# Patient Record
Sex: Female | Born: 1955
Health system: Southern US, Community
[De-identification: ages and names within clinical notes are randomized; demographics above are authoritative.]

## PROBLEM LIST (undated history)

## (undated) DIAGNOSIS — F329 Major depressive disorder, single episode, unspecified: Secondary | ICD-10-CM

## (undated) DIAGNOSIS — H269 Unspecified cataract: Secondary | ICD-10-CM

## (undated) DIAGNOSIS — M1990 Unspecified osteoarthritis, unspecified site: Secondary | ICD-10-CM

## (undated) DIAGNOSIS — K219 Gastro-esophageal reflux disease without esophagitis: Secondary | ICD-10-CM

## (undated) DIAGNOSIS — K279 Peptic ulcer, site unspecified, unspecified as acute or chronic, without hemorrhage or perforation: Secondary | ICD-10-CM

## (undated) DIAGNOSIS — J439 Emphysema, unspecified: Secondary | ICD-10-CM

## (undated) DIAGNOSIS — R7303 Prediabetes: Secondary | ICD-10-CM

## (undated) DIAGNOSIS — J189 Pneumonia, unspecified organism: Secondary | ICD-10-CM

## (undated) DIAGNOSIS — F32A Depression, unspecified: Secondary | ICD-10-CM

## (undated) DIAGNOSIS — F419 Anxiety disorder, unspecified: Secondary | ICD-10-CM

## (undated) DIAGNOSIS — G709 Myoneural disorder, unspecified: Secondary | ICD-10-CM

## (undated) DIAGNOSIS — IMO0001 Reserved for inherently not codable concepts without codable children: Secondary | ICD-10-CM

## (undated) DIAGNOSIS — C189 Malignant neoplasm of colon, unspecified: Principal | ICD-10-CM

## (undated) DIAGNOSIS — D649 Anemia, unspecified: Secondary | ICD-10-CM

## (undated) HISTORY — DX: Gastro-esophageal reflux disease without esophagitis: K21.9

## (undated) HISTORY — DX: Prediabetes: R73.03

## (undated) HISTORY — DX: Anemia, unspecified: D64.9

## (undated) HISTORY — DX: Major depressive disorder, single episode, unspecified: F32.9

## (undated) HISTORY — DX: Depression, unspecified: F32.A

## (undated) HISTORY — DX: Malignant neoplasm of colon, unspecified: C18.9

## (undated) HISTORY — DX: Anxiety disorder, unspecified: F41.9

## (undated) HISTORY — DX: Unspecified cataract: H26.9

## (undated) HISTORY — PX: OTHER SURGICAL HISTORY: SHX169

## (undated) HISTORY — DX: Peptic ulcer, site unspecified, unspecified as acute or chronic, without hemorrhage or perforation: K27.9

## (undated) HISTORY — DX: Emphysema, unspecified: J43.9

## (undated) HISTORY — DX: Unspecified osteoarthritis, unspecified site: M19.90

## (undated) HISTORY — PX: COLONOSCOPY: SHX174

## (undated) HISTORY — PX: EYE SURGERY: SHX253

---

## 1980-07-15 HISTORY — PX: TUBAL LIGATION: SHX77

## 1998-02-19 ENCOUNTER — Emergency Department (HOSPITAL_COMMUNITY): Admission: EM | Admit: 1998-02-19 | Discharge: 1998-02-19 | Payer: Self-pay | Admitting: Emergency Medicine

## 1999-01-27 ENCOUNTER — Emergency Department (HOSPITAL_COMMUNITY): Admission: EM | Admit: 1999-01-27 | Discharge: 1999-01-27 | Payer: Self-pay | Admitting: Emergency Medicine

## 1999-01-27 ENCOUNTER — Encounter: Payer: Self-pay | Admitting: Emergency Medicine

## 1999-09-22 ENCOUNTER — Encounter: Payer: Self-pay | Admitting: Family Medicine

## 1999-09-22 ENCOUNTER — Ambulatory Visit (HOSPITAL_COMMUNITY): Admission: RE | Admit: 1999-09-22 | Discharge: 1999-09-22 | Payer: Self-pay | Admitting: Family Medicine

## 2000-12-11 ENCOUNTER — Encounter: Payer: Self-pay | Admitting: Family Medicine

## 2000-12-11 ENCOUNTER — Ambulatory Visit (HOSPITAL_COMMUNITY): Admission: RE | Admit: 2000-12-11 | Discharge: 2000-12-11 | Payer: Self-pay | Admitting: Family Medicine

## 2001-02-08 ENCOUNTER — Emergency Department (HOSPITAL_COMMUNITY): Admission: EM | Admit: 2001-02-08 | Discharge: 2001-02-08 | Payer: Self-pay | Admitting: *Deleted

## 2003-04-16 ENCOUNTER — Other Ambulatory Visit: Admission: RE | Admit: 2003-04-16 | Discharge: 2003-04-16 | Payer: Self-pay | Admitting: Obstetrics & Gynecology

## 2004-06-22 ENCOUNTER — Emergency Department (HOSPITAL_COMMUNITY): Admission: EM | Admit: 2004-06-22 | Discharge: 2004-06-22 | Payer: Self-pay | Admitting: Emergency Medicine

## 2005-03-24 ENCOUNTER — Other Ambulatory Visit: Admission: RE | Admit: 2005-03-24 | Discharge: 2005-03-24 | Payer: Self-pay | Admitting: Obstetrics and Gynecology

## 2006-04-19 ENCOUNTER — Encounter (INDEPENDENT_AMBULATORY_CARE_PROVIDER_SITE_OTHER): Payer: Self-pay | Admitting: *Deleted

## 2006-04-19 ENCOUNTER — Ambulatory Visit: Payer: Self-pay | Admitting: *Deleted

## 2007-03-22 ENCOUNTER — Other Ambulatory Visit: Admission: RE | Admit: 2007-03-22 | Discharge: 2007-03-22 | Payer: Self-pay | Admitting: Family Medicine

## 2007-04-02 ENCOUNTER — Ambulatory Visit: Payer: Self-pay | Admitting: Internal Medicine

## 2007-04-16 ENCOUNTER — Encounter: Payer: Self-pay | Admitting: Internal Medicine

## 2007-04-16 ENCOUNTER — Ambulatory Visit: Payer: Self-pay | Admitting: Internal Medicine

## 2007-11-15 HISTORY — PX: PLANTAR FASCIA SURGERY: SHX746

## 2008-04-23 ENCOUNTER — Emergency Department (HOSPITAL_COMMUNITY): Admission: EM | Admit: 2008-04-23 | Discharge: 2008-04-24 | Payer: Self-pay | Admitting: Emergency Medicine

## 2008-11-05 ENCOUNTER — Ambulatory Visit: Payer: Self-pay | Admitting: Obstetrics and Gynecology

## 2008-11-26 ENCOUNTER — Ambulatory Visit (HOSPITAL_COMMUNITY): Admission: RE | Admit: 2008-11-26 | Discharge: 2008-11-26 | Payer: Self-pay | Admitting: Obstetrics and Gynecology

## 2008-12-02 ENCOUNTER — Ambulatory Visit (HOSPITAL_COMMUNITY): Admission: RE | Admit: 2008-12-02 | Discharge: 2008-12-02 | Payer: Self-pay | Admitting: Obstetrics & Gynecology

## 2008-12-03 ENCOUNTER — Encounter: Admission: RE | Admit: 2008-12-03 | Discharge: 2008-12-03 | Payer: Self-pay | Admitting: Family Medicine

## 2010-11-14 HISTORY — PX: COLON RESECTION: SHX5231

## 2010-12-05 ENCOUNTER — Encounter: Payer: Self-pay | Admitting: *Deleted

## 2011-04-01 NOTE — Group Therapy Note (Signed)
NAMEJEFFIFER, RABOLD NO.:  [id number]   MEDICAL RECORD NO.:  [id number]          PATIENT TYPE:  WOC   LOCATION:  [facility]                   FACILITY:  [facility]   PHYSICIAN:  Ellis Parents, MD    DATE OF BIRTH:  [birth year]   DATE OF SERVICE:  04/19/2006                                    CLINIC NOTE   This 55 year old gravida 3, para 3, last menstrual period April 16, 2006,  previous menstrual period in January 2007, now presenting complaining of  night sweats of approximately 2 months' duration.  In the past 4 months she  has stopped smoking and has had a significant job disruption.  The hot  flashes are only at night.  There are no night sweats.  She has also sleep  insomnia.  The patient is taking some over-the-counter herbal medications  which she says have helped her a lot.   PAST MEDICAL HISTORY:  Negative.  She has been in good health.  Her last  mammogram was in January 2007.   PHYSICAL EXAMINATION:  External genitalia are normal.  The vagina is clean.  The cervix is well-epithelialized and normal.  The uterus is retroverted,  normal size and mobile.  Both adnexa are soft, without induration, masses or  nodularity.   These symptoms suggest menopausal symptoms but could be related to stress of  discontinuing smoking and job disruption.  The patient was advised that  hormonal medication can be used in circumstances like this if her symptoms  do not abate.  She is to return and we can discuss estrogen replacement  therapy.  A TSH is performed.  Pap smear is performed.           ______________________________  Ellis Parents, MD     SA/MEDQ  D:  04/19/2006  T:  04/20/2006  Job:  [id number]

## 2011-05-30 ENCOUNTER — Other Ambulatory Visit: Payer: Self-pay | Admitting: Gastroenterology

## 2011-05-31 ENCOUNTER — Ambulatory Visit
Admission: RE | Admit: 2011-05-31 | Discharge: 2011-05-31 | Disposition: A | Discharge status: Home / Self Care | Payer: 59 | Source: Ambulatory Visit | Attending: Gastroenterology | Admitting: Gastroenterology

## 2011-05-31 MED ORDER — IOHEXOL 300 MG/ML  SOLN
100.0000 mL | Freq: Once | INTRAMUSCULAR | Status: AC | PRN
  Administered 2011-05-31: 100 mL via INTRAVENOUS

## 2011-06-09 ENCOUNTER — Ambulatory Visit (INDEPENDENT_AMBULATORY_CARE_PROVIDER_SITE_OTHER): Payer: 59 | Admitting: General Surgery

## 2011-06-09 ENCOUNTER — Encounter (INDEPENDENT_AMBULATORY_CARE_PROVIDER_SITE_OTHER): Payer: Self-pay | Admitting: General Surgery

## 2011-06-09 DIAGNOSIS — M79609 Pain in unspecified limb: Secondary | ICD-10-CM

## 2011-06-09 DIAGNOSIS — C180 Malignant neoplasm of cecum: Secondary | ICD-10-CM

## 2011-06-09 DIAGNOSIS — M79662 Pain in left lower leg: Secondary | ICD-10-CM

## 2011-06-09 NOTE — Assessment & Plan Note (Signed)
Ordering BLE doppler studies to rule out DVT in this new cancer pt.

## 2011-06-09 NOTE — Progress Notes (Signed)
Teresa Morgan is a 55 y.o. female.    Chief Complaint  Patient presents with  . Other    eval colon mass    HPI HPI Pt presented with blood in stool at the beginning of July.  She had screening colonoscopy that was normal at age 41.  She denies weight loss or changes in her stool caliber or bowel habits.  She has had an occasional twinge of abdominal discomfort, but no abdominal pain.  She has not been fatigued or lightheaded.  She has been able to work.  She denies nausea or vomiting.  She does have a history of hemorrhoids, but these have not been inflamed.  She had a cousin who had stage IV colon cancer.   Past Medical History  Diagnosis Date  . Anemia     Past Surgical History  Procedure Date  . Tubal ligation   . Heel spurs   . Hemrrhoids     Family History  Problem Relation Age of Onset  . Diabetes Mother     Social History History  Substance Use Topics  . Smoking status: Former Smoker    Quit date: 06/08/2006  . Smokeless tobacco: Not on file  . Alcohol Use: Yes    Allergies  Allergen Reactions  . Aspirin     Upset stomach    Current Outpatient Prescriptions  Medication Sig Dispense Refill  . HYDROcodone-acetaminophen (LORTAB) 7.5-500 MG per tablet Take 1 tablet by mouth as needed.        . methocarbamol (ROBAXIN) 500 MG tablet Take 500 mg by mouth as needed.          Review of Systems Review of Systems  Constitutional: Negative.   HENT: Negative.   Eyes: Negative.   Respiratory: Negative.   Cardiovascular: Negative.   Gastrointestinal: Positive for blood in stool.  Genitourinary: Negative.   Musculoskeletal: Negative.   Skin: Negative.   Neurological: Negative.   Endo/Heme/Allergies: Negative.   Psychiatric/Behavioral: Negative.     Physical Exam Physical Exam  Constitutional: She is oriented to person, place, and time. She appears well-developed and well-nourished. No distress.  HENT:  Head: Normocephalic and atraumatic.  Mouth/Throat:  No oropharyngeal exudate.  Eyes: Conjunctivae are normal. Pupils are equal, round, and reactive to light. No scleral icterus.  Neck: Normal range of motion. Neck supple. No JVD present. No tracheal deviation present. No thyromegaly present.  Cardiovascular: Normal rate, regular rhythm and intact distal pulses.   Respiratory: No respiratory distress. She exhibits no tenderness.  GI: Soft. Bowel sounds are normal. She exhibits no distension and no mass. There is no tenderness. There is no rebound and no guarding.  Musculoskeletal: Normal range of motion. She exhibits no edema and no tenderness.  Lymphadenopathy:    She has no cervical adenopathy.  Neurological: She is alert and oriented to person, place, and time. Coordination normal.  Skin: Skin is warm and dry. No rash noted. She is not diaphoretic. No erythema. No pallor.  Psychiatric: She has a normal mood and affect. Her behavior is normal. Judgment and thought content normal.     Blood pressure 128/82, pulse 80, temperature 97 F (36.1 C), height 5\' 9"  (1.753 m), weight 159 lb 12.8 oz (72.485 kg).  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 Reviewed actual surgery and timing with placement of incisions,  etc. Reviewed need for pathology before determining if any chemotherapy is needed.   Pt has trip planned August 20.  Will do as soon as pt is back.    Bilateral calf pain Ordering BLE doppler studies to rule out DVT in this new cancer pt.      Gilbert Narain 06/09/2011, 4:56 PM

## 2011-06-09 NOTE — Assessment & Plan Note (Signed)
Laparoscopic R hemicolectomy Entereg protocol One day brief bowel prep Reviewed risks of surgery with bleeding, infection, damage to other structures, leak with possible ostomy, hernia, and possible prolonged hospital stay. Reviewed actual surgery and timing with placement of incisions, etc. Reviewed need for pathology before determining if any chemotherapy is needed.   Pt has trip planned August 20.  Will do as soon as pt is back.

## 2011-06-15 ENCOUNTER — Ambulatory Visit (HOSPITAL_COMMUNITY)
Admission: RE | Admit: 2011-06-15 | Discharge: 2011-06-15 | Disposition: A | Discharge status: Home / Self Care | Payer: 59 | Source: Ambulatory Visit | Attending: General Surgery | Admitting: General Surgery

## 2011-06-15 DIAGNOSIS — R252 Cramp and spasm: Secondary | ICD-10-CM | POA: Insufficient documentation

## 2011-06-15 DIAGNOSIS — M79609 Pain in unspecified limb: Secondary | ICD-10-CM | POA: Insufficient documentation

## 2011-06-27 ENCOUNTER — Other Ambulatory Visit (INDEPENDENT_AMBULATORY_CARE_PROVIDER_SITE_OTHER): Payer: Self-pay | Admitting: General Surgery

## 2011-06-27 ENCOUNTER — Ambulatory Visit (HOSPITAL_COMMUNITY)
Admission: RE | Admit: 2011-06-27 | Discharge: 2011-06-27 | Disposition: A | Discharge status: Home / Self Care | Payer: 59 | Source: Ambulatory Visit | Attending: General Surgery | Admitting: General Surgery

## 2011-06-27 ENCOUNTER — Encounter (HOSPITAL_COMMUNITY)
Admission: RE | Admit: 2011-06-27 | Discharge: 2011-06-27 | Disposition: A | Discharge status: Home / Self Care | Payer: 59 | Source: Ambulatory Visit | Attending: General Surgery | Admitting: General Surgery

## 2011-06-27 DIAGNOSIS — C189 Malignant neoplasm of colon, unspecified: Secondary | ICD-10-CM

## 2011-06-27 DIAGNOSIS — Z01818 Encounter for other preprocedural examination: Secondary | ICD-10-CM | POA: Insufficient documentation

## 2011-06-27 DIAGNOSIS — Z01812 Encounter for preprocedural laboratory examination: Secondary | ICD-10-CM | POA: Insufficient documentation

## 2011-06-27 LAB — CBC
MCH: 21.5 pg — ABNORMAL LOW (ref 26.0–34.0)
RBC: 5.11 MIL/uL (ref 3.87–5.11)
WBC: 6.1 10*3/uL (ref 4.0–10.5)

## 2011-06-27 LAB — TYPE AND SCREEN
ABO/RH(D): B POS
Antibody Screen: NEGATIVE

## 2011-06-27 LAB — SURGICAL PCR SCREEN
MRSA, PCR: NEGATIVE
Staphylococcus aureus: NEGATIVE

## 2011-06-27 LAB — COMPREHENSIVE METABOLIC PANEL
Albumin: 3.8 g/dL (ref 3.5–5.2)
BUN: 14 mg/dL (ref 6–23)
Creatinine, Ser: 0.71 mg/dL (ref 0.50–1.10)
Total Protein: 6.9 g/dL (ref 6.0–8.3)

## 2011-06-27 LAB — DIFFERENTIAL
Basophils Absolute: 0 10*3/uL (ref 0.0–0.1)
Eosinophils Absolute: 0.2 10*3/uL (ref 0.0–0.7)
Lymphocytes Relative: 26 % (ref 12–46)
Neutrophils Relative %: 65 % (ref 43–77)

## 2011-06-27 LAB — PROTIME-INR
INR: 1.03 (ref 0.00–1.49)
Prothrombin Time: 13.7 seconds (ref 11.6–15.2)

## 2011-07-07 ENCOUNTER — Other Ambulatory Visit (INDEPENDENT_AMBULATORY_CARE_PROVIDER_SITE_OTHER): Payer: Self-pay | Admitting: General Surgery

## 2011-07-07 ENCOUNTER — Inpatient Hospital Stay (HOSPITAL_COMMUNITY)
Admission: RE | Admit: 2011-07-07 | Discharge: 2011-07-11 | DRG: 330 | Disposition: A | Discharge status: Home / Self Care | Payer: 59 | Source: Ambulatory Visit | Attending: General Surgery | Admitting: General Surgery

## 2011-07-07 DIAGNOSIS — Z87891 Personal history of nicotine dependence: Secondary | ICD-10-CM

## 2011-07-07 DIAGNOSIS — D62 Acute posthemorrhagic anemia: Secondary | ICD-10-CM | POA: Diagnosis not present

## 2011-07-07 DIAGNOSIS — C180 Malignant neoplasm of cecum: Principal | ICD-10-CM | POA: Diagnosis present

## 2011-07-07 DIAGNOSIS — Z01818 Encounter for other preprocedural examination: Secondary | ICD-10-CM

## 2011-07-07 DIAGNOSIS — Z01812 Encounter for preprocedural laboratory examination: Secondary | ICD-10-CM

## 2011-07-07 DIAGNOSIS — Z01810 Encounter for preprocedural cardiovascular examination: Secondary | ICD-10-CM

## 2011-07-07 HISTORY — PX: APPENDECTOMY: SHX54

## 2011-07-08 LAB — CBC
MCH: 20.6 pg — ABNORMAL LOW (ref 26.0–34.0)
MCV: 66.4 fL — ABNORMAL LOW (ref 78.0–100.0)
Platelets: 127 10*3/uL — ABNORMAL LOW (ref 150–400)
RDW: 15.7 % — ABNORMAL HIGH (ref 11.5–15.5)

## 2011-07-08 LAB — BASIC METABOLIC PANEL
CO2: 27 mEq/L (ref 19–32)
Calcium: 8.2 mg/dL — ABNORMAL LOW (ref 8.4–10.5)
Creatinine, Ser: 0.64 mg/dL (ref 0.50–1.10)
GFR calc Af Amer: >60 mL/min (ref >60)

## 2011-07-08 LAB — MAGNESIUM: Magnesium: 2 mg/dL (ref 1.5–2.5)

## 2011-07-09 LAB — BASIC METABOLIC PANEL
BUN: 5 mg/dL — ABNORMAL LOW (ref 6–23)
Calcium: 8.6 mg/dL (ref 8.4–10.5)
Creatinine, Ser: 0.75 mg/dL (ref 0.50–1.10)
GFR calc non Af Amer: >60 mL/min (ref >60)
Glucose, Bld: 131 mg/dL — ABNORMAL HIGH (ref 70–99)
Sodium: 140 mEq/L (ref 135–145)

## 2011-07-09 LAB — CBC
Hemoglobin: 9.1 g/dL — ABNORMAL LOW (ref 12.0–15.0)
MCH: 21.2 pg — ABNORMAL LOW (ref 26.0–34.0)
MCHC: 31.7 g/dL (ref 30.0–36.0)

## 2011-07-13 ENCOUNTER — Telehealth (INDEPENDENT_AMBULATORY_CARE_PROVIDER_SITE_OTHER): Payer: Self-pay | Admitting: General Surgery

## 2011-07-13 NOTE — Telephone Encounter (Signed)
Discussed pathology w pt.  Pt is pT3N2.  Will need to see Dr. Myna Hidalgo for chemotherapy.  Have made referral to his office.  We will likely need to schedule port a cath.  Will let him see her and discuss before placing.

## 2011-07-14 ENCOUNTER — Telehealth (INDEPENDENT_AMBULATORY_CARE_PROVIDER_SITE_OTHER): Payer: Self-pay | Admitting: General Surgery

## 2011-07-14 NOTE — Op Note (Signed)
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  POSTOPERATIVE DIAGNOSIS:  Cecal adenocarcinoma, clinical T1-3 N0 M0.  PROCEDURE:  Laparoscopic hemicolectomy, On-Q pain pump.  SURGEON:  Almond Lint, MD  ASSISTANT:  Anselm Pancoast. Zachery Dakins, MD  ANESTHESIA:  General and local.  FINDINGS:  Cecal mass.  SPECIMEN:  Right colon to Pathology.  ESTIMATED BLOOD LOSS:  Minimal.  COMPLICATIONS:  None known.  PROCEDURE IN DETAIL:  [name] was identified in the holding area and taken to the operating room where she was placed on the operating room table.  General anesthesia was induced.  Her arms were tucked bilaterally.  Her abdomen was prepped and draped in sterile fashion. Time-out was performed according to the surgical safety check list. When all was correct, we continued.  The infraumbilical skin was anesthetized with local anesthetic and a vertical incision was made with a #11 blade.  The subcutaneous tissues were spread with a Kelly and the fascia was elevated with two Kocher clamps.  This was incised in the midline with a #11 blade.  A Tresa Endo was used to confirm entrance into the peritoneal cavity.  The pursestring suture was placed around the fascial incision with a 0 Vicryl.  Pneumoperitoneum was achieved to a pressure of 15 mmHg.  The patient was placed into Trendelenburg position and rotated to the left.  Two 5-mm trocars were placed in the midline, one in the lower midline and one in the upper midline.  This was after administration of local under direct visualization.  The appendix was visualized and the right colon.  There were some  adhesions of the appendix to the posterior abdominal wall.  These were taken down with enseal.  The colon was left medially.  There were quite rigid attachments to the retroperitoneum on the right were fairly filmy.  These were taken down with the enseal. The hepatic flexure was taken down with the enseal.  Care was taken to avoid injuring the mesocolon or the duodenum.  The omentum was also freed up off the proximal transverse colon.  Once this was adequately mobilized, a small incision was made incorporating the [facility] trocar and the lower midline trocar.  We made incision with #10 blade and the subcutaneous tissues and fascia were opened with cautery.  The Alexis wound protector was placed in the wound.  The colon was brought into the wound and was adequately immobilized.  Appropriate site of division was located at the terminal ileum and the transverse colon.  The colon was isolated from the mesentery and divided with a GIA 75 stapler.  A similar incision was done on the terminal ileum.  These were placed together and a stay suture was placed to hold the two ends either.  The tinea of colon was sutured to the small bowel  around 7 cm beyond where they were transected.  The ends of bowel were opened and the GIA was fired across this limb and the bowel to create a side-to-side functional end-to-end anastomosis.  Care was taken to make sure the bowel was not twisted and the mesentery was not incorporated into the staple line.  The staple line was then examined and was seen to be intact without evidence of bleeding.  The defect was then closed with the TA-60 stapler.  The mesenteric defect was reapproximated with interrupted 2-0 Vicryl figure- of-eight sutures.  This was allowed to fall back into the abdomen.  The abdomen was irrigated with saline.  The fascia was then closed with #0 loop PDS suture x2.  Pneumoperitoneum was reachieved, and there was no evidence of twisting or herniation of  the bowel and no evidence of blood pulling in any of the abdominal sites.  The skin was then irrigated. The On-Q tunnelers were placed.  They were placed in the preperitoneal space.  The skin of all the incisions was closed with 4-0 Monocryl in subcuticular fashion.  The On-Q catheters were advanced through the tunneler sheath and the tunneler sheaths were removed.  Catheters were bolused.  The wounds were dressed with benzoin and Steri-Strips and covered in a dressing.  The catheters were secured with Tegaderm.  The patient was awaken from anesthesia and taken to the PACU in stable condition.  Needle, sponge, and instrument counts were correct x2.     Almond Lint, MD     FB/MEDQ  D:  07/07/2011  T:  07/07/2011  Job:  [id number]  Electronically Signed by Almond Lint MD on 07/14/2011 02:34:20 PM

## 2011-07-15 ENCOUNTER — Other Ambulatory Visit (INDEPENDENT_AMBULATORY_CARE_PROVIDER_SITE_OTHER): Payer: Self-pay | Admitting: General Surgery

## 2011-07-15 ENCOUNTER — Telehealth (INDEPENDENT_AMBULATORY_CARE_PROVIDER_SITE_OTHER): Payer: Self-pay | Admitting: General Surgery

## 2011-07-15 DIAGNOSIS — C801 Malignant (primary) neoplasm, unspecified: Secondary | ICD-10-CM

## 2011-07-19 ENCOUNTER — Ambulatory Visit: Payer: 59 | Admitting: Hematology & Oncology

## 2011-07-27 ENCOUNTER — Other Ambulatory Visit (INDEPENDENT_AMBULATORY_CARE_PROVIDER_SITE_OTHER): Payer: Self-pay | Admitting: General Surgery

## 2011-07-27 ENCOUNTER — Telehealth (INDEPENDENT_AMBULATORY_CARE_PROVIDER_SITE_OTHER): Payer: Self-pay

## 2011-07-27 DIAGNOSIS — R52 Pain, unspecified: Secondary | ICD-10-CM

## 2011-07-27 DIAGNOSIS — R509 Fever, unspecified: Secondary | ICD-10-CM

## 2011-07-27 NOTE — Telephone Encounter (Signed)
Pt called c/o severe abdominal cramping and low grade fevers at night.  States the cramping lasted for approximately 24 hours ending yesterday, and she feels better today.  She is still having night fevers.  Dr. Derrell Lolling recommended a CBC w/ diff and UA w/ culture.  Pt will go for labwork today.

## 2011-07-28 LAB — CBC WITH DIFFERENTIAL/PLATELET

## 2011-07-28 LAB — URINALYSIS
Bilirubin Urine: NEGATIVE
Glucose, UA: NEGATIVE mg/dL
Ketones, ur: NEGATIVE mg/dL
Protein, ur: NEGATIVE mg/dL
Urobilinogen, UA: 0.2 mg/dL (ref 0.0–1.0)

## 2011-07-29 ENCOUNTER — Telehealth (INDEPENDENT_AMBULATORY_CARE_PROVIDER_SITE_OTHER): Payer: Self-pay

## 2011-07-29 LAB — URINE CULTURE: Organism ID, Bacteria: NO GROWTH

## 2011-07-29 NOTE — Telephone Encounter (Signed)
LMOM for pt to call for her lab results.  Informed her if she was no better to call the doctor on call this weekend, and I would check on her again Monday.  Her labs came back fairly normal, and were reviewed for me by Dr. Daphine Deutscher.

## 2011-08-01 NOTE — Discharge Summary (Signed)
  NAMEMATIKA, BARTELL                 ACCOUNT NO.:  [id number]  MEDICAL RECORD NO.:  [id number]  LOCATION:  5152                         FACILITY:  [facility]  PHYSICIAN:  Almond Lint, MD       DATE OF BIRTH:  1956-07-25  DATE OF ADMISSION:  07/07/2011 DATE OF DISCHARGE:  07/11/2011                              DISCHARGE SUMMARY   FINAL PRINCIPAL DIAGNOSIS:  Adenocarcinoma of the cecum, clinical T2, N0, Mx.  PRINCIPAL PROCEDURE:  Laparoscopic right hemicolectomy on July 07, 2011.  PRINCIPAL LABORATORIES:  Hematocrit prior to discharge is 28.7. Electrolytes showed a mildly elevated glucose of 119, otherwise essentially within normal limits.  DISCHARGE MEDICATIONS: 1. Fiber over-the-counter supplement. 2. Calcium carbonate/vitamin D. 3. Vitamin E. 4. Multivitamin. 5. Methocarbamol 500 mg every 6 hours as needed. 6. Hydrocodone/acetaminophen 7.5/325 every 6 hours as needed for pain.  HOSPITAL COURSE:  Ms. Piscopo was admitted to the hospital following her laparoscopic right hemicolectomy.  She did receive a bolus of lactated Ringers overnight for low blood pressure.  Her CBC did show that her hematocrit dropped somewhat down to 26.3.  She had a preexisting hematocrit around 33, so this was not a big drop.  This was watched. She had pain control with the PCA and On-Q pain pump.  Over the weekend, her bowel function improved on indirect protocol and she was able to be transitioned to a regular diet.  Her Foley catheter was removed without difficulty and she was able to void spontaneously.  She was able to ambulate without assistance.  She is discharged to home in improved condition.  We are still waiting for her pathology report at the time of discharge.  She is not to do any heavy lifting for 6-8 weeks.  She is to not drive for 2-4 weeks depending on her level of narcotics and comfort. She can eat as tolerated and should avoid constipation.     Almond Lint, MD     FB/MEDQ   D:  07/14/2011  T:  07/14/2011  Job:  [id number]  Electronically Signed by Almond Lint MD on 08/01/2011 07:38:30 AM

## 2011-08-02 ENCOUNTER — Ambulatory Visit (INDEPENDENT_AMBULATORY_CARE_PROVIDER_SITE_OTHER): Payer: 59 | Admitting: General Surgery

## 2011-08-02 VITALS — BP 98/66 | HR 68 | Temp 96.9°F | Resp 18 | Ht 69.0 in | Wt 158.8 lb

## 2011-08-02 DIAGNOSIS — C180 Malignant neoplasm of cecum: Secondary | ICD-10-CM

## 2011-08-02 MED ORDER — HYDROCODONE-ACETAMINOPHEN 7.5-500 MG PO TABS: 1.0000 | ORAL_TABLET | Freq: Four times a day (QID) | ORAL | Status: DC | PRN

## 2011-08-02 NOTE — Patient Instructions (Signed)
No aspirin products between now and port a cath placement.

## 2011-08-02 NOTE — Progress Notes (Signed)
HISTORY: Patient is doing very well.  Her energy level is returning to normal.  She had fevers last week and a U/A was performed which was negative.  CBC was not performed for unclear reasons.  In any event, she is feeling better now.  She denies nausea/vomiting.  She is no longer having fevers.  She denies issues with her bowel movements.     PERTINENT REVIEW OF SYSTEMS: Otherwise negative times 12 points.     EXAM: Head: Normocephalic and atraumatic.  Eyes:  Conjunctivae are normal. Pupils are equal, round, and reactive to light. No scleral icterus.  Neck:  Normal range of motion. Neck supple. No tracheal deviation present. No thyromegaly present.  Resp: No respiratory distress, normal effort. Abd: Soft. Incisions are well healed.  Abdomen is soft, non distended and non tender No masses are palpable.  There is no rebound and no guarding.  Neurological: Alert and oriented to person, place, and time. Coordination normal.  Skin: Skin is warm and dry. No rash noted. No diaphoretic. No erythema. No pallor.  Psychiatric: Normal mood and affect. Normal behavior. Judgment and thought content normal.     Pathology reviewed and demonstrates: See below. 4/17 nodes positive.  ASSESSMENT AND PLAN: Cecal cancer, pT3N2a Appt with Dr. Myna Hidalgo tomorrow. Schedule port a cath placement Follow up with me in 3 months otherwise.   Doing well after lap R colon.       Maudry Diego, MD Surgical Oncology, General & Endocrine Surgery [facility]  Provider Not In System Almond Lint, MD

## 2011-08-02 NOTE — Assessment & Plan Note (Signed)
Appt with Dr. Myna Hidalgo tomorrow. Schedule port a cath placement Follow up with me in 3 months otherwise.   Doing well after lap R colon.

## 2011-08-03 ENCOUNTER — Other Ambulatory Visit: Payer: Self-pay | Admitting: Hematology & Oncology

## 2011-08-03 ENCOUNTER — Encounter (HOSPITAL_BASED_OUTPATIENT_CLINIC_OR_DEPARTMENT_OTHER): Payer: 59 | Admitting: Hematology & Oncology

## 2011-08-03 DIAGNOSIS — C775 Secondary and unspecified malignant neoplasm of intrapelvic lymph nodes: Secondary | ICD-10-CM

## 2011-08-03 DIAGNOSIS — C180 Malignant neoplasm of cecum: Secondary | ICD-10-CM

## 2011-08-03 LAB — PREALBUMIN: Prealbumin: 24 mg/dL (ref 17.0–34.0)

## 2011-08-03 LAB — CBC WITH DIFFERENTIAL (CANCER CENTER ONLY)
BASO%: 0.2 % (ref 0.0–2.0)
Eosinophils Absolute: 0.1 10*3/uL (ref 0.0–0.5)
HCT: 33.1 % — ABNORMAL LOW (ref 34.8–46.6)
LYMPH%: 32.2 % (ref 14.0–48.0)
MCH: 20.6 pg — ABNORMAL LOW (ref 26.0–34.0)
MCV: 63 fL — ABNORMAL LOW (ref 81–101)
MONO%: 5.1 % (ref 0.0–13.0)
NEUT%: 60.1 % (ref 39.6–80.0)
Platelets: 261 10*3/uL (ref 145–400)
RDW: 16.2 % — ABNORMAL HIGH (ref 11.1–15.7)

## 2011-08-03 LAB — TECHNOLOGIST REVIEW CHCC SATELLITE

## 2011-08-03 LAB — COMPREHENSIVE METABOLIC PANEL
AST: 14 U/L (ref 0–37)
Alkaline Phosphatase: 72 U/L (ref 39–117)
BUN: 14 mg/dL (ref 6–23)
Glucose, Bld: 108 mg/dL — ABNORMAL HIGH (ref 70–99)
Sodium: 140 mEq/L (ref 135–145)
Total Bilirubin: 0.2 mg/dL — ABNORMAL LOW (ref 0.3–1.2)

## 2011-08-03 LAB — CEA: CEA: 1.1 ng/mL (ref 0.0–5.0)

## 2011-08-04 ENCOUNTER — Ambulatory Visit (HOSPITAL_COMMUNITY)
Admission: RE | Admit: 2011-08-04 | Discharge: 2011-08-04 | Disposition: A | Discharge status: Home / Self Care | Payer: 59 | Source: Ambulatory Visit | Attending: General Surgery | Admitting: General Surgery

## 2011-08-04 ENCOUNTER — Ambulatory Visit (HOSPITAL_COMMUNITY): Payer: 59

## 2011-08-04 DIAGNOSIS — C189 Malignant neoplasm of colon, unspecified: Secondary | ICD-10-CM

## 2011-08-04 LAB — CBC
MCH: 19.7 pg — ABNORMAL LOW (ref 26.0–34.0)
MCHC: 30.6 g/dL (ref 30.0–36.0)
Platelets: 182 10*3/uL (ref 150–400)
RBC: 5.34 MIL/uL — ABNORMAL HIGH (ref 3.87–5.11)

## 2011-08-04 LAB — SURGICAL PCR SCREEN
MRSA, PCR: NEGATIVE
Staphylococcus aureus: NEGATIVE

## 2011-08-10 ENCOUNTER — Encounter (HOSPITAL_BASED_OUTPATIENT_CLINIC_OR_DEPARTMENT_OTHER): Payer: 59 | Admitting: Hematology & Oncology

## 2011-08-10 DIAGNOSIS — Z5111 Encounter for antineoplastic chemotherapy: Secondary | ICD-10-CM

## 2011-08-10 DIAGNOSIS — C775 Secondary and unspecified malignant neoplasm of intrapelvic lymph nodes: Secondary | ICD-10-CM

## 2011-08-10 DIAGNOSIS — C180 Malignant neoplasm of cecum: Secondary | ICD-10-CM

## 2011-08-11 LAB — RAPID URINE DRUG SCREEN, HOSP PERFORMED
Barbiturates: NOT DETECTED
Benzodiazepines: NOT DETECTED
Cocaine: NOT DETECTED

## 2011-08-11 LAB — POCT I-STAT, CHEM 8
Creatinine, Ser: 1.1
Glucose, Bld: 125 — ABNORMAL HIGH
Hemoglobin: 14.3
TCO2: 26

## 2011-08-11 LAB — DIFFERENTIAL
Basophils Absolute: 0
Lymphocytes Relative: 25
Neutro Abs: 5.1
Neutrophils Relative %: 68

## 2011-08-11 LAB — CBC
Platelets: 181
RDW: 15.4

## 2011-08-11 LAB — ETHANOL: Alcohol, Ethyl (B): <5

## 2011-08-12 ENCOUNTER — Encounter (HOSPITAL_BASED_OUTPATIENT_CLINIC_OR_DEPARTMENT_OTHER): Payer: 59 | Admitting: Hematology & Oncology

## 2011-08-25 ENCOUNTER — Other Ambulatory Visit: Payer: Self-pay | Admitting: Hematology & Oncology

## 2011-08-25 ENCOUNTER — Encounter (HOSPITAL_BASED_OUTPATIENT_CLINIC_OR_DEPARTMENT_OTHER): Payer: 59 | Admitting: Hematology & Oncology

## 2011-08-25 DIAGNOSIS — C775 Secondary and unspecified malignant neoplasm of intrapelvic lymph nodes: Secondary | ICD-10-CM

## 2011-08-25 DIAGNOSIS — Z5111 Encounter for antineoplastic chemotherapy: Secondary | ICD-10-CM

## 2011-08-25 DIAGNOSIS — D649 Anemia, unspecified: Secondary | ICD-10-CM

## 2011-08-25 DIAGNOSIS — C180 Malignant neoplasm of cecum: Secondary | ICD-10-CM

## 2011-08-25 LAB — COMPREHENSIVE METABOLIC PANEL
AST: 15 U/L (ref 0–37)
Albumin: 4.1 g/dL (ref 3.5–5.2)
Alkaline Phosphatase: 74 U/L (ref 39–117)
BUN: 15 mg/dL (ref 6–23)
Creatinine, Ser: 0.76 mg/dL (ref 0.50–1.10)
Potassium: 4.2 mEq/L (ref 3.5–5.3)
Total Bilirubin: 0.2 mg/dL — ABNORMAL LOW (ref 0.3–1.2)

## 2011-08-25 LAB — IRON AND TIBC
%SAT: 12 % — ABNORMAL LOW (ref 20–55)
Iron: 40 ug/dL — ABNORMAL LOW (ref 42–145)
TIBC: 336 ug/dL (ref 250–470)
UIBC: 296 ug/dL (ref 125–400)

## 2011-08-25 LAB — CBC WITH DIFFERENTIAL (CANCER CENTER ONLY)
BASO#: 0 10*3/uL (ref 0.0–0.2)
EOS%: 3.2 % (ref 0.0–7.0)
HGB: 11 g/dL — ABNORMAL LOW (ref 11.6–15.9)
LYMPH%: 28.9 % (ref 14.0–48.0)
MCH: 20.3 pg — ABNORMAL LOW (ref 26.0–34.0)
MCHC: 32.9 g/dL (ref 32.0–36.0)
MCV: 62 fL — ABNORMAL LOW (ref 81–101)
MONO%: 8.5 % (ref 0.0–13.0)
NEUT#: 2.9 10*3/uL (ref 1.5–6.5)

## 2011-08-27 ENCOUNTER — Encounter (HOSPITAL_BASED_OUTPATIENT_CLINIC_OR_DEPARTMENT_OTHER): Payer: 59 | Admitting: Oncology

## 2011-08-30 ENCOUNTER — Encounter: Payer: Self-pay | Admitting: *Deleted

## 2011-08-30 NOTE — Op Note (Signed)
Teresa Morgan, Teresa Morgan                 ACCOUNT NO.:  [id number]  MEDICAL RECORD NO.:  [id number]  LOCATION:  DAYL                         FACILITY:  [facility]  PHYSICIAN:  Almond Lint, MD       DATE OF BIRTH:  09-23-1956  DATE OF PROCEDURE:  08/04/2011 DATE OF DISCHARGE:                              OPERATIVE REPORT   PREOPERATIVE DIAGNOSIS:  Colon cancer, G3, 2, and 0.  POSTOPERATIVE DIAGNOSIS:  Colon cancer, G3, 2, and 0.  PROCEDURE:  Port-A-Cath, left subclavian vein, Bard PowerPort, MRI safe, 8-French.  SURGEON:  Almond Lint, MD  ASSISTANT:  None.  ANESTHESIA:  General and local.  FINDINGS:  Good venous return and easy flush.  ESTIMATED BLOOD LOSS:  Minimal.  COMPLICATIONS:  None known.  PROCEDURE:  [name] was identified in the holding area and taken to operating room where she was placed supine on the operating room table. MAC anesthesia was induced.  Her chest and neck were prepped and draped in sterile fashion.  A shoulder roll had been placed previously.  She was placed into Trendelenburg position.  Time-out was performed according to the surgical safety check list.  When all was correct, we continued.  The infraclavicular skin was anesthetized with local anesthetic.  The subclavian vein was accessed with one stick.  The wire threaded easily without ectopy.  Fluoroscopy was used to confirm entrance into the venous system.  A port site was selected, slightly inferiorly and this was anesthetized liberally with local anesthetic.   A 3 cm incision was made transversely with #15 blade.  The subcutaneous tissues were divided with cautery.  This was taken down to the pectoralis fascia.  A pocket was created by elevating the skin and subcutaneous tissues with the [employer].  The port was then placed into the pocket to make sure there is adequate space.  The catheter had been pre-attached to the port.  It also had been flush.  Four 2-0 Prolene sutures were used to secure  the port down to the fascia.  These were not tied down at that point.  Fluoroscopy was used to compare the catheter against the wire and select an appropriate length.  This was transected at 24 cm.  Under fluoroscopic guidance, the dilator and tunneler sheath were advanced over the wire into the subclavian vein.  The dilator and wire were removed, and the catheter was advanced through the tunneler sheath.  The tunneler sheath was pulled away.  There is good venous return and easy flush of the port.  The sutures were then tied down. The 3-0 Vicryl was used to close the subcutaneous skin in interrupted deep dermal lights.  Fluoroscopy was used to reassess length of the catheter and it appeared to be at the SVC-RA junction.  The skin was then closed using 4-0 Monocryl in subcuticular fashion.  Concentrated flush was then administered in a volume of 4 cc.  Again, there was good venous return and easy flush.  The skin was cleaned, dried, and dressed with Dermabond. The catheter was left un-accessed as the patient did not have chemotherapy for around a week.  The patient tolerated the procedure well.  She  was allowed to emerge from anesthesia and taken to PACU in stable condition.  Needle, sponge, and instrument counts were correct.     Almond Lint, MD     FB/MEDQ  D:  08/04/2011  T:  08/04/2011  Job:  [id number]  Electronically Signed by Almond Lint MD on 08/30/2011 11:57:49 AM

## 2011-09-07 ENCOUNTER — Encounter: Payer: Self-pay | Admitting: *Deleted

## 2011-09-08 ENCOUNTER — Other Ambulatory Visit: Payer: Self-pay | Admitting: Hematology & Oncology

## 2011-09-08 ENCOUNTER — Encounter (HOSPITAL_BASED_OUTPATIENT_CLINIC_OR_DEPARTMENT_OTHER): Payer: 59 | Admitting: Hematology & Oncology

## 2011-09-08 DIAGNOSIS — C180 Malignant neoplasm of cecum: Secondary | ICD-10-CM

## 2011-09-08 DIAGNOSIS — Z5111 Encounter for antineoplastic chemotherapy: Secondary | ICD-10-CM

## 2011-09-08 DIAGNOSIS — C775 Secondary and unspecified malignant neoplasm of intrapelvic lymph nodes: Secondary | ICD-10-CM

## 2011-09-08 LAB — COMPREHENSIVE METABOLIC PANEL
ALT: 14 U/L (ref 0–35)
Albumin: 4.3 g/dL (ref 3.5–5.2)
CO2: 20 mEq/L (ref 19–32)
Glucose, Bld: 127 mg/dL — ABNORMAL HIGH (ref 70–99)
Potassium: 4 mEq/L (ref 3.5–5.3)
Sodium: 140 mEq/L (ref 135–145)
Total Bilirubin: 0.4 mg/dL (ref 0.3–1.2)
Total Protein: 6.9 g/dL (ref 6.0–8.3)

## 2011-09-08 LAB — CBC WITH DIFFERENTIAL (CANCER CENTER ONLY)
BASO%: 0.4 % (ref 0.0–2.0)
Eosinophils Absolute: 0.2 10*3/uL (ref 0.0–0.5)
LYMPH#: 1.2 10*3/uL (ref 0.9–3.3)
MONO#: 0.5 10*3/uL (ref 0.1–0.9)
NEUT#: 2.9 10*3/uL (ref 1.5–6.5)
Platelets: 152 10*3/uL (ref 145–400)
RBC: 5.18 10*6/uL (ref 3.70–5.32)
RDW: 21.6 % — ABNORMAL HIGH (ref 11.1–15.7)
WBC: 4.8 10*3/uL (ref 3.9–10.0)

## 2011-09-10 ENCOUNTER — Encounter: Payer: 59 | Admitting: Oncology

## 2011-09-21 ENCOUNTER — Other Ambulatory Visit: Payer: Self-pay | Admitting: Hematology & Oncology

## 2011-09-21 DIAGNOSIS — C180 Malignant neoplasm of cecum: Secondary | ICD-10-CM

## 2011-09-22 ENCOUNTER — Ambulatory Visit (HOSPITAL_BASED_OUTPATIENT_CLINIC_OR_DEPARTMENT_OTHER): Payer: 59 | Admitting: Hematology & Oncology

## 2011-09-22 ENCOUNTER — Other Ambulatory Visit (HOSPITAL_BASED_OUTPATIENT_CLINIC_OR_DEPARTMENT_OTHER): Payer: 59 | Admitting: Lab

## 2011-09-22 ENCOUNTER — Other Ambulatory Visit: Payer: Self-pay | Admitting: Hematology & Oncology

## 2011-09-22 ENCOUNTER — Ambulatory Visit (HOSPITAL_BASED_OUTPATIENT_CLINIC_OR_DEPARTMENT_OTHER): Payer: 59

## 2011-09-22 DIAGNOSIS — C189 Malignant neoplasm of colon, unspecified: Secondary | ICD-10-CM

## 2011-09-22 DIAGNOSIS — C180 Malignant neoplasm of cecum: Secondary | ICD-10-CM

## 2011-09-22 DIAGNOSIS — C775 Secondary and unspecified malignant neoplasm of intrapelvic lymph nodes: Secondary | ICD-10-CM

## 2011-09-22 DIAGNOSIS — Z5111 Encounter for antineoplastic chemotherapy: Secondary | ICD-10-CM

## 2011-09-22 LAB — CBC WITH DIFFERENTIAL (CANCER CENTER ONLY)
BASO%: 0.6 % (ref 0.0–2.0)
Eosinophils Absolute: 0.1 10*3/uL (ref 0.0–0.5)
LYMPH%: 31.7 % (ref 14.0–48.0)
MCH: 20.6 pg — ABNORMAL LOW (ref 26.0–34.0)
MCV: 63 fL — ABNORMAL LOW (ref 81–101)
MONO%: 11.2 % (ref 0.0–13.0)
NEUT#: 1.9 10*3/uL (ref 1.5–6.5)
Platelets: 119 10*3/uL — ABNORMAL LOW (ref 145–400)
RBC: 4.65 10*6/uL (ref 3.70–5.32)
RDW: 24.4 % — ABNORMAL HIGH (ref 11.1–15.7)
WBC: 3.6 10*3/uL — ABNORMAL LOW (ref 3.9–10.0)

## 2011-09-22 LAB — COMPREHENSIVE METABOLIC PANEL
Albumin: 4 g/dL (ref 3.5–5.2)
Alkaline Phosphatase: 64 U/L (ref 39–117)
CO2: 24 mEq/L (ref 19–32)
Glucose, Bld: 93 mg/dL (ref 70–99)
Potassium: 3.7 mEq/L (ref 3.5–5.3)
Sodium: 140 mEq/L (ref 135–145)
Total Protein: 6.4 g/dL (ref 6.0–8.3)

## 2011-09-22 MED ORDER — SODIUM CHLORIDE 0.9 % IJ SOLN
10.0000 mL | INTRAMUSCULAR | Status: DC | PRN
  Filled 2011-09-22: qty 10

## 2011-09-22 MED ORDER — LEUCOVORIN CALCIUM INJECTION 350 MG
400.0000 mg/m2 | Freq: Once | INTRAVENOUS | Status: AC
  Administered 2011-09-22: 752 mg via INTRAVENOUS
  Filled 2011-09-22: qty 37.6

## 2011-09-22 MED ORDER — DEXTROSE 5 % IV SOLN
Freq: Once | INTRAVENOUS | Status: AC
  Administered 2011-09-22: 10:00:00 via INTRAVENOUS

## 2011-09-22 MED ORDER — ONDANSETRON 8 MG/50ML IVPB (CHCC)
8.0000 mg | Freq: Once | INTRAVENOUS | Status: AC
  Administered 2011-09-22: 8 mg via INTRAVENOUS
  Filled 2011-09-22: qty 8

## 2011-09-22 MED ORDER — SODIUM CHLORIDE 0.9 % IV SOLN
Freq: Once | INTRAVENOUS | Status: AC
  Administered 2011-09-22: 11:00:00 via INTRAVENOUS
  Filled 2011-09-22: qty 10

## 2011-09-22 MED ORDER — FLUOROURACIL CHEMO INJECTION 5 GM/100ML
2400.0000 mg/m2 | INTRAVENOUS | Status: DC
  Administered 2011-09-22: 4500 mg via INTRAVENOUS
  Filled 2011-09-22: qty 90

## 2011-09-22 MED ORDER — DEXAMETHASONE SODIUM PHOSPHATE 10 MG/ML IJ SOLN
10.0000 mg | Freq: Once | INTRAMUSCULAR | Status: AC
  Administered 2011-09-22: 10 mg via INTRAVENOUS

## 2011-09-22 MED ORDER — FLUOROURACIL CHEMO INJECTION 2.5 GM/50ML
400.0000 mg/m2 | Freq: Once | INTRAVENOUS | Status: AC
  Administered 2011-09-22: 750 mg via INTRAVENOUS
  Filled 2011-09-22: qty 15

## 2011-09-22 MED ORDER — HEPARIN SOD (PORK) LOCK FLUSH 100 UNIT/ML IV SOLN
500.0000 [IU] | Freq: Once | INTRAVENOUS | Status: DC | PRN
  Filled 2011-09-22: qty 5

## 2011-09-22 MED ORDER — SODIUM CHLORIDE 0.9 % IV SOLN
Freq: Once | INTRAVENOUS | Status: AC
  Administered 2011-09-22: 14:00:00 via INTRAVENOUS
  Filled 2011-09-22: qty 10

## 2011-09-22 MED ORDER — OXALIPLATIN CHEMO INJECTION 100 MG/20ML
85.0000 mg/m2 | Freq: Once | INTRAVENOUS | Status: AC
  Administered 2011-09-22: 160 mg via INTRAVENOUS
  Filled 2011-09-22: qty 32

## 2011-09-22 NOTE — Progress Notes (Signed)
CSN: [id number] This office note has been dictated. ENNEVER,PETER R 09/22/2011

## 2011-09-22 NOTE — Progress Notes (Signed)
CC:   Teresa Elsie Amis, MD, Teresa Bouillon, MD  DIAGNOSIS:  Stage IIIB (T2 N2a M0) adenocarcinoma of the cecum.  CURRENT THERAPY:  The patient is status post 2 cycles of FOLFOX.  INTERIM HISTORY:  Teresa Morgan comes in for followup.  She is doing well with her chemotherapy.  She has lost a little hair.  She has had no nausea or vomiting.  She has had some abdominal discomfort.  This may be from where she had her past surgery.  There has been no change in bowel or bladder habits.  She has had no rashes.  There has been no bleeding.  There has been no fevers, sweats, or CHILLS.  PHYSICAL EXAMINATION:  General Appearance:  This is a well-developed, well-nourished, African American female in no obvious distress.  Vital Signs:  97, pulse 71, respiratory rate 18, blood pressure 130/85, weight is 163.  Head Exam:  A normocephalic, atraumatic skull.  There are no ocular or oral lesions.  Neck:  There are no palpable cervical or supraclavicular lymph nodes.  Lungs:  Clear bilaterally.  Cardiac Exam: Regular rate and rhythm with a normal S1, S2.  There are no murmurs, rubs, or bruits.  Abdominal Exam:  Soft with good bowel sounds.  She has a well-healed laparoscopy scar.  There is no guarding or rebound tenderness.  There is no palpable hepatosplenomegaly.  Back Exam:  No tenderness over the spine, ribs, or hips.  Extremities:  No clubbing, cyanosis, or edema.  Neurological Exam:  No focal neurological deficits.  LABORATORY STUDIES:  White cell count 3.6, hemoglobin 9.6, hematocrit 29.5, platelet count 119.  IMPRESSION:  Teresa Morgan is a 55 year old Filipino/African American female with stage IIIB adenocarcinoma of the cecum.  She had a laparoscopic resection.  She had IV positive lymph nodes.  She is on adjuvant chemotherapy.  She will continue to be on therapy every 2 weeks.  We will go ahead and plan to get her back in 2 more weeks.  I want to try and stay on schedule as much as  possible.    ______________________________ Josph Macho, M.D. PRE/MEDQ  D:  09/22/2011  T:  09/22/2011  Job:  [id number]

## 2011-09-24 ENCOUNTER — Ambulatory Visit (HOSPITAL_BASED_OUTPATIENT_CLINIC_OR_DEPARTMENT_OTHER): Payer: 59

## 2011-09-24 VITALS — BP 136/76 | HR 78 | Temp 96.9°F

## 2011-09-24 DIAGNOSIS — C775 Secondary and unspecified malignant neoplasm of intrapelvic lymph nodes: Secondary | ICD-10-CM

## 2011-09-24 DIAGNOSIS — C180 Malignant neoplasm of cecum: Secondary | ICD-10-CM

## 2011-09-24 MED ORDER — SODIUM CHLORIDE 0.9 % IJ SOLN
10.0000 mL | INTRAMUSCULAR | Status: DC | PRN
  Administered 2011-09-24: 10 mL
  Filled 2011-09-24: qty 10

## 2011-09-24 MED ORDER — HEPARIN SOD (PORK) LOCK FLUSH 100 UNIT/ML IV SOLN
500.0000 [IU] | Freq: Once | INTRAVENOUS | Status: AC | PRN
  Administered 2011-09-24: 500 [IU]
  Filled 2011-09-24: qty 5

## 2011-10-04 ENCOUNTER — Other Ambulatory Visit: Payer: Self-pay | Admitting: Hematology & Oncology

## 2011-10-05 ENCOUNTER — Ambulatory Visit (HOSPITAL_BASED_OUTPATIENT_CLINIC_OR_DEPARTMENT_OTHER): Payer: 59 | Admitting: Family

## 2011-10-05 ENCOUNTER — Other Ambulatory Visit: Payer: 59 | Admitting: Lab

## 2011-10-05 ENCOUNTER — Ambulatory Visit: Payer: 59

## 2011-10-05 ENCOUNTER — Encounter: Payer: Self-pay | Admitting: Family

## 2011-10-05 ENCOUNTER — Ambulatory Visit (HOSPITAL_BASED_OUTPATIENT_CLINIC_OR_DEPARTMENT_OTHER): Payer: 59 | Admitting: Lab

## 2011-10-05 ENCOUNTER — Other Ambulatory Visit: Payer: Self-pay | Admitting: Hematology & Oncology

## 2011-10-05 ENCOUNTER — Ambulatory Visit (HOSPITAL_BASED_OUTPATIENT_CLINIC_OR_DEPARTMENT_OTHER): Payer: 59

## 2011-10-05 VITALS — BP 125/81 | HR 84 | Temp 98.6°F | Ht 68.0 in | Wt 164.0 lb

## 2011-10-05 DIAGNOSIS — C180 Malignant neoplasm of cecum: Secondary | ICD-10-CM

## 2011-10-05 DIAGNOSIS — Z5111 Encounter for antineoplastic chemotherapy: Secondary | ICD-10-CM

## 2011-10-05 DIAGNOSIS — C189 Malignant neoplasm of colon, unspecified: Secondary | ICD-10-CM

## 2011-10-05 DIAGNOSIS — R1013 Epigastric pain: Secondary | ICD-10-CM

## 2011-10-05 DIAGNOSIS — M79661 Pain in right lower leg: Secondary | ICD-10-CM

## 2011-10-05 DIAGNOSIS — K3189 Other diseases of stomach and duodenum: Secondary | ICD-10-CM

## 2011-10-05 LAB — COMPREHENSIVE METABOLIC PANEL
ALT: 21 U/L (ref 0–35)
AST: 26 U/L (ref 0–37)
Albumin: 4 g/dL (ref 3.5–5.2)
Alkaline Phosphatase: 70 U/L (ref 39–117)
Glucose, Bld: 113 mg/dL — ABNORMAL HIGH (ref 70–99)
Potassium: 4.2 mEq/L (ref 3.5–5.3)
Sodium: 144 mEq/L (ref 135–145)
Total Protein: 6.2 g/dL (ref 6.0–8.3)

## 2011-10-05 LAB — TECHNOLOGIST REVIEW CHCC SATELLITE

## 2011-10-05 LAB — CBC WITH DIFFERENTIAL (CANCER CENTER ONLY)
BASO%: 0.5 % (ref 0.0–2.0)
Eosinophils Absolute: 0.2 10*3/uL (ref 0.0–0.5)
MCH: 21.1 pg — ABNORMAL LOW (ref 26.0–34.0)
MONO%: 9.6 % (ref 0.0–13.0)
NEUT#: 2.7 10*3/uL (ref 1.5–6.5)
Platelets: 86 10*3/uL — ABNORMAL LOW (ref 145–400)
RBC: 4.65 10*6/uL (ref 3.70–5.32)
RDW: 26.7 % — ABNORMAL HIGH (ref 11.1–15.7)
WBC: 4.3 10*3/uL (ref 3.9–10.0)

## 2011-10-05 MED ORDER — SODIUM CHLORIDE 0.9 % IV SOLN
Freq: Once | INTRAVENOUS | Status: AC
  Administered 2011-10-05: 16:00:00 via INTRAVENOUS
  Filled 2011-10-05: qty 10

## 2011-10-05 MED ORDER — SODIUM CHLORIDE 0.9 % IV SOLN
2400.0000 mg/m2 | INTRAVENOUS | Status: DC
  Administered 2011-10-05: 4500 mg via INTRAVENOUS
  Filled 2011-10-05: qty 90

## 2011-10-05 MED ORDER — FLUOROURACIL CHEMO INJECTION 2.5 GM/50ML
400.0000 mg/m2 | Freq: Once | INTRAVENOUS | Status: AC
  Administered 2011-10-05: 750 mg via INTRAVENOUS
  Filled 2011-10-05: qty 15

## 2011-10-05 MED ORDER — DEXTROSE 5 % IV SOLN
Freq: Once | INTRAVENOUS | Status: AC
  Administered 2011-10-05: 11:00:00 via INTRAVENOUS

## 2011-10-05 MED ORDER — LEUCOVORIN CALCIUM INJECTION 350 MG
400.0000 mg/m2 | Freq: Once | INTRAVENOUS | Status: AC
  Administered 2011-10-05: 752 mg via INTRAVENOUS
  Filled 2011-10-05: qty 37.6

## 2011-10-05 MED ORDER — PALONOSETRON HCL INJECTION 0.25 MG/5ML
0.2500 mg | Freq: Once | INTRAVENOUS | Status: AC
  Administered 2011-10-05: 0.25 mg via INTRAVENOUS

## 2011-10-05 MED ORDER — OXALIPLATIN CHEMO INJECTION 100 MG/20ML
85.0000 mg/m2 | Freq: Once | INTRAVENOUS | Status: AC
  Administered 2011-10-05: 160 mg via INTRAVENOUS
  Filled 2011-10-05: qty 32

## 2011-10-05 MED ORDER — BELLADONNA ALK-PHENOBARBITAL 16.2 MG PO TABS: 1.0000 | ORAL_TABLET | Freq: Four times a day (QID) | ORAL | Status: DC | PRN

## 2011-10-05 MED ORDER — SODIUM CHLORIDE 0.9 % IV SOLN
Freq: Once | INTRAVENOUS | Status: AC
  Administered 2011-10-05: 12:00:00 via INTRAVENOUS
  Filled 2011-10-05: qty 10

## 2011-10-05 MED ORDER — FAMOTIDINE IN NACL 20-0.9 MG/50ML-% IV SOLN
20.0000 mg | Freq: Once | INTRAVENOUS | Status: AC
  Administered 2011-10-05: 20 mg via INTRAVENOUS

## 2011-10-05 MED ORDER — DEXAMETHASONE SODIUM PHOSPHATE 10 MG/ML IJ SOLN
10.0000 mg | Freq: Once | INTRAMUSCULAR | Status: AC
  Administered 2011-10-05: 10 mg via INTRAVENOUS

## 2011-10-05 NOTE — Progress Notes (Signed)
DIAGNOSIS: Patient Active Problem List  Diagnoses Date Noted  . Cecal cancer, pT3N2a 06/09/2011  . Bilateral calf pain 06/09/2011     Encounter Diagnosis  Name Primary?  . Colon cancer Yes    CURRENT THERAPY: FOLFOX  INTERIM HISTORY: pt here for scheduled follow-up and chemotherapy. Has had transient nausea, worse day 3 after chemotherapy. Uses antiemetics as needed. Experiencing heartburn, also day 3 after chemo. Takes Zantac.  No SOB, no cough, no abdominal pain. No headache or blurred vision.   PHYSICAL EXAM: BP 125/81  Pulse 84  Temp(Src) 98.6 F (37 C) (Oral)  Ht 5\' 8"  (1.727 m)  Wt 164 lb (74.39 kg)  BMI 24.94 kg/m2 General: Well developed, well nourished, in no acute distress.  EENT: No ocular or oral lesions. No stomatitis.  Respiratory: Lungs are clear to auscultation bilaterally with normal respiratory movement and no accessory muscle use. Cardiac: No murmur, rub or tachycardia. No upper or lower extremity edema.  GI: Abdomen is soft, no palpable hepatosplenomegaly. No fluid wave. No tenderness. Musculoskeletal: No kyphosis, no tenderness over the spine, ribs or hips. Lymph: No cervical, infraclavicular, axillary or inguinal adenopathy. Neuro: No focal neurological deficits. Psych: Alert and oriented X 3, appropriate mood and affect.   LABORATORY STUDIES:   Results for orders placed in visit on 10/05/11  TECHNOLOGIST REVIEW [facility]      Component Value Range   Tech Review Few teardrops, helmets and fragments     CBC    Component Value Date/Time   WBC 4.3 10/05/2011 0936   WBC 4.9 08/04/2011 0915   RBC 5.34* 08/04/2011 0915   HGB 9.8* 10/05/2011 0936   HGB 10.5* 08/04/2011 0915   HCT 30.0* 10/05/2011 0936   HCT 34.3* 08/04/2011 0915   PLT 86* 10/05/2011 0936   PLT 182 08/04/2011 0915   MCV 65* 10/05/2011 0936   MCV 64.2* 08/04/2011 0915   MCH 19.7* 08/04/2011 0915   MCHC 30.6 08/04/2011 0915   RDW 15.7* 08/04/2011 0915   LYMPHSABS TEST NOT PERFORMED  07/27/2011 0000   MONOABS TEST NOT PERFORMED 07/27/2011 0000   EOSABS 0.2 10/05/2011 0936   EOSABS TEST NOT PERFORMED 07/27/2011 0000   BASOSABS 0.0 10/05/2011 0936   BASOSABS TEST NOT PERFORMED 07/27/2011 0000     IMPRESSION:  1. Cecal cancer, receiving chemotherapy. 2. Transient nausea after chemo 3. Dyspepsia following chemo.   PLAN:  Treatment today. Will add Pepcid IV to treatment.

## 2011-10-07 ENCOUNTER — Telehealth: Payer: Self-pay | Admitting: *Deleted

## 2011-10-07 ENCOUNTER — Other Ambulatory Visit: Payer: Self-pay | Admitting: Hematology & Oncology

## 2011-10-07 ENCOUNTER — Ambulatory Visit (HOSPITAL_BASED_OUTPATIENT_CLINIC_OR_DEPARTMENT_OTHER)

## 2011-10-07 DIAGNOSIS — Z452 Encounter for adjustment and management of vascular access device: Secondary | ICD-10-CM

## 2011-10-07 DIAGNOSIS — C180 Malignant neoplasm of cecum: Secondary | ICD-10-CM

## 2011-10-07 MED ORDER — HEPARIN SOD (PORK) LOCK FLUSH 100 UNIT/ML IV SOLN
500.0000 [IU] | Freq: Once | INTRAVENOUS | Status: AC | PRN
  Administered 2011-10-07: 500 [IU]
  Filled 2011-10-07: qty 5

## 2011-10-07 MED ORDER — SODIUM CHLORIDE 0.9 % IJ SOLN
10.0000 mL | INTRAMUSCULAR | Status: DC | PRN
  Administered 2011-10-07: 10 mL
  Filled 2011-10-07: qty 10

## 2011-10-07 NOTE — Telephone Encounter (Signed)
Left pt message with 12-4 appointment time

## 2011-10-17 ENCOUNTER — Telehealth: Payer: Self-pay | Admitting: *Deleted

## 2011-10-17 NOTE — Telephone Encounter (Signed)
Pt moved 12-4 treatment to 12-6 but will be here for MD appointment on 12-4

## 2011-10-18 ENCOUNTER — Other Ambulatory Visit (HOSPITAL_BASED_OUTPATIENT_CLINIC_OR_DEPARTMENT_OTHER): Payer: 59 | Admitting: Lab

## 2011-10-18 ENCOUNTER — Ambulatory Visit: Payer: 59

## 2011-10-18 ENCOUNTER — Other Ambulatory Visit: Payer: Self-pay | Admitting: Hematology & Oncology

## 2011-10-18 ENCOUNTER — Ambulatory Visit (HOSPITAL_BASED_OUTPATIENT_CLINIC_OR_DEPARTMENT_OTHER): Payer: 59 | Admitting: Hematology & Oncology

## 2011-10-18 VITALS — BP 119/78 | HR 76 | Temp 97.2°F | Ht 68.0 in | Wt 165.0 lb

## 2011-10-18 DIAGNOSIS — C180 Malignant neoplasm of cecum: Secondary | ICD-10-CM

## 2011-10-18 DIAGNOSIS — C775 Secondary and unspecified malignant neoplasm of intrapelvic lymph nodes: Secondary | ICD-10-CM

## 2011-10-18 LAB — COMPREHENSIVE METABOLIC PANEL
BUN: 14 mg/dL (ref 6–23)
CO2: 25 mEq/L (ref 19–32)
Calcium: 8.7 mg/dL (ref 8.4–10.5)
Creatinine, Ser: 0.84 mg/dL (ref 0.50–1.10)
Glucose, Bld: 93 mg/dL (ref 70–99)
Total Bilirubin: 0.4 mg/dL (ref 0.3–1.2)

## 2011-10-18 LAB — TECHNOLOGIST REVIEW CHCC SATELLITE

## 2011-10-18 LAB — CBC WITH DIFFERENTIAL (CANCER CENTER ONLY)
BASO#: 0 10*3/uL (ref 0.0–0.2)
Eosinophils Absolute: 0.1 10*3/uL (ref 0.0–0.5)
HGB: 9.5 g/dL — ABNORMAL LOW (ref 11.6–15.9)
LYMPH#: 1.5 10*3/uL (ref 0.9–3.3)
NEUT#: 2.6 10*3/uL (ref 1.5–6.5)
Platelets: 73 10*3/uL — ABNORMAL LOW (ref 145–400)
RBC: 4.45 10*6/uL (ref 3.70–5.32)
WBC: 4.7 10*3/uL (ref 3.9–10.0)

## 2011-10-18 LAB — IRON AND TIBC: TIBC: 398 ug/dL (ref 250–470)

## 2011-10-18 NOTE — Progress Notes (Signed)
This office note has been dictated.

## 2011-10-18 NOTE — Progress Notes (Signed)
CC:   Jyothi Elsie Amis, MD, Harriette Bouillon, MD  DIAGNOSIS:  Stage IIIB (T2 N2a M0) adenocarcinoma of the cecum.  CURRENT THERAPY:  Patient status post 5 cycles of FOLFOX.  INTERVAL HISTORY:  Ms. Southern comes in for followup.  She is actually due for 6 cycle in 2 days.  Apparently she has had more in the way of neuropathy.  As such, I am going to add calcium and magnesium to the chemotherapy program.  She is not having as much abdominal discomfort.  She is having some spasms.  She is not having any problems with diarrhea.  She actually takes a little milk of magnesia.  She has not had any rashes.  There has been no bleeding.  She has had some mouth sores.  This appeared after her last chemotherapy.  PHYSICAL EXAM:  This is a well-developed, well-nourished Filipino female in no obvious distress.  Vital signs:  97.3, pulse 76, respiratory rate 18, blood pressure 119/78.  Weight is 165.  Head and neck:  Exam shows a normocephalic, atraumatic skull.  There are no ocular or oral lesions. There are no palpable cervical or supraclavicular lymph nodes.  Lungs: Clear bilaterally.  Cardiac:  Regular rate and rhythm with normal S1, S2.  There are no murmurs, rubs or bruits.  Abdomen:  Soft with good bowel sounds.  There is no palpable abdominal mass.  There is no fluid wave.  She has a well-healed laparoscopy scar.  There is no palpable hepatosplenomegaly.  Extremities:  Show no clubbing, cyanosis or edema. Skin:  No rashes.  Neurological:  Shows no focal neurological deficits.  LAB STUDIES:  White cell count 4.7, hemoglobin 9.5, hematocrit 30.4, platelet count 73,000.  IMPRESSION:  Ms. Kemmer is a 55 year old African/Filipino female.  She has stage IIIB colon cancer.  She has 4 positive lymph nodes.  Her counts are a little bit on the lower side.  However, she is a couple days early.  Her blood counts should be okay December 6th.  Again, we will add some calcium and magnesium to her  chemotherapy program to try to help minimize neuropathic side effects.  We will plan to get her back to see [place] in another 2 weeks.    ______________________________ Josph Macho, M.D. PRE/MEDQ  D:  10/18/2011  T:  10/18/2011  Job:  [id number]

## 2011-10-20 ENCOUNTER — Ambulatory Visit (HOSPITAL_BASED_OUTPATIENT_CLINIC_OR_DEPARTMENT_OTHER): Payer: 59

## 2011-10-20 VITALS — BP 109/72 | HR 86 | Temp 97.4°F | Ht 68.0 in | Wt 164.4 lb

## 2011-10-20 DIAGNOSIS — R110 Nausea: Secondary | ICD-10-CM

## 2011-10-20 DIAGNOSIS — C180 Malignant neoplasm of cecum: Secondary | ICD-10-CM

## 2011-10-20 DIAGNOSIS — Z5111 Encounter for antineoplastic chemotherapy: Secondary | ICD-10-CM

## 2011-10-20 DIAGNOSIS — C775 Secondary and unspecified malignant neoplasm of intrapelvic lymph nodes: Secondary | ICD-10-CM

## 2011-10-20 MED ORDER — SODIUM CHLORIDE 0.9 % IV SOLN
Freq: Once | INTRAVENOUS | Status: AC
  Administered 2011-10-20: 15:00:00 via INTRAVENOUS
  Filled 2011-10-20: qty 10

## 2011-10-20 MED ORDER — LEUCOVORIN CALCIUM INJECTION 350 MG
400.0000 mg/m2 | Freq: Once | INTRAVENOUS | Status: AC
  Administered 2011-10-20: 752 mg via INTRAVENOUS
  Filled 2011-10-20: qty 37.6

## 2011-10-20 MED ORDER — SODIUM CHLORIDE 0.9 % IJ SOLN
3.0000 mL | INTRAMUSCULAR | Status: DC | PRN
  Filled 2011-10-20: qty 10

## 2011-10-20 MED ORDER — SODIUM CHLORIDE 0.9 % IJ SOLN
10.0000 mL | INTRAMUSCULAR | Status: DC | PRN
  Filled 2011-10-20: qty 10

## 2011-10-20 MED ORDER — OXALIPLATIN CHEMO INJECTION 100 MG/20ML
85.0000 mg/m2 | Freq: Once | INTRAVENOUS | Status: AC
  Administered 2011-10-20: 160 mg via INTRAVENOUS
  Filled 2011-10-20: qty 32

## 2011-10-20 MED ORDER — LORAZEPAM 2 MG/ML IJ SOLN
1.0000 mg | Freq: Once | INTRAMUSCULAR | Status: AC
  Administered 2011-10-20: 1 mg via INTRAVENOUS

## 2011-10-20 MED ORDER — DEXAMETHASONE SODIUM PHOSPHATE 10 MG/ML IJ SOLN
10.0000 mg | Freq: Once | INTRAMUSCULAR | Status: AC
  Administered 2011-10-20: 10 mg via INTRAVENOUS

## 2011-10-20 MED ORDER — FLUOROURACIL CHEMO INJECTION 2.5 GM/50ML
400.0000 mg/m2 | Freq: Once | INTRAVENOUS | Status: AC
  Administered 2011-10-20: 750 mg via INTRAVENOUS
  Filled 2011-10-20: qty 15

## 2011-10-20 MED ORDER — HEPARIN SOD (PORK) LOCK FLUSH 100 UNIT/ML IV SOLN
250.0000 [IU] | Freq: Once | INTRAVENOUS | Status: DC | PRN
  Filled 2011-10-20: qty 5

## 2011-10-20 MED ORDER — SODIUM CHLORIDE 0.9 % IV SOLN
2400.0000 mg/m2 | INTRAVENOUS | Status: DC
  Administered 2011-10-20: 4500 mg via INTRAVENOUS
  Filled 2011-10-20: qty 90

## 2011-10-20 MED ORDER — PALONOSETRON HCL INJECTION 0.25 MG/5ML
0.2500 mg | Freq: Once | INTRAVENOUS | Status: AC
  Administered 2011-10-20: 0.25 mg via INTRAVENOUS

## 2011-10-20 MED ORDER — DEXTROSE 5 % IV SOLN
Freq: Once | INTRAVENOUS | Status: AC
  Administered 2011-10-20: 10:00:00 via INTRAVENOUS

## 2011-10-20 MED ORDER — ALTEPLASE 2 MG IJ SOLR
2.0000 mg | Freq: Once | INTRAMUSCULAR | Status: DC | PRN
  Filled 2011-10-20: qty 2

## 2011-10-20 MED ORDER — HEPARIN SOD (PORK) LOCK FLUSH 100 UNIT/ML IV SOLN
500.0000 [IU] | Freq: Once | INTRAVENOUS | Status: DC | PRN
  Filled 2011-10-20: qty 5

## 2011-10-20 MED ORDER — SODIUM CHLORIDE 0.9 % IV SOLN
Freq: Once | INTRAVENOUS | Status: AC
  Administered 2011-10-20: 11:00:00 via INTRAVENOUS
  Filled 2011-10-20: qty 10

## 2011-10-20 NOTE — Patient Instructions (Signed)
[facility] [place]   CHEMOTHERAPY INSTRUCTIONS   POTENTIAL SIDE EFFECTS OF TREATMENT: Vomiting, Hair Thinning, Pigment Changes (darkening of veins, nail beds, palms of hands, soles of feet, etc.), Bone Marrow Suppression, Abdominal Cramping, Blood in Urine, Nausea, Diarrhea, Sun Sensitivity and Mouth Sores   EDUCATIONAL MATERIALS GIVEN AND REVIEWED: Other Oxaliplatin information regarding cold weather.   SELF CARE ACTIVITIES WHILE ON CHEMOTHERAPY: Increase your fluid intake 48 hours prior to treatment and drink at least 2 quarts per day after treatment., No alcohol intake., No aspirin or other medications unless approved by your oncologist., Eat foods that are light and easy to digest., Eat foods at cold or room temperature., No fried, fatty, or spicy foods immediately before or after treatment., Use soft toothbrush and do not use mouthwashes that contain alcohol. Biotene is a good mouthwash that is available at most pharmacies or may be ordered by calling [phone]., Use warm salt water gargles (1 teaspoon salt per 1 quart warm water) before and after meals and at bedtime. Or you may rinse with 2 tablespoons of three -percent hydrogen peroxide mixed in eight ounces of water., Always use sunscreen with SPF (Sun Protection Factor) of 30 or higher., Use your nausea medication as directed to prevent nausea. and Use your anti-diarrheal medication as directed to stop diarrhea.   MEDICATIONS: You have been given prescriptions for the following medications:  Compazine suppositories 25mg  1 per rectum every 6 hours as needed for nausea or vomiting, Decadron 4 mg 3 tabs by mouth daily on day , Zofran  and EMLA Cream Apply quarter size application to Port site 1 hour prior to chemotherapy. Do NOT rub in. Cover area with plastic wrap.  SYMPTOMS TO REPORT AS SOON AS POSSIBLE AFTER TREATMENT:  FEVER GREATER THAN 100.5 F  CHILLS WITH OR WITHOUT FEVER  NAUSEA AND VOMITING THAT  IS NOT CONTROLLED WITH YOUR NAUSEA MEDICATION  UNUSUAL SHORTNESS OF BREATH  UNUSUAL BRUISING OR BLEEDING  TENDERNESS IN MOUTH AND THROAT WITH OR WITHOUT PRESENCE OF ULCERS  URINARY PROBLEMS  BOWEL PROBLEMS  UNUSUAL RASH    Wear comfortable clothing and clothing appropriate for easy access to any Portacath or PICC line. Let us know if there is anything that we can do to make your therapy better!      I have been informed and understand all of the instructions given to me and have received a copy. I have been instructed to call the clinic ([phone])  or my family physician as soon as possible for continued medical care, if indicated. I do not have any more questions at this time but understand that I may call the Cancer Center at ([phone] during office hours should I have questions or need assistance in obtaining follow-up care.      _________________________________________      _______________     __________ Signature of Patient or Authorized Representative        Date                            Time      _________________________________________ Nurse's Signature

## 2011-10-22 ENCOUNTER — Ambulatory Visit (HOSPITAL_BASED_OUTPATIENT_CLINIC_OR_DEPARTMENT_OTHER): Payer: 59

## 2011-10-22 VITALS — BP 122/79 | HR 63 | Temp 98.2°F

## 2011-10-22 DIAGNOSIS — C180 Malignant neoplasm of cecum: Secondary | ICD-10-CM

## 2011-10-22 DIAGNOSIS — C775 Secondary and unspecified malignant neoplasm of intrapelvic lymph nodes: Secondary | ICD-10-CM

## 2011-10-22 MED ORDER — SODIUM CHLORIDE 0.9 % IJ SOLN
10.0000 mL | INTRAMUSCULAR | Status: DC | PRN
  Administered 2011-10-22: 10 mL via INTRAVENOUS
  Filled 2011-10-22: qty 10

## 2011-10-22 MED ORDER — HEPARIN SOD (PORK) LOCK FLUSH 100 UNIT/ML IV SOLN
500.0000 [IU] | Freq: Once | INTRAVENOUS | Status: AC
  Administered 2011-10-22: 500 [IU] via INTRAVENOUS
  Filled 2011-10-22: qty 5

## 2011-11-01 ENCOUNTER — Encounter (INDEPENDENT_AMBULATORY_CARE_PROVIDER_SITE_OTHER): Payer: Self-pay | Admitting: General Surgery

## 2011-11-01 ENCOUNTER — Ambulatory Visit (INDEPENDENT_AMBULATORY_CARE_PROVIDER_SITE_OTHER): Payer: 59 | Admitting: General Surgery

## 2011-11-01 VITALS — BP 108/66 | HR 62 | Temp 97.8°F | Resp 16 | Ht 69.0 in | Wt 163.6 lb

## 2011-11-01 DIAGNOSIS — C180 Malignant neoplasm of cecum: Secondary | ICD-10-CM

## 2011-11-01 DIAGNOSIS — K2970 Gastritis, unspecified, without bleeding: Secondary | ICD-10-CM

## 2011-11-01 MED ORDER — LANSOPRAZOLE 30 MG PO CPDR: 30.0000 mg | DELAYED_RELEASE_CAPSULE | Freq: Every day | ORAL | Status: DC

## 2011-11-01 NOTE — Progress Notes (Signed)
HISTORY: Pt doing well after lap R hemicolectomy for cecal cancer.  She is undergoing chemotherapy with Dr. Myna Hidalgo. She is having epigastric abdominal pain for several days after chemotherapy starts.  This feels like "being punched in the stomach."  It does go completely away between treatments.  She has bloating of the upper abdomen associated with the pain.  She denies fevers/ chills.  She is back at work.    PERTINENT REVIEW OF SYSTEMS: Otherwise negative.    EXAM: Head: Normocephalic and atraumatic.  Eyes:  Conjunctivae are normal. Pupils are equal, round, and reactive to light. No scleral icterus.  Neck:  Normal range of motion. Neck supple. No tracheal deviation present. No thyromegaly present.  Resp: No respiratory distress, normal effort. Abd:  Abdomen is soft, non distended, and mildly tender in the epigastrium.   Neurological: Alert and oriented to person, place, and time. Coordination normal.  Skin: Skin is warm and dry. No rash noted. No diaphoretic. No erythema. No pallor.  Psychiatric: Normal mood and affect. Normal behavior. Judgment and thought content normal.   ASSESSMENT AND PLAN:   Gastritis Likely secondary to steroid treatment with chemotherapy. Will prescribe PPI  Cecal cancer, pT3N2a Doing well. On chemotherapy.   Follow up in 6 months. At that time, will be near time for colonoscopy.    Maudry Diego, MD Surgical Oncology, General & Endocrine Surgery [facility]  Provider Not In System No ref. provider found

## 2011-11-01 NOTE — Patient Instructions (Signed)
Take new prescription daily for gastritis.  Follow up in 6 months.  Let Dr. Myna Hidalgo or myself know if this is not working.

## 2011-11-01 NOTE — Assessment & Plan Note (Signed)
Doing well. On chemotherapy.   Follow up in 6 months. At that time, will be near time for colonoscopy.

## 2011-11-01 NOTE — Assessment & Plan Note (Signed)
Likely secondary to steroid treatment with chemotherapy. Will prescribe PPI

## 2011-11-03 ENCOUNTER — Ambulatory Visit (HOSPITAL_BASED_OUTPATIENT_CLINIC_OR_DEPARTMENT_OTHER): Payer: 59

## 2011-11-03 ENCOUNTER — Other Ambulatory Visit: Payer: Self-pay | Admitting: Hematology & Oncology

## 2011-11-03 ENCOUNTER — Other Ambulatory Visit: Payer: 59 | Admitting: Lab

## 2011-11-03 ENCOUNTER — Encounter: Payer: Self-pay | Admitting: Hematology & Oncology

## 2011-11-03 ENCOUNTER — Ambulatory Visit (HOSPITAL_BASED_OUTPATIENT_CLINIC_OR_DEPARTMENT_OTHER): Payer: 59 | Admitting: Hematology & Oncology

## 2011-11-03 VITALS — BP 129/76 | HR 80 | Temp 97.0°F | Ht 69.0 in | Wt 163.0 lb

## 2011-11-03 DIAGNOSIS — C189 Malignant neoplasm of colon, unspecified: Secondary | ICD-10-CM

## 2011-11-03 DIAGNOSIS — C180 Malignant neoplasm of cecum: Secondary | ICD-10-CM

## 2011-11-03 DIAGNOSIS — C775 Secondary and unspecified malignant neoplasm of intrapelvic lymph nodes: Secondary | ICD-10-CM

## 2011-11-03 DIAGNOSIS — R109 Unspecified abdominal pain: Secondary | ICD-10-CM

## 2011-11-03 DIAGNOSIS — Z5111 Encounter for antineoplastic chemotherapy: Secondary | ICD-10-CM

## 2011-11-03 LAB — CBC WITH DIFFERENTIAL (CANCER CENTER ONLY)
BASO#: 0 10*3/uL (ref 0.0–0.2)
Eosinophils Absolute: 0.1 10*3/uL (ref 0.0–0.5)
HCT: 30.4 % — ABNORMAL LOW (ref 34.8–46.6)
MCHC: 32.2 g/dL (ref 32.0–36.0)
MCV: 69 fL — ABNORMAL LOW (ref 81–101)
MONO%: 11.3 % (ref 0.0–13.0)
Platelets: 64 10*3/uL — ABNORMAL LOW (ref 145–400)
RDW: 26.9 % — ABNORMAL HIGH (ref 11.1–15.7)
WBC: 3.8 10*3/uL — ABNORMAL LOW (ref 3.9–10.0)

## 2011-11-03 LAB — COMPREHENSIVE METABOLIC PANEL
AST: 28 U/L (ref 0–37)
Albumin: 3.9 g/dL (ref 3.5–5.2)
Alkaline Phosphatase: 79 U/L (ref 39–117)
BUN: 15 mg/dL (ref 6–23)
Potassium: 3.9 mEq/L (ref 3.5–5.3)

## 2011-11-03 MED ORDER — SODIUM CHLORIDE 0.9 % IV SOLN
2400.0000 mg/m2 | INTRAVENOUS | Status: DC
  Administered 2011-11-03: 4500 mg via INTRAVENOUS
  Filled 2011-11-03: qty 90

## 2011-11-03 MED ORDER — DEXAMETHASONE 4 MG PO TABS: 4.0000 mg | ORAL_TABLET | Freq: Two times a day (BID) | ORAL | Status: DC

## 2011-11-03 MED ORDER — LEUCOVORIN CALCIUM INJECTION 350 MG
400.0000 mg/m2 | Freq: Once | INTRAVENOUS | Status: AC
  Administered 2011-11-03: 752 mg via INTRAVENOUS
  Filled 2011-11-03: qty 37.6

## 2011-11-03 MED ORDER — DIPHENHYDRAMINE HCL 50 MG/ML IJ SOLN
25.0000 mg | Freq: Once | INTRAMUSCULAR | Status: AC
  Administered 2011-11-03: 25 mg via INTRAVENOUS

## 2011-11-03 MED ORDER — METHYLPREDNISOLONE SODIUM SUCC 125 MG IJ SOLR
60.0000 mg | Freq: Once | INTRAMUSCULAR | Status: AC
  Administered 2011-11-03: 60 mg via INTRAVENOUS

## 2011-11-03 MED ORDER — SODIUM CHLORIDE 0.9 % IV SOLN
Freq: Once | INTRAVENOUS | Status: AC
  Administered 2011-11-03: 13:00:00 via INTRAVENOUS
  Filled 2011-11-03: qty 10

## 2011-11-03 MED ORDER — PALONOSETRON HCL INJECTION 0.25 MG/5ML
0.2500 mg | Freq: Once | INTRAVENOUS | Status: AC
  Administered 2011-11-03: 0.25 mg via INTRAVENOUS

## 2011-11-03 MED ORDER — DEXAMETHASONE SODIUM PHOSPHATE 10 MG/ML IJ SOLN
10.0000 mg | Freq: Once | INTRAMUSCULAR | Status: AC
  Administered 2011-11-03: 10 mg via INTRAVENOUS

## 2011-11-03 MED ORDER — SODIUM CHLORIDE 0.9 % IJ SOLN
3.0000 mL | INTRAMUSCULAR | Status: DC | PRN
  Administered 2011-11-03: 3 mL via INTRAVENOUS
  Filled 2011-11-03: qty 10

## 2011-11-03 MED ORDER — FLUOROURACIL CHEMO INJECTION 2.5 GM/50ML
400.0000 mg/m2 | Freq: Once | INTRAVENOUS | Status: AC
  Administered 2011-11-03: 750 mg via INTRAVENOUS
  Filled 2011-11-03: qty 15

## 2011-11-03 MED ORDER — SODIUM CHLORIDE 0.9 % IV SOLN
Freq: Once | INTRAVENOUS | Status: AC
  Administered 2011-11-03: 17:00:00 via INTRAVENOUS
  Filled 2011-11-03: qty 10

## 2011-11-03 MED ORDER — OXALIPLATIN CHEMO INJECTION 100 MG/20ML
85.0000 mg/m2 | Freq: Once | INTRAVENOUS | Status: AC
  Administered 2011-11-03: 160 mg via INTRAVENOUS
  Filled 2011-11-03: qty 32

## 2011-11-03 MED ORDER — DEXTROSE 5 % IV SOLN
Freq: Once | INTRAVENOUS | Status: AC
  Administered 2011-11-03: 13:00:00 via INTRAVENOUS

## 2011-11-03 MED ORDER — HEPARIN SOD (PORK) LOCK FLUSH 100 UNIT/ML IV SOLN
500.0000 [IU] | Freq: Once | INTRAVENOUS | Status: AC | PRN
  Administered 2011-11-03: 500 [IU]
  Filled 2011-11-03: qty 5

## 2011-11-03 MED ORDER — FAMOTIDINE IN NACL 20-0.9 MG/50ML-% IV SOLN
20.0000 mg | Freq: Once | INTRAVENOUS | Status: AC
  Administered 2011-11-03: 20 mg via INTRAVENOUS

## 2011-11-03 NOTE — Progress Notes (Signed)
This office note has been dictated.

## 2011-11-03 NOTE — Progress Notes (Signed)
CC:   Almond Lint, MD Anselmo Rod, MD, [facility]  DIAGNOSIS:  Stage IIIB (T2 N2a M0) adenocarcinoma of the cecum.  CURRENT THERAPY:  The patient is status post 6 cycles of FOLFOX.  INTERIM HISTORY:  Ms. Ranker comes in for followup.  She has done quite well.  There is still some abdominal discomfort.  This is in the upper abdomen.  It is hard to say what is triggering this.  There is no pattern to it.  It is not related to eating.  She is eating okay.  There is no associated diarrhea.  She has had no cough or shortness of breath.  There have been some nosebleeds.  This is not a constant problem.  She has not noticed any rashes.  There is some slight bruising.  She has had no headache.  There is no dysphasia or odynophagia.  She has had no mouth sores.  Overall, she is really doing well with treatment.  She has not had any problems with neuropathy from the oxaliplatin.  PHYSICAL EXAM:  General:  This is a well-developed, well-nourished Filipino female in no obvious distress.  Vital Signs:  Temperature 97, pulse 82, respiratory rate 16, blood pressure 129/76.  Weight is 163. Head/Neck:  Exam shows a normocephalic, atraumatic skull.  There are no ocular or oral lesions.  There are no palpable cervical or supraclavicular lymph nodes.  Lungs:  Clear to percussion and auscultation bilaterally.  Cardiac:  Regular rate and rhythm with a normal S1, S2.  There are no murmurs, rubs or bruits.  Abdomen:  Soft with good bowel sounds.  She has well-healed laparoscopy scars.  There may be some slight tenderness to palpation in the epigastric region. She has no fluid wave.  There is no palpable hepatosplenomegaly. Extremities:  No clubbing, cyanosis or edema.  She has good range motion of her joints.  Skin:  Exam shows some scattered ecchymoses.  No petechiae are noted.  Neurologic:  Exam shows no focal neurological deficits.  LABORATORY STUDIES:  White cell count is 3.8, hemoglobin 9.8,  hematocrit 30.4, platelet count 64,000.  MCV is 69.  IMPRESSION:  Ms. Hardie is a 55 year old Filipino female with stage IIIB cecal cancer.  She had FOUR positive lymph nodes.  She has been fairly regular with her schedule for chemo.  I have noted that her platelet count is trending downward.  I think we can go ahead and treat her today.  However, we may have to consider giving her a break next month for a week depending on her platelet recovery.  We will go ahead and plan to get her back to see [place] in another couple weeks.  I talked to her at length about nutrition.  I told her that she could really anything that she wants.  If she wants to eat fruits and vegetables, that would be okay as long as they are washed thoroughly.  If the epigastric pain continues, we may need to consider an upper GI or possible upper endoscopy.    ______________________________ Josph Macho, M.D. PRE/MEDQ  D:  11/03/2011  T:  11/03/2011  Job:  [id number]

## 2011-11-03 NOTE — Progress Notes (Signed)
At 1520 pt c/o hands severty itching.  Rubbing hands over and over.  States her tongue is numb.  Pt's forehead, nose and chin red but no itching. BP is 160/91 and pulse is 86.   Chemo stopped.  Dr. Velvet Bathe notified.  Benito Mccreedy 25mg  given, then Pepcid 20mg  at 1525 and then Solu Medrol 60mg  at 1540.  By 1600 itching in hands has stopped and face reddness is fading.  Chemo restarted at 1500 per Dr. Myna Hidalgo.

## 2011-11-05 ENCOUNTER — Ambulatory Visit (HOSPITAL_BASED_OUTPATIENT_CLINIC_OR_DEPARTMENT_OTHER): Payer: 59

## 2011-11-05 DIAGNOSIS — C180 Malignant neoplasm of cecum: Secondary | ICD-10-CM

## 2011-11-05 DIAGNOSIS — K2970 Gastritis, unspecified, without bleeding: Secondary | ICD-10-CM

## 2011-11-05 MED ORDER — SODIUM CHLORIDE 0.9 % IJ SOLN
10.0000 mL | INTRAMUSCULAR | Status: DC | PRN
  Administered 2011-11-05: 10 mL
  Filled 2011-11-05: qty 10

## 2011-11-05 MED ORDER — HEPARIN SOD (PORK) LOCK FLUSH 100 UNIT/ML IV SOLN
500.0000 [IU] | Freq: Once | INTRAVENOUS | Status: AC | PRN
  Administered 2011-11-05: 500 [IU]
  Filled 2011-11-05: qty 5

## 2011-11-05 NOTE — Progress Notes (Signed)
1245-Presents for pump disconnect . Pump discontinued and port flushed without problems. Pt understands to call for concerns

## 2011-11-14 ENCOUNTER — Other Ambulatory Visit (HOSPITAL_BASED_OUTPATIENT_CLINIC_OR_DEPARTMENT_OTHER): Payer: 59 | Admitting: Lab

## 2011-11-14 ENCOUNTER — Ambulatory Visit (HOSPITAL_BASED_OUTPATIENT_CLINIC_OR_DEPARTMENT_OTHER): Payer: 59 | Admitting: Hematology & Oncology

## 2011-11-14 VITALS — BP 121/78 | HR 82 | Temp 97.6°F | Ht 69.0 in | Wt 164.5 lb

## 2011-11-14 DIAGNOSIS — R109 Unspecified abdominal pain: Secondary | ICD-10-CM

## 2011-11-14 DIAGNOSIS — C180 Malignant neoplasm of cecum: Secondary | ICD-10-CM

## 2011-11-14 DIAGNOSIS — C189 Malignant neoplasm of colon, unspecified: Secondary | ICD-10-CM

## 2011-11-14 LAB — CBC WITH DIFFERENTIAL (CANCER CENTER ONLY)
BASO%: 0.2 % (ref 0.0–2.0)
Eosinophils Absolute: 0.1 10*3/uL (ref 0.0–0.5)
LYMPH#: 1.4 10*3/uL (ref 0.9–3.3)
LYMPH%: 33.3 % (ref 14.0–48.0)
MCV: 71 fL — ABNORMAL LOW (ref 81–101)
MONO#: 0.6 10*3/uL (ref 0.1–0.9)
NEUT#: 2.1 10*3/uL (ref 1.5–6.5)
Platelets: 48 10*3/uL — ABNORMAL LOW (ref 145–400)
RBC: 4.22 10*6/uL (ref 3.70–5.32)
RDW: 25.3 % — ABNORMAL HIGH (ref 11.1–15.7)
WBC: 4.1 10*3/uL (ref 3.9–10.0)

## 2011-11-14 LAB — COMPREHENSIVE METABOLIC PANEL
ALT: 27 U/L (ref 0–35)
Albumin: 3.6 g/dL (ref 3.5–5.2)
CO2: 22 mEq/L (ref 19–32)
Chloride: 107 mEq/L (ref 96–112)
Glucose, Bld: 147 mg/dL — ABNORMAL HIGH (ref 70–99)
Potassium: 3.8 mEq/L (ref 3.5–5.3)
Sodium: 139 mEq/L (ref 135–145)
Total Bilirubin: 0.4 mg/dL (ref 0.3–1.2)
Total Protein: 6 g/dL (ref 6.0–8.3)

## 2011-11-14 MED ORDER — OXYCODONE-ACETAMINOPHEN 7.5-325 MG PO TABS: 1.0000 | ORAL_TABLET | Freq: Four times a day (QID) | ORAL | Status: DC | PRN

## 2011-11-14 NOTE — Progress Notes (Signed)
CC:   Almond Lint, MD Anselmo Rod, MD, [facility]  DIAGNOSIS:  Stage IIIB (T2 N2a M0) adenocarcinoma of the cecum.  CURRENT THERAPY:  Status post 7 cycles of FOLFOX.  INTERIM HISTORY:  Teresa Morgan comes in for followup.  She gets her treatments on Thursday.  Because of our scheduling in the holiday, we had to see her sooner.  She is feeling okay.  She did have a little more lingering side effects from the last chemotherapy.  She still has some upper abdominal discomfort.  There is no vomiting.  She says she thinks it may be more related to food that she eats.   She has not had any problems with bowels or bladder.  There has been no melena or bright red blood per rectum.  There has been no leg swelling.  She is still working.  She actually came from work today.  PHYSICAL EXAM:  General:  This is a well-developed, well-nourished female in no obvious distress.  Vital Signs:  Temperature of 97.6, pulse 82, respiratory rate 18, blood pressure 121/78.  Weight is 168. Head/Neck:  Exam shows a normocephalic, atraumatic skull.  There are no ocular or oral lesions.  There are no palpable cervical or supraclavicular lymph nodes.  Lungs:  Clear bilaterally.  Cardiac:  Exam regular rate and rhythm with a normal S1, S2.  There are no murmurs, rubs or bruits.  Abdomen:  Soft with good bowel sounds.  He has a well- healed laparoscopy scar.  There is no fluid wave.  There is no abdominal mass.  There is no palpable hepatosplenomegaly.  Back:  No tenderness over the spine, ribs, or hips.  Extremities:  No clubbing, cyanosis or edema.  Neurologic: Exam shows no focal neurological deficits.  Skin: No rashes, ecchymosis or petechiae.  LABORATORY STUDIES:  White cell count 4.1, hemoglobin 9.5, hematocrit 29.9, platelet count 48.  MCV is 71.  IMPRESSION:  Ms. Cortinas is a 55 year old Filipino woman with stage IIIB colon cancer.  She had adenocarcinoma of the cecum.  I really think we are going to have  to give her a week break.  I think her platelet count is just too low to be treated right now.  I realize that her treatment is actually in 3 more days.  However, I feel that given the trend with her platelet count that we have to give her a break so that her bone marrow can recover a little bit better.  I told her that she is not going to be at increased risk for recurrence by delaying the treatment by 1 week.  We will plan to get her back next week for her chemo.  I will plan to see her back myself in 3 weeks time.  Overall, Ms. Caputo has done very nicely.    ______________________________ Josph Macho, M.D. PRE/MEDQ  D:  11/14/2011  T:  11/14/2011  Job:  [id number]

## 2011-11-14 NOTE — Progress Notes (Signed)
This office note has been dictated.

## 2011-11-15 DIAGNOSIS — C189 Malignant neoplasm of colon, unspecified: Secondary | ICD-10-CM

## 2011-11-15 HISTORY — DX: Malignant neoplasm of colon, unspecified: C18.9

## 2011-11-16 ENCOUNTER — Telehealth: Payer: Self-pay | Admitting: *Deleted

## 2011-11-16 ENCOUNTER — Other Ambulatory Visit: Payer: 59 | Admitting: Lab

## 2011-11-16 ENCOUNTER — Ambulatory Visit: Payer: 59

## 2011-11-16 ENCOUNTER — Ambulatory Visit: Payer: 59 | Admitting: Hematology & Oncology

## 2011-11-16 NOTE — Telephone Encounter (Signed)
Left pt message with 1-10 appointment time and to pick up schedule

## 2011-11-17 ENCOUNTER — Ambulatory Visit: Payer: 59

## 2011-11-24 ENCOUNTER — Ambulatory Visit: Payer: 59 | Admitting: Lab

## 2011-11-24 ENCOUNTER — Other Ambulatory Visit: Payer: Self-pay | Admitting: Hematology & Oncology

## 2011-11-24 ENCOUNTER — Ambulatory Visit (HOSPITAL_BASED_OUTPATIENT_CLINIC_OR_DEPARTMENT_OTHER): Payer: 59

## 2011-11-24 DIAGNOSIS — C180 Malignant neoplasm of cecum: Secondary | ICD-10-CM

## 2011-11-24 DIAGNOSIS — K2970 Gastritis, unspecified, without bleeding: Secondary | ICD-10-CM

## 2011-11-24 DIAGNOSIS — C775 Secondary and unspecified malignant neoplasm of intrapelvic lymph nodes: Secondary | ICD-10-CM

## 2011-11-24 DIAGNOSIS — Z5111 Encounter for antineoplastic chemotherapy: Secondary | ICD-10-CM

## 2011-11-24 LAB — CBC WITH DIFFERENTIAL (CANCER CENTER ONLY)
BASO%: 0.3 % (ref 0.0–2.0)
LYMPH%: 35.6 % (ref 14.0–48.0)
MCV: 73 fL — ABNORMAL LOW (ref 81–101)
MONO#: 0.5 10*3/uL (ref 0.1–0.9)
MONO%: 15.9 % — ABNORMAL HIGH (ref 0.0–13.0)
NEUT#: 1.2 10*3/uL — ABNORMAL LOW (ref 1.5–6.5)
Platelets: 123 10*3/uL — ABNORMAL LOW (ref 145–400)
RBC: 4.43 10*6/uL (ref 3.70–5.32)
RDW: 23.7 % — ABNORMAL HIGH (ref 11.1–15.7)
WBC: 2.9 10*3/uL — ABNORMAL LOW (ref 3.9–10.0)

## 2011-11-24 LAB — COMPREHENSIVE METABOLIC PANEL
ALT: 21 U/L (ref 0–35)
Alkaline Phosphatase: 99 U/L (ref 39–117)
CO2: 23 mEq/L (ref 19–32)
Potassium: 3.8 mEq/L (ref 3.5–5.3)
Sodium: 142 mEq/L (ref 135–145)
Total Bilirubin: 0.3 mg/dL (ref 0.3–1.2)
Total Protein: 6.5 g/dL (ref 6.0–8.3)

## 2011-11-24 LAB — IRON AND TIBC
%SAT: 12 % — ABNORMAL LOW (ref 20–55)
Iron: 51 ug/dL (ref 42–145)
TIBC: 435 ug/dL (ref 250–470)
UIBC: 384 ug/dL (ref 125–400)

## 2011-11-24 LAB — FERRITIN: Ferritin: 57 ng/mL (ref 10–291)

## 2011-11-24 MED ORDER — LEUCOVORIN CALCIUM INJECTION 350 MG
400.0000 mg/m2 | Freq: Once | INTRAVENOUS | Status: AC
  Administered 2011-11-24: 752 mg via INTRAVENOUS
  Filled 2011-11-24: qty 37.6

## 2011-11-24 MED ORDER — OXALIPLATIN CHEMO INJECTION 100 MG/20ML
85.0000 mg/m2 | Freq: Once | INTRAVENOUS | Status: AC
  Administered 2011-11-24: 160 mg via INTRAVENOUS
  Filled 2011-11-24: qty 32

## 2011-11-24 MED ORDER — PALONOSETRON HCL INJECTION 0.25 MG/5ML
0.2500 mg | Freq: Once | INTRAVENOUS | Status: AC
  Administered 2011-11-24: 0.25 mg via INTRAVENOUS

## 2011-11-24 MED ORDER — HEPARIN SOD (PORK) LOCK FLUSH 100 UNIT/ML IV SOLN
500.0000 [IU] | Freq: Once | INTRAVENOUS | Status: AC | PRN
  Administered 2011-11-24: 500 [IU]
  Filled 2011-11-24: qty 5

## 2011-11-24 MED ORDER — SODIUM CHLORIDE 0.9 % IV SOLN
Freq: Once | INTRAVENOUS | Status: AC
  Administered 2011-11-24: 12:00:00 via INTRAVENOUS
  Filled 2011-11-24: qty 10

## 2011-11-24 MED ORDER — SODIUM CHLORIDE 0.9 % IJ SOLN
10.0000 mL | INTRAMUSCULAR | Status: DC | PRN
  Administered 2011-11-24: 10 mL
  Filled 2011-11-24: qty 10

## 2011-11-24 MED ORDER — SODIUM CHLORIDE 0.9 % IV SOLN
Freq: Once | INTRAVENOUS | Status: AC
  Administered 2011-11-24: 16:00:00 via INTRAVENOUS
  Filled 2011-11-24: qty 10

## 2011-11-24 MED ORDER — FLUOROURACIL CHEMO INJECTION 2.5 GM/50ML
400.0000 mg/m2 | Freq: Once | INTRAVENOUS | Status: AC
  Administered 2011-11-24: 750 mg via INTRAVENOUS
  Filled 2011-11-24: qty 15

## 2011-11-24 MED ORDER — DEXAMETHASONE SODIUM PHOSPHATE 10 MG/ML IJ SOLN
10.0000 mg | Freq: Once | INTRAMUSCULAR | Status: AC
  Administered 2011-11-24: 10 mg via INTRAVENOUS

## 2011-11-24 MED ORDER — SODIUM CHLORIDE 0.9 % IV SOLN
2400.0000 mg/m2 | INTRAVENOUS | Status: DC
  Administered 2011-11-24: 4500 mg via INTRAVENOUS
  Filled 2011-11-24: qty 90

## 2011-11-24 MED ORDER — DEXTROSE 5 % IV SOLN
Freq: Once | INTRAVENOUS | Status: AC
  Administered 2011-11-24: 12:00:00 via INTRAVENOUS

## 2011-11-25 ENCOUNTER — Other Ambulatory Visit: Payer: Self-pay | Admitting: Hematology & Oncology

## 2011-11-25 DIAGNOSIS — D500 Iron deficiency anemia secondary to blood loss (chronic): Secondary | ICD-10-CM

## 2011-11-26 ENCOUNTER — Ambulatory Visit (HOSPITAL_BASED_OUTPATIENT_CLINIC_OR_DEPARTMENT_OTHER): Payer: 59

## 2011-11-26 DIAGNOSIS — K2970 Gastritis, unspecified, without bleeding: Secondary | ICD-10-CM

## 2011-11-26 DIAGNOSIS — C180 Malignant neoplasm of cecum: Secondary | ICD-10-CM

## 2011-11-26 MED ORDER — HEPARIN SOD (PORK) LOCK FLUSH 100 UNIT/ML IV SOLN
500.0000 [IU] | Freq: Once | INTRAVENOUS | Status: AC | PRN
  Administered 2011-11-26: 500 [IU]
  Filled 2011-11-26: qty 5

## 2011-11-26 MED ORDER — SODIUM CHLORIDE 0.9 % IJ SOLN
10.0000 mL | INTRAMUSCULAR | Status: DC | PRN
  Administered 2011-11-26: 10 mL
  Filled 2011-11-26: qty 10

## 2011-12-01 ENCOUNTER — Ambulatory Visit: Payer: 59 | Admitting: Hematology & Oncology

## 2011-12-01 ENCOUNTER — Other Ambulatory Visit: Payer: 59 | Admitting: Lab

## 2011-12-01 ENCOUNTER — Ambulatory Visit: Payer: 59

## 2011-12-02 ENCOUNTER — Ambulatory Visit (INDEPENDENT_AMBULATORY_CARE_PROVIDER_SITE_OTHER): Payer: 59

## 2011-12-02 DIAGNOSIS — J019 Acute sinusitis, unspecified: Secondary | ICD-10-CM

## 2011-12-02 DIAGNOSIS — J069 Acute upper respiratory infection, unspecified: Secondary | ICD-10-CM

## 2011-12-08 ENCOUNTER — Other Ambulatory Visit (HOSPITAL_BASED_OUTPATIENT_CLINIC_OR_DEPARTMENT_OTHER): Payer: 59 | Admitting: Lab

## 2011-12-08 ENCOUNTER — Ambulatory Visit (HOSPITAL_BASED_OUTPATIENT_CLINIC_OR_DEPARTMENT_OTHER): Payer: 59 | Admitting: Hematology & Oncology

## 2011-12-08 ENCOUNTER — Ambulatory Visit (HOSPITAL_BASED_OUTPATIENT_CLINIC_OR_DEPARTMENT_OTHER): Payer: 59

## 2011-12-08 ENCOUNTER — Telehealth: Payer: Self-pay | Admitting: Hematology & Oncology

## 2011-12-08 VITALS — BP 120/77 | HR 76 | Temp 96.5°F | Ht 69.0 in | Wt 165.8 lb

## 2011-12-08 DIAGNOSIS — R509 Fever, unspecified: Secondary | ICD-10-CM

## 2011-12-08 DIAGNOSIS — C180 Malignant neoplasm of cecum: Secondary | ICD-10-CM

## 2011-12-08 DIAGNOSIS — Z5111 Encounter for antineoplastic chemotherapy: Secondary | ICD-10-CM

## 2011-12-08 DIAGNOSIS — R52 Pain, unspecified: Secondary | ICD-10-CM

## 2011-12-08 LAB — CBC WITH DIFFERENTIAL (CANCER CENTER ONLY)
BASO%: 0.5 % (ref 0.0–2.0)
EOS%: 3.2 % (ref 0.0–7.0)
HCT: 31.4 % — ABNORMAL LOW (ref 34.8–46.6)
LYMPH#: 1.3 10*3/uL (ref 0.9–3.3)
MCHC: 31.5 g/dL — ABNORMAL LOW (ref 32.0–36.0)
MONO#: 0.5 10*3/uL (ref 0.1–0.9)
NEUT#: 2.3 10*3/uL (ref 1.5–6.5)
Platelets: 84 10*3/uL — ABNORMAL LOW (ref 145–400)
RDW: 21.1 % — ABNORMAL HIGH (ref 11.1–15.7)
WBC: 4.3 10*3/uL (ref 3.9–10.0)

## 2011-12-08 LAB — COMPREHENSIVE METABOLIC PANEL
ALT: 22 U/L (ref 0–35)
AST: 32 U/L (ref 0–37)
CO2: 24 mEq/L (ref 19–32)
Calcium: 8.8 mg/dL (ref 8.4–10.5)
Chloride: 108 mEq/L (ref 96–112)
Creatinine, Ser: 0.8 mg/dL (ref 0.50–1.10)
Potassium: 4 mEq/L (ref 3.5–5.3)
Sodium: 142 mEq/L (ref 135–145)
Total Protein: 6.2 g/dL (ref 6.0–8.3)

## 2011-12-08 MED ORDER — LEUCOVORIN CALCIUM INJECTION 350 MG
400.0000 mg/m2 | Freq: Once | INTRAVENOUS | Status: AC
  Administered 2011-12-08: 752 mg via INTRAVENOUS
  Filled 2011-12-08: qty 37.6

## 2011-12-08 MED ORDER — MAGNESIUM SULFATE 50 % IJ SOLN
Freq: Once | INTRAMUSCULAR | Status: AC
  Administered 2011-12-08: 13:00:00 via INTRAVENOUS
  Filled 2011-12-08: qty 10

## 2011-12-08 MED ORDER — DEXTROSE 5 % IV SOLN
Freq: Once | INTRAVENOUS | Status: AC
  Administered 2011-12-08: 09:00:00 via INTRAVENOUS

## 2011-12-08 MED ORDER — HEPARIN SOD (PORK) LOCK FLUSH 100 UNIT/ML IV SOLN
500.0000 [IU] | Freq: Once | INTRAVENOUS | Status: AC | PRN
  Filled 2011-12-08: qty 5

## 2011-12-08 MED ORDER — SODIUM CHLORIDE 0.9 % IJ SOLN
10.0000 mL | INTRAMUSCULAR | Status: DC | PRN
  Filled 2011-12-08: qty 10

## 2011-12-08 MED ORDER — PALONOSETRON HCL INJECTION 0.25 MG/5ML
0.2500 mg | Freq: Once | INTRAVENOUS | Status: AC
  Administered 2011-12-08: 0.25 mg via INTRAVENOUS

## 2011-12-08 MED ORDER — ALTEPLASE 2 MG IJ SOLR
2.0000 mg | Freq: Once | INTRAMUSCULAR | Status: AC | PRN
  Filled 2011-12-08: qty 2

## 2011-12-08 MED ORDER — SODIUM CHLORIDE 0.9 % IV SOLN
2400.0000 mg/m2 | INTRAVENOUS | Status: DC
  Administered 2011-12-08: 4500 mg via INTRAVENOUS
  Filled 2011-12-08: qty 90

## 2011-12-08 MED ORDER — SUCRALFATE 1 GM/10ML PO SUSP: 1.0000 g | Freq: Four times a day (QID) | ORAL | Status: AC

## 2011-12-08 MED ORDER — OXALIPLATIN CHEMO INJECTION 100 MG/20ML
85.0000 mg/m2 | Freq: Once | INTRAVENOUS | Status: AC
  Administered 2011-12-08: 160 mg via INTRAVENOUS
  Filled 2011-12-08: qty 32

## 2011-12-08 MED ORDER — DEXAMETHASONE SODIUM PHOSPHATE 10 MG/ML IJ SOLN
10.0000 mg | Freq: Once | INTRAMUSCULAR | Status: AC
  Administered 2011-12-08: 10 mg via INTRAVENOUS

## 2011-12-08 MED ORDER — SODIUM CHLORIDE 0.9 % IV SOLN
Freq: Once | INTRAVENOUS | Status: AC
  Administered 2011-12-08: 10:00:00 via INTRAVENOUS
  Filled 2011-12-08: qty 10

## 2011-12-08 MED ORDER — FLUOROURACIL CHEMO INJECTION 2.5 GM/50ML
400.0000 mg/m2 | Freq: Once | INTRAVENOUS | Status: AC
  Administered 2011-12-08: 750 mg via INTRAVENOUS
  Filled 2011-12-08: qty 15

## 2011-12-08 NOTE — Progress Notes (Signed)
This office note has been dictated.

## 2011-12-08 NOTE — Telephone Encounter (Signed)
In basket orders written were chemo on 2-14 per MD order not valid that date.

## 2011-12-08 NOTE — Patient Instructions (Signed)
[facility]   CHEMOTHERAPY INSTRUCTIONS     SELF CARE ACTIVITIES WHILE ON CHEMOTHERAPY:While on Oxaliplatin or Eloxatin, please avoid exposure to cold.  This will decrease the acute sensory neuropathy associated with this drug.  Avoid ice cream, touching cold surfaces,breathing the cold air(even air conditioning on a hot day).      SYMPTOMS TO REPORT AS SOON AS POSSIBLE AFTER TREATMENT:  FEVER GREATER THAN 100.5 F  CHILLS WITH OR WITHOUT FEVER  NAUSEA AND VOMITING THAT IS NOT CONTROLLED WITH YOUR NAUSEA MEDICATION  UNUSUAL SHORTNESS OF BREATH  UNUSUAL BRUISING OR BLEEDING  TENDERNESS IN MOUTH AND THROAT WITH OR WITHOUT PRESENCE OF ULCERS  URINARY PROBLEMS  BOWEL PROBLEMS  UNUSUAL RASH    Wear comfortable clothing and clothing appropriate for easy access to any Portacath or PICC line. Let us know if there is anything that we can do to make your therapy better!      I have been informed and understand all of the instructions given to me and have received a copy. I have been instructed to call the clinic ([phone])  or my family physician as soon as possible for continued medical care, if indicated. I do not have any more questions at this time but understand that I may call the Cancer Center at ([phone]) during office hours should I have questions or need assistance in obtaining follow-up care.      _________________________________________      _______________     __________ Signature of Patient or Authorized Representative        Date                            Time      _________________________________________ Nurse's Signature

## 2011-12-08 NOTE — Progress Notes (Signed)
CC:   Almond Lint, MD Anselmo Rod, MD, [facility]  DIAGNOSIS:  Stage IIIB (T2 N2a M0) adenocarcinoma of the cecum.  CURRENT THERAPY:  Status post 8 cycles of phosphorus.  INTERIM HISTORY:  Ms. Yazzie comes in for followup.  She is doing okay. She is tolerating chemotherapy fairly well.  She has not had as much abdominal pain.  She says occasionally her abdomen does hurt.  This may be related to the chemotherapy.  She says that it hurts mostly after she eats something ascitic.  I am going to try her on some Carafate to see if this does not help.  She has also been complaining of her toenails turning black and blue. This is her big toe.  Again, this is unusual for chemotherapy.  She has had no fever.  She is still working, but working part time.  PHYSICAL EXAM:  General: This is a well-developed, well-nourished, Asian female in no obvious distress.  Vital signs:  Temperature 96.5, pulse is 76, respiratory rate 20, blood pressure 120/77, and weight is 166.  Head and neck exam shows a normocephalic, atraumatic skull.  There are no ocular or oral lesions.  There are no palpable cervical or supraclavicular lymph nodes.  Lungs are clear to percussion and auscultation bilaterally.  Cardiac exam: Regular rate and rhythm with a normal S1 and S2.  There are no murmurs, rubs, or bruits.  Abdominal exam: Soft with good bowel sounds.  She has well-healed laparoscopy scars.  There is no fluid wave.  There is no guarding or rebound tenderness.  There is no palpable hepatosplenomegaly.  Extremities: Shows no clubbing, cyanosis, or edema.  Neurological exam: Shows no focal neurological deficit.  Skin exam: Shows no rashes, ecchymosis, or petechia.  She has no erythema or active ecchymoses in her extremities.  LABORATORY STUDIES:  White cell count is 4.3, hemoglobin 9.9, hematocrit 31.4, and platelet count is 84.  IMPRESSION:  Ms. Ewing is a 56 year old Filipino woman with stage  IIIB adenocarcinoma of the cecum.  She is on adjuvant chemotherapy.  She is doing well with the treatment.  We have only had 1 treatment delay so far.  We will hopefully see that Carafate will help her out with this abdominal discomfort.  Her platelet count is a little on the low side.  However, I think we can go ahead and move ahead with the treatment.  We will plan to get her back in 2 weeks for her 10th cycle.    ______________________________ Josph Macho, M.D. PRE/MEDQ  D:  12/08/2011  T:  12/08/2011  Job:  [id number]

## 2011-12-10 ENCOUNTER — Ambulatory Visit (HOSPITAL_BASED_OUTPATIENT_CLINIC_OR_DEPARTMENT_OTHER): Payer: 59

## 2011-12-10 DIAGNOSIS — K2970 Gastritis, unspecified, without bleeding: Secondary | ICD-10-CM

## 2011-12-10 DIAGNOSIS — C180 Malignant neoplasm of cecum: Secondary | ICD-10-CM

## 2011-12-10 MED ORDER — HEPARIN SOD (PORK) LOCK FLUSH 100 UNIT/ML IV SOLN
500.0000 [IU] | Freq: Once | INTRAVENOUS | Status: AC | PRN
  Administered 2011-12-10: 500 [IU]
  Filled 2011-12-10: qty 5

## 2011-12-10 MED ORDER — SODIUM CHLORIDE 0.9 % IJ SOLN
10.0000 mL | INTRAMUSCULAR | Status: DC | PRN
  Administered 2011-12-10: 10 mL
  Filled 2011-12-10: qty 10

## 2011-12-10 NOTE — Patient Instructions (Signed)
Pt discharged home.

## 2011-12-22 ENCOUNTER — Ambulatory Visit (HOSPITAL_BASED_OUTPATIENT_CLINIC_OR_DEPARTMENT_OTHER)
Admission: RE | Admit: 2011-12-22 | Discharge: 2011-12-22 | Disposition: A | Discharge status: Home / Self Care | Payer: 59 | Source: Ambulatory Visit | Attending: Hematology & Oncology | Admitting: Hematology & Oncology

## 2011-12-22 ENCOUNTER — Ambulatory Visit (HOSPITAL_BASED_OUTPATIENT_CLINIC_OR_DEPARTMENT_OTHER): Payer: 59

## 2011-12-22 ENCOUNTER — Other Ambulatory Visit (HOSPITAL_BASED_OUTPATIENT_CLINIC_OR_DEPARTMENT_OTHER): Payer: 59 | Admitting: Lab

## 2011-12-22 ENCOUNTER — Ambulatory Visit (HOSPITAL_BASED_OUTPATIENT_CLINIC_OR_DEPARTMENT_OTHER): Payer: 59 | Admitting: Hematology & Oncology

## 2011-12-22 DIAGNOSIS — Z5111 Encounter for antineoplastic chemotherapy: Secondary | ICD-10-CM

## 2011-12-22 DIAGNOSIS — C180 Malignant neoplasm of cecum: Secondary | ICD-10-CM

## 2011-12-22 DIAGNOSIS — R109 Unspecified abdominal pain: Secondary | ICD-10-CM

## 2011-12-22 DIAGNOSIS — K2970 Gastritis, unspecified, without bleeding: Secondary | ICD-10-CM

## 2011-12-22 DIAGNOSIS — Z79899 Other long term (current) drug therapy: Secondary | ICD-10-CM

## 2011-12-22 DIAGNOSIS — C260 Malignant neoplasm of intestinal tract, part unspecified: Secondary | ICD-10-CM | POA: Insufficient documentation

## 2011-12-22 LAB — COMPREHENSIVE METABOLIC PANEL
Albumin: 3.9 g/dL (ref 3.5–5.2)
Alkaline Phosphatase: 93 U/L (ref 39–117)
BUN: 13 mg/dL (ref 6–23)
Glucose, Bld: 113 mg/dL — ABNORMAL HIGH (ref 70–99)
Potassium: 3.8 mEq/L (ref 3.5–5.3)

## 2011-12-22 LAB — CBC WITH DIFFERENTIAL (CANCER CENTER ONLY)
BASO#: 0 10*3/uL (ref 0.0–0.2)
Eosinophils Absolute: 0.1 10*3/uL (ref 0.0–0.5)
HCT: 31.6 % — ABNORMAL LOW (ref 34.8–46.6)
HGB: 9.9 g/dL — ABNORMAL LOW (ref 11.6–15.9)
LYMPH%: 28.7 % (ref 14.0–48.0)
MCH: 22.9 pg — ABNORMAL LOW (ref 26.0–34.0)
MCV: 73 fL — ABNORMAL LOW (ref 81–101)
MONO%: 12.9 % (ref 0.0–13.0)
NEUT#: 2.1 10*3/uL (ref 1.5–6.5)
NEUT%: 56.2 % (ref 39.6–80.0)
RBC: 4.33 10*6/uL (ref 3.70–5.32)

## 2011-12-22 MED ORDER — SODIUM CHLORIDE 0.9 % IJ SOLN
10.0000 mL | INTRAMUSCULAR | Status: DC | PRN
  Filled 2011-12-22: qty 10

## 2011-12-22 MED ORDER — PALONOSETRON HCL INJECTION 0.25 MG/5ML
0.2500 mg | Freq: Once | INTRAVENOUS | Status: AC
  Administered 2011-12-22: 0.25 mg via INTRAVENOUS

## 2011-12-22 MED ORDER — LEUCOVORIN CALCIUM INJECTION 350 MG
400.0000 mg/m2 | Freq: Once | INTRAVENOUS | Status: AC
  Administered 2011-12-22: 752 mg via INTRAVENOUS
  Filled 2011-12-22: qty 37.6

## 2011-12-22 MED ORDER — SODIUM CHLORIDE 0.9 % IV SOLN
Freq: Once | INTRAVENOUS | Status: AC
  Administered 2011-12-22: 14:00:00 via INTRAVENOUS
  Filled 2011-12-22: qty 10

## 2011-12-22 MED ORDER — SODIUM CHLORIDE 0.9 % IV SOLN
Freq: Once | INTRAVENOUS | Status: AC
  Administered 2011-12-22: 11:00:00 via INTRAVENOUS
  Filled 2011-12-22: qty 10

## 2011-12-22 MED ORDER — OXALIPLATIN CHEMO INJECTION 100 MG/20ML
85.0000 mg/m2 | Freq: Once | INTRAVENOUS | Status: AC
  Administered 2011-12-22: 160 mg via INTRAVENOUS
  Filled 2011-12-22: qty 32

## 2011-12-22 MED ORDER — FLUOROURACIL CHEMO INJECTION 2.5 GM/50ML
400.0000 mg/m2 | Freq: Once | INTRAVENOUS | Status: AC
  Administered 2011-12-22: 750 mg via INTRAVENOUS
  Filled 2011-12-22: qty 15

## 2011-12-22 MED ORDER — HEPARIN SOD (PORK) LOCK FLUSH 100 UNIT/ML IV SOLN
500.0000 [IU] | Freq: Once | INTRAVENOUS | Status: DC | PRN
  Filled 2011-12-22: qty 5

## 2011-12-22 MED ORDER — DEXAMETHASONE SODIUM PHOSPHATE 10 MG/ML IJ SOLN
10.0000 mg | Freq: Once | INTRAMUSCULAR | Status: AC
  Administered 2011-12-22: 10 mg via INTRAVENOUS

## 2011-12-22 MED ORDER — DEXTROSE 5 % IV SOLN
Freq: Once | INTRAVENOUS | Status: AC
  Administered 2011-12-22: 10:00:00 via INTRAVENOUS

## 2011-12-22 MED ORDER — SODIUM CHLORIDE 0.9 % IV SOLN
2400.0000 mg/m2 | INTRAVENOUS | Status: DC
  Administered 2011-12-22: 4500 mg via INTRAVENOUS
  Filled 2011-12-22: qty 90

## 2011-12-23 ENCOUNTER — Telehealth: Payer: Self-pay | Admitting: Hematology & Oncology

## 2011-12-23 NOTE — Telephone Encounter (Signed)
Pt aware 2-21 moved to 2-28

## 2011-12-23 NOTE — Progress Notes (Signed)
CC:   Jyothi Elsie Amis, MD, Harriette Bouillon, MD  DIAGNOSIS:  Stage IIIB (T2 N2a M0) adenocarcinoma of the cecum.  CURRENT THERAPY:  The patient is status post 9 cycles of FOLFOX.  INTERIM HISTORY:  Ms. Belmonte comes in for follow-up.  She is doing okay. She is starting to feel more the effects of the chemotherapy.  She says that it is taking her longer to get over the chemotherapy.  She has had no cough.  Her appetite is okay.  She has had a little nausea but no vomiting.  Her abdominal pain has not been as bad.  She is on Carafate which seems to be helping her a little bit.  PHYSICAL EXAMINATION:  General Appearance:  This is a well-developed, well-nourished Asian female in no obvious distress.  Vital Signs:  97.4, pulse 96, respiratory rate 20, blood pressure 124/80.  Weight is 163. Head and Neck Exam:  Shows a normocephalic, atraumatic skull.  There are no ocular or oral lesions.  There are no palpable cervical or supraclavicular lymph nodes.  Lungs:  Clear bilaterally.  Cardiac Exam: Regular rate and rhythm with a normal S1 and S2.  There are no murmurs, rubs or bruits.  Abdominal Exam:  Soft with good bowel sounds.  There is no palpable abdominal mass.  There is no fluid wave.  There is no palpable hepatosplenomegaly.  Her laparoscopy scar is well healed.  Back Exam:  No tenderness over the spine, ribs or hips.  Extremities:  Show no clubbing, cyanosis or edema.  Neurological Exam:  Shows no focal neurological deficits.  LABORATORY STUDIES:  White cell count is 3.7, hemoglobin 9.9, hematocrit 31.6, platelet count is 60,000.  IMPRESSION:  Ms. Labarre is a 56 year old white female with stage IIIB adenocarcinoma of the colon.  She had a cecal carcinoma.  We will go ahead and treat her today.  This will be her tenth treatment. We are "getting close to the end."  I do think, however, that we are going to have to delay her next cycle to the 28th.  I really think that she is  going to need 3 weeks.  We will plan to get her back in 3 weeks for follow-up.    ______________________________ Josph Macho, M.D. PRE/MEDQ  D:  12/22/2011  T:  12/23/2011  Job:  [id number]

## 2011-12-24 ENCOUNTER — Ambulatory Visit (HOSPITAL_BASED_OUTPATIENT_CLINIC_OR_DEPARTMENT_OTHER): Payer: 59

## 2011-12-24 DIAGNOSIS — K2970 Gastritis, unspecified, without bleeding: Secondary | ICD-10-CM

## 2011-12-24 DIAGNOSIS — C180 Malignant neoplasm of cecum: Secondary | ICD-10-CM

## 2011-12-24 MED ORDER — SODIUM CHLORIDE 0.9 % IJ SOLN
10.0000 mL | INTRAMUSCULAR | Status: DC | PRN
  Administered 2011-12-24: 10 mL
  Filled 2011-12-24: qty 10

## 2011-12-24 MED ORDER — HEPARIN SOD (PORK) LOCK FLUSH 100 UNIT/ML IV SOLN
500.0000 [IU] | Freq: Once | INTRAVENOUS | Status: AC | PRN
  Administered 2011-12-24: 500 [IU]
  Filled 2011-12-24: qty 5

## 2011-12-26 ENCOUNTER — Telehealth: Payer: Self-pay | Admitting: *Deleted

## 2011-12-26 NOTE — Telephone Encounter (Signed)
Called patient to let her know that her Xray came back normal per dr. Myna Hidalgo. No excess gas or blockage.

## 2012-01-05 ENCOUNTER — Ambulatory Visit: Payer: 59 | Admitting: Hematology & Oncology

## 2012-01-05 ENCOUNTER — Ambulatory Visit: Payer: 59

## 2012-01-05 ENCOUNTER — Other Ambulatory Visit: Payer: 59 | Admitting: Lab

## 2012-01-12 ENCOUNTER — Ambulatory Visit (HOSPITAL_BASED_OUTPATIENT_CLINIC_OR_DEPARTMENT_OTHER): Payer: 59

## 2012-01-12 ENCOUNTER — Other Ambulatory Visit (HOSPITAL_BASED_OUTPATIENT_CLINIC_OR_DEPARTMENT_OTHER): Payer: 59 | Admitting: Lab

## 2012-01-12 ENCOUNTER — Ambulatory Visit (HOSPITAL_BASED_OUTPATIENT_CLINIC_OR_DEPARTMENT_OTHER): Payer: 59 | Admitting: Hematology & Oncology

## 2012-01-12 ENCOUNTER — Other Ambulatory Visit: Payer: Self-pay | Admitting: Oncology

## 2012-01-12 VITALS — BP 151/89 | HR 89 | Temp 97.0°F | Ht 69.0 in | Wt 168.0 lb

## 2012-01-12 DIAGNOSIS — K2970 Gastritis, unspecified, without bleeding: Secondary | ICD-10-CM

## 2012-01-12 DIAGNOSIS — C180 Malignant neoplasm of cecum: Secondary | ICD-10-CM

## 2012-01-12 DIAGNOSIS — Z5111 Encounter for antineoplastic chemotherapy: Secondary | ICD-10-CM

## 2012-01-12 DIAGNOSIS — R109 Unspecified abdominal pain: Secondary | ICD-10-CM

## 2012-01-12 DIAGNOSIS — C775 Secondary and unspecified malignant neoplasm of intrapelvic lymph nodes: Secondary | ICD-10-CM

## 2012-01-12 DIAGNOSIS — C189 Malignant neoplasm of colon, unspecified: Secondary | ICD-10-CM

## 2012-01-12 DIAGNOSIS — G620 Drug-induced polyneuropathy: Secondary | ICD-10-CM

## 2012-01-12 LAB — CBC WITH DIFFERENTIAL (CANCER CENTER ONLY)
BASO%: 0 % (ref 0.0–2.0)
EOS%: 3.5 % (ref 0.0–7.0)
HCT: 31.9 % — ABNORMAL LOW (ref 34.8–46.6)
LYMPH#: 1.3 10*3/uL (ref 0.9–3.3)
MCHC: 31.3 g/dL — ABNORMAL LOW (ref 32.0–36.0)
NEUT%: 34.6 % — ABNORMAL LOW (ref 39.6–80.0)
RDW: 21.4 % — ABNORMAL HIGH (ref 11.1–15.7)

## 2012-01-12 MED ORDER — PALONOSETRON HCL INJECTION 0.25 MG/5ML
0.2500 mg | Freq: Once | INTRAVENOUS | Status: AC
  Administered 2012-01-12: 0.25 mg via INTRAVENOUS

## 2012-01-12 MED ORDER — FLUOROURACIL CHEMO INJECTION 2.5 GM/50ML
400.0000 mg/m2 | Freq: Once | INTRAVENOUS | Status: AC
  Administered 2012-01-12: 750 mg via INTRAVENOUS
  Filled 2012-01-12: qty 15

## 2012-01-12 MED ORDER — SODIUM CHLORIDE 0.9 % IV SOLN: Freq: Once | INTRAVENOUS | Status: DC

## 2012-01-12 MED ORDER — DEXAMETHASONE SODIUM PHOSPHATE 10 MG/ML IJ SOLN
10.0000 mg | Freq: Once | INTRAMUSCULAR | Status: AC
  Administered 2012-01-12: 10 mg via INTRAVENOUS

## 2012-01-12 MED ORDER — LEUCOVORIN CALCIUM INJECTION 350 MG
400.0000 mg/m2 | Freq: Once | INTRAVENOUS | Status: AC
  Administered 2012-01-12: 752 mg via INTRAVENOUS
  Filled 2012-01-12: qty 37.6

## 2012-01-12 MED ORDER — HEPARIN SOD (PORK) LOCK FLUSH 100 UNIT/ML IV SOLN
500.0000 [IU] | Freq: Once | INTRAVENOUS | Status: DC | PRN
  Filled 2012-01-12: qty 5

## 2012-01-12 MED ORDER — DEXTROSE 5 % IV SOLN: Freq: Once | INTRAVENOUS | Status: DC

## 2012-01-12 MED ORDER — SODIUM CHLORIDE 0.9 % IV SOLN
2400.0000 mg/m2 | INTRAVENOUS | Status: DC
  Administered 2012-01-12: 4500 mg via INTRAVENOUS
  Filled 2012-01-12: qty 90

## 2012-01-12 MED ORDER — OXYCODONE-ACETAMINOPHEN 7.5-325 MG PO TABS: 1.0000 | ORAL_TABLET | Freq: Four times a day (QID) | ORAL | Status: DC | PRN

## 2012-01-12 MED ORDER — SODIUM CHLORIDE 0.9 % IJ SOLN
10.0000 mL | INTRAMUSCULAR | Status: DC | PRN
  Filled 2012-01-12: qty 10

## 2012-01-12 NOTE — Progress Notes (Signed)
CC:   Jyothi Elsie Amis, MD, Harriette Bouillon, MD  DIAGNOSIS:  Stage IIIB (T2 N2a M0) adenocarcinoma of the cecum.  CURRENT THERAPY:  Patient is status post 10 cycles of FOLFOX.  INTERIM HISTORY:  Ms. Imbert comes in for her followup.  She is having a little more in the way of neuropathy.  I will go ahead and cut out her oxaliplatin dosage today.  Otherwise, she is doing okay.  She is working part-time.  She is a little tired after working.  She has some abdominal discomfort, but this is unchanged.  She is having no leg swelling.  There have been no rashes.  She has had no mouth sores.  Her hair is coming out a little bit, but she is not too surprised with this.  Her appetite is doing quite good.  PHYSICAL EXAMINATION:  General Appearance:  This is a well-developed, well-nourished, Asian female in no obvious distress.  Vital Signs:  97, pulse 89, respiratory rate 20, blood pressure 151/89.  Weight is 168. Head and Neck Exam:  A normocephalic, atraumatic skull.  There are no ocular or oral lesions.  There are no palpable cervical or supraclavicular lymph nodes.  Lungs:  Clear bilaterally.  Cardiac Exam: Regular rate and rhythm with a normal S1 and S2.  There are no murmurs, rubs, or bruits.  Abdominal Exam:  Soft with good bowel sounds.  There is no palpable abdominal mass.  She has a well-healed laparoscopy scar. There is no palpable hepatosplenomegaly.  Extremities:  No clubbing, cyanosis, or edema.  Neurological Exam:  No focal neurological deficits. Skin Exam:  No rashes, ecchymoses, or petechia.  LABORATORY STUDIES:  White cell count is 2.9, hemoglobin 10, hematocrit 32, platelet count 117.  IMPRESSION:  Ms. Raupp is a 56 year old Asian female with stage IIIB colon cancer.  She underwent resection.  She had 4 positive lymph nodes.  Again, we will go ahead and delete her oxaliplatin today.  I will plan to get her back in 2 more weeks for followup.  That will be her 12th  and final cycle.  We will then plan for a followup CT scan probably about 6 weeks afterwards.  I am just grateful and proud that Ms. Dewalt has done so well.  We really only had, I think, one delay because of blood counts.    ______________________________ Josph Macho, M.D. PRE/MEDQ  D:  01/12/2012  T:  01/12/2012  Job:  [id number]

## 2012-01-12 NOTE — Progress Notes (Signed)
This office note has been dictated.

## 2012-01-14 ENCOUNTER — Ambulatory Visit (HOSPITAL_BASED_OUTPATIENT_CLINIC_OR_DEPARTMENT_OTHER): Payer: 59

## 2012-01-14 DIAGNOSIS — C180 Malignant neoplasm of cecum: Secondary | ICD-10-CM

## 2012-01-14 DIAGNOSIS — K2970 Gastritis, unspecified, without bleeding: Secondary | ICD-10-CM

## 2012-01-14 MED ORDER — HEPARIN SOD (PORK) LOCK FLUSH 100 UNIT/ML IV SOLN
500.0000 [IU] | Freq: Once | INTRAVENOUS | Status: AC | PRN
  Administered 2012-01-14: 500 [IU]
  Filled 2012-01-14: qty 5

## 2012-01-14 MED ORDER — SODIUM CHLORIDE 0.9 % IJ SOLN
10.0000 mL | INTRAMUSCULAR | Status: DC | PRN
  Administered 2012-01-14: 10 mL
  Filled 2012-01-14: qty 10

## 2012-01-19 ENCOUNTER — Ambulatory Visit: Payer: 59 | Admitting: Hematology & Oncology

## 2012-01-19 ENCOUNTER — Ambulatory Visit: Payer: 59

## 2012-01-19 ENCOUNTER — Other Ambulatory Visit: Payer: 59 | Admitting: Lab

## 2012-01-26 ENCOUNTER — Other Ambulatory Visit (HOSPITAL_BASED_OUTPATIENT_CLINIC_OR_DEPARTMENT_OTHER): Payer: 59 | Admitting: Lab

## 2012-01-26 ENCOUNTER — Ambulatory Visit (HOSPITAL_BASED_OUTPATIENT_CLINIC_OR_DEPARTMENT_OTHER): Payer: 59

## 2012-01-26 ENCOUNTER — Ambulatory Visit (HOSPITAL_BASED_OUTPATIENT_CLINIC_OR_DEPARTMENT_OTHER): Payer: 59 | Admitting: Hematology & Oncology

## 2012-01-26 VITALS — BP 131/80 | HR 84 | Temp 96.9°F | Ht 69.0 in | Wt 166.0 lb

## 2012-01-26 DIAGNOSIS — D500 Iron deficiency anemia secondary to blood loss (chronic): Secondary | ICD-10-CM

## 2012-01-26 DIAGNOSIS — D509 Iron deficiency anemia, unspecified: Secondary | ICD-10-CM

## 2012-01-26 DIAGNOSIS — G589 Mononeuropathy, unspecified: Secondary | ICD-10-CM

## 2012-01-26 DIAGNOSIS — C180 Malignant neoplasm of cecum: Secondary | ICD-10-CM

## 2012-01-26 DIAGNOSIS — C775 Secondary and unspecified malignant neoplasm of intrapelvic lymph nodes: Secondary | ICD-10-CM

## 2012-01-26 DIAGNOSIS — Z5111 Encounter for antineoplastic chemotherapy: Secondary | ICD-10-CM

## 2012-01-26 DIAGNOSIS — K2970 Gastritis, unspecified, without bleeding: Secondary | ICD-10-CM

## 2012-01-26 DIAGNOSIS — R109 Unspecified abdominal pain: Secondary | ICD-10-CM

## 2012-01-26 DIAGNOSIS — C189 Malignant neoplasm of colon, unspecified: Secondary | ICD-10-CM

## 2012-01-26 LAB — CBC WITH DIFFERENTIAL (CANCER CENTER ONLY)
BASO#: 0 10*3/uL (ref 0.0–0.2)
Eosinophils Absolute: 0.1 10*3/uL (ref 0.0–0.5)
HCT: 32.3 % — ABNORMAL LOW (ref 34.8–46.6)
HGB: 10.3 g/dL — ABNORMAL LOW (ref 11.6–15.9)
LYMPH%: 24.5 % (ref 14.0–48.0)
MCH: 23.5 pg — ABNORMAL LOW (ref 26.0–34.0)
MCV: 74 fL — ABNORMAL LOW (ref 81–101)
MONO#: 0.4 10*3/uL (ref 0.1–0.9)
MONO%: 10 % (ref 0.0–13.0)
RBC: 4.39 10*6/uL (ref 3.70–5.32)
WBC: 4.4 10*3/uL (ref 3.9–10.0)

## 2012-01-26 LAB — IRON AND TIBC
%SAT: 17 % — ABNORMAL LOW (ref 20–55)
Iron: 71 ug/dL (ref 42–145)
UIBC: 337 ug/dL (ref 125–400)

## 2012-01-26 LAB — LACTATE DEHYDROGENASE: LDH: 281 U/L — ABNORMAL HIGH (ref 94–250)

## 2012-01-26 LAB — COMPREHENSIVE METABOLIC PANEL
Albumin: 4 g/dL (ref 3.5–5.2)
Alkaline Phosphatase: 87 U/L (ref 39–117)
BUN: 16 mg/dL (ref 6–23)
CO2: 24 mEq/L (ref 19–32)
Glucose, Bld: 133 mg/dL — ABNORMAL HIGH (ref 70–99)
Total Bilirubin: 0.4 mg/dL (ref 0.3–1.2)

## 2012-01-26 LAB — FERRITIN: Ferritin: 52 ng/mL (ref 10–291)

## 2012-01-26 MED ORDER — SODIUM CHLORIDE 0.9 % IV SOLN
Freq: Once | INTRAVENOUS | Status: AC
  Administered 2012-01-26: 12:00:00 via INTRAVENOUS
  Filled 2012-01-26: qty 10

## 2012-01-26 MED ORDER — HEPARIN SOD (PORK) LOCK FLUSH 100 UNIT/ML IV SOLN
500.0000 [IU] | Freq: Once | INTRAVENOUS | Status: DC | PRN
  Filled 2012-01-26: qty 5

## 2012-01-26 MED ORDER — DEXAMETHASONE SODIUM PHOSPHATE 10 MG/ML IJ SOLN
10.0000 mg | Freq: Once | INTRAMUSCULAR | Status: AC
  Administered 2012-01-26: 10 mg via INTRAVENOUS

## 2012-01-26 MED ORDER — DEXTROSE 5 % IV SOLN
Freq: Once | INTRAVENOUS | Status: AC
  Administered 2012-01-26: 11:00:00 via INTRAVENOUS

## 2012-01-26 MED ORDER — SODIUM CHLORIDE 0.9 % IJ SOLN
3.0000 mL | INTRAMUSCULAR | Status: DC | PRN
  Filled 2012-01-26: qty 10

## 2012-01-26 MED ORDER — SODIUM CHLORIDE 0.9 % IV SOLN
Freq: Once | INTRAVENOUS | Status: AC
  Administered 2012-01-26: 14:00:00 via INTRAVENOUS
  Filled 2012-01-26: qty 10

## 2012-01-26 MED ORDER — PALONOSETRON HCL INJECTION 0.25 MG/5ML
0.2500 mg | Freq: Once | INTRAVENOUS | Status: AC
  Administered 2012-01-26: 0.25 mg via INTRAVENOUS

## 2012-01-26 MED ORDER — LEUCOVORIN CALCIUM INJECTION 350 MG
400.0000 mg/m2 | Freq: Once | INTRAVENOUS | Status: AC
  Administered 2012-01-26: 752 mg via INTRAVENOUS
  Filled 2012-01-26: qty 37.6

## 2012-01-26 MED ORDER — FLUOROURACIL CHEMO INJECTION 2.5 GM/50ML
400.0000 mg/m2 | Freq: Once | INTRAVENOUS | Status: AC
  Administered 2012-01-26: 750 mg via INTRAVENOUS
  Filled 2012-01-26: qty 15

## 2012-01-26 MED ORDER — OXALIPLATIN CHEMO INJECTION 100 MG/20ML
85.0000 mg/m2 | Freq: Once | INTRAVENOUS | Status: AC
  Administered 2012-01-26: 160 mg via INTRAVENOUS
  Filled 2012-01-26: qty 32

## 2012-01-26 MED ORDER — SODIUM CHLORIDE 0.9 % IV SOLN
1020.0000 mg | Freq: Once | INTRAVENOUS | Status: AC
  Administered 2012-01-26: 1020 mg via INTRAVENOUS
  Filled 2012-01-26: qty 34

## 2012-01-26 MED ORDER — FLUOROURACIL CHEMO INJECTION 5 GM/100ML
2400.0000 mg/m2 | INTRAVENOUS | Status: DC
  Administered 2012-01-26: 4500 mg via INTRAVENOUS
  Filled 2012-01-26: qty 90

## 2012-01-26 NOTE — Patient Instructions (Addendum)
Ferumoxytol injection What is this medicine? FERUMOXYTOL is an iron complex. Iron is used to make healthy red blood cells, which carry oxygen and nutrients throughout the body. This medicine is used to treat iron deficiency anemia in people with chronic kidney disease. This medicine may be used for other purposes; ask your health care provider or pharmacist if you have questions. What should I tell my health care provider before I take this medicine? They need to know if you have any of these conditions: -anemia not caused by low iron levels -high levels of iron in the blood -magnetic resonance imaging (MRI) test scheduled -an unusual or allergic reaction to iron, other medicines, foods, dyes, or preservatives -pregnant or trying to get pregnant -breast-feeding How should I use this medicine? This medicine is for infusion into a vein. It is given by a health care professional in a hospital or clinic setting. Talk to your pediatrician regarding the use of this medicine in children. Special care may be needed. Overdosage: If you think you've taken too much of this medicine contact a poison control center or emergency room at once. Overdosage: If you think you have taken too much of this medicine contact a poison control center or emergency room at once. NOTE: This medicine is only for you. Do not share this medicine with others. What if I miss a dose? It is important not to miss your dose. Call your doctor or health care professional if you are unable to keep an appointment. What may interact with this medicine? This medicine may interact with the following medications: -other iron products This list may not describe all possible interactions. Give your health care provider a list of all the medicines, herbs, non-prescription drugs, or dietary supplements you use. Also tell them if you smoke, drink alcohol, or use illegal drugs. Some items may interact with your medicine. What should I watch  for while using this medicine? Visit your doctor or healthcare professional regularly. Tell your doctor or healthcare professional if your symptoms do not start to get better or if they get worse. You may need blood work done while you are taking this medicine. You may need to follow a special diet. Talk to your doctor. Foods that contain iron include: whole grains/cereals, dried fruits, beans, or peas, leafy green vegetables, and organ meats (liver, kidney). What side effects may I notice from receiving this medicine? Side effects that you should report to your doctor or health care professional as soon as possible: -allergic reactions like skin rash, itching or hives, swelling of the face, lips, or tongue -breathing problems -changes in blood pressure -feeling faint or lightheaded, falls -fever or chills -flushing, sweating, or hot feelings -swelling of the ankles or feet Side effects that usually do not require medical attention (Report these to your doctor or health care professional if they continue or are bothersome.): -diarrhea -headache -nausea, vomiting -stomach pain This list may not describe all possible side effects. Call your doctor for medical advice about side effects. You may report side effects to FDA at 1-800-FDA-1088. Where should I keep my medicine? This drug is given in a hospital or clinic and will not be stored at home. NOTE: This sheet is a summary. It may not cover all possible information. If you have questions about this medicine, talk to your doctor, pharmacist, or health care provider.  2012, [employer]vier/Gold Standard. (07/23/2008 9:48:25 PM)[facility] Discharge Instructions for Patients Receiving Chemotherapy  Today you received the following chemotherapy agents:  Oxaliplatin and Fluorouracil.  To help prevent nausea and vomiting after your treatment, we encourage you to take your nausea medication as prescribed.  If you develop nausea and vomiting  that is not controlled by your nausea medication, call the clinic. If it is after clinic hours your family physician or the after hours number for the clinic or go to the Emergency Department.   BELOW ARE SYMPTOMS THAT SHOULD BE REPORTED IMMEDIATELY:  *FEVER GREATER THAN 101.0 F  *CHILLS WITH OR WITHOUT FEVER  NAUSEA AND VOMITING THAT IS NOT CONTROLLED WITH YOUR NAUSEA MEDICATION  *UNUSUAL SHORTNESS OF BREATH  *UNUSUAL BRUISING OR BLEEDING  TENDERNESS IN MOUTH AND THROAT WITH OR WITHOUT PRESENCE OF ULCERS  *URINARY PROBLEMS  *BOWEL PROBLEMS  UNUSUAL RASH Items with * indicate a potential emergency and should be followed up as soon as possible.    I have been informed and understand all the instructions given to me. I know to contact the clinic, my physician, or go to the Emergency Department if any problems should occur. I do not have any questions at this time, but understand that I may call the clinic during office hours or the Patient Navigator at [phone] should I have any questions or need assistance in obtaining follow up care.    __________________________________________  _____________  __________ Signature of Patient or Authorized Representative            Date                   Time    __________________________________________ Nurse's Signature

## 2012-01-26 NOTE — Progress Notes (Signed)
Diagnosis: Stage IIIB (T2 N2M0) adenocarcinoma of the cecum.  Current therapy: patient to complete 12 cycles of FOLFOX today. Interim history: Teresa Morgan is in for followup. She will complete her chemotherapy today. She will done well with FOLFOX. Z. soma neuropathy. We will have to give her one break during the entire cycle of treatment. She has lost some hair. This will come back. She does have some tingling in the hands and feet. I have given her calcium and magnesium tablets neuropathy.  She's had no diarrhea. There's been no mouth sores. She has not noticed any rashes.  Her physical exam this is a well-developed well-nourished Asian female in no obvious distress. Vital signs show temperature of denies is 9. Pulse 84. Respiratory 16. Blood pressure 131/80. Weight is 166.  In exam shows no mucositis. There is no adenopathy in the neck. There is no scleral icterus. She has no palpable thyroid. Lungs are clear bilaterally. Cardiac exam regular rate and rhythm with no murmurs rubs or bruits. Abdominal exam soft good bowel sounds present well-healed laparotomy scar. There is no fluid wave. There is no guarding. Is a probable hepato-splenomegaly extremities shows no clubbing cyanosis or edema. Neurological exam shows no focal neurological deficits.  Laboratory studies white count is 4.4 hemoglobin 10.3 hematocrit 32.3 platelet count 89. Her impression :  Impression: Teresa Morgan is a 56yo Asian female with stage IIIB colon cancer. Her cancer at the cecum. She had 4 lymph nodes that were positive.  We will go ahead and give her last cycle of chemotherapy today. We will go ahead and get the oxide platinum back. I felt that she needed a break from oxaliplatin with her last cycle.  We will set her up with a followup CT scan in about 6 weeks. I'll see her back afterwards.  Of note, she will get iron today. She has not done this as previously ordered back we first saw her.

## 2012-01-28 ENCOUNTER — Other Ambulatory Visit: Payer: Self-pay | Admitting: Emergency Medicine

## 2012-01-28 ENCOUNTER — Ambulatory Visit (HOSPITAL_BASED_OUTPATIENT_CLINIC_OR_DEPARTMENT_OTHER): Payer: 59

## 2012-01-28 VITALS — BP 118/75 | HR 85 | Temp 98.5°F

## 2012-01-28 DIAGNOSIS — C180 Malignant neoplasm of cecum: Secondary | ICD-10-CM

## 2012-01-28 DIAGNOSIS — K2970 Gastritis, unspecified, without bleeding: Secondary | ICD-10-CM

## 2012-01-28 DIAGNOSIS — C775 Secondary and unspecified malignant neoplasm of intrapelvic lymph nodes: Secondary | ICD-10-CM

## 2012-01-28 MED ORDER — HEPARIN SOD (PORK) LOCK FLUSH 100 UNIT/ML IV SOLN
500.0000 [IU] | Freq: Once | INTRAVENOUS | Status: AC | PRN
  Administered 2012-01-28: 500 [IU]
  Filled 2012-01-28: qty 5

## 2012-01-28 MED ORDER — SODIUM CHLORIDE 0.9 % IJ SOLN
10.0000 mL | INTRAMUSCULAR | Status: DC | PRN
  Administered 2012-01-28: 10 mL
  Filled 2012-01-28: qty 10

## 2012-01-31 ENCOUNTER — Encounter: Payer: Self-pay | Admitting: *Deleted

## 2012-02-02 ENCOUNTER — Telehealth: Payer: Self-pay | Admitting: Hematology & Oncology

## 2012-02-02 ENCOUNTER — Other Ambulatory Visit: Payer: Self-pay | Admitting: Hematology & Oncology

## 2012-02-02 DIAGNOSIS — C180 Malignant neoplasm of cecum: Secondary | ICD-10-CM

## 2012-02-02 NOTE — Telephone Encounter (Signed)
Mailed 4-30 CT instruction sheet with letter and schedule for 03-20-12 MD

## 2012-02-02 NOTE — Telephone Encounter (Signed)
Pt aware of 4-30 CT to be NPO and drink at 8 and 9am. She is aware I have mailed out instructions and that she needs to pick up the contrast prior to.

## 2012-02-10 ENCOUNTER — Other Ambulatory Visit: Payer: Self-pay | Admitting: *Deleted

## 2012-02-10 NOTE — Telephone Encounter (Signed)
Pt called the [facility] pharmacy asking to have her Xanax rx.

## 2012-02-13 MED ORDER — ALPRAZOLAM 1 MG PO TABS: 1.0000 mg | ORAL_TABLET | Freq: Four times a day (QID) | ORAL | Status: DC | PRN

## 2012-02-21 ENCOUNTER — Ambulatory Visit: Payer: 59

## 2012-02-21 ENCOUNTER — Ambulatory Visit (INDEPENDENT_AMBULATORY_CARE_PROVIDER_SITE_OTHER): Payer: 59 | Admitting: Family Medicine

## 2012-02-21 VITALS — BP 114/77 | HR 73 | Temp 98.0°F | Resp 16 | Ht 69.0 in | Wt 165.4 lb

## 2012-02-21 DIAGNOSIS — M79609 Pain in unspecified limb: Secondary | ICD-10-CM

## 2012-02-21 DIAGNOSIS — M79671 Pain in right foot: Secondary | ICD-10-CM

## 2012-02-21 DIAGNOSIS — K090 Developmental odontogenic cysts: Secondary | ICD-10-CM

## 2012-02-21 MED ORDER — TRAMADOL HCL 50 MG PO TABS: 50.0000 mg | ORAL_TABLET | Freq: Three times a day (TID) | ORAL | Status: AC | PRN

## 2012-02-21 NOTE — Patient Instructions (Signed)
Wear firm soled or a postop shoe to support broken toe.

## 2012-02-21 NOTE — Progress Notes (Signed)
Subjective: Patient has peripheral neuropathy from her chemotherapy, so has been a little bit unsteady. She hit her right foot into the wall at about noon today started bruising at the base of the fourth toe. She came in for evaluation of it. It does cause some pain. Her sensation is not very good in that foot.  Objective: Right fourth toe is slightly laterally deviated, though this may be its natural position. There is ecchymosis at the base of the toe underlying the MTP joint. It is quite tender to touch at that area.  Assessment: Right foot contusion and pain, rule out fracture  Plan: X-ray right foot   [facility] reading (PRIMARY) by  Dr. Alwyn Ren Patient has a fracture of the proximal aspect of the proximal phalanx of the fourth right toe    Off work 2 days. Tylenol for pain. Wear firm soled or postop shoe. Return in 10 days. Sooner if problems. Elevate and ice as tolerated.

## 2012-02-23 ENCOUNTER — Other Ambulatory Visit: Payer: Self-pay | Admitting: *Deleted

## 2012-02-23 DIAGNOSIS — C189 Malignant neoplasm of colon, unspecified: Secondary | ICD-10-CM

## 2012-02-23 MED ORDER — OXYCODONE-ACETAMINOPHEN 7.5-325 MG PO TABS: 1.0000 | ORAL_TABLET | Freq: Four times a day (QID) | ORAL | Status: DC | PRN

## 2012-02-23 NOTE — Telephone Encounter (Signed)
Pt left message on voicemail asking for an oxycodone refill and Redicap contrast. She stated she checked with the pharmacy and they didn't know anything about it. Returned call but had to leave a voicemail. Explained that the contrast needs to be picked up at Radiology but she has come to the office to p/u her rx. To call back if she has questions.

## 2012-03-02 ENCOUNTER — Ambulatory Visit: Payer: 59

## 2012-03-02 ENCOUNTER — Ambulatory Visit (INDEPENDENT_AMBULATORY_CARE_PROVIDER_SITE_OTHER): Payer: 59 | Admitting: Family Medicine

## 2012-03-02 VITALS — BP 108/73 | HR 73 | Temp 98.0°F | Resp 16 | Ht 68.0 in | Wt 166.0 lb

## 2012-03-02 DIAGNOSIS — M79674 Pain in right toe(s): Secondary | ICD-10-CM

## 2012-03-02 DIAGNOSIS — S92501A Displaced unspecified fracture of right lesser toe(s), initial encounter for closed fracture: Secondary | ICD-10-CM

## 2012-03-02 DIAGNOSIS — S92919A Unspecified fracture of unspecified toe(s), initial encounter for closed fracture: Secondary | ICD-10-CM

## 2012-03-02 DIAGNOSIS — M79609 Pain in unspecified limb: Secondary | ICD-10-CM

## 2012-03-02 NOTE — Patient Instructions (Signed)
Continue buddy taping and wearing the postop shoe.

## 2012-03-02 NOTE — Progress Notes (Signed)
Subjective: Fourth toe of the right foot still hurts. She is keeping it buddy taped.  Objective: Tender proximal phalanx of the fourth toe  Plan: X-rays to check on alignment  [facility] reading (PRIMARY) by  Dr. Vanetta Shawl shows a slightly worse shinning of the fracture line. I think it is still adequate.  Charted in one month. Continue buddy taping and wearing the postop shoe.

## 2012-03-13 ENCOUNTER — Other Ambulatory Visit: Payer: Self-pay | Admitting: *Deleted

## 2012-03-13 ENCOUNTER — Ambulatory Visit (HOSPITAL_BASED_OUTPATIENT_CLINIC_OR_DEPARTMENT_OTHER)
Admission: RE | Admit: 2012-03-13 | Discharge: 2012-03-13 | Disposition: A | Discharge status: Home / Self Care | Payer: 59 | Source: Ambulatory Visit | Attending: Hematology & Oncology | Admitting: Hematology & Oncology

## 2012-03-13 ENCOUNTER — Ambulatory Visit (HOSPITAL_BASED_OUTPATIENT_CLINIC_OR_DEPARTMENT_OTHER): Payer: 59

## 2012-03-13 VITALS — BP 128/79 | HR 66 | Temp 97.0°F

## 2012-03-13 DIAGNOSIS — Z9049 Acquired absence of other specified parts of digestive tract: Secondary | ICD-10-CM

## 2012-03-13 DIAGNOSIS — C180 Malignant neoplasm of cecum: Secondary | ICD-10-CM

## 2012-03-13 DIAGNOSIS — C189 Malignant neoplasm of colon, unspecified: Secondary | ICD-10-CM

## 2012-03-13 DIAGNOSIS — Z9221 Personal history of antineoplastic chemotherapy: Secondary | ICD-10-CM | POA: Insufficient documentation

## 2012-03-13 DIAGNOSIS — R911 Solitary pulmonary nodule: Secondary | ICD-10-CM | POA: Insufficient documentation

## 2012-03-13 DIAGNOSIS — C775 Secondary and unspecified malignant neoplasm of intrapelvic lymph nodes: Secondary | ICD-10-CM

## 2012-03-13 DIAGNOSIS — Z452 Encounter for adjustment and management of vascular access device: Secondary | ICD-10-CM

## 2012-03-13 MED ORDER — SODIUM CHLORIDE 0.9 % IJ SOLN
10.0000 mL | INTRAMUSCULAR | Status: DC | PRN
  Administered 2012-03-13: 10 mL via INTRAVENOUS
  Filled 2012-03-13: qty 10

## 2012-03-13 MED ORDER — HEPARIN SOD (PORK) LOCK FLUSH 100 UNIT/ML IV SOLN
500.0000 [IU] | Freq: Once | INTRAVENOUS | Status: AC
  Administered 2012-03-13: 500 [IU] via INTRAVENOUS
  Filled 2012-03-13: qty 5

## 2012-03-13 MED ORDER — GABAPENTIN 300 MG PO CAPS: 300.0000 mg | ORAL_CAPSULE | Freq: Three times a day (TID) | ORAL | Status: DC

## 2012-03-13 MED ORDER — IOHEXOL 300 MG/ML  SOLN
100.0000 mL | Freq: Once | INTRAMUSCULAR | Status: AC | PRN
  Administered 2012-03-13: 100 mL via INTRAVENOUS

## 2012-03-13 MED ORDER — OXYCODONE-ACETAMINOPHEN 7.5-325 MG PO TABS: 1.0000 | ORAL_TABLET | Freq: Four times a day (QID) | ORAL | Status: DC | PRN

## 2012-03-20 ENCOUNTER — Ambulatory Visit: Payer: 59 | Admitting: Hematology & Oncology

## 2012-03-20 ENCOUNTER — Ambulatory Visit (HOSPITAL_BASED_OUTPATIENT_CLINIC_OR_DEPARTMENT_OTHER): Payer: 59 | Admitting: Hematology & Oncology

## 2012-03-20 ENCOUNTER — Other Ambulatory Visit: Payer: 59 | Admitting: Lab

## 2012-03-20 ENCOUNTER — Other Ambulatory Visit (HOSPITAL_BASED_OUTPATIENT_CLINIC_OR_DEPARTMENT_OTHER): Payer: 59 | Admitting: Lab

## 2012-03-20 ENCOUNTER — Encounter: Payer: Self-pay | Admitting: Hematology & Oncology

## 2012-03-20 VITALS — BP 110/72 | HR 93 | Temp 97.2°F | Ht 68.0 in | Wt 167.0 lb

## 2012-03-20 DIAGNOSIS — C180 Malignant neoplasm of cecum: Secondary | ICD-10-CM

## 2012-03-20 DIAGNOSIS — R911 Solitary pulmonary nodule: Secondary | ICD-10-CM

## 2012-03-20 DIAGNOSIS — G589 Mononeuropathy, unspecified: Secondary | ICD-10-CM

## 2012-03-20 DIAGNOSIS — F419 Anxiety disorder, unspecified: Secondary | ICD-10-CM

## 2012-03-20 DIAGNOSIS — R112 Nausea with vomiting, unspecified: Secondary | ICD-10-CM

## 2012-03-20 LAB — CBC WITH DIFFERENTIAL (CANCER CENTER ONLY)
BASO#: 0 10*3/uL (ref 0.0–0.2)
Eosinophils Absolute: 0.2 10*3/uL (ref 0.0–0.5)
HGB: 12.1 g/dL (ref 11.6–15.9)
LYMPH#: 1.7 10*3/uL (ref 0.9–3.3)
MONO#: 0.4 10*3/uL (ref 0.1–0.9)
NEUT#: 2.2 10*3/uL (ref 1.5–6.5)
RBC: 5.06 10*6/uL (ref 3.70–5.32)

## 2012-03-20 MED ORDER — ALPRAZOLAM 1 MG PO TABS: 1.0000 mg | ORAL_TABLET | Freq: Four times a day (QID) | ORAL | Status: DC | PRN

## 2012-03-20 MED ORDER — LORAZEPAM 1 MG PO TABS: 1.0000 mg | ORAL_TABLET | Freq: Three times a day (TID) | ORAL | Status: DC

## 2012-03-20 NOTE — Progress Notes (Signed)
This office note has been dictated.

## 2012-03-20 NOTE — Progress Notes (Signed)
CC:   Jyothi Elsie Amis, MD, Harriette Bouillon, MD  DIAGNOSIS:  Stage IIIB (T2 N2a M0) adenocarcinoma of the cecum.  CURRENT THERAPY:  The patient has completed her adjuvant chemotherapy with FOLFOX.  INTERIM HISTORY:  Ms. Casebolt comes in for followup.  Neuropathy is her biggest problem right now.  She still has neuropathy.  She actually broke I think a toe on her left foot.  She really has no sensation in her feet or fingers.  I did go ahead and put her on some Neurontin 300 mg t.i.d.  She has had no problems with fatigue or weakness.  She went back to work full-time.  I told her that she really needs to take it easy and go slowly with work.  I think that her working 4-6 hours at most most probably is more amenable.  We did go ahead and do a scan on her.  This is a post-chemotherapy scan. This was done on the 30th.  The CT scan showed some peripheral nodules in the right upper lobe and right lower lobe.  The inferior nodules are similar to the ones seen on a CT scan of the abdomen and pelvis back in July 2012.  There is a 5 mm nodule in the right lower lobe.  A 6 mm nodule right middle lobe.  A 5 mm nodule is noted in the right upper lobe.  Below the diaphragm, everything looks okay.  She has not had any problems with cough or shortness breath.  She has had no fevers, sweats or chills.  There has been no diarrhea.  PHYSICAL EXAMINATION:  This is a well-developed, well-nourished Asian female in no obvious distress.  Vital signs:  97.7, pulse 93, respiratory rate 20, blood pressure 110/72.  Weight is 167.  Head and neck exam shows a normocephalic, atraumatic skull.  There are no ocular or oral lesions.  There are no palpable cervical or supraclavicular lymph nodes.  Lungs:  Clear bilaterally.  Cardiac:  Regular rate and rhythm with a normal S1 and S2.  There are no murmurs, rubs or bruits. Abdomen:  Soft with good bowel sounds.  There is a well-healed laparoscopy scar.  There is no  fluid wave.  There is no guarding or rebound tenderness.  There is no palpable hepatosplenomegaly. Extremities:  Show no clubbing, cyanosis or edema.  There is some slight hyperesthesia in her feet.  She has the walking boot on her left foot. Neurologic:  Shows no focal neurological deficits outside of the hyperesthesia in her feet and hands.  Skin:  No rashes, ecchymosis or petechia.  LABORATORY STUDIES:  White cell count 4.5, hemoglobin 12.1, hematocrit 37, platelet count is 117.  MCV is 73.  IMPRESSION:  Ms. Braid is a 56 year old Asian female with stage IIB adenocarcinoma of the cecum.  She underwent a resection.  She had 4 positive lymph nodes.  She completed her adjuvant chemotherapy back in mid-March.  She really did well with treatment.  I think we only had 1 or 2 weeks of delay.  I am not sure what to make of these nodules.  I would be surprised if they were related to malignancy.  One would think that she would have liver metastasis before she would have pulmonary metastasis.  With cecal cancers, they typically go to the liver initially and then spread from there.  I do think we have to follow up on these lesions.  I will set her up for a CT scan in  about 6 weeks or so.  We will see her back after the next CT scan is done.  This will be a CT of the chest.    ______________________________ Josph Macho, M.D. PRE/MEDQ  D:  03/20/2012  T:  03/20/2012  Job:  [id number]

## 2012-03-21 LAB — COMPREHENSIVE METABOLIC PANEL
Albumin: 4.2 g/dL (ref 3.5–5.2)
BUN: 16 mg/dL (ref 6–23)
Calcium: 9.2 mg/dL (ref 8.4–10.5)
Chloride: 104 mEq/L (ref 96–112)
Glucose, Bld: 114 mg/dL — ABNORMAL HIGH (ref 70–99)
Potassium: 3.8 mEq/L (ref 3.5–5.3)

## 2012-03-21 LAB — CEA: CEA: 1.7 ng/mL (ref 0.0–5.0)

## 2012-04-18 ENCOUNTER — Other Ambulatory Visit: Payer: Self-pay | Admitting: *Deleted

## 2012-04-18 MED ORDER — OXYCODONE-ACETAMINOPHEN 10-325 MG PO TABS: ORAL_TABLET | ORAL | Status: DC

## 2012-04-18 MED ORDER — DULOXETINE HCL 60 MG PO CPEP: 60.0000 mg | ORAL_CAPSULE | Freq: Every day | ORAL | Status: DC

## 2012-04-18 NOTE — Telephone Encounter (Signed)
Pt called in tears stating she was having increased joint pain and the medication she took for the tingling in her feet is no longer working. Reviewed with Dr Myna Hidalgo. To increase pain medication from Endocet 7.5/325 to 10/325 and to start Cymbalta in place of Neurontin. Teresa Morgan made aware of the changes and will have her dgtr come pick up the rx.

## 2012-04-19 ENCOUNTER — Other Ambulatory Visit: Payer: Self-pay | Admitting: *Deleted

## 2012-04-19 MED ORDER — GABAPENTIN 600 MG PO TABS: 600.0000 mg | ORAL_TABLET | Freq: Four times a day (QID) | ORAL | Status: DC

## 2012-04-30 ENCOUNTER — Ambulatory Visit (HOSPITAL_BASED_OUTPATIENT_CLINIC_OR_DEPARTMENT_OTHER)
Admission: RE | Admit: 2012-04-30 | Discharge: 2012-04-30 | Disposition: A | Discharge status: Home / Self Care | Payer: 59 | Source: Ambulatory Visit | Attending: Hematology & Oncology | Admitting: Hematology & Oncology

## 2012-04-30 DIAGNOSIS — C180 Malignant neoplasm of cecum: Secondary | ICD-10-CM | POA: Insufficient documentation

## 2012-04-30 DIAGNOSIS — R918 Other nonspecific abnormal finding of lung field: Secondary | ICD-10-CM | POA: Insufficient documentation

## 2012-04-30 MED ORDER — IOHEXOL 300 MG/ML  SOLN
80.0000 mL | Freq: Once | INTRAMUSCULAR | Status: AC | PRN
  Administered 2012-04-30: 80 mL via INTRAVENOUS

## 2012-05-07 ENCOUNTER — Ambulatory Visit: Payer: 59

## 2012-05-07 ENCOUNTER — Other Ambulatory Visit (HOSPITAL_BASED_OUTPATIENT_CLINIC_OR_DEPARTMENT_OTHER): Payer: 59 | Admitting: Lab

## 2012-05-07 ENCOUNTER — Ambulatory Visit (HOSPITAL_BASED_OUTPATIENT_CLINIC_OR_DEPARTMENT_OTHER): Payer: 59 | Admitting: Hematology & Oncology

## 2012-05-07 VITALS — BP 135/81 | HR 63 | Temp 97.0°F | Ht 68.0 in | Wt 161.0 lb

## 2012-05-07 DIAGNOSIS — C180 Malignant neoplasm of cecum: Secondary | ICD-10-CM

## 2012-05-07 DIAGNOSIS — R918 Other nonspecific abnormal finding of lung field: Secondary | ICD-10-CM

## 2012-05-07 DIAGNOSIS — F17200 Nicotine dependence, unspecified, uncomplicated: Secondary | ICD-10-CM

## 2012-05-07 DIAGNOSIS — D500 Iron deficiency anemia secondary to blood loss (chronic): Secondary | ICD-10-CM

## 2012-05-07 LAB — CBC WITH DIFFERENTIAL (CANCER CENTER ONLY)
BASO%: 0.2 % (ref 0.0–2.0)
EOS%: 1.6 % (ref 0.0–7.0)
LYMPH#: 1.5 10*3/uL (ref 0.9–3.3)
LYMPH%: 27.9 % (ref 14.0–48.0)
MONO#: 0.3 10*3/uL (ref 0.1–0.9)
Platelets: 128 10*3/uL — ABNORMAL LOW (ref 145–400)
RDW: 13.8 % (ref 11.1–15.7)
WBC: 5.5 10*3/uL (ref 3.9–10.0)

## 2012-05-07 LAB — COMPREHENSIVE METABOLIC PANEL
ALT: 18 U/L (ref 0–35)
AST: 23 U/L (ref 0–37)
CO2: 26 mEq/L (ref 19–32)
Sodium: 139 mEq/L (ref 135–145)
Total Bilirubin: 0.4 mg/dL (ref 0.3–1.2)
Total Protein: 6.7 g/dL (ref 6.0–8.3)

## 2012-05-07 LAB — IRON AND TIBC: %SAT: 44 % (ref 20–55)

## 2012-05-07 LAB — FERRITIN: Ferritin: 227 ng/mL (ref 10–291)

## 2012-05-07 MED ORDER — HEPARIN SOD (PORK) LOCK FLUSH 100 UNIT/ML IV SOLN
500.0000 [IU] | Freq: Once | INTRAVENOUS | Status: AC | PRN
  Administered 2012-05-07: 500 [IU]
  Filled 2012-05-07: qty 5

## 2012-05-07 MED ORDER — SODIUM CHLORIDE 0.9 % IJ SOLN
10.0000 mL | INTRAMUSCULAR | Status: DC | PRN
  Administered 2012-05-07: 10 mL
  Filled 2012-05-07: qty 10

## 2012-05-07 NOTE — Progress Notes (Signed)
This office note has been dictated.

## 2012-05-07 NOTE — Patient Instructions (Signed)

## 2012-05-08 ENCOUNTER — Encounter (INDEPENDENT_AMBULATORY_CARE_PROVIDER_SITE_OTHER): Payer: 59 | Admitting: General Surgery

## 2012-05-08 NOTE — Progress Notes (Signed)
CC:   Teresa Elsie Amis, MD, Teresa Bouillon, MD  DIAGNOSIS: 1. Stage IIIB (T2 N2a M0) adenocarcinoma of the cecum. 2. Neuropathy secondary to chemotherapy. 3. Chronic inflammatory sites and infection, by chest CT, in right     lung.  INTERIM HISTORY:  Teresa Morgan comes in for followup.  We went ahead and repeated her CT of the chest.  This was to follow up with the changes that were seen initially on a CT scan done, I think, back in April.  The CT scan did not show any evidence of recurrent disease.  However, the radiologist commented that there were persistent changes in her right middle lobe that appeared to be inflammatory and infectious.  As such, I think we are going to have to get her over to Pulmonary to be followed.  She is a smoker.  She has some erythematous type changes.  I suppose she may have some colonization going on.  She is working.  This has been a little bit difficult for her because of the neuropathy.  She has had no diarrhea.  She has had no bleeding.  There is no fever. She has had no headache.  She is on Neurontin 600 mg 4 times a day now.  This may help a little bit.  She has quite a bit of pain at nighttime in her feet and hands.  PHYSICAL EXAMINATION:  This is a well-developed, well-nourished Filipino woman in no obvious distress.  Vital signs:  Temperature of 97, pulse 63, respiratory rate 18, blood pressure 135/81.  Weight is 161.  Head and neck:  Normocephalic, atraumatic skull.  There are no ocular or oral lesions.  There are no palpable cervical or supraclavicular lymph nodes. Lungs:  Clear bilaterally.  She has no rales, wheezes or rhonchi. Cardiac: Regular rate and rhythm with a normal S1 and S2.  There are no murmurs, rubs or bruits.  Abdomen:  Soft.  She has well-healed laparotomy scar.  There is no fluid wave.  There is no palpable hepatosplenomegaly.  Extremities:  Hyperesthesia in her feet.  She has good pulses in her distal extremities.   She has no joint erythema or warmth.  She has good range of motion of her joints.  Back:  No tenderness over the spine, ribs, or hips.  Skin:  No rashes, ecchymosis or petechia.  LABORATORY STUDIES:  White cell count 5.5, hemoglobin 12.5, hematocrit 38.1, platelet count is 128.  IMPRESSION:  Teresa Morgan is a 56 year old Filipino female with stage IIIB adenocarcinoma of the cecum.  She had 4 positive lymph nodes.  She underwent adjuvant chemotherapy with a 12 cycles of FOLFOX.  She does have neuropathy.  Hopefully, this neuropathy will improve.  It does not look like we have any problems with recurrence of disease. However, I think that we do need to see what is going on with the right lung.  We will refer her to Pulmonary Medicine and see if she needs to have a bronchoscopy with biopsies or if just follow up with scans is indicated.  From my point of view, we will repeat scans on her in 3 months.  She will get her Port-A-Cath flushed every 6 weeks.  I do want to keep her Port-A-Cath in for now.    ______________________________ Josph Macho, M.D. PRE/MEDQ  D:  05/07/2012  T:  05/08/2012  Job:  [id number]

## 2012-05-11 ENCOUNTER — Telehealth: Payer: Self-pay | Admitting: Hematology & Oncology

## 2012-05-11 NOTE — Telephone Encounter (Signed)
Pt aware of 8-7 flush, 9-11 CT to drink at 830, 930 am and to be NPO 4 hours and MD on 9-18. She also aware Dr. Myna Hidalgo is trying to reach Dr. Delford Field to be seen sooner than 06-18-12

## 2012-05-14 ENCOUNTER — Encounter (INDEPENDENT_AMBULATORY_CARE_PROVIDER_SITE_OTHER): Payer: Self-pay | Admitting: General Surgery

## 2012-05-14 ENCOUNTER — Other Ambulatory Visit (INDEPENDENT_AMBULATORY_CARE_PROVIDER_SITE_OTHER): Payer: Self-pay | Admitting: General Surgery

## 2012-05-14 ENCOUNTER — Ambulatory Visit (INDEPENDENT_AMBULATORY_CARE_PROVIDER_SITE_OTHER): Payer: 59 | Admitting: General Surgery

## 2012-05-14 VITALS — BP 128/84 | HR 66 | Temp 97.8°F | Resp 16 | Ht 69.0 in | Wt 161.4 lb

## 2012-05-14 DIAGNOSIS — C189 Malignant neoplasm of colon, unspecified: Secondary | ICD-10-CM

## 2012-05-14 DIAGNOSIS — C180 Malignant neoplasm of cecum: Secondary | ICD-10-CM

## 2012-05-14 NOTE — Patient Instructions (Addendum)
Get follow up colonoscopy with Dr. Loreta Ave  Follow up with me in 4-5 months.    Will get you offset with my appointments and Dr. Gustavo Lah appts.

## 2012-05-14 NOTE — Progress Notes (Signed)
HISTORY: Pt is here in follow up for colon cancer.  She had laparoscopic right hemicolectomy around 1 year ago.  She is not having any rectal bleeding or changes in her bowel habits.  She denies nausea/vomiting.  She is having neuropathy in her fingers and toes.  She is on neurontin and oxycodone for this.  She is back at work.  She has also had some issues with her chest CT that look infectious or inflammatory, but may reflect neoplasm.  She has been referred to pulmonology for evaluation.     PERTINENT REVIEW OF SYSTEMS: Otherwise negative.    EXAM: Head: Normocephalic and atraumatic.  Eyes:  Conjunctivae are normal. Pupils are equal, round, and reactive to light. No scleral icterus.  Neck:  Normal range of motion. Neck supple. No tracheal deviation present. No thyromegaly present.  Resp: No respiratory distress, normal effort. Abd:  Abdomen is soft, non distended and non tender. No masses are palpable.  There is no rebound and no guarding. No hernia is present at incision.   Neurological: Alert and oriented to person, place, and time. Coordination normal.  Skin: Skin is warm and dry. No rash noted. No diaphoretic. No erythema. No pallor.  Psychiatric: Normal mood and affect. Normal behavior. Judgment and thought content normal.      ASSESSMENT AND PLAN:   Cecal cancer, pT3N2a Pt without clinical evidence of disease.  Will try to offset appointments with Dr. Myna Hidalgo so that she is not coming to appts in the same week.    She will be due for 1 year colonoscopy later this month with Dr. Kenna Gilbert office.    I will see her back in 4-5 months.        Maudry Diego, MD Surgical Oncology, General & Endocrine Surgery [facility], Marlyne Beards, MD Almond Lint, MD

## 2012-05-14 NOTE — Assessment & Plan Note (Signed)
Pt without clinical evidence of disease.  Will try to offset appointments with Dr. Myna Hidalgo so that she is not coming to appts in the same week.    She will be due for 1 year colonoscopy later this month with Dr. Kenna Gilbert office.    I will see her back in 4-5 months.

## 2012-05-21 ENCOUNTER — Other Ambulatory Visit: Payer: Self-pay | Admitting: *Deleted

## 2012-05-21 MED ORDER — OXYCODONE-ACETAMINOPHEN 10-325 MG PO TABS: ORAL_TABLET | ORAL | Status: DC

## 2012-05-21 NOTE — Telephone Encounter (Signed)
Pt called requesting a Percocet refill. Last filled approx 1 month ago. Will route to Dr Myna Hidalgo for approval then place at the front desk for pick up for tomorrow.

## 2012-06-18 ENCOUNTER — Encounter: Payer: Self-pay | Admitting: Critical Care Medicine

## 2012-06-18 ENCOUNTER — Ambulatory Visit (INDEPENDENT_AMBULATORY_CARE_PROVIDER_SITE_OTHER): Payer: 59 | Admitting: Critical Care Medicine

## 2012-06-18 VITALS — BP 106/68 | HR 62 | Temp 98.2°F | Ht 69.0 in | Wt 160.0 lb

## 2012-06-18 DIAGNOSIS — J438 Other emphysema: Secondary | ICD-10-CM

## 2012-06-18 DIAGNOSIS — R918 Other nonspecific abnormal finding of lung field: Secondary | ICD-10-CM | POA: Insufficient documentation

## 2012-06-18 DIAGNOSIS — J449 Chronic obstructive pulmonary disease, unspecified: Secondary | ICD-10-CM

## 2012-06-18 DIAGNOSIS — J439 Emphysema, unspecified: Secondary | ICD-10-CM

## 2012-06-18 HISTORY — DX: Emphysema, unspecified: J43.9

## 2012-06-18 NOTE — Assessment & Plan Note (Signed)
Bilateral RML, L lingular tree in bud infiltrate c/w MAC .  Doubt CA.  Pt is not symptomatic enough to warrant FOB Plan Observation for now Repeat CT chest in 6 months

## 2012-06-18 NOTE — Progress Notes (Signed)
Subjective:    Patient ID: Teresa Morgan, female    DOB: 04/17/1956, 56 y.o.   MRN: [id number]  Shortness of Breath This is a chronic problem. The current episode started more than 1 year ago. Episode frequency: exertional only: at work, ADLs, long walks, up hills. The problem has been unchanged. Associated symptoms include rhinorrhea. Pertinent negatives include no abdominal pain, chest pain, claudication, coryza, ear pain, fever, headaches, hemoptysis, leg pain, leg swelling, neck pain, orthopnea, PND, rash, sore throat, sputum production, swollen glands, syncope, vomiting or wheezing. The symptoms are aggravated by exercise and any activity. Risk factors include smoking. She has tried nothing for the symptoms. There is no history of allergies, aspirin allergies, asthma, bronchiolitis, CAD, chronic lung disease, COPD, DVT, a heart failure, PE, pneumonia or a recent surgery.    Dx colon (cecal)  T3N2a  CA 05/2011.  R side near appx.  Pt had surgery.  Post op Chemorx.  +LN involved. Not on chemorx , last Rx march 2013.   Pt notes dyspnea since smoker and quit 6 yrs ago  Past Medical History  Diagnosis Date  . Anemia   . Anxiety   . Arthritis   . Depression   . GERD (gastroesophageal reflux disease)   . Ulcer   . Colon cancer   . COPD with emphysema 06/18/2012     Family History  Problem Relation Age of Onset  . Diabetes Mother   . Colon cancer Cousin      History   Social History  . Marital Status: Married    Spouse Name: N/A    Number of Children: 3  . 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 Smoking status: Former Smoker -- 1.0 packs/day for 25 years    Types: Cigarettes    Quit date: 06/08/2005  . Smokeless tobacco: Never Used  . Alcohol Use: 0.6 oz/week    1 Glasses of wine per week     social  . Drug Use: No  . Sexually Active: Not Currently    Birth Control/ Protection: Surgical   Other Topics Concern   . Not on file   Social History Narrative  . No narrative on file     Allergies  Allergen Reactions  . Aspirin     Upset stomach  . Cymbalta (Duloxetine Hcl) Swelling    Swollen lips      Outpatient Prescriptions Prior to Visit  Medication Sig Dispense Refill  . ALPRAZolam (XANAX) 1 MG tablet Take 1 tablet (1 mg total) by mouth every 6 (six) hours as needed.  90 tablet  3  . B Complex-C (B-COMPLEX WITH VITAMIN C) tablet Take 1 tablet by mouth daily.        . Calcium-Vitamin D 600-125 MG-UNIT TABS Take by mouth daily.        Marland Kitchen gabapentin (NEURONTIN) 600 MG tablet Take 1 tablet (600 mg total) by mouth 4 (four) times daily.  120 tablet  3  . lansoprazole (PREVACID) 30 MG capsule Take 1 capsule (30 mg total) by mouth daily.  30 capsule  3  . lidocaine-prilocaine (EMLA) cream Apply 1 application topically as needed.        Marland Kitchen LORazepam (ATIVAN) 1 MG tablet Take 1 tablet (1 mg total) by mouth every 8 (eight) hours.  60 tablet  0  . Magnesium Oxide (PHILLIPS) 500 MG (LAX) TABS Take 500 mg by mouth daily.        Marland Kitchen  Multiple Vitamin (MULTIVITAMIN) tablet Take 1 tablet by mouth daily.        Marland Kitchen oxyCODONE-acetaminophen (PERCOCET) 10-325 MG per tablet 1 - 2 tabs every 4 hours as needed for pain  120 tablet  0  . TURMERIC PO Take by mouth daily.        . vitamin E 200 UNIT capsule Take 200 Units by mouth daily.           Review of Systems  Constitutional: Negative for fever, chills, diaphoresis, activity change, appetite change, fatigue and unexpected weight change.  HENT: Positive for rhinorrhea. Negative for hearing loss, ear pain, nosebleeds, congestion, sore throat, facial swelling, sneezing, mouth sores, trouble swallowing, neck pain, neck stiffness, dental problem, voice change, postnasal drip, sinus pressure, tinnitus and ear discharge.   Eyes: Negative for photophobia, discharge, itching and visual disturbance.  Respiratory: Positive for shortness of breath. Negative for apnea, cough,  hemoptysis, sputum production, choking, chest tightness, wheezing and stridor.   Cardiovascular: Negative for chest pain, palpitations, orthopnea, claudication, leg swelling, syncope and PND.  Gastrointestinal: Positive for constipation. Negative for nausea, vomiting, abdominal pain, blood in stool and abdominal distention.  Genitourinary: Negative for dysuria, urgency, frequency, hematuria, flank pain, decreased urine volume and difficulty urinating.  Musculoskeletal: Positive for back pain. Negative for myalgias, joint swelling, arthralgias and gait problem.  Skin: Negative for color change, pallor and rash.  Neurological: Negative for dizziness, tremors, seizures, syncope, speech difficulty, weakness, light-headedness, numbness and headaches.  Hematological: Negative for adenopathy. Bruises/bleeds easily.  Psychiatric/Behavioral: Negative for confusion, disturbed wake/sleep cycle and agitation. The patient is nervous/anxious.        Objective:   Physical Exam Filed Vitals:   06/18/12 0851  BP: 106/68  Pulse: 62  Temp: 98.2 F (36.8 C)  TempSrc: Oral  Height: 5\' 9"  (1.753 m)  Weight: 160 lb (72.576 kg)  SpO2: 98%    Gen: Pleasant, well-nourished, in no distress,  normal affect  ENT: No lesions,  mouth clear,  oropharynx clear, no postnasal drip  Neck: No JVD, no TMG, no carotid bruits  Lungs: No use of accessory muscles, no dullness to percussion,distant BS  Cardiovascular: RRR, heart sounds normal, no murmur or gallops, no peripheral edema  Abdomen: soft and NT, no HSM,  BS normal  Musculoskeletal: No deformities, no cyanosis or clubbing  Neuro: alert, non focal  Skin: Warm, no lesions or rashes  CT chest 2013 reviewed.         Assessment & Plan:   Pulmonary infiltrate on chest x-ray Bilateral RML, L lingular tree in bud infiltrate c/w MAC .  Doubt CA.  Pt is not symptomatic enough to warrant FOB Plan Observation for now Repeat CT chest in 6  months   COPD with emphysema Centrilobular emphysema on CT Chest 04/2012 Chronic exertional dyspnea, former smoker Plan PFTs to be scheduled Consider Rx with LABA or long acting anticholinergic after pfts return   Updated Medication List Outpatient Encounter Prescriptions as of 06/18/2012  Medication Sig Dispense Refill  . ALPRAZolam (XANAX) 1 MG tablet Take 1 tablet (1 mg total) by mouth every 6 (six) hours as needed.  90 tablet  3  . B Complex-C (B-COMPLEX WITH VITAMIN C) tablet Take 1 tablet by mouth daily.        . Calcium-Vitamin D 600-125 MG-UNIT TABS Take by mouth daily.        Marland Kitchen gabapentin (NEURONTIN) 600 MG tablet Take 1 tablet (600 mg total) by mouth 4 (four) times daily.  120 tablet  3  . lansoprazole (PREVACID) 30 MG capsule Take 1 capsule (30 mg total) by mouth daily.  30 capsule  3  . lidocaine-prilocaine (EMLA) cream Apply 1 application topically as needed.        Marland Kitchen LORazepam (ATIVAN) 1 MG tablet Take 1 tablet (1 mg total) by mouth every 8 (eight) hours.  60 tablet  0  . Magnesium Oxide (PHILLIPS) 500 MG (LAX) TABS Take 500 mg by mouth daily.        . Multiple Vitamin (MULTIVITAMIN) tablet Take 1 tablet by mouth daily.        Marland Kitchen oxyCODONE-acetaminophen (PERCOCET) 10-325 MG per tablet 1 - 2 tabs every 4 hours as needed for pain  120 tablet  0  . TURMERIC PO Take by mouth daily.        . vitamin E 200 UNIT capsule Take 200 Units by mouth daily.

## 2012-06-18 NOTE — Patient Instructions (Signed)
No change in medications for now Pulmonary function studies will be obtained I will call results and then decide on therapy Return 2 months

## 2012-06-18 NOTE — Assessment & Plan Note (Signed)
Centrilobular emphysema on CT Chest 04/2012 Chronic exertional dyspnea, former smoker Plan PFTs to be scheduled Consider Rx with LABA or long acting anticholinergic after pfts return

## 2012-06-20 ENCOUNTER — Ambulatory Visit (HOSPITAL_BASED_OUTPATIENT_CLINIC_OR_DEPARTMENT_OTHER): Payer: 59

## 2012-06-20 VITALS — BP 117/69 | HR 73 | Temp 97.2°F | Resp 16

## 2012-06-20 DIAGNOSIS — Z452 Encounter for adjustment and management of vascular access device: Secondary | ICD-10-CM

## 2012-06-20 DIAGNOSIS — C180 Malignant neoplasm of cecum: Secondary | ICD-10-CM

## 2012-06-20 MED ORDER — HEPARIN SOD (PORK) LOCK FLUSH 100 UNIT/ML IV SOLN
500.0000 [IU] | Freq: Once | INTRAVENOUS | Status: AC
  Administered 2012-06-20: 500 [IU] via INTRAVENOUS
  Filled 2012-06-20: qty 5

## 2012-06-20 MED ORDER — SODIUM CHLORIDE 0.9 % IJ SOLN
10.0000 mL | INTRAMUSCULAR | Status: DC | PRN
  Administered 2012-06-20: 10 mL via INTRAVENOUS
  Filled 2012-06-20: qty 10

## 2012-06-20 NOTE — Progress Notes (Signed)
Teresa Morgan presented for Portacath access and flush. Proper placement of portacath confirmed by CXR. Portacath located in the left chest wall accessed with  H 20 needle. Clean, Dry and Intact Good blood return present. Portacath flushed with 20ml NS and 500U/5ml Heparin per protocol and needle removed intact. Procedure without incident. Patient tolerated procedure well.   

## 2012-06-20 NOTE — Patient Instructions (Signed)

## 2012-06-26 ENCOUNTER — Other Ambulatory Visit: Payer: Self-pay | Admitting: *Deleted

## 2012-06-26 MED ORDER — OXYCODONE-ACETAMINOPHEN 10-325 MG PO TABS: ORAL_TABLET | ORAL | Status: DC

## 2012-06-26 NOTE — Telephone Encounter (Signed)
Received a call from the pt yesterday requesting an Endocet refill. She is having ongoing feet & hand throbbing/cramping. Reviewed with Dr Myna Hidalgo. This is related to Oxaliplatin. Ok to refill. Will route to Eunice Blase, [place] as Dr Myna Hidalgo has left for the day. The pt wishes to pick up this afternoon. Will also work with Dr Myna Hidalgo on determining if there is another medication that can be used as the Endocet "just takes the edge off" but doesn't relieve it.

## 2012-07-17 ENCOUNTER — Telehealth: Payer: Self-pay | Admitting: Hematology & Oncology

## 2012-07-17 NOTE — Telephone Encounter (Signed)
Patient spoke with RN.  Per RN to sch appt for lab and French Ana on 07/18/12

## 2012-07-18 ENCOUNTER — Telehealth: Payer: Self-pay | Admitting: Hematology & Oncology

## 2012-07-18 ENCOUNTER — Ambulatory Visit (HOSPITAL_BASED_OUTPATIENT_CLINIC_OR_DEPARTMENT_OTHER): Payer: 59 | Admitting: Medical

## 2012-07-18 ENCOUNTER — Other Ambulatory Visit (HOSPITAL_BASED_OUTPATIENT_CLINIC_OR_DEPARTMENT_OTHER): Payer: 59 | Admitting: Lab

## 2012-07-18 VITALS — BP 128/70 | HR 65 | Temp 97.7°F | Resp 18 | Ht 69.0 in | Wt 159.0 lb

## 2012-07-18 DIAGNOSIS — C180 Malignant neoplasm of cecum: Secondary | ICD-10-CM

## 2012-07-18 DIAGNOSIS — D702 Other drug-induced agranulocytosis: Secondary | ICD-10-CM

## 2012-07-18 LAB — CBC WITH DIFFERENTIAL (CANCER CENTER ONLY)
BASO%: 0.2 % (ref 0.0–2.0)
EOS%: 2.7 % (ref 0.0–7.0)
HCT: 38.9 % (ref 34.8–46.6)
LYMPH#: 1.5 10*3/uL (ref 0.9–3.3)
MCHC: 32.9 g/dL (ref 32.0–36.0)
NEUT%: 63.7 % (ref 39.6–80.0)
Platelets: 137 10*3/uL — ABNORMAL LOW (ref 145–400)
RDW: 15.7 % (ref 11.1–15.7)
WBC: 5.2 10*3/uL (ref 3.9–10.0)

## 2012-07-18 LAB — LACTATE DEHYDROGENASE: LDH: 211 U/L (ref 94–250)

## 2012-07-18 LAB — CEA: CEA: 1.2 ng/mL (ref 0.0–5.0)

## 2012-07-18 MED ORDER — OXYCODONE-ACETAMINOPHEN 10-325 MG PO TABS: ORAL_TABLET | ORAL | Status: DC

## 2012-07-18 NOTE — Telephone Encounter (Signed)
Mailed 11-14 schedule

## 2012-07-18 NOTE — Progress Notes (Signed)
Diagnoses: #1 stage IIIB (T2, N2A, M0) adenocarcinoma of the cecum. #2 neuropathy, secondary to chemotherapy. #3 chronic inflammatory sites an infection, but chest CT, in right lung.  Past therapy: #1.  Status Morgan 12 cycles of FOLFOX chemotherapy  Interim history [name] presents today for an office followup visit.  Her daughter accompanies her.  Teresa Morgan, reports, that she did followup with the pulmonologist, Dr. Delford Field regarding be inflammatory or infectious process, that was found on chest CT scan.  According to Dr. Lynelle Doctor note, the plan for now is observation and a repeat CT chest in 6 months.  Teresa Morgan, reports, that she continues to work nine-hour days.  He really feels, that she's not had the proper Restoril out of her body to recuperate.  Since her chemotherapy.  Back in March.  She is asking for another note for her to work only 4-6 hour days.  She does report, that she did have 2 episodes on 2 different occasions of some vomiting, but is unable to relate it to anything.  She does report, that she is eating, well and has a good appetite.  She denies any type of diarrhea, constipation, any chest pain, shortness of breath, or cough.  She denies any fevers, chills, or night sweats.  She denies any obvious, or abnormal bleeding.  She denies any changes in her bowel movements.  She does report, that she continues to have some itching over her body, however, this has been, since she received chemotherapy.  She denies any headaches or visual changes.  She still continues to have neuropathy in her fingertips, as well as in her feet.  She remains on Neurontin 600 mg 4 times a day.  She did try Cymbalta, however, she did have some angioedema with that.  Teresa Morgan, and is scheduled for a CT scan on 07/25/2012.  She still continues to have a Port-A-Cath, which we flush every 6-8 weeks.  She with like to have her Port-A-Cath out, if this upcoming scan looks good.  Review of Systems: Neuropathy  of her fingertips, and feet bilaterally, and intermittent vomiting, x2, otherwise: Pt. Denies any changes in their vision, hearing, adenopathy, fevers, chills, nausea, diarrhea, constipation, chest pain, shortness of breath, passing blood, passing out, blacking out,  any changes in skin, joints, neurologic or psychiatric except as noted.  Physical Exam: This is a pleasant, 56 year old, well-developed, well-nourished Filipino, female, in no obvious distress Vitals: Temperature 97.7 degrees, pulse 65, respirations 18, blood pressure 128/70, weight 159 pounds HEENT reveals a normocephalic, atraumatic skull, no scleral icterus, no oral lesions  Neck is supple without any cervical or supraclavicular adenopathy.  Lungs are clear to auscultation bilaterally. There are no wheezes, rales or rhonci Cardiac is regular rate and rhythm with a normal S1 and S2. There are no murmurs, rubs, or bruits.  Abdomen is soft with good bowel sounds, there is no palpable mass. There is no palpable hepatosplenomegaly. There is no palpable fluid wave.  Musculoskeletal no tenderness of the spine, ribs, or hips.  Extremities there are no clubbing, cyanosis, or edema.  Skin no petechia, purpura or ecchymosis Neurologic is nonfocal.  Laboratory Data: White count 5.2, hemoglobin 12.8, hematocrit 30.9, platelets 137,000 Last CEA was back in may, at 1.7  Current Outpatient Prescriptions on File Prior to Visit  Medication Sig Dispense Refill  . ALPRAZolam (XANAX) 1 MG tablet Take 1 tablet (1 mg total) by mouth every 6 (six) hours as needed.  90 tablet  3  .  B Complex-C (B-COMPLEX WITH VITAMIN C) tablet Take 1 tablet by mouth daily.        . Calcium-Vitamin D 600-125 MG-UNIT TABS Take by mouth daily.        Marland Kitchen gabapentin (NEURONTIN) 600 MG tablet Take 1 tablet (600 mg total) by mouth 4 (four) times daily.  120 tablet  3  . lansoprazole (PREVACID) 30 MG capsule Take 1 capsule (30 mg total) by mouth daily.  30 capsule  3  .  lidocaine-prilocaine (EMLA) cream Apply 1 application topically as needed.        Marland Kitchen LORazepam (ATIVAN) 1 MG tablet Take 1 tablet (1 mg total) by mouth every 8 (eight) hours.  60 tablet  0  . Magnesium Oxide (PHILLIPS) 500 MG (LAX) TABS Take 500 mg by mouth daily.        . Multiple Vitamin (MULTIVITAMIN) tablet Take 1 tablet by mouth daily.        . TURMERIC PO Take by mouth daily.        . vitamin E 200 UNIT capsule Take 200 Units by mouth daily.        Marland Kitchen DISCONTD: oxyCODONE-acetaminophen (PERCOCET) 10-325 MG per tablet 1 - 2 tabs every 4 hours as needed for pain  120 tablet  0   Assessment/Plan: This is a pleasant, 56 year old, Filipino woman, with the following issues:   #1.  History of stage IIIB adenocarcinoma of the cecum.  She had 4 positive lymph nodes.  She underwent adjuvant chemotherapy with 12 cycles of FOLFOX.  She does have neuropathy, secondary to chemotherapy.  She is due for CT scan on 07/25/2012.  We will inform her of those results.  #2 neuropathy, secondary to chemotherapy.  She will continue on Neurontin.  #3 Port-A-Cath-hopefully, we can get this out, soon.  Until then she will have her port flushed every 6-8 weeks.  #4 pulmonology-she will continue to followup with Dr. Delford Field.  #5 followup-we'll follow back up with [name] in 2-3 months, but before then should there be questions or concerns.

## 2012-07-25 ENCOUNTER — Ambulatory Visit (HOSPITAL_BASED_OUTPATIENT_CLINIC_OR_DEPARTMENT_OTHER)
Admission: RE | Admit: 2012-07-25 | Discharge: 2012-07-25 | Disposition: A | Discharge status: Home / Self Care | Payer: 59 | Source: Ambulatory Visit | Attending: Hematology & Oncology | Admitting: Hematology & Oncology

## 2012-07-25 DIAGNOSIS — C180 Malignant neoplasm of cecum: Secondary | ICD-10-CM | POA: Insufficient documentation

## 2012-07-25 MED ORDER — IOHEXOL 300 MG/ML  SOLN
100.0000 mL | Freq: Once | INTRAMUSCULAR | Status: AC | PRN
  Administered 2012-07-25: 100 mL via INTRAVENOUS

## 2012-08-01 ENCOUNTER — Other Ambulatory Visit (HOSPITAL_BASED_OUTPATIENT_CLINIC_OR_DEPARTMENT_OTHER): Payer: 59

## 2012-08-01 ENCOUNTER — Other Ambulatory Visit: Payer: 59 | Admitting: Lab

## 2012-08-01 ENCOUNTER — Ambulatory Visit: Payer: 59 | Admitting: Hematology & Oncology

## 2012-08-16 ENCOUNTER — Telehealth: Payer: Self-pay | Admitting: *Deleted

## 2012-08-16 NOTE — Telephone Encounter (Signed)
Patients Daughter Cristie Hem called telling us that patient has been throwing up for 2 weeks maybe even more.  Describes this as violent vomiting.  Has abdominal pain rated as a 10.  Patient has had a fever since Friday that is off and on.  Fever has gotten as high as 101.F  Patient does not have a primary care physician.  PAtient has been eating some but not great.  Complains of being weak and tired with chest congestion.  Patient has missed one maybe 2 days of work according to daughter.  Spoke to Dr. Lupita Leash. He states with these issues she could be obstructed and needs to go to ED to be evaluated.  Left this message on Desiree postons personal cell phone answering machine.

## 2012-08-27 ENCOUNTER — Other Ambulatory Visit: Payer: Self-pay | Admitting: *Deleted

## 2012-08-27 DIAGNOSIS — R112 Nausea with vomiting, unspecified: Secondary | ICD-10-CM

## 2012-08-27 DIAGNOSIS — F419 Anxiety disorder, unspecified: Secondary | ICD-10-CM

## 2012-08-27 DIAGNOSIS — C180 Malignant neoplasm of cecum: Secondary | ICD-10-CM

## 2012-08-27 MED ORDER — OXYCODONE-ACETAMINOPHEN 10-325 MG PO TABS: ORAL_TABLET | ORAL | Status: DC

## 2012-08-27 MED ORDER — ALPRAZOLAM 1 MG PO TABS: 1.0000 mg | ORAL_TABLET | Freq: Four times a day (QID) | ORAL | Status: DC | PRN

## 2012-08-27 NOTE — Telephone Encounter (Signed)
Pt called requesting refills for Endocet and Xanax for her ongoing issues. Will route to Dr Myna Hidalgo for approval & signature then place at the front desk for pick up.

## 2012-09-03 ENCOUNTER — Ambulatory Visit (INDEPENDENT_AMBULATORY_CARE_PROVIDER_SITE_OTHER): Payer: 59 | Admitting: General Surgery

## 2012-09-03 ENCOUNTER — Encounter (INDEPENDENT_AMBULATORY_CARE_PROVIDER_SITE_OTHER): Payer: Self-pay | Admitting: General Surgery

## 2012-09-03 VITALS — BP 128/82 | HR 81 | Temp 97.6°F | Ht 68.0 in | Wt 160.0 lb

## 2012-09-03 DIAGNOSIS — C180 Malignant neoplasm of cecum: Secondary | ICD-10-CM

## 2012-09-03 NOTE — Assessment & Plan Note (Signed)
Patient with recent colonoscopy last week that was negative for cancer recurrence. Recent CT scan in September negative for cancer recurrence.  She is to follow up with Dr. Myna Hidalgo in 2 weeks for evaluation.  I would not recommend taking her Port-A-Cath out yet she is not quite at 18 months status post colectomy.  She is at higher risk for recurrence given the fact that she is was T3N2 pathologic diagnosis.  She has been having some nausea and vomiting. I don't think this represents cancer recurrence. I advised her that she could undergo EGD or right upper quadrant ultrasound to further evaluate this. She thinks this is because taking medication on an empty stomach. She will call if she would like to be seen or evaluated earlier. I will follow her up in 4-5 months between Dr. Gustavo Lah appointments.

## 2012-09-03 NOTE — Patient Instructions (Signed)
Follow up with me in 4-5 months (between Dr. Gustavo Lah visits)  Call if nausea/vomiting symptoms get worse.

## 2012-09-03 NOTE — Progress Notes (Signed)
HISTORY: The patient is a 56 year old female around 14 months status post laparoscopic right colectomy for cecal cancer. She finished her chemotherapy in March of this year. She has had some occasional nausea and vomiting that occurs around once a week.  She denies any bloody bowel movements. She denies bloating. She underwent CT scanning in September that was negative for cancer recurrence. She was evaluated by pulmonology for her infiltrates seen on chest CT back in June. Dr. Delford Field felt that these were representative of atypical pneumonia.   PERTINENT REVIEW OF SYSTEMS: Otherwise negative.     EXAM: Head: Normocephalic and atraumatic.  Eyes:  Conjunctivae are normal. Pupils are equal, round, and reactive to light. No scleral icterus.  Neck:  Normal range of motion. Neck supple. No tracheal deviation present. No thyromegaly present.  Resp: No respiratory distress, normal effort. Abd:  Abdomen is soft, non distended and non tender. No masses are palpable.  There is no rebound and no guarding.  Neurological: Alert and oriented to person, place, and time. Antalgic gait.  MSK:  Tender left hip.   Skin: Skin is warm and dry. No rash noted. No diaphoretic. No erythema. No pallor.  Psychiatric: Normal mood and affect. Normal behavior. Judgment and thought content normal.      ASSESSMENT AND PLAN:   Cecal cancer, pT3N2a Patient with recent colonoscopy last week that was negative for cancer recurrence. Recent CT scan in September negative for cancer recurrence.  She is to follow up with Dr. Myna Hidalgo in 2 weeks for evaluation.  I would not recommend taking her Port-A-Cath out yet she is not quite at 18 months status post colectomy.  She is at higher risk for recurrence given the fact that she is was T3N2 pathologic diagnosis.  She has been having some nausea and vomiting. I don't think this represents cancer recurrence. I advised her that she could undergo EGD or right upper quadrant  ultrasound to further evaluate this. She thinks this is because taking medication on an empty stomach. She will call if she would like to be seen or evaluated earlier. I will follow her up in 4-5 months between Dr. Gustavo Lah appointments.      Maudry Diego, MD Surgical Oncology, General & Endocrine Surgery [facility]  Emeterio Reeve, MD Almond Lint, MD

## 2012-09-04 ENCOUNTER — Ambulatory Visit: Payer: 59 | Admitting: Family

## 2012-09-17 ENCOUNTER — Other Ambulatory Visit (HOSPITAL_BASED_OUTPATIENT_CLINIC_OR_DEPARTMENT_OTHER): Payer: 59 | Admitting: Lab

## 2012-09-17 ENCOUNTER — Ambulatory Visit (HOSPITAL_BASED_OUTPATIENT_CLINIC_OR_DEPARTMENT_OTHER): Payer: 59 | Admitting: Hematology & Oncology

## 2012-09-17 ENCOUNTER — Ambulatory Visit: Payer: 59

## 2012-09-17 VITALS — BP 129/75 | HR 62 | Temp 97.7°F | Resp 16 | Ht 68.0 in | Wt 159.0 lb

## 2012-09-17 DIAGNOSIS — C180 Malignant neoplasm of cecum: Secondary | ICD-10-CM

## 2012-09-17 DIAGNOSIS — G620 Drug-induced polyneuropathy: Secondary | ICD-10-CM

## 2012-09-17 DIAGNOSIS — G5793 Unspecified mononeuropathy of bilateral lower limbs: Secondary | ICD-10-CM

## 2012-09-17 LAB — COMPREHENSIVE METABOLIC PANEL
AST: 16 U/L (ref 0–37)
Alkaline Phosphatase: 79 U/L (ref 39–117)
BUN: 12 mg/dL (ref 6–23)
Calcium: 9.4 mg/dL (ref 8.4–10.5)
Chloride: 106 mEq/L (ref 96–112)
Creatinine, Ser: 0.94 mg/dL (ref 0.50–1.10)
Total Bilirubin: 0.4 mg/dL (ref 0.3–1.2)

## 2012-09-17 LAB — CBC WITH DIFFERENTIAL (CANCER CENTER ONLY)
BASO#: 0 10*3/uL (ref 0.0–0.2)
Eosinophils Absolute: 0.1 10*3/uL (ref 0.0–0.5)
HCT: 37.4 % (ref 34.8–46.6)
HGB: 12.2 g/dL (ref 11.6–15.9)
LYMPH%: 32.1 % (ref 14.0–48.0)
MCH: 22.7 pg — ABNORMAL LOW (ref 26.0–34.0)
MCV: 70 fL — ABNORMAL LOW (ref 81–101)
MONO%: 5.3 % (ref 0.0–13.0)
NEUT#: 2.7 10*3/uL (ref 1.5–6.5)
RBC: 5.38 10*6/uL — ABNORMAL HIGH (ref 3.70–5.32)

## 2012-09-17 MED ORDER — SODIUM CHLORIDE 0.9 % IJ SOLN
10.0000 mL | INTRAMUSCULAR | Status: DC | PRN
  Administered 2012-09-17: 10 mL via INTRAVENOUS
  Filled 2012-09-17: qty 10

## 2012-09-17 MED ORDER — HEPARIN SOD (PORK) LOCK FLUSH 100 UNIT/ML IV SOLN
500.0000 [IU] | Freq: Once | INTRAVENOUS | Status: AC
  Administered 2012-09-17: 500 [IU] via INTRAVENOUS
  Filled 2012-09-17: qty 5

## 2012-09-17 MED ORDER — OXYCODONE-ACETAMINOPHEN 10-325 MG PO TABS: ORAL_TABLET | ORAL | Status: DC

## 2012-09-17 NOTE — Addendum Note (Signed)
Addended by: Arlan Organ R on: 09/17/2012 01:01 PM   Modules accepted: Orders, Medications

## 2012-09-17 NOTE — Progress Notes (Signed)
This office note has been dictated.

## 2012-09-18 NOTE — Progress Notes (Signed)
CC:   Emeterio Reeve, MD Teresa Lint, MD  DIAGNOSES: 1. Stage IIIB (T2 N2A M0) adenocarcinoma of the cecum. 2. Neuropathy secondary to chemotherapy.  CURRENT THERAPY:  Observation.  INTERIM HISTORY:  Teresa Morgan comes in for followup.  She is doing okay. She is working at the [employer].  Her feet still bother her from the neuropathy.  She is on Neurontin 600 mg 3 or 4 times a day.  She says that she does not take this 3 or 4 times a day, however.  She had her last CT scan done back in September.  Thankfully, the CT scan did not show any evidence of recurrent disease.  She does see Dr. Delford Field of pulmonary medicine.  She does have some possible bronchiectasis.  He is helping [place] out with this.  She has had no abdominal pain.  She is on Percocet which does help with the neuropathy.  Her last CEA that we drew on her back in September was 1.2.  PHYSICAL EXAMINATION:  General:  This is a well-developed, well- nourished female in no obvious distress.  Vital signs:  Show temperature of 97.7, pulse 63, respiratory rate 18, blood pressure 129/75.  Weight is 159.  Head and neck:  Shows a normocephalic, atraumatic skull.  There are no ocular or oral lesions.  There are no palpable cervical or supraclavicular lymph nodes.  Lungs:  Clear bilaterally.  Cardiac: Regular rate and rhythm with a normal S1, S2.  There are no murmurs, rubs or bruits.  Abdomen:  Soft with good bowel sounds.  She has well- healed laparoscopy scars.  There is some tenderness in the right lower quadrant.  There is no fluid wave.  There is no palpable hepatosplenomegaly.  Back:  No tenderness over the spine, ribs or hips. Extremities:  Does show tenderness to palpation in the lower legs and feet.  She has hyperesthesia with palpation of her lower legs.  There is no swelling in her legs.  Skin:  Shows no rashes.  LABORATORY STUDIES:  White cell count is 4.6, hemoglobin 12.2, hematocrit 37.4, platelet count  166.  IMPRESSION:  Teresa Morgan is a 56 year old female with stage IIIB adenocarcinoma of the cecum.  She had 4 positive lymph nodes.  She completed 12 cycles of FOLFOX in March of this year.  We will go ahead and plan for a CT scan in December.  Once we get to March of 2014 then we will go to every 4 months.  We will go ahead and refill the Percocet for her.  This, again, helps her quality of life and allows her to function and work which, for her, is very important.    ______________________________ Josph Macho, M.D. PRE/MEDQ  D:  09/17/2012  T:  09/18/2012  Job:  [id number]

## 2012-09-27 ENCOUNTER — Encounter: Payer: Self-pay | Admitting: *Deleted

## 2012-09-27 ENCOUNTER — Encounter: Payer: Self-pay | Admitting: Critical Care Medicine

## 2012-09-27 ENCOUNTER — Ambulatory Visit (INDEPENDENT_AMBULATORY_CARE_PROVIDER_SITE_OTHER): Payer: 59 | Admitting: Critical Care Medicine

## 2012-09-27 VITALS — BP 124/80 | HR 75 | Temp 98.0°F | Ht 69.0 in | Wt 159.0 lb

## 2012-09-27 DIAGNOSIS — J438 Other emphysema: Secondary | ICD-10-CM

## 2012-09-27 DIAGNOSIS — J439 Emphysema, unspecified: Secondary | ICD-10-CM

## 2012-09-27 MED ORDER — FLUTICASONE FUROATE-VILANTEROL 100-25 MCG/INH IN AEPB: 1.0000 | INHALATION_SPRAY | Freq: Every day | RESPIRATORY_TRACT | Status: DC

## 2012-09-27 NOTE — Patient Instructions (Signed)
Start Breo one puff daily, use samples and use Script to get one month free trial Return 6 weeks for recheck

## 2012-09-27 NOTE — Progress Notes (Signed)
Subjective:    Patient ID: Teresa Morgan, female    DOB: 09/14/1956, 56 y.o.   MRN: [id number]  HPI  Dx colon (cecal)  T3N2a  CA 05/2011.  R side near appx.  Pt had surgery.  Post op Chemorx.  +LN involved. Not on chemorx , last Rx march 2013.   Pt notes dyspnea since smoker and quit 6 yrs ago  09/27/2012 No real change with breathing. No cough or mucus.  No real wheeze.  Pt did need ABX for PNA or bronchitis while smoking . Pt quit 6 yrs ago.  Did not get pfts done yet. No real chest pain. No f/c/s. Pt denies any significant sore throat, nasal congestion or excess secretions, fever, chills, sweats, unintended weight loss, pleurtic or exertional chest pain, orthopnea PND, or leg swelling Pt denies any increase in rescue therapy over baseline, denies waking up needing it or having any early am or nocturnal exacerbations of coughing/wheezing/or dyspnea. Pt also denies any obvious fluctuation in symptoms with  weather or environmental change or other alleviating or aggravating factors  CAT Score 09/27/2012  Total CAT Score 11       Past Medical History  Diagnosis Date  . Anemia   . Anxiety   . Arthritis   . Depression   . GERD (gastroesophageal reflux disease)   . Ulcer   . Colon cancer   . COPD with emphysema 06/18/2012     Family History  Problem Relation Age of Onset  . Diabetes Mother   . Colon cancer Cousin      History   Social History  . Marital Status: Married    Spouse Name: N/A    Number of Children: 3  . 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 Smoking status: Former Smoker -- 1.0 packs/day for 25 years    Types: Cigarettes    Quit date: 06/08/2005  . Smokeless tobacco: Never Used  . Alcohol Use: 0.6 oz/week    1 Glasses of wine per week     Comment: social  . Drug Use: No  . Sexually Active: Not Currently    Birth Control/ Protection: Surgical   Other Topics Concern  . Not on file    Social History Narrative  . No narrative on file     Allergies  Allergen Reactions  . Aspirin     Upset stomach  . Cymbalta (Duloxetine Hcl) Swelling    Swollen lips      Outpatient Prescriptions Prior to Visit  Medication Sig Dispense Refill  . ALPRAZolam (XANAX) 1 MG tablet Take 1 tablet (1 mg total) by mouth every 6 (six) hours as needed.  90 tablet  3  . B Complex-C (B-COMPLEX WITH VITAMIN C) tablet Take 1 tablet by mouth daily.        . Calcium-Vitamin D 600-125 MG-UNIT TABS Take by mouth daily.        Marland Kitchen gabapentin (NEURONTIN) 600 MG tablet Take 1 tablet (600 mg total) by mouth 4 (four) times daily.  120 tablet  3  . lansoprazole (PREVACID) 30 MG capsule Take 1 capsule (30 mg total) by mouth daily.  30 capsule  3  . lidocaine-prilocaine (EMLA) cream Apply 1 application topically as needed.        . Magnesium Oxide (PHILLIPS) 500 MG (LAX) TABS Take 500 mg by mouth daily.        . Multiple Vitamin (  MULTIVITAMIN) tablet Take 1 tablet by mouth daily.        Marland Kitchen oxyCODONE-acetaminophen (PERCOCET) 10-325 MG per tablet 1 - 2 tabs every 4 hours as needed for pain  120 tablet  0  . TURMERIC PO Take by mouth daily.       . vitamin E 200 UNIT capsule Take 200 Units by mouth daily.        Marland Kitchen FLUoxetine (PROZAC) 20 MG capsule 10 mg. TAKES 10 ME DAILY. 10-18-  On hold      . oxyCODONE-acetaminophen (PERCOCET) 10-325 MG per tablet 1 - 2 tabs every 4 hours as needed for pain  120 tablet  0     Review of Systems  Constitutional: Negative for chills, diaphoresis, activity change, appetite change, fatigue and unexpected weight change.  HENT: Negative for hearing loss, nosebleeds, congestion, facial swelling, sneezing, mouth sores, trouble swallowing, neck stiffness, dental problem, voice change, postnasal drip, sinus pressure, tinnitus and ear discharge.   Eyes: Negative for photophobia, discharge, itching and visual disturbance.  Respiratory: Negative for apnea, cough, choking, chest tightness  and stridor.   Cardiovascular: Negative for palpitations.  Gastrointestinal: Positive for constipation. Negative for nausea, blood in stool and abdominal distention.  Genitourinary: Negative for dysuria, urgency, frequency, hematuria, flank pain, decreased urine volume and difficulty urinating.  Musculoskeletal: Positive for back pain. Negative for myalgias, joint swelling, arthralgias and gait problem.  Skin: Negative for color change and pallor.  Neurological: Negative for dizziness, tremors, seizures, syncope, speech difficulty, weakness, light-headedness and numbness.  Hematological: Negative for adenopathy. Bruises/bleeds easily.  Psychiatric/Behavioral: Negative for confusion, sleep disturbance and agitation. The patient is nervous/anxious.        Objective:   Physical Exam  Filed Vitals:   09/27/12 1605  BP: 124/80  Pulse: 75  Temp: 98 F (36.7 C)  Height: 5\' 9"  (1.753 m)  Weight: 159 lb (72.122 kg)  SpO2: 99%    Gen: Pleasant, well-nourished, in no distress,  normal affect  ENT: No lesions,  mouth clear,  oropharynx clear, no postnasal drip  Neck: No JVD, no TMG, no carotid bruits  Lungs: No use of accessory muscles, no dullness to percussion,distant BS  Cardiovascular: RRR, heart sounds normal, no murmur or gallops, no peripheral edema  Abdomen: soft and NT, no HSM,  BS normal  Musculoskeletal: No deformities, no cyanosis or clubbing  Neuro: alert, non focal  Skin: Warm, no lesions or rashes  Cleda Daub 09/27/2012:  FeV1 74% FeF 25 75 45%         Assessment & Plan:   COPD with emphysema, Gold B Gold D. COPD with associated emphysema and previous frequent exacerbations now improved off cigarette smoking Plan Trial of Breo  (Vilanterol /fluticasone ) one inhalation daily Samples given    Updated Medication List Outpatient Encounter Prescriptions as of 09/27/2012  Medication Sig Dispense Refill  . ALPRAZolam (XANAX) 1 MG tablet Take 1 tablet (1 mg  total) by mouth every 6 (six) hours as needed.  90 tablet  3  . B Complex-C (B-COMPLEX WITH VITAMIN C) tablet Take 1 tablet by mouth daily.        . Calcium-Vitamin D 600-125 MG-UNIT TABS Take by mouth daily.        Marland Kitchen gabapentin (NEURONTIN) 600 MG tablet Take 1 tablet (600 mg total) by mouth 4 (four) times daily.  120 tablet  3  . lansoprazole (PREVACID) 30 MG capsule Take 1 capsule (30 mg total) by mouth daily.  30 capsule  3  .  lidocaine-prilocaine (EMLA) cream Apply 1 application topically as needed.        . Magnesium Oxide (PHILLIPS) 500 MG (LAX) TABS Take 500 mg by mouth daily.        . Multiple Vitamin (MULTIVITAMIN) tablet Take 1 tablet by mouth daily.        Marland Kitchen oxyCODONE-acetaminophen (PERCOCET) 10-325 MG per tablet 1 - 2 tabs every 4 hours as needed for pain  120 tablet  0  . TURMERIC PO Take by mouth daily.       . vitamin E 200 UNIT capsule Take 200 Units by mouth daily.        . [DISCONTINUED] FLUoxetine (PROZAC) 20 MG capsule 10 mg. TAKES 10 ME DAILY. 10-18-  On hold      . Fluticasone Furoate-Vilanterol (BREO ELLIPTA) 100-25 MCG/INH AEPB Inhale 1 puff into the lungs daily.  30 each  6  . Fluticasone Furoate-Vilanterol (BREO ELLIPTA) 100-25 MCG/INH AEPB Inhale 1 puff into the lungs daily.  28 each  0  . [DISCONTINUED] oxyCODONE-acetaminophen (PERCOCET) 10-325 MG per tablet 1 - 2 tabs every 4 hours as needed for pain  120 tablet  0

## 2012-09-27 NOTE — Assessment & Plan Note (Signed)
Teresa Morgan. COPD with associated emphysema and previous frequent exacerbations now improved off cigarette smoking Plan Trial of Breo  (Vilanterol /fluticasone ) one inhalation daily Samples given

## 2012-10-26 ENCOUNTER — Other Ambulatory Visit: Payer: Self-pay | Admitting: *Deleted

## 2012-10-29 ENCOUNTER — Ambulatory Visit: Payer: 59

## 2012-10-29 ENCOUNTER — Ambulatory Visit (HOSPITAL_BASED_OUTPATIENT_CLINIC_OR_DEPARTMENT_OTHER)
Admission: RE | Admit: 2012-10-29 | Discharge: 2012-10-29 | Disposition: A | Discharge status: Home / Self Care | Payer: 59 | Source: Ambulatory Visit | Attending: Hematology & Oncology | Admitting: Hematology & Oncology

## 2012-10-29 ENCOUNTER — Other Ambulatory Visit: Payer: Self-pay | Admitting: *Deleted

## 2012-10-29 ENCOUNTER — Other Ambulatory Visit (HOSPITAL_BASED_OUTPATIENT_CLINIC_OR_DEPARTMENT_OTHER): Payer: 59 | Admitting: Lab

## 2012-10-29 ENCOUNTER — Encounter (HOSPITAL_BASED_OUTPATIENT_CLINIC_OR_DEPARTMENT_OTHER): Payer: Self-pay

## 2012-10-29 ENCOUNTER — Telehealth: Payer: Self-pay | Admitting: Hematology & Oncology

## 2012-10-29 ENCOUNTER — Ambulatory Visit (HOSPITAL_BASED_OUTPATIENT_CLINIC_OR_DEPARTMENT_OTHER): Payer: 59 | Admitting: Hematology & Oncology

## 2012-10-29 VITALS — BP 127/72 | HR 65 | Temp 97.3°F | Resp 16 | Wt 161.0 lb

## 2012-10-29 DIAGNOSIS — R918 Other nonspecific abnormal finding of lung field: Secondary | ICD-10-CM | POA: Insufficient documentation

## 2012-10-29 DIAGNOSIS — C180 Malignant neoplasm of cecum: Secondary | ICD-10-CM

## 2012-10-29 DIAGNOSIS — D500 Iron deficiency anemia secondary to blood loss (chronic): Secondary | ICD-10-CM

## 2012-10-29 DIAGNOSIS — G620 Drug-induced polyneuropathy: Secondary | ICD-10-CM

## 2012-10-29 DIAGNOSIS — C189 Malignant neoplasm of colon, unspecified: Secondary | ICD-10-CM | POA: Insufficient documentation

## 2012-10-29 LAB — COMPREHENSIVE METABOLIC PANEL
ALT: 16 U/L (ref 0–35)
AST: 18 U/L (ref 0–37)
Calcium: 9.3 mg/dL (ref 8.4–10.5)
Chloride: 105 mEq/L (ref 96–112)
Creatinine, Ser: 0.84 mg/dL (ref 0.50–1.10)
Sodium: 138 mEq/L (ref 135–145)

## 2012-10-29 LAB — CBC WITH DIFFERENTIAL (CANCER CENTER ONLY)
BASO%: 0.2 % (ref 0.0–2.0)
EOS%: 2.4 % (ref 0.0–7.0)
HCT: 39.1 % (ref 34.8–46.6)
LYMPH#: 2 10*3/uL (ref 0.9–3.3)
MCHC: 32.7 g/dL (ref 32.0–36.0)
NEUT#: 3.5 10*3/uL (ref 1.5–6.5)
NEUT%: 58.8 % (ref 39.6–80.0)
Platelets: 138 10*3/uL — ABNORMAL LOW (ref 145–400)
RDW: 16.5 % — ABNORMAL HIGH (ref 11.1–15.7)

## 2012-10-29 LAB — CEA: CEA: 1.6 ng/mL (ref 0.0–5.0)

## 2012-10-29 MED ORDER — ALTEPLASE 2 MG IJ SOLR
2.0000 mg | Freq: Once | INTRAMUSCULAR | Status: DC | PRN
Start: 2012-10-29 — End: 2012-10-29
  Filled 2012-10-29: qty 2

## 2012-10-29 MED ORDER — SODIUM CHLORIDE 0.9 % IV SOLN: Freq: Once | INTRAVENOUS | Status: DC

## 2012-10-29 MED ORDER — SODIUM CHLORIDE 0.9 % IJ SOLN
10.0000 mL | INTRAMUSCULAR | Status: DC | PRN
  Administered 2012-10-29: 10 mL via INTRAVENOUS
  Filled 2012-10-29: qty 10

## 2012-10-29 MED ORDER — HEPARIN SOD (PORK) LOCK FLUSH 100 UNIT/ML IV SOLN
250.0000 [IU] | Freq: Once | INTRAVENOUS | Status: DC | PRN
  Filled 2012-10-29: qty 5

## 2012-10-29 MED ORDER — HEPARIN SOD (PORK) LOCK FLUSH 100 UNIT/ML IV SOLN
500.0000 [IU] | Freq: Once | INTRAVENOUS | Status: AC
  Administered 2012-10-29: 500 [IU] via INTRAVENOUS
  Filled 2012-10-29: qty 5

## 2012-10-29 MED ORDER — OXYCODONE-ACETAMINOPHEN 10-325 MG PO TABS: ORAL_TABLET | ORAL | Status: DC

## 2012-10-29 MED ORDER — SODIUM CHLORIDE 0.9 % IJ SOLN
3.0000 mL | Freq: Once | INTRAMUSCULAR | Status: DC | PRN
  Filled 2012-10-29: qty 10

## 2012-10-29 MED ORDER — IOHEXOL 300 MG/ML  SOLN
80.0000 mL | Freq: Once | INTRAMUSCULAR | Status: AC | PRN
  Administered 2012-10-29: 80 mL via INTRAVENOUS

## 2012-10-29 NOTE — Telephone Encounter (Signed)
Pt requested a refill for Endocet while here for her flush appt. Will route to Dr Myna Hidalgo for approval & signature then place at the front desk for pick up.

## 2012-10-29 NOTE — Progress Notes (Signed)
CC:   Emeterio Reeve, MD Almond Lint, MD Charlcie Cradle. Delford Field, MD, FCCP  DIAGNOSIS:  Stage IIIB (T2 N2 M0) adenocarcinoma of the cecum. Chemotherapy-induced neuropathy.  CURRENT THERAPY:  Observation.  INTERIM HISTORY:  Ms. Upshur comes in for followup.  We actually had her CAT scan done today.  __________ since she was here, we will go ahead and see her.  She did have a CAT scan done.  This did not show any obvious recurrent disease.  She still has changes in her lung which looks more infection related.  She sees Charlcie Cradle. Delford Field, MD, FCCP. She may have some degree of bronchiectasis.  There is no evidence of recurrent disease within the abdomen or pelvis. Her liver looked fine.  Her last CEA was 1.6, was holding steady.  The patient still working at the [employer].  The neuropathy mostly bothers her at nighttime.  She has had no bleeding or bruising.  There has been no change in bowel or bladder habits.  She has had no cough.  PHYSICAL EXAMINATION:  This is a well-developed, well-nourished Asian female in no obvious distress.  Vital signs:  Temperature of 97.3, pulse 65, respiratory rate 18, blood pressure 127/72.  Weight is 161.  Head and neck:  Normocephalic, atraumatic skull.  There are no ocular or oral lesions.  There are no palpable cervical or supraclavicular lymph nodes. Lungs:  Clear bilaterally.  Cardiac:  Regular rate and rhythm with a normal S1, S2.  There are no murmurs, rubs or bruits.  Abdomen:  Soft with good bowel sounds.  There is no palpable abdominal mass.  There is no fluid wave.  There is no palpable hepatosplenomegaly.  Well-healed laparotomy scar.  Extremities:  Shows no clubbing, cyanosis or edema. Neurological:  Shows no focal neurological deficits.  LABORATORY STUDIES:  White cell count is 5.9, hemoglobin 12.8, hematocrit 39.1 and platelet count is 138.  IMPRESSION:  Ms. Mousseau is a nice 56 year old Filipino woman with history of stage IIIB  adenocarcinoma of the cecum.  She underwent resection.  She had 4 positive lymph nodes.  She completed FOLFOX in March of this year.  She had 12 cycles.  Again, I do not see any evidence that she has recurrent disease.  I suspect that these lung changes are related to this bronchiectasis or ongoing pulmonary infection.  We will go ahead and plan to repeat her scans in 3 months.  I will see back in March.    ______________________________ Josph Macho, M.D. PRE/MEDQ  D:  10/29/2012  T:  10/29/2012  Job:  [id number]

## 2012-10-29 NOTE — Telephone Encounter (Signed)
Per MD and RN to cx patient's 11/05/12 apt and rech for today 10/29/12

## 2012-10-29 NOTE — Progress Notes (Signed)
This office note has been dictated.

## 2012-11-01 ENCOUNTER — Encounter: Payer: Self-pay | Admitting: Critical Care Medicine

## 2012-11-01 ENCOUNTER — Ambulatory Visit (INDEPENDENT_AMBULATORY_CARE_PROVIDER_SITE_OTHER): Payer: 59 | Admitting: Critical Care Medicine

## 2012-11-01 VITALS — BP 116/72 | HR 64 | Temp 97.7°F | Ht 69.0 in | Wt 160.0 lb

## 2012-11-01 DIAGNOSIS — J438 Other emphysema: Secondary | ICD-10-CM

## 2012-11-01 DIAGNOSIS — R918 Other nonspecific abnormal finding of lung field: Secondary | ICD-10-CM

## 2012-11-01 DIAGNOSIS — J439 Emphysema, unspecified: Secondary | ICD-10-CM

## 2012-11-01 NOTE — Patient Instructions (Addendum)
Stay on Breo inhaler one puff daily No other medication changes  Return 6 months

## 2012-11-01 NOTE — Assessment & Plan Note (Signed)
Gold stage B. COPD with primary emphysema stable at this time on Breo Ellipta inhaler Plan  Maintain inhaled medications as prescribed No nodular changes on CT scan are likely prior granulomatous disease and are not representing active infection at this time and will be observed

## 2012-11-01 NOTE — Assessment & Plan Note (Signed)
Bilateral right middle lobe and left lingular tree in bud infiltrate compatible with Mycobacterium avium infection or granulomatous disease in the past. There is no evidence of active infection at this time. There is no indication for bronchoscopy or treatment.  Plan Observation for now

## 2012-11-01 NOTE — Progress Notes (Signed)
Subjective:    Patient ID: Teresa Morgan, female    DOB: 04/27/1956, 56 y.o.   MRN: [id number]  HPI   Dx colon (cecal)  T3N2a  CA 05/2011.  R side near appx.  Pt had surgery.  Post op Chemorx.  +LN involved. Not on chemorx , last Rx march 2013.   Pt notes dyspnea since smoker and quit 6 yrs ago  09/27/2012 No real change with breathing. No cough or mucus.  No real wheeze.  Pt did need ABX for PNA or bronchitis while smoking . Pt quit 6 yrs ago.  Did not get pfts done yet. No real chest pain. No f/c/s. Pt denies any significant sore throat, nasal congestion or excess secretions, fever, chills, sweats, unintended weight loss, pleurtic or exertional chest pain, orthopnea PND, or leg swelling Pt denies any increase in rescue therapy over baseline, denies waking up needing it or having any early am or nocturnal exacerbations of coughing/wheezing/or dyspnea. Pt also denies any obvious fluctuation in symptoms with  weather or environmental change or other alleviating or aggravating factors  11/01/2012 Pt feels better.  N oreal cough .  No real wheeze.   Pt denies any significant sore throat, nasal congestion or excess secretions, fever, chills, sweats, unintended weight loss, pleurtic or exertional chest pain, orthopnea PND, or leg swelling Pt denies any increase in rescue therapy over baseline, denies waking up needing it or having any early am or nocturnal exacerbations of coughing/wheezing/or dyspnea. Pt also denies any obvious fluctuation in symptoms with  weather or environmental change or other alleviating or aggravating factors    CAT Score 09/27/2012  Total CAT Score 11       Past Medical History  Diagnosis Date  . Anemia   . Anxiety   . Arthritis   . Depression   . GERD (gastroesophageal reflux disease)   . Ulcer   . Colon cancer   . COPD with emphysema 06/18/2012     Family History  Problem Relation Age of Onset  . Diabetes Mother   . Colon cancer Cousin      History    Social History  . Marital Status: Married    Spouse Name: N/A    Number of Children: 3  . 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 Smoking status: Former Smoker -- 1.0 packs/day for 25 years    Types: Cigarettes    Quit date: 06/08/2005  . Smokeless tobacco: Never Used  . Alcohol Use: 0.6 oz/week    1 Glasses of wine per week     Comment: social  . Drug Use: No  . Sexually Active: Not Currently    Birth Control/ Protection: Surgical   Other Topics Concern  . Not on file   Social History Narrative  . No narrative on file     Allergies  Allergen Reactions  . Aspirin     Upset stomach  . Cymbalta (Duloxetine Hcl) Swelling    Swollen lips      Outpatient Prescriptions Prior to Visit  Medication Sig Dispense Refill  . ALPRAZolam (XANAX) 1 MG tablet Take 1 tablet (1 mg total) by mouth every 6 (six) hours as needed.  90 tablet  3  . B Complex-C (B-COMPLEX WITH VITAMIN C) tablet Take 1 tablet by mouth daily.        . Calcium-Vitamin D 600-125 MG-UNIT TABS Take by mouth daily.        Marland Kitchen  Fluticasone Furoate-Vilanterol (BREO ELLIPTA) 100-25 MCG/INH AEPB Inhale 1 puff into the lungs daily.  30 each  6  . gabapentin (NEURONTIN) 600 MG tablet Take 1 tablet (600 mg total) by mouth 4 (four) times daily.  120 tablet  3  . lidocaine-prilocaine (EMLA) cream Apply 1 application topically as needed.        . Multiple Vitamin (MULTIVITAMIN) tablet Take 1 tablet by mouth daily.        Marland Kitchen oxyCODONE-acetaminophen (PERCOCET) 10-325 MG per tablet 1 - 2 tabs every 4 hours as needed for pain  120 tablet  0  . TURMERIC PO Take by mouth daily.       . vitamin E 200 UNIT capsule Take 200 Units by mouth daily.        . [DISCONTINUED] Fluticasone Furoate-Vilanterol (BREO ELLIPTA) 100-25 MCG/INH AEPB Inhale 1 puff into the lungs daily.  28 each  0  . [DISCONTINUED] lansoprazole (PREVACID) 30 MG capsule Take 1 capsule (30 mg total) by mouth  daily.  30 capsule  3  . [DISCONTINUED] FLUoxetine (PROZAC) 20 MG capsule       . [DISCONTINUED] Magnesium Oxide (PHILLIPS) 500 MG (LAX) TABS Take 500 mg by mouth daily.        Last reviewed on 11/01/2012  2:54 PM by Storm Frisk, MD   Review of Systems  Constitutional: Negative for chills, diaphoresis, activity change, appetite change, fatigue and unexpected weight change.  HENT: Negative for hearing loss, nosebleeds, congestion, facial swelling, sneezing, mouth sores, trouble swallowing, neck stiffness, dental problem, voice change, postnasal drip, sinus pressure, tinnitus and ear discharge.   Eyes: Negative for photophobia, discharge, itching and visual disturbance.  Respiratory: Negative for apnea, cough, choking, chest tightness and stridor.   Cardiovascular: Negative for palpitations.  Gastrointestinal: Positive for constipation. Negative for nausea, blood in stool and abdominal distention.  Genitourinary: Negative for dysuria, urgency, frequency, hematuria, flank pain, decreased urine volume and difficulty urinating.  Musculoskeletal: Positive for back pain. Negative for myalgias, joint swelling, arthralgias and gait problem.  Skin: Negative for color change and pallor.  Neurological: Negative for dizziness, tremors, seizures, syncope, speech difficulty, weakness, light-headedness and numbness.  Hematological: Negative for adenopathy. Bruises/bleeds easily.  Psychiatric/Behavioral: Negative for confusion, sleep disturbance and agitation. The patient is nervous/anxious.        Objective:   Physical Exam  Filed Vitals:   11/01/12 1446  BP: 116/72  Pulse: 64  Temp: 97.7 F (36.5 C)  TempSrc: Oral  Height: 5\' 9"  (1.753 m)  Weight: 160 lb (72.576 kg)  SpO2: 100%    Gen: Pleasant, well-nourished, in no distress,  normal affect  ENT: No lesions,  mouth clear,  oropharynx clear, no postnasal drip  Neck: No JVD, no TMG, no carotid bruits  Lungs: No use of accessory  muscles, no dullness to percussion,distant BS  Cardiovascular: RRR, heart sounds normal, no murmur or gallops, no peripheral edema  Abdomen: soft and NT, no HSM,  BS normal  Musculoskeletal: No deformities, no cyanosis or clubbing  Neuro: alert, non focal  Skin: Warm, no lesions or rashes  Cleda Daub 11/01/2012:  FeV1 74% FeF 25 75 45%         Assessment & Plan:   COPD with emphysema, Gold B Gold stage B. COPD with primary emphysema stable at this time on Breo Ellipta inhaler Plan  Maintain inhaled medications as prescribed No nodular changes on CT scan are likely prior granulomatous disease and are not representing active infection at this time  and will be observed   Pulmonary infiltrate on chest x-ray Bilateral right middle lobe and left lingular tree in bud infiltrate compatible with Mycobacterium avium infection or granulomatous disease in the past. There is no evidence of active infection at this time. There is no indication for bronchoscopy or treatment.  Plan Observation for now    Updated Medication List Outpatient Encounter Prescriptions as of 11/01/2012  Medication Sig Dispense Refill  . ALPRAZolam (XANAX) 1 MG tablet Take 1 tablet (1 mg total) by mouth every 6 (six) hours as needed.  90 tablet  3  . B Complex-C (B-COMPLEX WITH VITAMIN C) tablet Take 1 tablet by mouth daily.        . Calcium-Vitamin D 600-125 MG-UNIT TABS Take by mouth daily.        . Fluticasone Furoate-Vilanterol (BREO ELLIPTA) 100-25 MCG/INH AEPB Inhale 1 puff into the lungs daily.  30 each  6  . gabapentin (NEURONTIN) 600 MG tablet Take 1 tablet (600 mg total) by mouth 4 (four) times daily.  120 tablet  3  . lansoprazole (PREVACID) 30 MG capsule Take 30 mg by mouth daily as needed.      . lidocaine-prilocaine (EMLA) cream Apply 1 application topically as needed.        . Multiple Vitamin (MULTIVITAMIN) tablet Take 1 tablet by mouth daily.        Marland Kitchen oxyCODONE-acetaminophen (PERCOCET) 10-325 MG per  tablet 1 - 2 tabs every 4 hours as needed for pain  120 tablet  0  . TURMERIC PO Take by mouth daily.       . vitamin E 200 UNIT capsule Take 200 Units by mouth daily.        . [DISCONTINUED] Fluticasone Furoate-Vilanterol (BREO ELLIPTA) 100-25 MCG/INH AEPB Inhale 1 puff into the lungs daily.  28 each  0  . [DISCONTINUED] lansoprazole (PREVACID) 30 MG capsule Take 1 capsule (30 mg total) by mouth daily.  30 capsule  3  . [DISCONTINUED] FLUoxetine (PROZAC) 20 MG capsule       . [DISCONTINUED] Magnesium Oxide (PHILLIPS) 500 MG (LAX) TABS Take 500 mg by mouth daily.

## 2012-11-05 ENCOUNTER — Ambulatory Visit: Payer: 59 | Admitting: Hematology & Oncology

## 2012-11-05 ENCOUNTER — Other Ambulatory Visit: Payer: Self-pay | Admitting: Lab

## 2012-11-05 ENCOUNTER — Ambulatory Visit: Payer: Self-pay | Admitting: Hematology & Oncology

## 2012-11-05 ENCOUNTER — Other Ambulatory Visit: Payer: 59 | Admitting: Lab

## 2012-12-10 ENCOUNTER — Other Ambulatory Visit: Payer: Self-pay | Admitting: *Deleted

## 2012-12-10 ENCOUNTER — Ambulatory Visit (HOSPITAL_BASED_OUTPATIENT_CLINIC_OR_DEPARTMENT_OTHER): Payer: 59

## 2012-12-10 DIAGNOSIS — Z452 Encounter for adjustment and management of vascular access device: Secondary | ICD-10-CM

## 2012-12-10 DIAGNOSIS — C180 Malignant neoplasm of cecum: Secondary | ICD-10-CM

## 2012-12-10 DIAGNOSIS — G629 Polyneuropathy, unspecified: Secondary | ICD-10-CM

## 2012-12-10 MED ORDER — GABAPENTIN 600 MG PO TABS: 600.0000 mg | ORAL_TABLET | Freq: Four times a day (QID) | ORAL | Status: DC

## 2012-12-10 MED ORDER — SODIUM CHLORIDE 0.9 % IJ SOLN
10.0000 mL | INTRAMUSCULAR | Status: DC | PRN
  Administered 2012-12-10: 10 mL via INTRAVENOUS
  Filled 2012-12-10: qty 10

## 2012-12-10 MED ORDER — OXYCODONE-ACETAMINOPHEN 10-325 MG PO TABS: ORAL_TABLET | ORAL | Status: DC

## 2012-12-10 MED ORDER — HEPARIN SOD (PORK) LOCK FLUSH 100 UNIT/ML IV SOLN
500.0000 [IU] | Freq: Once | INTRAVENOUS | Status: AC
  Administered 2012-12-10: 500 [IU] via INTRAVENOUS
  Filled 2012-12-10: qty 5

## 2012-12-10 NOTE — Telephone Encounter (Signed)
Pt requested a refill for Endocet & Gabapentin while here for her flush appt. Will route to Dr Myna Hidalgo for approval & signature then place at the front desk for pick up.

## 2012-12-10 NOTE — Patient Instructions (Signed)

## 2012-12-14 NOTE — Telephone Encounter (Signed)
Erroneous encounter opened.

## 2012-12-18 ENCOUNTER — Encounter (INDEPENDENT_AMBULATORY_CARE_PROVIDER_SITE_OTHER): Payer: Self-pay | Admitting: General Surgery

## 2012-12-20 ENCOUNTER — Other Ambulatory Visit: Payer: Self-pay | Admitting: *Deleted

## 2012-12-20 NOTE — Telephone Encounter (Signed)
Erroneous encounter

## 2013-01-14 ENCOUNTER — Other Ambulatory Visit (HOSPITAL_BASED_OUTPATIENT_CLINIC_OR_DEPARTMENT_OTHER): Payer: Self-pay | Admitting: Family Medicine

## 2013-01-14 ENCOUNTER — Encounter (HOSPITAL_BASED_OUTPATIENT_CLINIC_OR_DEPARTMENT_OTHER): Payer: Self-pay

## 2013-01-14 ENCOUNTER — Ambulatory Visit (HOSPITAL_BASED_OUTPATIENT_CLINIC_OR_DEPARTMENT_OTHER)
Admission: RE | Admit: 2013-01-14 | Discharge: 2013-01-14 | Disposition: A | Discharge status: Home / Self Care | Payer: 59 | Source: Ambulatory Visit | Attending: Hematology & Oncology | Admitting: Hematology & Oncology

## 2013-01-14 ENCOUNTER — Ambulatory Visit (HOSPITAL_BASED_OUTPATIENT_CLINIC_OR_DEPARTMENT_OTHER)
Admission: RE | Admit: 2013-01-14 | Discharge: 2013-01-14 | Disposition: A | Discharge status: Home / Self Care | Payer: 59 | Source: Ambulatory Visit | Attending: Family Medicine | Admitting: Family Medicine

## 2013-01-14 DIAGNOSIS — R911 Solitary pulmonary nodule: Secondary | ICD-10-CM | POA: Insufficient documentation

## 2013-01-14 DIAGNOSIS — Z1231 Encounter for screening mammogram for malignant neoplasm of breast: Secondary | ICD-10-CM

## 2013-01-14 DIAGNOSIS — R918 Other nonspecific abnormal finding of lung field: Secondary | ICD-10-CM | POA: Insufficient documentation

## 2013-01-14 DIAGNOSIS — C180 Malignant neoplasm of cecum: Secondary | ICD-10-CM

## 2013-01-14 MED ORDER — IOHEXOL 300 MG/ML  SOLN
100.0000 mL | Freq: Once | INTRAMUSCULAR | Status: AC | PRN
  Administered 2013-01-14: 100 mL via INTRAVENOUS

## 2013-01-22 ENCOUNTER — Other Ambulatory Visit: Payer: Self-pay | Admitting: Medical

## 2013-01-22 DIAGNOSIS — C180 Malignant neoplasm of cecum: Secondary | ICD-10-CM

## 2013-01-23 ENCOUNTER — Other Ambulatory Visit (HOSPITAL_BASED_OUTPATIENT_CLINIC_OR_DEPARTMENT_OTHER): Payer: 59 | Admitting: Lab

## 2013-01-23 ENCOUNTER — Ambulatory Visit (HOSPITAL_BASED_OUTPATIENT_CLINIC_OR_DEPARTMENT_OTHER): Payer: 59 | Admitting: Medical

## 2013-01-23 VITALS — BP 108/67 | HR 70 | Temp 97.8°F | Resp 16 | Ht 67.0 in | Wt 157.0 lb

## 2013-01-23 DIAGNOSIS — R918 Other nonspecific abnormal finding of lung field: Secondary | ICD-10-CM

## 2013-01-23 DIAGNOSIS — C180 Malignant neoplasm of cecum: Secondary | ICD-10-CM

## 2013-01-23 DIAGNOSIS — G620 Drug-induced polyneuropathy: Secondary | ICD-10-CM

## 2013-01-23 DIAGNOSIS — G629 Polyneuropathy, unspecified: Secondary | ICD-10-CM

## 2013-01-23 DIAGNOSIS — Z85038 Personal history of other malignant neoplasm of large intestine: Secondary | ICD-10-CM

## 2013-01-23 LAB — CBC WITH DIFFERENTIAL (CANCER CENTER ONLY)
Eosinophils Absolute: 0.2 10*3/uL (ref 0.0–0.5)
LYMPH#: 1.5 10*3/uL (ref 0.9–3.3)
MONO#: 0.3 10*3/uL (ref 0.1–0.9)
NEUT#: 3.2 10*3/uL (ref 1.5–6.5)
Platelets: 146 10*3/uL (ref 145–400)
RBC: 5.55 10*6/uL — ABNORMAL HIGH (ref 3.70–5.32)
WBC: 5.3 10*3/uL (ref 3.9–10.0)

## 2013-01-23 MED ORDER — OXYCODONE-ACETAMINOPHEN 10-325 MG PO TABS: ORAL_TABLET | ORAL | Status: DC

## 2013-01-23 NOTE — Progress Notes (Signed)
DIAGNOSIS:  Stage IIIB (T2 N2 M0) adenocarcinoma of the cecum. Chemotherapy-induced neuropathy.  CURRENT THERAPY:  Observation.  INTERIM HISTORY: Teresa Morgan presents today for an office followup visit.  Overall, she, reports, that she's doing relatively well.  She recently had a CT scan of the chest, abdomen, and pelvis back on 01/14/2013.  The CT of the chest reveal no acute findings.  There are multiple peripheral predominate vascular nodules noted, likely the sequela of an atypical infection.  There are also several new nodules within the right lung, which did not have peribronchovascular distribution.  Three-month followup is recommended.  CT of the abdomen, and pelvis revealed no evidence for mass or adenopathy within the abdomen or pelvis.  Of note, she does see Charlcie Cradle. Delford Field, MD.  She may have some degree of bronchiectasis.  Her last CEA was 1.6.  She still continues to have some neuropathy.  However, this mostly bothers her at nighttime.  She, reports, a good appetite.  She denies any nausea, vomiting, diarrhea, constipation, chest pain, shortness of breath, or cough.  She denies any fevers, chills, or night sweats.  She denies any melena.  She denies any obvious, or abnormal bleeding.  She denies any lower leg swelling.  She denies any headaches, visual changes, or rashes.  Review of Systems: Constitutional:Negative for malaise/fatigue, fever, chills, weight loss, diaphoresis, activity change, appetite change, and unexpected weight change.  HEENT: Negative for double vision, blurred vision, visual loss, ear pain, tinnitus, congestion, rhinorrhea, epistaxis sore throat or sinus disease, oral pain/lesion, tongue soreness Respiratory: Negative for cough, chest tightness, shortness of breath, wheezing and stridor.  Cardiovascular: Negative for chest pain, palpitations, leg swelling, orthopnea, PND, DOE or claudication Gastrointestinal: Negative for nausea, vomiting, abdominal pain, diarrhea,  constipation, blood in stool, melena, hematochezia, abdominal distention, anal bleeding, rectal pain, anorexia and hematemesis.  Genitourinary: Negative for dysuria, frequency, hematuria,  Musculoskeletal: Negative for myalgias, back pain, joint swelling, arthralgias and gait problem.  Skin: Negative for rash, color change, pallor and wound.  Neurological:. Negative for dizziness/light-headedness, tremors, seizures, syncope, facial asymmetry, speech difficulty, weakness, numbness, headaches and paresthesias.  Hematological: Negative for adenopathy. Does not bruise/bleed easily.  Psychiatric/Behavioral:  Negative for depression, no loss of interest in normal activity or change in sleep pattern.   Physical Exam: This is a 57 year old, well-developed, well-nourished Filipino female, in no obvious distress Vitals: Temperature 97.8 degrees, pulse 70, respirations 16, blood pressure 108/67, weight 157 pounds HEENT reveals a normocephalic, atraumatic skull, no scleral icterus, no oral lesions  Neck is supple without any cervical or supraclavicular adenopathy.  Lungs are clear to auscultation bilaterally. There are no wheezes, rales or rhonci Cardiac is regular rate and rhythm with a normal S1 and S2. There are no murmurs, rubs, or bruits.  Abdomen is soft with good bowel sounds, there is no palpable mass. There is no palpable hepatosplenomegaly. There is no palpable fluid wave.  Musculoskeletal no tenderness of the spine, ribs, or hips.  Extremities there are no clubbing, cyanosis, or edema.  Skin no petechia, purpura or ecchymosis Neurologic is nonfocal.  Laboratory Data: White count 5.3, hemoglobin 12.5, hematocrit 38.0, platelets 146,000  CT scan of the chest, abdomen, and pelvis on 01/14/2013. Impression: CT of the chest. #1.  No acute findings. #2.  Multiple peripheral predominate.  Vascular nodules, are again, noted and are likely the sequela of an atypical infection. #3.  There are several  new nodules within the right lung, which did not have peribronchovascular distribution.  Repeat CT scan in 3 months.  CT scan of the abdomen, and pelvis: #1.  No evidence for mass or adenopathy within the abdomen, or pelvis. #2.  No acute findings.   Current Outpatient Prescriptions on File Prior to Visit  Medication Sig Dispense Refill  . ALPRAZolam (XANAX) 1 MG tablet Take 1 tablet (1 mg total) by mouth every 6 (six) hours as needed.  90 tablet  3  . B Complex-C (B-COMPLEX WITH VITAMIN C) tablet Take 1 tablet by mouth daily.        . Calcium-Vitamin D 600-125 MG-UNIT TABS Take by mouth daily.        . Fluticasone Furoate-Vilanterol (BREO ELLIPTA) 100-25 MCG/INH AEPB Inhale 1 puff into the lungs daily.  30 each  6  . gabapentin (NEURONTIN) 600 MG tablet Take 1 tablet (600 mg total) by mouth 4 (four) times daily.  120 tablet  3  . lansoprazole (PREVACID) 30 MG capsule Take 30 mg by mouth daily as needed.      . lidocaine-prilocaine (EMLA) cream Apply 1 application topically as needed.        . Multiple Vitamin (MULTIVITAMIN) tablet Take 1 tablet by mouth daily.        Marland Kitchen oxyCODONE-acetaminophen (PERCOCET) 10-325 MG per tablet 1 - 2 tabs every 4 hours as needed for pain  120 tablet  0  . TURMERIC PO Take by mouth daily.       . vitamin E 200 UNIT capsule Take 200 Units by mouth daily.         No current facility-administered medications on file prior to visit.   Assessment/Plan: This is a pleasant, 57 year old, Filipino, female, with the following issues:  #1.  History of stage IIIB adenocarcinoma of the cecum.  She underwent resection.  She had 4 positive lymph nodes.  She completed FOLFOX in March of this year.  She had 12 cycles.  Again, her CT scan does not reveal any recurrent or metastatic disease.  She will continue to followup with Dr. Delford Field regarding the pulmonary nodules./Possible bronchiectasis.  We will go ahead and plan to repeat her scans in 3 months.  Hopefully, by then, we can  move her office visits out to every 6 months.  Followup.  We'll follow back up with Teresa Morgan in 3 months, but before then should there be questions or concerns.

## 2013-01-24 ENCOUNTER — Ambulatory Visit: Payer: 59 | Admitting: Hematology & Oncology

## 2013-01-24 ENCOUNTER — Other Ambulatory Visit: Payer: 59 | Admitting: Lab

## 2013-01-24 LAB — COMPREHENSIVE METABOLIC PANEL
Albumin: 4.2 g/dL (ref 3.5–5.2)
Alkaline Phosphatase: 62 U/L (ref 39–117)
BUN: 15 mg/dL (ref 6–23)
Calcium: 9 mg/dL (ref 8.4–10.5)
Chloride: 105 mEq/L (ref 96–112)
Glucose, Bld: 100 mg/dL — ABNORMAL HIGH (ref 70–99)
Potassium: 3.7 mEq/L (ref 3.5–5.3)

## 2013-01-25 ENCOUNTER — Telehealth: Payer: Self-pay | Admitting: *Deleted

## 2013-01-25 NOTE — Telephone Encounter (Signed)
Called patient to let her know that her CT scan was negative for cancer but reminded patient that she needs to quit smoking because there are some changes in her lungs that may cause her problems in the future.  Patient said that she quit smoking 10 years ago.

## 2013-01-29 ENCOUNTER — Ambulatory Visit: Payer: 59 | Admitting: Family

## 2013-02-11 ENCOUNTER — Encounter: Payer: Self-pay | Admitting: Family

## 2013-02-11 ENCOUNTER — Ambulatory Visit (INDEPENDENT_AMBULATORY_CARE_PROVIDER_SITE_OTHER): Payer: 59 | Admitting: Family

## 2013-02-11 VITALS — BP 120/70 | HR 77 | Temp 98.6°F | Resp 16 | Ht 67.5 in | Wt 157.0 lb

## 2013-02-11 DIAGNOSIS — K219 Gastro-esophageal reflux disease without esophagitis: Secondary | ICD-10-CM

## 2013-02-11 DIAGNOSIS — C180 Malignant neoplasm of cecum: Secondary | ICD-10-CM

## 2013-02-11 DIAGNOSIS — F3289 Other specified depressive episodes: Secondary | ICD-10-CM

## 2013-02-11 DIAGNOSIS — J438 Other emphysema: Secondary | ICD-10-CM

## 2013-02-11 DIAGNOSIS — J439 Emphysema, unspecified: Secondary | ICD-10-CM

## 2013-02-11 DIAGNOSIS — F329 Major depressive disorder, single episode, unspecified: Secondary | ICD-10-CM | POA: Insufficient documentation

## 2013-02-11 MED ORDER — BUPROPION HCL 75 MG PO TABS: 75.0000 mg | ORAL_TABLET | Freq: Two times a day (BID) | ORAL | Status: DC

## 2013-02-11 NOTE — Progress Notes (Signed)
Subjective:    Patient ID: Teresa Morgan, female    DOB: 26-Jan-1956, 57 y.o.   MRN: [id number]  HPI  Ms.  Boughner is a 57 yr old female who presents today to establish care.  Her past medical history is significant for adenocarcinoma of the cecum ( T2 N2 M0) which was diagnosed in 2011.  She had a resection which was followed by chemotherapy. She is followed by Dr. Loreta Ave for GI.    She presents today with concern re: depression.  Reports that her depression started in 2005 after she and her husband separated.  They had been married x 38 years.  She was treated with cymbalta at that time, but she developed lip swelling.  She was then switched to prozac which helped.  Then she developed lip swelling on prozac. She and her husband ultimately got back together.  She reports feeling unhappy at home.   She reports anxiety about retirement. Reports panic attacks about once a week.  Reports lack of motivation.  Reports that she does not sleep well due to neuropathy. Uses Xanax at night which helps her to sleep.   Pmhx also significant for COPD.  She is followed by Dr. Delford Field.   Anemia/gastritis- last H/H normal 2 weeks ago.  She continues prevacid which she reports controls her gerd symptoms.   Lung nodules- noted on CT performed by oncology in March- per note- they plan to repeat CT in 3 months.    Review of Systems See HPI  Past Medical History  Diagnosis Date  . Anemia   . Anxiety   . Arthritis   . Depression   . GERD (gastroesophageal reflux disease)   . Ulcer   . Colon cancer   . COPD with emphysema 06/18/2012    History   Social History  . Marital Status: Married    Spouse Name: N/A    Number of Children: 3  . 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 Smoking status: Former Smoker -- 1.00 packs/day for 25 years    Types: Cigarettes    Quit date: 06/08/2005  . Smokeless tobacco: Never Used  . Alcohol Use: 0.6  oz/week    1 Glasses of wine per week     Comment: social  . Drug Use: No  . Sexually Active: Not Currently    Birth Control/ Protection: Surgical   Other Topics Concern  . 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 Heel spurs    . Hemrrhoids    . Plantar fascia surgery  2009  . Colon resection  2012    for colon cancer  . Colon surgery  07/07/11    lap ileocolectomy  . Tubal ligation  07/1980  . Appendectomy  07/07/11    Family History  Problem Relation Age of Onset  . Diabetes Mother   . Colon cancer Cousin     Allergies  Allergen Reactions  . Aspirin     Upset stomach  . Cymbalta (Duloxetine Hcl) Swelling    Swollen lips     Current Outpatient Prescriptions on File Prior to Visit  Medication Sig Dispense Refill  . ALPRAZolam (XANAX) 1 MG tablet Take 1 tablet (  1 mg total) by mouth every 6 (six) hours as needed.  90 tablet  3  . B Complex-C (B-COMPLEX WITH VITAMIN C) tablet Take 1 tablet by mouth daily.        . Calcium-Vitamin D 600-125 MG-UNIT TABS Take by mouth daily.        . Fluticasone Furoate-Vilanterol (BREO ELLIPTA) 100-25 MCG/INH AEPB Inhale 1 puff into the lungs daily.  30 each  6  . gabapentin (NEURONTIN) 600 MG tablet Take 1 tablet (600 mg total) by mouth 4 (four) times daily.  120 tablet  3  . lansoprazole (PREVACID) 30 MG capsule Take 30 mg by mouth daily as needed.      . lidocaine-prilocaine (EMLA) cream Apply 1 application topically as needed.        . Multiple Vitamin (MULTIVITAMIN) tablet Take 1 tablet by mouth daily.        Marland Kitchen oxyCODONE-acetaminophen (PERCOCET) 10-325 MG per tablet 1 - 2 tabs every 4 hours as needed for pain  120 tablet  0  . TURMERIC PO Take by mouth daily.       . vitamin E 200 UNIT capsule Take 200 Units by mouth daily.         No current facility-administered medications on  file prior to visit.    BP 120/70  Pulse 77  Temp(Src) 98.6 F (37 C) (Oral)  Resp 16  Ht 5' 7.5" (1.715 m)  Wt 157 lb 0.6 oz (71.233 kg)  BMI 24.22 kg/m2  SpO2 99%       Objective:   Physical Exam  Constitutional: She is oriented to person, place, and time. She appears well-developed and well-nourished. No distress.  HENT:  Head: Normocephalic and atraumatic.  Cardiovascular: Normal rate and regular rhythm.   No murmur heard. Pulmonary/Chest: Effort normal and breath sounds normal. No respiratory distress. She has no wheezes. She has no rales. She exhibits no tenderness.  Musculoskeletal: She exhibits no edema.  Lymphadenopathy:    She has no cervical adenopathy.  Neurological: She is alert and oriented to person, place, and time.  Psychiatric: Her behavior is normal. Judgment and thought content normal.  Pleasant, intermittently tearful during interview          Assessment & Plan:  30 minutes spent with pt today.  >50% of this time was spent counseling pt on her depression and treatment.

## 2013-02-11 NOTE — Assessment & Plan Note (Signed)
Deteriorated.  Will give trial of Wellbutrin. She wishes to seek counseling through [employer] at her job.

## 2013-02-11 NOTE — Patient Instructions (Addendum)
Please schedule a follow up visit in 1 month for fasting physical.   Welcome!

## 2013-02-11 NOTE — Assessment & Plan Note (Signed)
Clinically stable. Followed by Dr. Delford Field.

## 2013-02-11 NOTE — Assessment & Plan Note (Signed)
Clinically stable- managed by oncology.  (Dr. Myna Hidalgo).

## 2013-02-11 NOTE — Assessment & Plan Note (Signed)
Stable on prevacid, continue same.  

## 2013-03-04 ENCOUNTER — Telehealth: Payer: Self-pay | Admitting: Hematology & Oncology

## 2013-03-04 NOTE — Telephone Encounter (Signed)
Patient called and changed flush apt time from 2pm to 10:30am

## 2013-03-06 ENCOUNTER — Other Ambulatory Visit: Payer: Self-pay | Admitting: *Deleted

## 2013-03-06 ENCOUNTER — Other Ambulatory Visit: Payer: Self-pay | Admitting: Hematology & Oncology

## 2013-03-06 ENCOUNTER — Ambulatory Visit (HOSPITAL_BASED_OUTPATIENT_CLINIC_OR_DEPARTMENT_OTHER): Payer: 59

## 2013-03-06 VITALS — BP 113/80 | HR 76 | Temp 97.0°F | Resp 18

## 2013-03-06 DIAGNOSIS — G629 Polyneuropathy, unspecified: Secondary | ICD-10-CM

## 2013-03-06 DIAGNOSIS — C180 Malignant neoplasm of cecum: Secondary | ICD-10-CM

## 2013-03-06 DIAGNOSIS — Z452 Encounter for adjustment and management of vascular access device: Secondary | ICD-10-CM

## 2013-03-06 DIAGNOSIS — C19 Malignant neoplasm of rectosigmoid junction: Secondary | ICD-10-CM

## 2013-03-06 DIAGNOSIS — F064 Anxiety disorder due to known physiological condition: Secondary | ICD-10-CM

## 2013-03-06 MED ORDER — HEPARIN SOD (PORK) LOCK FLUSH 100 UNIT/ML IV SOLN
500.0000 [IU] | Freq: Once | INTRAVENOUS | Status: AC
  Administered 2013-03-06: 500 [IU] via INTRAVENOUS
  Filled 2013-03-06: qty 5

## 2013-03-06 MED ORDER — SODIUM CHLORIDE 0.9 % IJ SOLN
10.0000 mL | INTRAMUSCULAR | Status: DC | PRN
  Administered 2013-03-06: 10 mL via INTRAVENOUS
  Filled 2013-03-06: qty 10

## 2013-03-06 MED ORDER — OXYCODONE-ACETAMINOPHEN 10-325 MG PO TABS: ORAL_TABLET | ORAL | Status: DC

## 2013-03-06 MED ORDER — ALPRAZOLAM 1 MG PO TABS: 1.0000 mg | ORAL_TABLET | Freq: Four times a day (QID) | ORAL | Status: DC | PRN

## 2013-03-06 MED ORDER — GABAPENTIN 600 MG PO TABS: 600.0000 mg | ORAL_TABLET | Freq: Four times a day (QID) | ORAL | Status: DC

## 2013-03-06 NOTE — Patient Instructions (Signed)

## 2013-03-06 NOTE — Telephone Encounter (Signed)
Pt requested a refill for Endocet, Gabapentin, & Xanax while here for her flush appt. Will route to Dr Myna Hidalgo for approval & signature.

## 2013-03-06 NOTE — Progress Notes (Signed)
Teresa Morgan presented for Portacath access and flush. Proper placement of portacath confirmed by CXR. Portacath located in the left chest wall accessed with  H 20 needle. Clean, Dry and Intact Good blood return present. Portacath flushed with 20ml NS and 500U/5ml Heparin per protocol and needle removed intact. Procedure without incident. Patient tolerated procedure well.   

## 2013-03-12 ENCOUNTER — Other Ambulatory Visit (HOSPITAL_COMMUNITY)
Admission: RE | Admit: 2013-03-12 | Discharge: 2013-03-12 | Disposition: A | Discharge status: Home / Self Care | Payer: 59 | Source: Ambulatory Visit | Attending: Family | Admitting: Family

## 2013-03-12 ENCOUNTER — Encounter: Payer: Self-pay | Admitting: Family

## 2013-03-12 ENCOUNTER — Ambulatory Visit (INDEPENDENT_AMBULATORY_CARE_PROVIDER_SITE_OTHER): Payer: 59 | Admitting: Family

## 2013-03-12 VITALS — BP 100/70 | HR 73 | Temp 97.8°F | Resp 14 | Ht 67.5 in | Wt 156.1 lb

## 2013-03-12 DIAGNOSIS — R0989 Other specified symptoms and signs involving the circulatory and respiratory systems: Secondary | ICD-10-CM

## 2013-03-12 DIAGNOSIS — R0683 Snoring: Secondary | ICD-10-CM | POA: Insufficient documentation

## 2013-03-12 DIAGNOSIS — Z0000 Encounter for general adult medical examination without abnormal findings: Secondary | ICD-10-CM | POA: Insufficient documentation

## 2013-03-12 DIAGNOSIS — Z01419 Encounter for gynecological examination (general) (routine) without abnormal findings: Secondary | ICD-10-CM

## 2013-03-12 DIAGNOSIS — R739 Hyperglycemia, unspecified: Secondary | ICD-10-CM

## 2013-03-12 DIAGNOSIS — Z23 Encounter for immunization: Secondary | ICD-10-CM

## 2013-03-12 DIAGNOSIS — R7309 Other abnormal glucose: Secondary | ICD-10-CM

## 2013-03-12 DIAGNOSIS — F329 Major depressive disorder, single episode, unspecified: Secondary | ICD-10-CM

## 2013-03-12 LAB — LIPID PANEL
Cholesterol: 180 mg/dL (ref 0–200)
Total CHOL/HDL Ratio: 4.2 Ratio
VLDL: 25 mg/dL (ref 0–40)

## 2013-03-12 LAB — HEPATIC FUNCTION PANEL
AST: 17 U/L (ref 0–37)
Bilirubin, Direct: 0.1 mg/dL (ref 0.0–0.3)
Total Bilirubin: 0.4 mg/dL (ref 0.3–1.2)

## 2013-03-12 MED ORDER — VORTIOXETINE HBR 20 MG PO TABS: 1.0000 | ORAL_TABLET | Freq: Every day | ORAL | Status: DC

## 2013-03-12 NOTE — Assessment & Plan Note (Signed)
Pt with snoring, witnessed apneas, daytime somnolence- obtain home sleep study.

## 2013-03-12 NOTE — Progress Notes (Signed)
Subjective:    Patient ID: Teresa Morgan, female    DOB: 05-Jun-1956, 57 y.o.   MRN: [id number]  HPI  Patient presents today for complete physical.  Immunizations: due for tetanus and pneumovax Diet: healthy Exercise: has not returned to exercise since her chemo- plans to start stationary bike. Colonoscopy: up to date Dexa: Due Pap Smear: due Mammogram:  Depression- last visit she was started on wellbutrin.  Reports that she thinks she is having allergic reaction. Has had some swelling of her lips and itching. Has been using benadryl. Reports depression is about the same, notes irritability.  Snoring- reports that she wakes up tired, daughter thinks she may have sleep apnea due to witnessed events.     Review of Systems  Constitutional: Negative for unexpected weight change.  HENT: Negative for hearing loss and congestion.   Eyes: Negative for visual disturbance.  Respiratory: Negative for cough.   Gastrointestinal: Negative for vomiting, diarrhea and constipation.  Genitourinary: Negative for dysuria and frequency.  Musculoskeletal: Positive for back pain and arthralgias.  Skin: Negative for rash.  Neurological: Negative for headaches.  Hematological: Negative for adenopathy.  Psychiatric/Behavioral:       Reports + depression no significant anxiety       Past Medical History  Diagnosis Date  . Anemia   . Anxiety   . Arthritis   . Depression   . GERD (gastroesophageal reflux disease)   . Ulcer   . Colon cancer   . COPD with emphysema 06/18/2012    History   Social History  . Marital Status: Married    Spouse Name: N/A    Number of Children: 3  . 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 Smoking status: Former Smoker -- 1.00 packs/day for 25 years    Types: Cigarettes    Quit date: 06/08/2005  . Smokeless tobacco: Never Used  . Alcohol Use: 0.6 oz/week    1 Glasses of wine per week      Comment: social  . Drug Use: No  . Sexually Active: Not Currently    Birth Control/ Protection: Surgical   Other Topics Concern  . 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 Heel spurs    . Hemrrhoids    . Plantar fascia surgery  2009  . Colon resection  2012    for colon cancer  . Colon surgery  07/07/11    lap ileocolectomy  . Tubal ligation  07/1980  . Appendectomy  07/07/11    Family History  Problem Relation Age of Onset  . Diabetes Mother   . Colon cancer Cousin     Allergies  Allergen Reactions  . Aspirin     Upset stomach  . Cymbalta (Duloxetine Hcl) Swelling    Swollen lips   . Prozac (Fluoxetine Hcl) Swelling    Lip swelling  . Wellbutrin (Bupropion) Swelling    Lip swelling    Current Outpatient Prescriptions on File Prior to Visit  Medication Sig Dispense Refill  . ALPRAZolam (XANAX) 1 MG tablet Take 1 tablet (1 mg total) by mouth every 6 (six) hours as needed.  90 tablet  3  . B Complex-C (B-COMPLEX WITH VITAMIN C)  tablet Take 1 tablet by mouth daily.        Marland Kitchen buPROPion (WELLBUTRIN) 75 MG tablet Take 1 tablet (75 mg total) by mouth 2 (two) times daily.  60 tablet  0  . Calcium-Vitamin D 600-125 MG-UNIT TABS Take by mouth daily.        . Fluticasone Furoate-Vilanterol (BREO ELLIPTA IN) Inhale 1 puff into the lungs daily.      . Fluticasone Furoate-Vilanterol (BREO ELLIPTA) 100-25 MCG/INH AEPB Inhale 1 puff into the lungs daily.  30 each  6  . gabapentin (NEURONTIN) 600 MG tablet Take 1 tablet (600 mg total) by mouth 4 (four) times daily.  120 tablet  3  . lansoprazole (PREVACID) 30 MG capsule Take 30 mg by mouth daily as needed.      . lidocaine-prilocaine (EMLA) cream APPLY TO PORT SITE 1 HOUR PRIOR TO CHEMO  30 g  4  . Multiple Vitamin (MULTIVITAMIN) tablet Take 1 tablet by mouth daily.        Marland Kitchen  oxyCODONE-acetaminophen (PERCOCET) 10-325 MG per tablet 1 - 2 tabs every 4 hours as needed for pain  120 tablet  0  . TURMERIC PO Take by mouth daily.       . vitamin E 200 UNIT capsule Take 200 Units by mouth daily.         No current facility-administered medications on file prior to visit.    BP 100/70  Pulse 73  Temp(Src) 97.8 F (36.6 C) (Oral)  Resp 14  Ht 5' 7.5" (1.715 m)  Wt 156 lb 1.3 oz (70.797 kg)  BMI 24.07 kg/m2  SpO2 99%    Objective:   Physical Exam  Physical Exam  Constitutional: She is oriented to person, place, and time. She appears well-developed and well-nourished. No distress.  HENT:  Head: Normocephalic and atraumatic.  Right Ear: Tympanic membrane and ear canal normal.  Left Ear: Tympanic membrane and ear canal normal.  Mouth/Throat: Oropharynx is clear and moist.  Eyes: Pupils are equal, round, and reactive to light. No scleral icterus.  Neck: Normal range of motion. No thyromegaly present.  Cardiovascular: Normal rate and regular rhythm.   No murmur heard. Pulmonary/Chest: Effort normal and breath sounds normal. No respiratory distress. He has no wheezes. She has no rales. She exhibits no tenderness.  Abdominal: Soft. Bowel sounds are normal. He exhibits no distension and no mass. There is no tenderness. There is no rebound and no guarding.  Musculoskeletal: She exhibits no edema.  Lymphadenopathy:    She has no cervical adenopathy.  Neurological: She is alert and oriented to person, place, and time. She exhibits normal muscle tone. Coordination normal.  Skin: Skin is warm and dry.  Psychiatric: She has a normal mood and affect. Her behavior is normal. Judgment and thought content normal.  Breasts: Examined lying Right: Without masses, retractions, discharge or axillary adenopathy.  Left: Without masses, retractions, discharge or axillary adenopathy.  Inguinal/mons: Normal without inguinal adenopathy  External genitalia: Normal   BUS/Urethra/Skene's glands: Normal  Bladder: Normal  Vagina: Normal  Cervix: Normal  Uterus: normal in size, shape and contour. Midline and mobile  Adnexa/parametria:  Rt: Without masses or tenderness.  Lt: Without masses or tenderness.  Anus and perineum: Normal           Assessment & Plan:         Assessment & Plan:

## 2013-03-12 NOTE — Assessment & Plan Note (Addendum)
Unchanged. Stop Wellbutrin. We are limited in our options due to multiple drug intolerances.  Will give trial of brintellix.  10mg  samples provided #21. She is instructed to go to ED if suicidal ideations occur.

## 2013-03-12 NOTE — Assessment & Plan Note (Signed)
Pt counseled on healthy diet, BSE and exercise.  Obtain fasting labs.  Will also check A1C as she is noted to have some borderline elevated blood sugars in the past.  Pap performed today. Tdap and pneumovax today.

## 2013-03-12 NOTE — Patient Instructions (Addendum)
Please schedule bone density at the front desk. Complete lab work prior to leaving. Follow up in 1 month.

## 2013-03-13 LAB — URINALYSIS, ROUTINE W REFLEX MICROSCOPIC
Bilirubin Urine: NEGATIVE
Glucose, UA: NEGATIVE mg/dL
Hgb urine dipstick: NEGATIVE
Ketones, ur: NEGATIVE mg/dL
Leukocytes, UA: NEGATIVE
Protein, ur: NEGATIVE mg/dL

## 2013-03-15 ENCOUNTER — Encounter: Payer: Self-pay | Admitting: Family

## 2013-03-26 ENCOUNTER — Ambulatory Visit (INDEPENDENT_AMBULATORY_CARE_PROVIDER_SITE_OTHER): Payer: 59

## 2013-03-26 DIAGNOSIS — R0683 Snoring: Secondary | ICD-10-CM

## 2013-03-26 DIAGNOSIS — G4733 Obstructive sleep apnea (adult) (pediatric): Secondary | ICD-10-CM

## 2013-04-09 ENCOUNTER — Telehealth: Payer: Self-pay | Admitting: Family

## 2013-04-09 NOTE — Telephone Encounter (Signed)
Message copied by Sandford Craze on Tue Apr 09, 2013 10:02 AM ------      Message from: Cyril Mourning V      Created: Fri Apr 05, 2013  1:17 PM       No OSA ------

## 2013-04-09 NOTE — Telephone Encounter (Signed)
Please call pt and let her know that her sleep study is negative for sleep apnea.

## 2013-04-09 NOTE — Telephone Encounter (Signed)
Left message on home and cell#s to return my call. 

## 2013-04-10 NOTE — Telephone Encounter (Signed)
Pt left message returning my call that she will be at her home # until 2pm. Left detailed message on home # re: result and to call if any questions.

## 2013-04-12 ENCOUNTER — Ambulatory Visit (INDEPENDENT_AMBULATORY_CARE_PROVIDER_SITE_OTHER): Payer: 59 | Admitting: Family

## 2013-04-12 ENCOUNTER — Encounter: Payer: Self-pay | Admitting: Family

## 2013-04-12 VITALS — BP 112/70 | HR 74 | Temp 98.1°F | Resp 16 | Ht 67.5 in | Wt 154.0 lb

## 2013-04-12 DIAGNOSIS — R197 Diarrhea, unspecified: Secondary | ICD-10-CM

## 2013-04-12 DIAGNOSIS — A084 Viral intestinal infection, unspecified: Secondary | ICD-10-CM | POA: Insufficient documentation

## 2013-04-12 DIAGNOSIS — F329 Major depressive disorder, single episode, unspecified: Secondary | ICD-10-CM

## 2013-04-12 DIAGNOSIS — A088 Other specified intestinal infections: Secondary | ICD-10-CM

## 2013-04-12 DIAGNOSIS — J438 Other emphysema: Secondary | ICD-10-CM

## 2013-04-12 DIAGNOSIS — J439 Emphysema, unspecified: Secondary | ICD-10-CM

## 2013-04-12 LAB — CBC WITH DIFFERENTIAL/PLATELET
Basophils Absolute: 0 10*3/uL (ref 0.0–0.1)
Basophils Relative: 0 % (ref 0–1)
HCT: 39.8 % (ref 36.0–46.0)
MCHC: 33.4 g/dL (ref 30.0–36.0)
Monocytes Absolute: 0.4 10*3/uL (ref 0.1–1.0)
Neutro Abs: 4.7 10*3/uL (ref 1.7–7.7)
Neutrophils Relative %: 66 % (ref 43–77)
Platelets: 181 10*3/uL (ref 150–400)
RDW: 15.9 % — ABNORMAL HIGH (ref 11.5–15.5)

## 2013-04-12 LAB — BASIC METABOLIC PANEL
BUN: 12 mg/dL (ref 6–23)
CO2: 27 mEq/L (ref 19–32)
Calcium: 9.6 mg/dL (ref 8.4–10.5)
Creat: 0.85 mg/dL (ref 0.50–1.10)
Glucose, Bld: 92 mg/dL (ref 70–99)

## 2013-04-12 MED ORDER — VORTIOXETINE HBR 20 MG PO TABS: 1.0000 | ORAL_TABLET | Freq: Every day | ORAL | Status: DC

## 2013-04-12 NOTE — Assessment & Plan Note (Addendum)
Symptoms most consistent with viral gastroenteritis.  Recommended that pt use prn imodium, drink plenty of fluids and call if symptoms are not improved in 2-3 days. Note provided for her to remain out of work this evening. Obtain bmet and cbc

## 2013-04-12 NOTE — Progress Notes (Signed)
Subjective:    Patient ID: Teresa Morgan, female    DOB: 1956/09/03, 57 y.o.   MRN: [id number]  HPI  Teresa Morgan is a 57 yr old female who presents today for follow up.  1) Depression- last visit Brintellix was started. Reports that she is feeling better since starting this medication.    2) SOB- She reports that she only feels short of breath is she is walking and "overexerting."  She continues her inhaler.    3) Nausea- reports some generalized abdominal discomfort x 2 weeks, nausea, diarrhea, but no vomitting.    Review of Systems    see HPI  Past Medical History  Diagnosis Date  . Anemia   . Anxiety   . Arthritis   . Depression   . GERD (gastroesophageal reflux disease)   . Ulcer   . Colon cancer   . COPD with emphysema 06/18/2012    History   Social History  . Marital Status: Married    Spouse Name: N/A    Number of Children: 3  . 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 Smoking status: Former Smoker -- 1.00 packs/day for 25 years    Types: Cigarettes    Quit date: 06/08/2005  . Smokeless tobacco: Never Used  . Alcohol Use: 0.6 oz/week    1 Glasses of wine per week     Comment: social  . Drug Use: No  . Sexually Active: Not Currently    Birth Control/ Protection: Surgical   Other Topics Concern  . 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 Heel spurs    . Hemrrhoids    . Plantar fascia surgery  2009  . Colon resection  2012    for colon cancer  . Colon surgery  07/07/11    lap ileocolectomy  . Tubal ligation  07/1980  . Appendectomy  07/07/11    Family History  Problem Relation Age of Onset  . Diabetes Mother   . Colon cancer Cousin     Allergies  Allergen Reactions  . Aspirin     Upset stomach  . Cymbalta (Duloxetine Hcl)  Swelling    Swollen lips   . Prozac (Fluoxetine Hcl) Swelling    Lip swelling  . Wellbutrin (Bupropion) Swelling    Lip swelling    Current Outpatient Prescriptions on File Prior to Visit  Medication Sig Dispense Refill  . ALPRAZolam (XANAX) 1 MG tablet Take 1 tablet (1 mg total) by mouth every 6 (six) hours as needed.  90 tablet  3  . B Complex-C (B-COMPLEX WITH VITAMIN C) tablet Take 1 tablet by mouth daily.        . Calcium-Vitamin D 600-125 MG-UNIT TABS Take by mouth daily.        . Fluticasone Furoate-Vilanterol (BREO ELLIPTA IN) Inhale 1 puff into the lungs daily.      . Fluticasone Furoate-Vilanterol (BREO ELLIPTA) 100-25 MCG/INH AEPB Inhale 1 puff into the lungs daily.  30 each  6  . gabapentin (NEURONTIN) 600 MG tablet Take 1 tablet (600 mg total) by mouth 4 (four) times daily.  120 tablet  3  . lansoprazole (PREVACID) 30 MG capsule Take 30 mg by  mouth daily as needed.      . lidocaine-prilocaine (EMLA) cream APPLY TO PORT SITE 1 HOUR PRIOR TO CHEMO  30 g  4  . Multiple Vitamin (MULTIVITAMIN) tablet Take 1 tablet by mouth daily.        Marland Kitchen oxyCODONE-acetaminophen (PERCOCET) 10-325 MG per tablet 1 - 2 tabs every 4 hours as needed for pain  120 tablet  0  . TURMERIC PO Take by mouth daily.       . vitamin E 200 UNIT capsule Take 200 Units by mouth daily.         No current facility-administered medications on file prior to visit.    BP 112/70  Pulse 74  Temp(Src) 98.1 F (36.7 C) (Oral)  Resp 16  Ht 5' 7.5" (1.715 m)  Wt 154 lb (69.854 kg)  BMI 23.75 kg/m2  SpO2 99%    Objective:   Physical Exam  Constitutional: She appears well-developed and well-nourished. No distress.  Cardiovascular: Normal rate and regular rhythm.   No murmur heard. Pulmonary/Chest: Effort normal and breath sounds normal.  Psychiatric: She has a normal mood and affect. Her behavior is normal. Judgment and thought content normal.          Assessment & Plan:

## 2013-04-12 NOTE — Assessment & Plan Note (Signed)
Improved on brintellix. Continue same.

## 2013-04-12 NOTE — Patient Instructions (Addendum)
Please drink plenty of fluids.  Call if diarrhea/nausea worsens or if it is not improved in 2-3 days.  Follow up in 3 months.

## 2013-04-12 NOTE — Assessment & Plan Note (Signed)
COPD is likely cause for her SOB.  Lungs clear today. Continue Breo.

## 2013-04-15 ENCOUNTER — Telehealth: Payer: Self-pay | Admitting: *Deleted

## 2013-04-15 ENCOUNTER — Telehealth: Payer: Self-pay | Admitting: Hematology & Oncology

## 2013-04-15 ENCOUNTER — Ambulatory Visit (HOSPITAL_BASED_OUTPATIENT_CLINIC_OR_DEPARTMENT_OTHER): Payer: 59

## 2013-04-15 ENCOUNTER — Ambulatory Visit (HOSPITAL_BASED_OUTPATIENT_CLINIC_OR_DEPARTMENT_OTHER)
Admission: RE | Admit: 2013-04-15 | Discharge: 2013-04-15 | Disposition: A | Discharge status: Home / Self Care | Payer: 59 | Source: Ambulatory Visit | Attending: Medical | Admitting: Medical

## 2013-04-15 ENCOUNTER — Encounter (HOSPITAL_BASED_OUTPATIENT_CLINIC_OR_DEPARTMENT_OTHER): Payer: Self-pay

## 2013-04-15 ENCOUNTER — Ambulatory Visit (HOSPITAL_BASED_OUTPATIENT_CLINIC_OR_DEPARTMENT_OTHER): Admission: RE | Admit: 2013-04-15 | Payer: 59 | Source: Ambulatory Visit

## 2013-04-15 DIAGNOSIS — I700 Atherosclerosis of aorta: Secondary | ICD-10-CM | POA: Insufficient documentation

## 2013-04-15 DIAGNOSIS — C180 Malignant neoplasm of cecum: Secondary | ICD-10-CM | POA: Insufficient documentation

## 2013-04-15 DIAGNOSIS — R918 Other nonspecific abnormal finding of lung field: Secondary | ICD-10-CM | POA: Insufficient documentation

## 2013-04-15 DIAGNOSIS — I2510 Atherosclerotic heart disease of native coronary artery without angina pectoris: Secondary | ICD-10-CM | POA: Insufficient documentation

## 2013-04-15 DIAGNOSIS — M4120 Other idiopathic scoliosis, site unspecified: Secondary | ICD-10-CM | POA: Insufficient documentation

## 2013-04-15 DIAGNOSIS — D1800 Hemangioma unspecified site: Secondary | ICD-10-CM | POA: Insufficient documentation

## 2013-04-15 DIAGNOSIS — J438 Other emphysema: Secondary | ICD-10-CM | POA: Insufficient documentation

## 2013-04-15 MED ORDER — IOHEXOL 300 MG/ML  SOLN
100.0000 mL | Freq: Once | INTRAMUSCULAR | Status: AC | PRN
  Administered 2013-04-15: 100 mL via INTRAVENOUS

## 2013-04-15 NOTE — Telephone Encounter (Signed)
Received message from pt requesting lab results from Friday. Also wanted you to know that she did not take the anti-depressant over the weekend and diarrhea stopped. She restarted anti-depressant today and diarrhea returned.

## 2013-04-15 NOTE — Telephone Encounter (Signed)
Left message on cell# to return my call. 

## 2013-04-15 NOTE — Telephone Encounter (Signed)
Kidney function, electrolytes, white blood cell count and Hemoglobin normal.  Lets have her cut brintellix in half and take 1/2 tab once daily.  We will see if this will improve her diarrhea and still manage depression.  Let me know in 1 week how it is going.

## 2013-04-15 NOTE — Telephone Encounter (Signed)
Left pt message to call. Per Teresa Morgan pt missed CT today. (I need to reschedule 6-5 MD appointment if she doesn't get CT) Amy RN aware

## 2013-04-16 ENCOUNTER — Telehealth: Payer: Self-pay | Admitting: Hematology & Oncology

## 2013-04-16 NOTE — Telephone Encounter (Signed)
Patient called and change 04/17/13 apt time from 11:30 to12noon

## 2013-04-16 NOTE — Telephone Encounter (Signed)
Notified pt and she voices understanding. 

## 2013-04-16 NOTE — Telephone Encounter (Signed)
Left message on cell# to return my call and let us know if it is ok to leave detailed message on her voicemail.

## 2013-04-17 ENCOUNTER — Ambulatory Visit (HOSPITAL_BASED_OUTPATIENT_CLINIC_OR_DEPARTMENT_OTHER): Payer: 59 | Admitting: Medical

## 2013-04-17 ENCOUNTER — Ambulatory Visit: Payer: 59 | Admitting: Medical

## 2013-04-17 ENCOUNTER — Other Ambulatory Visit (HOSPITAL_BASED_OUTPATIENT_CLINIC_OR_DEPARTMENT_OTHER): Payer: 59 | Admitting: Lab

## 2013-04-17 ENCOUNTER — Other Ambulatory Visit: Payer: 59 | Admitting: Lab

## 2013-04-17 ENCOUNTER — Ambulatory Visit (HOSPITAL_BASED_OUTPATIENT_CLINIC_OR_DEPARTMENT_OTHER): Payer: 59

## 2013-04-17 VITALS — BP 108/64 | HR 67 | Temp 97.9°F | Resp 16 | Ht 67.0 in | Wt 154.0 lb

## 2013-04-17 DIAGNOSIS — G629 Polyneuropathy, unspecified: Secondary | ICD-10-CM

## 2013-04-17 DIAGNOSIS — Z452 Encounter for adjustment and management of vascular access device: Secondary | ICD-10-CM

## 2013-04-17 DIAGNOSIS — C180 Malignant neoplasm of cecum: Secondary | ICD-10-CM

## 2013-04-17 DIAGNOSIS — G620 Drug-induced polyneuropathy: Secondary | ICD-10-CM

## 2013-04-17 DIAGNOSIS — R918 Other nonspecific abnormal finding of lung field: Secondary | ICD-10-CM

## 2013-04-17 LAB — COMPREHENSIVE METABOLIC PANEL
ALT: 13 U/L (ref 0–35)
AST: 16 U/L (ref 0–37)
Albumin: 4.4 g/dL (ref 3.5–5.2)
Calcium: 8.7 mg/dL (ref 8.4–10.5)
Chloride: 102 mEq/L (ref 96–112)
Potassium: 3.8 mEq/L (ref 3.5–5.3)
Total Protein: 6.6 g/dL (ref 6.0–8.3)

## 2013-04-17 LAB — CBC WITH DIFFERENTIAL (CANCER CENTER ONLY)
BASO%: 0.2 % (ref 0.0–2.0)
EOS%: 3.2 % (ref 0.0–7.0)
HGB: 12.4 g/dL (ref 11.6–15.9)
LYMPH#: 1.7 10*3/uL (ref 0.9–3.3)
MCHC: 32.4 g/dL (ref 32.0–36.0)
NEUT#: 3.3 10*3/uL (ref 1.5–6.5)
RDW: 15.5 % (ref 11.1–15.7)

## 2013-04-17 MED ORDER — SODIUM CHLORIDE 0.9 % IJ SOLN
10.0000 mL | INTRAMUSCULAR | Status: DC | PRN
  Administered 2013-04-17: 10 mL via INTRAVENOUS
  Filled 2013-04-17: qty 10

## 2013-04-17 MED ORDER — OXYCODONE-ACETAMINOPHEN 10-325 MG PO TABS: ORAL_TABLET | ORAL | Status: DC

## 2013-04-17 MED ORDER — HEPARIN SOD (PORK) LOCK FLUSH 100 UNIT/ML IV SOLN
500.0000 [IU] | Freq: Once | INTRAVENOUS | Status: AC
  Administered 2013-04-17: 500 [IU] via INTRAVENOUS
  Filled 2013-04-17: qty 5

## 2013-04-17 NOTE — Progress Notes (Signed)
DIAGNOSIS:  #1. Stage IIIB (T2 N2 M0) adenocarcinoma of the cecum. #2.  Chemotherapy-induced neuropathy.  CURRENT THERAPY:  Observation.  INTERIM HISTORY: [name] presents today for an office followup visit.  Overall, she, reports, that she's doing relatively well.  She recently had a CT scan of the chest, abdomen, and pelvis back on 04/15/2013.  The CT of the chest revealed stable underlying emphysematous changes.  Pulmonary nodules are stable.  Some are slightly larger and some have resolved.  No mediastinal or hilar mass or adenopathy.  CT of the abdomen, and pelvis revealed no evidence for mass or adenopathy within the abdomen or pelvis.  Of note, she does see Teresa Morgan. Delford Field, MD.  She may have some degree of bronchiectasis.  Her last CEA was 1.4.  She still continues to have some neuropathy.  However, this mostly bothers her at nighttime.  She, reports, a good appetite.  She denies any nausea, vomiting, diarrhea, constipation, chest pain, shortness of breath, or cough.  She denies any fevers, chills, or night sweats.  She denies any melena.  She denies any obvious, or abnormal bleeding.  She denies any lower leg swelling.  She denies any headaches, visual changes, or rashes.  Review of Systems: Constitutional:Negative for malaise/fatigue, fever, chills, weight loss, diaphoresis, activity change, appetite change, and unexpected weight change.  HEENT: Negative for double vision, blurred vision, visual loss, ear pain, tinnitus, congestion, rhinorrhea, epistaxis sore throat or sinus disease, oral pain/lesion, tongue soreness Respiratory: Negative for cough, chest tightness, shortness of breath, wheezing and stridor.  Cardiovascular: Negative for chest pain, palpitations, leg swelling, orthopnea, PND, DOE or claudication Gastrointestinal: Negative for nausea, vomiting, abdominal pain, diarrhea, constipation, blood in stool, melena, hematochezia, abdominal distention, anal bleeding, rectal pain,  anorexia and hematemesis.  Genitourinary: Negative for dysuria, frequency, hematuria,  Musculoskeletal: Negative for myalgias, back pain, joint swelling, arthralgias and gait problem.  Skin: Negative for rash, color change, pallor and wound.  Neurological:. Negative for dizziness/light-headedness, tremors, seizures, syncope, facial asymmetry, speech difficulty, weakness, numbness, headaches and paresthesias.  Hematological: Negative for adenopathy. Does not bruise/bleed easily.  Psychiatric/Behavioral:  Negative for depression, no loss of interest in normal activity or change in sleep pattern.   Physical Exam: This is a 57 year old, well-developed, well-nourished Filipino female, in no obvious distress Vitals: Temperature 97.9 degrees pulse 67 respirations 16 blood pressure 108/64 weight 154 pounds HEENT reveals a normocephalic, atraumatic skull, no scleral icterus, no oral lesions  Neck is supple without any cervical or supraclavicular adenopathy.  Lungs are clear to auscultation bilaterally. There are no wheezes, rales or rhonci Cardiac is regular rate and rhythm with a normal S1 and S2. There are no murmurs, rubs, or bruits.  Abdomen is soft with good bowel sounds, there is no palpable mass. There is no palpable hepatosplenomegaly. There is no palpable fluid wave.  Musculoskeletal no tenderness of the spine, ribs, or hips.  Extremities there are no clubbing, cyanosis, or edema.  Skin no petechia, purpura or ecchymosis Neurologic is nonfocal.  Laboratory Data: White count 5.6 hemoglobin 12.4 hematocrit 38.3 layers 160,000  CT scan of the chest, abdomen, and pelvis on 01/14/2013. Impression: CT of the chest. #1.  Stable underlying emphysematous changes. #2.  Bilateral pulmonary nodules some of which are slightly larger, some of which have resolved and others which are stable. #3.  Chronic tree in bud appearance and vague nodularity likely chronic inflammation or atypical infection. #4.   No mediastinal or hilar mass or adenopathy.  CT scan  of the abdomen, and pelvis: #1.  No CT findings for metastatic disease involving the abdomen or pelvis.  Current Outpatient Prescriptions on File Prior to Visit  Medication Sig Dispense Refill  . ALPRAZolam (XANAX) 1 MG tablet Take 1 tablet (1 mg total) by mouth every 6 (six) hours as needed.  90 tablet  3  . B Complex-C (B-COMPLEX WITH VITAMIN C) tablet Take 1 tablet by mouth daily.        . Calcium-Vitamin D 600-125 MG-UNIT TABS Take by mouth daily.        . Fluticasone Furoate-Vilanterol (BREO ELLIPTA) 100-25 MCG/INH AEPB Inhale 1 puff into the lungs daily.  30 each  6  . gabapentin (NEURONTIN) 600 MG tablet Take 1 tablet (600 mg total) by mouth 4 (four) times daily.  120 tablet  3  . lansoprazole (PREVACID) 30 MG capsule Take 30 mg by mouth daily as needed.      . lidocaine-prilocaine (EMLA) cream Apply 1 application topically as needed.        . Multiple Vitamin (MULTIVITAMIN) tablet Take 1 tablet by mouth daily.        Marland Kitchen oxyCODONE-acetaminophen (PERCOCET) 10-325 MG per tablet 1 - 2 tabs every 4 hours as needed for pain  120 tablet  0  . TURMERIC PO Take by mouth daily.       . vitamin E 200 UNIT capsule Take 200 Units by mouth daily.         No current facility-administered medications on file prior to visit.   Assessment/Plan: This is a pleasant, 57 year old, Filipino, female, with the following issues:  #1.  History of stage IIIB adenocarcinoma of the cecum.  She underwent resection.  She had 4 positive lymph nodes.  She completed FOLFOX in March of this year.  She had 12 cycles.  Again, her CT scan does not reveal any recurrent or metastatic disease.  She will continue to followup with Dr. Delford Field regarding the pulmonary nodules./Possible bronchiectasis.  We will go ahead and plan to repeat her scans in 6 months.   #2.  Followup.  We'll follow back up with [name] in 6 months, but before then should there be questions or  concerns.

## 2013-04-17 NOTE — Patient Instructions (Addendum)
Implanted Port Instructions  An implanted port is a central line that has a round shape and is placed under the skin. It is used for long-term IV (intravenous) access for:  · Medicine.  · Fluids.  · Liquid nutrition, such as TPN (total parenteral nutrition).  · Blood samples.  Ports can be placed:  · In the chest area just below the collarbone (this is the most common place.)  · In the arms.  · In the belly (abdomen) area.  · In the legs.  PARTS OF THE PORT  A port has 2 main parts:  · The reservoir. The reservoir is round, disc-shaped, and will be a small, raised area under your skin.  · The reservoir is the part where a needle is inserted (accessed) to either give medicines or to draw blood.  · The catheter. The catheter is a long, slender tube that extends from the reservoir. The catheter is placed into a large vein.  · Medicine that is inserted into the reservoir goes into the catheter and then into the vein.  INSERTION OF THE PORT  · The port is surgically placed in either an operating room or in a procedural area (interventional radiology).  · Medicine may be given to help you relax during the procedure.  · The skin where the port will be inserted is numbed (local anesthetic).  · 1 or 2 small cuts (incisions) will be made in the skin to insert the port.  · The port can be used after it has been inserted.  INCISION SITE CARE  · The incision site may have small adhesive strips on it. This helps keep the incision site closed. Sometimes, no adhesive strips are placed. Instead of adhesive strips, a special kind of surgical glue is used to keep the incision closed.  · If adhesive strips were placed on the incision sites, do not take them off. They will fall off on their own.  · The incision site may be sore for 1 to 2 days. Pain medicine can help.  · Do not get the incision site wet. Bathe or shower as directed by your caregiver.  · The incision site should heal in 5 to 7 days. A small scar may form after the  incision has healed.  ACCESSING THE PORT  Special steps must be taken to access the port:  · Before the port is accessed, a numbing cream can be placed on the skin. This helps numb the skin over the port site.  · A sterile technique is used to access the port.  · The port is accessed with a needle. Only "non-coring" port needles should be used to access the port. Once the port is accessed, a blood return should be checked. This helps ensure the port is in the vein and is not clogged (clotted).  · If your caregiver believes your port should remain accessed, a clear (transparent) bandage will be placed over the needle site. The bandage and needle will need to be changed every week or as directed by your caregiver.  · Keep the bandage covering the needle clean and dry. Do not get it wet. Follow your caregiver's instructions on how to take a shower or bath when the port is accessed.  · If your port does not need to stay accessed, no bandage is needed over the port.  FLUSHING THE PORT  Flushing the port keeps it from getting clogged. How often the port is flushed depends on:  · If a   constant infusion is running. If a constant infusion is running, the port may not need to be flushed.  · If intermittent medicines are given.  · If the port is not being used.  For intermittent medicines:  · The port will need to be flushed:  · After medicines have been given.  · After blood has been drawn.  · As part of routine maintenance.  · A port is normally flushed with:  · Normal saline.  · Heparin.  · Follow your caregiver's advice on how often, how much, and the type of flush to use on your port.  IMPORTANT PORT INFORMATION  · Tell your caregiver if you are allergic to heparin.  · After your port is placed, you will get a manufacturer's information card. The card has information about your port. Keep this card with you at all times.  · There are many types of ports available. Know what kind of port you have.  · In case of an  emergency, it may be helpful to wear a medical alert bracelet. This can help alert health care workers that you have a port.  · The port can stay in for as long as your caregiver believes it is necessary.  · When it is time for the port to come out, surgery will be done to remove it. The surgery will be similar to how the port was put in.  · If you are in the hospital or clinic:  · Your port will be taken care of and flushed by a nurse.  · If you are at home:  · A home health care nurse may give medicines and take care of the port.  · You or a family member can get special training and directions for giving medicine and taking care of the port at home.  SEEK IMMEDIATE MEDICAL CARE IF:   · Your port does not flush or you are unable to get a blood return.  · New drainage or pus is coming from the incision.  · A bad smell is coming from the incision site.  · You develop swelling or increased redness at the incision site.  · You develop increased swelling or pain at the port site.  · You develop swelling or pain in the surrounding skin near the port.  · You have an oral temperature above 102° F (38.9° C), not controlled by medicine.  MAKE SURE YOU:   · Understand these instructions.  · Will watch your condition.  · Will get help right away if you are not doing well or get worse.  Document Released: 10/31/2005 Document Revised: 01/23/2012 Document Reviewed: 01/22/2009  [employer]® Patient Information ©2014 [employer].

## 2013-04-21 ENCOUNTER — Encounter: Payer: Self-pay | Admitting: Family

## 2013-05-09 ENCOUNTER — Encounter: Payer: Self-pay | Admitting: Critical Care Medicine

## 2013-05-09 ENCOUNTER — Ambulatory Visit (INDEPENDENT_AMBULATORY_CARE_PROVIDER_SITE_OTHER): Payer: 59 | Admitting: Critical Care Medicine

## 2013-05-09 ENCOUNTER — Other Ambulatory Visit: Payer: Self-pay | Admitting: *Deleted

## 2013-05-09 VITALS — BP 118/80 | HR 80 | Temp 98.5°F | Ht 69.0 in | Wt 154.0 lb

## 2013-05-09 DIAGNOSIS — R911 Solitary pulmonary nodule: Secondary | ICD-10-CM

## 2013-05-09 DIAGNOSIS — J438 Other emphysema: Secondary | ICD-10-CM

## 2013-05-09 DIAGNOSIS — J439 Emphysema, unspecified: Secondary | ICD-10-CM

## 2013-05-09 NOTE — Progress Notes (Signed)
Subjective:    Patient ID: Teresa Morgan, female    DOB: 10-Jul-1956, 57 y.o.   MRN: [id number]  HPI   Dx colon (cecal)  T3N2a  CA 05/2011.  R side near appx.  Pt had surgery.  Post op Chemorx.  +LN involved. Not on chemorx , last Rx march 2013.   Pt notes dyspnea since smoker and quit 6 yrs ago  05/09/2013 Chief Complaint  Patient presents with  . 6 month follow up    Breathing is a little better since last OV.  Does DOE.  No wheezing, chest tightness, chest pain, or cough at this time.  Pt on Breo daily Pt still dyspneic with exertion.  No cough      CAT Score 05/09/2013 09/27/2012  Total CAT Score 8 11       Past Medical History  Diagnosis Date  . Anemia   . Anxiety   . Arthritis   . Depression   . GERD (gastroesophageal reflux disease)   . Ulcer   . Colon cancer   . COPD with emphysema 06/18/2012     Family History  Problem Relation Age of Onset  . Diabetes Mother   . Colon cancer Cousin      History   Social History  . Marital Status: Married    Spouse Name: N/A    Number of Children: 3  . 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 Smoking status: Former Smoker -- 1.00 packs/day for 25 years    Types: Cigarettes    Quit date: 06/08/2005  . Smokeless tobacco: Never Used  . Alcohol Use: 0.6 oz/week    1 Glasses of wine per week     Comment: social  . Drug Use: No  . Sexually Active: Not Currently    Birth Control/ Protection: Surgical   Other Topics Concern  . 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 Aspirin     Upset stomach  . Cymbalta (Duloxetine Hcl) Swelling    Swollen lips   . Prozac (Fluoxetine Hcl) Swelling    Lip swelling  . Wellbutrin (Bupropion) Swelling    Lip swelling     Outpatient Prescriptions Prior to Visit  Medication  Sig Dispense Refill  . ALPRAZolam (XANAX) 1 MG tablet Take 1 tablet (1 mg total) by mouth every 6 (six) hours as needed.  90 tablet  3  . B Complex-C (B-COMPLEX WITH VITAMIN C) tablet Take 1 tablet by mouth daily.        . Calcium-Vitamin D 600-125 MG-UNIT TABS Take by mouth daily.        . Fluticasone Furoate-Vilanterol (BREO ELLIPTA) 100-25 MCG/INH AEPB Inhale 1 puff into the lungs daily.  30 each  6  . gabapentin (NEURONTIN) 600 MG tablet Take 1 tablet (600 mg total) by mouth 4 (four) times daily.  120 tablet  3  . lansoprazole (PREVACID) 30 MG capsule Take 30 mg by mouth daily as needed.      . lidocaine-prilocaine (EMLA) cream APPLY TO PORT SITE 1 HOUR PRIOR TO CHEMO  30 g  4  . Multiple Vitamin (MULTIVITAMIN) tablet Take 1 tablet by mouth daily.        Marland Kitchen  oxyCODONE-acetaminophen (PERCOCET) 10-325 MG per tablet 1 - 2 tabs every 4 hours as needed for pain  120 tablet  0  . TURMERIC PO Take by mouth daily.       . vitamin E 200 UNIT capsule Take 200 Units by mouth daily.        . Vortioxetine HBr 20 MG TABS Take 0.5 tablets by mouth daily.      Marland Kitchen oxyCODONE-acetaminophen (PERCOCET) 10-325 MG per tablet 1 - 2 tabs every 4 hours as needed for pain       No facility-administered medications prior to visit.     Review of Systems  Constitutional: Negative for chills, diaphoresis, activity change, appetite change, fatigue and unexpected weight change.  HENT: Negative for hearing loss, nosebleeds, congestion, facial swelling, sneezing, mouth sores, trouble swallowing, neck stiffness, dental problem, voice change, postnasal drip, sinus pressure, tinnitus and ear discharge.   Eyes: Negative for photophobia, discharge, itching and visual disturbance.  Respiratory: Negative for apnea, cough, choking, chest tightness and stridor.   Cardiovascular: Negative for palpitations.  Gastrointestinal: Positive for constipation. Negative for nausea, blood in stool and abdominal distention.  Genitourinary:  Negative for dysuria, urgency, frequency, hematuria, flank pain, decreased urine volume and difficulty urinating.  Musculoskeletal: Positive for back pain. Negative for myalgias, joint swelling, arthralgias and gait problem.  Skin: Negative for color change and pallor.  Neurological: Negative for dizziness, tremors, seizures, syncope, speech difficulty, weakness, light-headedness and numbness.  Hematological: Negative for adenopathy. Bruises/bleeds easily.  Psychiatric/Behavioral: Negative for confusion, sleep disturbance and agitation. The patient is nervous/anxious.        Objective:   Physical Exam  Filed Vitals:   05/09/13 1348  BP: 118/80  Pulse: 80  Temp: 98.5 F (36.9 C)  TempSrc: Oral  Height: 5\' 9"  (1.753 m)  Weight: 154 lb (69.854 kg)  SpO2: 99%    Gen: Pleasant, well-nourished, in no distress,  normal affect  ENT: No lesions,  mouth clear,  oropharynx clear, no postnasal drip  Neck: No JVD, no TMG, no carotid bruits  Lungs: No use of accessory muscles, no dullness to percussion,distant BS  Cardiovascular: RRR, heart sounds normal, no murmur or gallops, no peripheral edema  Abdomen: soft and NT, no HSM,  BS normal  Musculoskeletal: No deformities, no cyanosis or clubbing  Neuro: alert, non focal  Skin: Warm, no lesions or rashes           Assessment & Plan:   COPD with emphysema, Gold B Copd Gold B Positive response to [facility] Stay on Breo one puff daily  Lung nodule seen on imaging study RUL lung nodule, ?etiology F/u Ct Scan in 43month    Updated Medication List Outpatient Encounter Prescriptions as of 05/09/2013  Medication Sig Dispense Refill  . ALPRAZolam (XANAX) 1 MG tablet Take 1 tablet (1 mg total) by mouth every 6 (six) hours as needed.  90 tablet  3  . B Complex-C (B-COMPLEX WITH VITAMIN C) tablet Take 1 tablet by mouth daily.        . Calcium-Vitamin D 600-125 MG-UNIT TABS Take by mouth daily.        . Fluticasone  Furoate-Vilanterol (BREO ELLIPTA) 100-25 MCG/INH AEPB Inhale 1 puff into the lungs daily.  30 each  6  . gabapentin (NEURONTIN) 600 MG tablet Take 1 tablet (600 mg total) by mouth 4 (four) times daily.  120 tablet  3  . lansoprazole (PREVACID) 30 MG capsule Take 30 mg by mouth daily as needed.      Marland Kitchen  lidocaine-prilocaine (EMLA) cream APPLY TO PORT SITE 1 HOUR PRIOR TO CHEMO  30 g  4  . Multiple Vitamin (MULTIVITAMIN) tablet Take 1 tablet by mouth daily.        Marland Kitchen oxyCODONE-acetaminophen (PERCOCET) 10-325 MG per tablet 1 - 2 tabs every 4 hours as needed for pain  120 tablet  0  . TURMERIC PO Take by mouth daily.       . vitamin E 200 UNIT capsule Take 200 Units by mouth daily.        . Vortioxetine HBr 20 MG TABS Take 0.5 tablets by mouth daily.      . [DISCONTINUED] oxyCODONE-acetaminophen (PERCOCET) 10-325 MG per tablet 1 - 2 tabs every 4 hours as needed for pain       No facility-administered encounter medications on file as of 05/09/2013.

## 2013-05-09 NOTE — Patient Instructions (Addendum)
Stay on Breo Keep CT appts  Return 6 months

## 2013-05-09 NOTE — Telephone Encounter (Signed)
Pt requested a Percocet 10/325 mg refill. She last received #120 on 04/17/13. Reviewed rx history. She normally gets a rx every 6 weeks or so. Asked how often she takes it and she said "one in the morning and one at night". Confirmed with the pt that it was 2 tabs total per day. Next asked her if she received her whole rx and she wasn't sure. Called [facility] and [facility] stated she did receive 120 tabs on 6/4. Reviewed with Dr Myna Hidalgo. He is willing to refill it next week on 05/16/13. Pt stated that she is wondering if someone from work is taking her medication. Informed her that she is responsible for her pain meds and will need to keep them in a safe place where others can't get to them. She verbalized understanding and will come back next week.

## 2013-05-10 DIAGNOSIS — R911 Solitary pulmonary nodule: Secondary | ICD-10-CM | POA: Insufficient documentation

## 2013-05-10 NOTE — Assessment & Plan Note (Signed)
RUL lung nodule, ?etiology F/u Ct Scan in 52month

## 2013-05-10 NOTE — Assessment & Plan Note (Signed)
Copd Gold B Positive response to [facility] Stay on Breo one puff daily

## 2013-05-15 ENCOUNTER — Encounter (HOSPITAL_COMMUNITY): Payer: Self-pay | Admitting: *Deleted

## 2013-05-15 ENCOUNTER — Emergency Department (HOSPITAL_COMMUNITY)
Admission: EM | Admit: 2013-05-15 | Discharge: 2013-05-15 | Disposition: A | Discharge status: Home / Self Care | Payer: Worker's Compensation | Attending: Emergency Medicine | Admitting: Emergency Medicine

## 2013-05-15 DIAGNOSIS — Y9269 Other specified industrial and construction area as the place of occurrence of the external cause: Secondary | ICD-10-CM | POA: Insufficient documentation

## 2013-05-15 DIAGNOSIS — J029 Acute pharyngitis, unspecified: Secondary | ICD-10-CM | POA: Insufficient documentation

## 2013-05-15 DIAGNOSIS — Y9389 Activity, other specified: Secondary | ICD-10-CM | POA: Insufficient documentation

## 2013-05-15 DIAGNOSIS — Z87891 Personal history of nicotine dependence: Secondary | ICD-10-CM | POA: Insufficient documentation

## 2013-05-15 DIAGNOSIS — Z862 Personal history of diseases of the blood and blood-forming organs and certain disorders involving the immune mechanism: Secondary | ICD-10-CM | POA: Insufficient documentation

## 2013-05-15 DIAGNOSIS — Z8739 Personal history of other diseases of the musculoskeletal system and connective tissue: Secondary | ICD-10-CM | POA: Insufficient documentation

## 2013-05-15 DIAGNOSIS — F329 Major depressive disorder, single episode, unspecified: Secondary | ICD-10-CM | POA: Insufficient documentation

## 2013-05-15 DIAGNOSIS — J4489 Other specified chronic obstructive pulmonary disease: Secondary | ICD-10-CM | POA: Insufficient documentation

## 2013-05-15 DIAGNOSIS — K219 Gastro-esophageal reflux disease without esophagitis: Secondary | ICD-10-CM | POA: Insufficient documentation

## 2013-05-15 DIAGNOSIS — T535X1A Toxic effect of chlorofluorocarbons, accidental (unintentional), initial encounter: Secondary | ICD-10-CM | POA: Insufficient documentation

## 2013-05-15 DIAGNOSIS — Y990 Civilian activity done for income or pay: Secondary | ICD-10-CM | POA: Insufficient documentation

## 2013-05-15 DIAGNOSIS — J449 Chronic obstructive pulmonary disease, unspecified: Secondary | ICD-10-CM | POA: Insufficient documentation

## 2013-05-15 DIAGNOSIS — Z79899 Other long term (current) drug therapy: Secondary | ICD-10-CM | POA: Insufficient documentation

## 2013-05-15 DIAGNOSIS — Z85038 Personal history of other malignant neoplasm of large intestine: Secondary | ICD-10-CM | POA: Insufficient documentation

## 2013-05-15 DIAGNOSIS — F3289 Other specified depressive episodes: Secondary | ICD-10-CM | POA: Insufficient documentation

## 2013-05-15 DIAGNOSIS — F411 Generalized anxiety disorder: Secondary | ICD-10-CM | POA: Insufficient documentation

## 2013-05-15 DIAGNOSIS — Z8711 Personal history of peptic ulcer disease: Secondary | ICD-10-CM | POA: Insufficient documentation

## 2013-05-15 NOTE — ED Provider Notes (Signed)
Medical screening examination/treatment/procedure(s) were performed by non-physician practitioner and as supervising physician I was immediately available for consultation/collaboration.  Olivia Mackie, MD 05/15/13 [phone]

## 2013-05-15 NOTE — ED Notes (Signed)
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

## 2013-05-15 NOTE — ED Provider Notes (Signed)
History    CSN: [id number] Arrival date & time 05/15/13  0203  First MD Initiated Contact with Patient 05/15/13 0236     Chief Complaint  Patient presents with  . freon inhalation    (Consider location/radiation/quality/duration/timing/severity/associated sxs/prior Treatment) HPI Comments: While working at the [employer].  A box opened with 6 canisters of Freon.  One of which exploded.  Patient and her coworkers had a brief exposure to free a gas.  They were immediately removed from the environment.  Patient continued her shift and 4 hours later noticed, that she had some burning sensation in her throat.  She denies any shortness of breath.  She does have a history of COPD  The history is provided by the patient.   Past Medical History  Diagnosis Date  . Anemia   . Anxiety   . Arthritis   . Depression   . GERD (gastroesophageal reflux disease)   . Ulcer   . Colon cancer   . COPD with emphysema 06/18/2012   Past Surgical History  Procedure Laterality Date  . Heel spurs    . Hemrrhoids    . Plantar fascia surgery  2009  . Colon resection  2012    for colon cancer  . Colon surgery  07/07/11    lap ileocolectomy  . Tubal ligation  07/1980  . Appendectomy  07/07/11   Family History  Problem Relation Age of Onset  . Diabetes Mother   . Colon cancer Cousin    History  Substance Use Topics  . Smoking status: Former Smoker -- 1.00 packs/day for 25 years    Types: Cigarettes    Quit date: 06/08/2005  . Smokeless tobacco: Never Used  . Alcohol Use: 0.6 oz/week    1 Glasses of wine per week     Comment: social   OB History   Grav Para Term Preterm Abortions TAB SAB Ect Mult Living                 Review of Systems  Constitutional: Negative for fever.  HENT: Positive for sore throat. Negative for mouth sores and trouble swallowing.   Respiratory: Negative for cough and shortness of breath.   Cardiovascular: Negative for chest pain.  Gastrointestinal: Negative for  nausea.  Neurological: Negative for dizziness, light-headedness and headaches.  All other systems reviewed and are negative.    Allergies  Aspirin; Cymbalta; Prozac; and Wellbutrin  Home Medications   Current Outpatient Rx  Name  Route  Sig  Dispense  Refill  . ALPRAZolam (XANAX) 1 MG tablet   Oral   Take 1 tablet (1 mg total) by mouth every 6 (six) hours as needed.   90 tablet   3   . B Complex-C (B-COMPLEX WITH VITAMIN C) tablet   Oral   Take 1 tablet by mouth daily.           . Calcium-Vitamin D 600-125 MG-UNIT TABS   Oral   Take by mouth daily.           . Fluticasone Furoate-Vilanterol (BREO ELLIPTA) 100-25 MCG/INH AEPB   Inhalation   Inhale 1 puff into the lungs daily.   30 each   6   . gabapentin (NEURONTIN) 600 MG tablet   Oral   Take 1 tablet (600 mg total) by mouth 4 (four) times daily.   120 tablet   3   . lansoprazole (PREVACID) 30 MG capsule   Oral   Take 30 mg by mouth  daily as needed.         . lidocaine-prilocaine (EMLA) cream      APPLY TO PORT SITE 1 HOUR PRIOR TO CHEMO   30 g   4   . Multiple Vitamin (MULTIVITAMIN) tablet   Oral   Take 1 tablet by mouth daily.           Marland Kitchen oxyCODONE-acetaminophen (PERCOCET) 10-325 MG per tablet      1 - 2 tabs every 4 hours as needed for pain   120 tablet   0   . TURMERIC PO   Oral   Take by mouth daily.          . vitamin E 200 UNIT capsule   Oral   Take 200 Units by mouth daily.           . Vortioxetine HBr 20 MG TABS   Oral   Take 0.5 tablets by mouth daily.          BP 154/90  Pulse 66  Temp(Src) 98.2 F (36.8 C) (Oral)  Resp 18  Ht 5\' 9"  (1.753 m)  SpO2 95% Physical Exam  Nursing note and vitals reviewed. Constitutional: She is oriented to person, place, and time. She appears well-developed and well-nourished.  HENT:  Head: Normocephalic.  Mouth/Throat: Oropharyngeal exudate present.  Eyes: Pupils are equal, round, and reactive to light.  Neck: Normal range of  motion.  Cardiovascular: Normal rate and regular rhythm.   Pulmonary/Chest: Effort normal and breath sounds normal. No respiratory distress.  Abdominal: Soft.  Musculoskeletal: Normal range of motion. She exhibits no edema.  Lymphadenopathy:    She has no cervical adenopathy.  Neurological: She is oriented to person, place, and time.  Skin: Skin is warm and dry.    ED Course  Procedures (including critical care time) Labs Reviewed - No data to display No results found. 1. Toxic effect of freon, initial encounter     MDM  Patient had a brief exposure to Freon that exploded.  The cancer itself, said environmentally safe.  Several hours after exposure.  She complains of a burning sensation in her throat.  No shortness of breath.  She's been encouraged to follow up with her primary care physician as needed   Arman Filter, NP 05/15/13 1478  Arman Filter, NP 05/15/13 [phone]

## 2013-05-20 ENCOUNTER — Other Ambulatory Visit: Payer: Self-pay | Admitting: *Deleted

## 2013-05-20 DIAGNOSIS — G629 Polyneuropathy, unspecified: Secondary | ICD-10-CM

## 2013-05-20 DIAGNOSIS — C180 Malignant neoplasm of cecum: Secondary | ICD-10-CM

## 2013-05-20 MED ORDER — OXYCODONE-ACETAMINOPHEN 10-325 MG PO TABS: ORAL_TABLET | ORAL | Status: DC

## 2013-05-20 NOTE — Telephone Encounter (Signed)
Pt called to get her Oxycodone refilled. Will route to Dr Myna Hidalgo to approval and signature then place at the front desk for pick up.

## 2013-05-29 ENCOUNTER — Ambulatory Visit (HOSPITAL_BASED_OUTPATIENT_CLINIC_OR_DEPARTMENT_OTHER): Payer: 59

## 2013-05-29 VITALS — BP 137/81 | HR 60 | Temp 98.3°F | Resp 20

## 2013-05-29 DIAGNOSIS — Z95828 Presence of other vascular implants and grafts: Secondary | ICD-10-CM

## 2013-05-29 DIAGNOSIS — Z452 Encounter for adjustment and management of vascular access device: Secondary | ICD-10-CM

## 2013-05-29 DIAGNOSIS — C180 Malignant neoplasm of cecum: Secondary | ICD-10-CM

## 2013-05-29 MED ORDER — HEPARIN SOD (PORK) LOCK FLUSH 100 UNIT/ML IV SOLN
500.0000 [IU] | Freq: Once | INTRAVENOUS | Status: AC
  Administered 2013-05-29: 500 [IU] via INTRAVENOUS
  Filled 2013-05-29: qty 5

## 2013-05-29 MED ORDER — SODIUM CHLORIDE 0.9 % IJ SOLN
10.0000 mL | INTRAMUSCULAR | Status: DC | PRN
  Administered 2013-05-29: 10 mL via INTRAVENOUS
  Filled 2013-05-29: qty 10

## 2013-05-29 NOTE — Patient Instructions (Signed)
Implanted Port Instructions  An implanted port is a central line that has a round shape and is placed under the skin. It is used for long-term IV (intravenous) access for:  · Medicine.  · Fluids.  · Liquid nutrition, such as TPN (total parenteral nutrition).  · Blood samples.  Ports can be placed:  · In the chest area just below the collarbone (this is the most common place.)  · In the arms.  · In the belly (abdomen) area.  · In the legs.  PARTS OF THE PORT  A port has 2 main parts:  · The reservoir. The reservoir is round, disc-shaped, and will be a small, raised area under your skin.  · The reservoir is the part where a needle is inserted (accessed) to either give medicines or to draw blood.  · The catheter. The catheter is a long, slender tube that extends from the reservoir. The catheter is placed into a large vein.  · Medicine that is inserted into the reservoir goes into the catheter and then into the vein.  INSERTION OF THE PORT  · The port is surgically placed in either an operating room or in a procedural area (interventional radiology).  · Medicine may be given to help you relax during the procedure.  · The skin where the port will be inserted is numbed (local anesthetic).  · 1 or 2 small cuts (incisions) will be made in the skin to insert the port.  · The port can be used after it has been inserted.  INCISION SITE CARE  · The incision site may have small adhesive strips on it. This helps keep the incision site closed. Sometimes, no adhesive strips are placed. Instead of adhesive strips, a special kind of surgical glue is used to keep the incision closed.  · If adhesive strips were placed on the incision sites, do not take them off. They will fall off on their own.  · The incision site may be sore for 1 to 2 days. Pain medicine can help.  · Do not get the incision site wet. Bathe or shower as directed by your caregiver.  · The incision site should heal in 5 to 7 days. A small scar may form after the  incision has healed.  ACCESSING THE PORT  Special steps must be taken to access the port:  · Before the port is accessed, a numbing cream can be placed on the skin. This helps numb the skin over the port site.  · A sterile technique is used to access the port.  · The port is accessed with a needle. Only "non-coring" port needles should be used to access the port. Once the port is accessed, a blood return should be checked. This helps ensure the port is in the vein and is not clogged (clotted).  · If your caregiver believes your port should remain accessed, a clear (transparent) bandage will be placed over the needle site. The bandage and needle will need to be changed every week or as directed by your caregiver.  · Keep the bandage covering the needle clean and dry. Do not get it wet. Follow your caregiver's instructions on how to take a shower or bath when the port is accessed.  · If your port does not need to stay accessed, no bandage is needed over the port.  FLUSHING THE PORT  Flushing the port keeps it from getting clogged. How often the port is flushed depends on:  · If a   constant infusion is running. If a constant infusion is running, the port may not need to be flushed.  · If intermittent medicines are given.  · If the port is not being used.  For intermittent medicines:  · The port will need to be flushed:  · After medicines have been given.  · After blood has been drawn.  · As part of routine maintenance.  · A port is normally flushed with:  · Normal saline.  · Heparin.  · Follow your caregiver's advice on how often, how much, and the type of flush to use on your port.  IMPORTANT PORT INFORMATION  · Tell your caregiver if you are allergic to heparin.  · After your port is placed, you will get a manufacturer's information card. The card has information about your port. Keep this card with you at all times.  · There are many types of ports available. Know what kind of port you have.  · In case of an  emergency, it may be helpful to wear a medical alert bracelet. This can help alert health care workers that you have a port.  · The port can stay in for as long as your caregiver believes it is necessary.  · When it is time for the port to come out, surgery will be done to remove it. The surgery will be similar to how the port was put in.  · If you are in the hospital or clinic:  · Your port will be taken care of and flushed by a nurse.  · If you are at home:  · A home health care nurse may give medicines and take care of the port.  · You or a family member can get special training and directions for giving medicine and taking care of the port at home.  SEEK IMMEDIATE MEDICAL CARE IF:   · Your port does not flush or you are unable to get a blood return.  · New drainage or pus is coming from the incision.  · A bad smell is coming from the incision site.  · You develop swelling or increased redness at the incision site.  · You develop increased swelling or pain at the port site.  · You develop swelling or pain in the surrounding skin near the port.  · You have an oral temperature above 102° F (38.9° C), not controlled by medicine.  MAKE SURE YOU:   · Understand these instructions.  · Will watch your condition.  · Will get help right away if you are not doing well or get worse.  Document Released: 10/31/2005 Document Revised: 01/23/2012 Document Reviewed: 01/22/2009  [employer]® Patient Information ©2014 [employer].

## 2013-05-30 ENCOUNTER — Encounter: Payer: Self-pay | Admitting: *Deleted

## 2013-05-30 ENCOUNTER — Ambulatory Visit (INDEPENDENT_AMBULATORY_CARE_PROVIDER_SITE_OTHER): Payer: Worker's Compensation | Admitting: Critical Care Medicine

## 2013-05-30 ENCOUNTER — Encounter: Payer: Self-pay | Admitting: Critical Care Medicine

## 2013-05-30 VITALS — BP 108/66 | HR 76 | Temp 97.9°F | Ht 69.0 in | Wt 155.0 lb

## 2013-05-30 DIAGNOSIS — J439 Emphysema, unspecified: Secondary | ICD-10-CM

## 2013-05-30 DIAGNOSIS — J438 Other emphysema: Secondary | ICD-10-CM

## 2013-05-30 NOTE — Patient Instructions (Signed)
No evidence for any lung toxicity with the freon exposure No change in medications Return 6 months

## 2013-05-30 NOTE — Assessment & Plan Note (Signed)
Gold B Copd Recent freon exposure with no evidence for lung damage Plan Maintain breo daily Note lung function improved reassurance

## 2013-05-30 NOTE — Progress Notes (Signed)
Subjective:    Patient ID: Teresa Morgan, female    DOB: 1956/08/08, 57 y.o.   MRN: [id number]  HPI  05/30/2013 Chief Complaint  Patient presents with  . Follow up    Can of freon exploded while at work on July 1.  Inhaled some of these fumes.  Is having more trouble breathing with the humidity but otherwise no change.  Reports dry cough.  No wheezing, chest tightness, or chest pain.     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 Can on top of the belt of package delivery area and strong odor.  Since then notes with humidity cannot breath .  Notes more coughing. No real wheezing.  No use of breo to excess   Past Medical History  Diagnosis Date  . Anemia   . Anxiety   . Arthritis   . Depression   . GERD (gastroesophageal reflux disease)   . Ulcer   . Colon cancer   . COPD with emphysema 06/18/2012     Family History  Problem Relation Age of Onset  . Diabetes Mother   . Colon cancer Cousin      History   Social History  . Marital Status: Married    Spouse Name: N/A    Number of Children: 3  . 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 Smoking status: Former Smoker -- 1.00 packs/day for 25 years    Types: Cigarettes    Quit date: 06/08/2005  . Smokeless tobacco: Never Used  . Alcohol Use: 0.6 oz/week    1 Glasses of wine per week     Comment: social  . Drug Use: No  . Sexually Active: Not Currently    Birth Control/ Protection: Surgical   Other Topics Concern  . 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 Aspirin     Upset stomach  . Cymbalta (Duloxetine Hcl) Swelling    Swollen lips   . Prozac (Fluoxetine Hcl) Swelling    Lip swelling  . Wellbutrin (Bupropion) Swelling    Lip swelling     Outpatient Prescriptions Prior to  Visit  Medication Sig Dispense Refill  . ALPRAZolam (XANAX) 1 MG tablet Take 1 tablet (1 mg total) by mouth every 6 (six) hours as needed.  90 tablet  3  . B Complex-C (B-COMPLEX WITH VITAMIN C) tablet Take 1 tablet by mouth daily.        . Calcium-Vitamin D 600-125 MG-UNIT TABS Take by mouth daily.        . Fluticasone Furoate-Vilanterol (BREO ELLIPTA) 100-25 MCG/INH AEPB Inhale 1 puff into the lungs daily.  30 each  6  . gabapentin (NEURONTIN) 600 MG tablet Take 1 tablet (600 mg total) by mouth 4 (four) times daily.  120 tablet  3  . lansoprazole (PREVACID) 30 MG capsule Take 30 mg by mouth daily as needed.      . lidocaine-prilocaine (EMLA) cream APPLY TO PORT SITE 1 HOUR PRIOR TO CHEMO  30 g  4  . Multiple Vitamin (MULTIVITAMIN) tablet Take 1 tablet by mouth daily.        Marland Kitchen oxyCODONE-acetaminophen (PERCOCET) 10-325 MG per tablet 1 -  2 tabs every 4 hours as needed for pain  120 tablet  0  . TURMERIC PO Take by mouth daily.       . vitamin E 200 UNIT capsule Take 200 Units by mouth daily.        . Vortioxetine HBr 20 MG TABS Take 0.5 tablets by mouth daily.       No facility-administered medications prior to visit.     Review of Systems  Constitutional: Negative for chills, diaphoresis, activity change, appetite change, fatigue and unexpected weight change.  HENT: Negative for hearing loss, nosebleeds, congestion, facial swelling, sneezing, mouth sores, trouble swallowing, neck stiffness, dental problem, voice change, postnasal drip, sinus pressure, tinnitus and ear discharge.   Eyes: Negative for photophobia, discharge, itching and visual disturbance.  Respiratory: Negative for apnea, cough, choking, chest tightness and stridor.   Cardiovascular: Negative for palpitations.  Gastrointestinal: Positive for constipation. Negative for nausea, blood in stool and abdominal distention.  Genitourinary: Negative for dysuria, urgency, frequency, hematuria, flank pain, decreased urine volume and  difficulty urinating.  Musculoskeletal: Positive for back pain. Negative for myalgias, joint swelling, arthralgias and gait problem.  Skin: Negative for color change and pallor.  Neurological: Negative for dizziness, tremors, seizures, syncope, speech difficulty, weakness, light-headedness and numbness.  Hematological: Negative for adenopathy. Bruises/bleeds easily.  Psychiatric/Behavioral: Negative for confusion, sleep disturbance and agitation. The patient is nervous/anxious.        Objective:   Physical Exam  Filed Vitals:   05/30/13 1547  BP: 108/66  Pulse: 76  Temp: 97.9 F (36.6 C)  TempSrc: Oral  Height: 5\' 9"  (1.753 m)  Weight: 155 lb (70.308 kg)  SpO2: 98%    Gen: Pleasant, well-nourished, in no distress,  normal affect  ENT: No lesions,  mouth clear,  oropharynx clear, no postnasal drip  Neck: No JVD, no TMG, no carotid bruits  Lungs: No use of accessory muscles, no dullness to percussion,distant BS  Cardiovascular: RRR, heart sounds normal, no murmur or gallops, no peripheral edema  Abdomen: soft and NT, no HSM,  BS normal  Musculoskeletal: No deformities, no cyanosis or clubbing  Neuro: alert, non focal  Skin: Warm, no lesions or rashes     Assessment & Plan:   COPD with emphysema, Gold B Gold B Copd Recent freon exposure with no evidence for lung damage Plan Maintain breo daily Note lung function improved reassurance    Updated Medication List Outpatient Encounter Prescriptions as of 05/30/2013  Medication Sig Dispense Refill  . ALPRAZolam (XANAX) 1 MG tablet Take 1 tablet (1 mg total) by mouth every 6 (six) hours as needed.  90 tablet  3  . B Complex-C (B-COMPLEX WITH VITAMIN C) tablet Take 1 tablet by mouth daily.        . Calcium-Vitamin D 600-125 MG-UNIT TABS Take by mouth daily.        . Fluticasone Furoate-Vilanterol (BREO ELLIPTA) 100-25 MCG/INH AEPB Inhale 1 puff into the lungs daily.  30 each  6  . gabapentin (NEURONTIN) 600 MG tablet  Take 1 tablet (600 mg total) by mouth 4 (four) times daily.  120 tablet  3  . lansoprazole (PREVACID) 30 MG capsule Take 30 mg by mouth daily as needed.      . lidocaine-prilocaine (EMLA) cream APPLY TO PORT SITE 1 HOUR PRIOR TO CHEMO  30 g  4  . Multiple Vitamin (MULTIVITAMIN) tablet Take 1 tablet by mouth daily.        Marland Kitchen oxyCODONE-acetaminophen (PERCOCET) 10-325 MG  per tablet 1 - 2 tabs every 4 hours as needed for pain  120 tablet  0  . TURMERIC PO Take by mouth daily.       . vitamin E 200 UNIT capsule Take 200 Units by mouth daily.        . Vortioxetine HBr 20 MG TABS Take 0.5 tablets by mouth daily.       No facility-administered encounter medications on file as of 05/30/2013.

## 2013-06-27 ENCOUNTER — Other Ambulatory Visit: Payer: Self-pay | Admitting: *Deleted

## 2013-06-27 DIAGNOSIS — C180 Malignant neoplasm of cecum: Secondary | ICD-10-CM

## 2013-06-27 DIAGNOSIS — G629 Polyneuropathy, unspecified: Secondary | ICD-10-CM

## 2013-06-27 MED ORDER — OXYCODONE-ACETAMINOPHEN 10-325 MG PO TABS: ORAL_TABLET | ORAL | Status: DC

## 2013-07-01 ENCOUNTER — Encounter: Payer: Self-pay | Admitting: Family

## 2013-07-01 ENCOUNTER — Ambulatory Visit (INDEPENDENT_AMBULATORY_CARE_PROVIDER_SITE_OTHER): Payer: 59 | Admitting: Family

## 2013-07-01 VITALS — BP 130/80 | HR 68 | Temp 98.3°F | Resp 16 | Ht 67.5 in | Wt 155.0 lb

## 2013-07-01 DIAGNOSIS — R109 Unspecified abdominal pain: Secondary | ICD-10-CM

## 2013-07-01 DIAGNOSIS — F411 Generalized anxiety disorder: Secondary | ICD-10-CM

## 2013-07-01 DIAGNOSIS — R1013 Epigastric pain: Secondary | ICD-10-CM

## 2013-07-01 DIAGNOSIS — R0781 Pleurodynia: Secondary | ICD-10-CM

## 2013-07-01 DIAGNOSIS — R079 Chest pain, unspecified: Secondary | ICD-10-CM

## 2013-07-01 LAB — CBC WITH DIFFERENTIAL/PLATELET
Basophils Absolute: 0 10*3/uL (ref 0.0–0.1)
Basophils Relative: 0 % (ref 0–1)
Eosinophils Absolute: 0.1 10*3/uL (ref 0.0–0.7)
Eosinophils Relative: 1 % (ref 0–5)
HCT: 39.7 % (ref 36.0–46.0)
MCHC: 31.7 g/dL (ref 30.0–36.0)
Monocytes Absolute: 0.3 10*3/uL (ref 0.1–1.0)
Neutro Abs: 3.7 10*3/uL (ref 1.7–7.7)
RDW: 15.8 % — ABNORMAL HIGH (ref 11.5–15.5)

## 2013-07-01 LAB — POCT URINALYSIS DIPSTICK
Ketones, UA: NEGATIVE
Protein, UA: NEGATIVE
Spec Grav, UA: 1.01
pH, UA: 8

## 2013-07-01 LAB — HEPATIC FUNCTION PANEL
AST: 16 U/L (ref 0–37)
Albumin: 4.4 g/dL (ref 3.5–5.2)
Alkaline Phosphatase: 70 U/L (ref 39–117)
Total Bilirubin: 0.3 mg/dL (ref 0.3–1.2)
Total Protein: 6.8 g/dL (ref 6.0–8.3)

## 2013-07-01 MED ORDER — ZOLPIDEM TARTRATE 5 MG PO TABS: 5.0000 mg | ORAL_TABLET | Freq: Every evening | ORAL | Status: DC | PRN

## 2013-07-01 MED ORDER — LANSOPRAZOLE 30 MG PO CPDR: 30.0000 mg | DELAYED_RELEASE_CAPSULE | Freq: Every day | ORAL | Status: DC | PRN

## 2013-07-01 MED ORDER — LORAZEPAM 0.5 MG PO TABS: 0.5000 mg | ORAL_TABLET | Freq: Two times a day (BID) | ORAL | Status: DC | PRN

## 2013-07-01 MED ORDER — VORTIOXETINE HBR 5 MG PO TABS: 1.0000 | ORAL_TABLET | Freq: Every day | ORAL | Status: DC

## 2013-07-01 NOTE — Progress Notes (Signed)
Subjective:    Patient ID: Teresa Morgan, female    DOB: 1955-12-28, 57 y.o.   MRN: [id number]  HPI  Teresa Morgan is a 57 yr old female who presents today for follow up. She also has some new concerns.    1) Depression-  She continues brintellix. She reports that her depression is improved.  She reports that she has stopped taking the medication for a few days and notes that her stomach pain improved. Currently tolerating brintellix at the 5mg  dose.      2) Flank pain-She reports that she has had right sided flank pain x 1 month. This pain is noted to be worse with movement. Reports that the pain feels like a bruised rib.   3) abdominal pain- present at night and in the AM x 1 month.  She reports that her abdominal pain is located in the epigastrium.  Denies hematuria, fever, vomitting.      Review of Systems  Past Medical History  Diagnosis Date  . Anemia   . Anxiety   . Arthritis   . Depression   . GERD (gastroesophageal reflux disease)   . Ulcer   . Colon cancer   . COPD with emphysema 06/18/2012    History   Social History  . Marital Status: Married    Spouse Name: N/A    Number of Children: 3  . 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 Smoking status: Former Smoker -- 1.00 packs/day for 25 years    Types: Cigarettes    Quit date: 06/08/2005  . Smokeless tobacco: Never Used  . Alcohol Use: 0.6 oz/week    1 Glasses of wine per week     Comment: social  . Drug Use: No  . Sexual Activity: Not Currently    Birth Control/ Protection: Surgical   Other Topics Concern  . 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 Heel spurs    . Hemrrhoids    . Plantar fascia surgery  2009  . Colon resection  2012    for colon cancer  . Colon surgery  07/07/11   lap ileocolectomy  . Tubal ligation  07/1980  . Appendectomy  07/07/11    Family History  Problem Relation Age of Onset  . Diabetes Mother   . Colon cancer Cousin     Allergies  Allergen Reactions  . Aspirin     Upset stomach  . Cymbalta [Duloxetine Hcl] Swelling    Swollen lips   . Prozac [Fluoxetine Hcl] Swelling    Lip swelling  . Wellbutrin [Bupropion] Swelling    Lip swelling    Current Outpatient Prescriptions on File Prior to Visit  Medication Sig Dispense Refill  . ALPRAZolam (XANAX) 1 MG tablet Take 1 tablet (1 mg total) by mouth every 6 (six) hours as needed.  90 tablet  3  . B Complex-C (B-COMPLEX WITH VITAMIN C) tablet Take 1 tablet by mouth daily.        . Calcium-Vitamin D 600-125 MG-UNIT TABS Take by mouth daily.        . Fluticasone Furoate-Vilanterol (BREO ELLIPTA) 100-25 MCG/INH AEPB Inhale 1 puff into the lungs daily.  30 each  6  . gabapentin (NEURONTIN) 600 MG tablet Take 1 tablet (600 mg total) by mouth 4 (four) times daily.  120 tablet  3  . lansoprazole (PREVACID) 30 MG capsule Take 30 mg by mouth daily as needed.      . lidocaine-prilocaine (EMLA) cream APPLY TO PORT SITE 1 HOUR PRIOR TO CHEMO  30 g  4  . Multiple Vitamin (MULTIVITAMIN) tablet Take 1 tablet by mouth daily.        Marland Kitchen oxyCODONE-acetaminophen (PERCOCET) 10-325 MG per tablet 1 - 2 tabs every 4 hours as needed for pain  120 tablet  0  . TURMERIC PO Take by mouth daily.       . vitamin E 200 UNIT capsule Take 200 Units by mouth daily.        . Vortioxetine HBr 20 MG TABS Take 0.5 tablets by mouth daily.       No current facility-administered medications on file prior to visit.    BP 130/80  Pulse 68  Temp(Src) 98.3 F (36.8 C) (Oral)  Resp 16  Ht 5' 7.5" (1.715 m)  Wt 155 lb 0.6 oz (70.326 kg)  BMI 23.91 kg/m2  SpO2 99%       Objective:   Physical Exam  Constitutional: She is oriented to person, place, and time. She appears well-developed and well-nourished. No distress.  HENT:   Head: Normocephalic and atraumatic.  Cardiovascular: Normal rate and regular rhythm.   No murmur heard. Pulmonary/Chest: Effort normal and breath sounds normal. No respiratory distress. She has no wheezes. She has no rales. She exhibits no tenderness.  Abdominal: Soft. Bowel sounds are normal. She exhibits no distension and no mass. There is no tenderness. There is no rebound and no guarding.  Musculoskeletal: She exhibits no edema.  Neurological: She is alert and oriented to person, place, and time.  Psychiatric: She has a normal mood and affect. Her behavior is normal. Judgment and thought content normal.          Assessment & Plan:

## 2013-07-01 NOTE — Patient Instructions (Addendum)
Complete lab work prior to leaving.  Schedule abdominal ultrasound on the first floor. Stop xanax. You may use ativan (lorazepam) as needed during the day for anxiety. You can use ambien as needed for sleep.Restart prevacid. Call if abdominal or right sided pain worsens or if it doesn't improve.  Follow up in 6 weeks.

## 2013-07-02 ENCOUNTER — Ambulatory Visit (HOSPITAL_BASED_OUTPATIENT_CLINIC_OR_DEPARTMENT_OTHER)
Admission: RE | Admit: 2013-07-02 | Discharge: 2013-07-02 | Disposition: A | Discharge status: Home / Self Care | Payer: 59 | Source: Ambulatory Visit | Attending: Family | Admitting: Family

## 2013-07-02 ENCOUNTER — Other Ambulatory Visit: Payer: Self-pay

## 2013-07-02 ENCOUNTER — Encounter: Payer: Self-pay | Admitting: Family

## 2013-07-02 DIAGNOSIS — R1013 Epigastric pain: Secondary | ICD-10-CM

## 2013-07-02 DIAGNOSIS — D134 Benign neoplasm of liver: Secondary | ICD-10-CM | POA: Insufficient documentation

## 2013-07-02 DIAGNOSIS — K819 Cholecystitis, unspecified: Secondary | ICD-10-CM | POA: Insufficient documentation

## 2013-07-02 MED ORDER — VORTIOXETINE HBR 5 MG PO TABS: 1.0000 | ORAL_TABLET | Freq: Every day | ORAL | Status: DC

## 2013-07-02 NOTE — Telephone Encounter (Signed)
Patient came into the office stating that the Brintillex was supposed to be sent to [facility] in [place] due to [facility] not [facility].  I sent RX to pharmacy for pt and Marj informed pt

## 2013-07-03 DIAGNOSIS — R0781 Pleurodynia: Secondary | ICD-10-CM | POA: Insufficient documentation

## 2013-07-03 DIAGNOSIS — R1013 Epigastric pain: Secondary | ICD-10-CM | POA: Insufficient documentation

## 2013-07-03 NOTE — Assessment & Plan Note (Signed)
Likely musculoskeletal.  No injury to suggest rib fracture.  Recommended tylenol prn.

## 2013-07-03 NOTE — Assessment & Plan Note (Signed)
She has known hx of gastritis and admits to not taking prevacid.  Advised pt to resume prevacid.  Abdominal US is also performed and is negative for cholecystitis.  Incidental notes is made of gallbladder adenomyomatosis.  I doubt that her brintellix is cause of her abdominal pain.

## 2013-07-03 NOTE — Assessment & Plan Note (Signed)
She reports that she is unable to tolerate xanax during the day as it makes her too sleepy. She is able to tolerate lorazepam during the day. This unfortunately, does not help with her insomnia.  I have advised to pt to take lorazepam PRN during the day for anxiety and ambien HS PRN sleep.

## 2013-07-04 ENCOUNTER — Encounter: Payer: Self-pay | Admitting: Family

## 2013-07-04 LAB — URINE CULTURE: Colony Count: 50000

## 2013-07-05 ENCOUNTER — Telehealth: Payer: Self-pay | Admitting: Family

## 2013-07-05 MED ORDER — CIPROFLOXACIN HCL 250 MG PO TABS: 250.0000 mg | ORAL_TABLET | Freq: Two times a day (BID) | ORAL | Status: DC

## 2013-07-05 NOTE — Telephone Encounter (Signed)
Reviewed urine culture. + for e. Coli. Will rx with cipro. rx sent to pharmacy. Left message requesting that she return our call.

## 2013-07-10 ENCOUNTER — Ambulatory Visit (HOSPITAL_BASED_OUTPATIENT_CLINIC_OR_DEPARTMENT_OTHER): Payer: 59

## 2013-07-10 VITALS — BP 111/77 | Temp 96.7°F | Resp 20

## 2013-07-10 DIAGNOSIS — C180 Malignant neoplasm of cecum: Secondary | ICD-10-CM

## 2013-07-10 DIAGNOSIS — Z452 Encounter for adjustment and management of vascular access device: Secondary | ICD-10-CM

## 2013-07-10 MED ORDER — SODIUM CHLORIDE 0.9 % IJ SOLN
10.0000 mL | INTRAMUSCULAR | Status: DC | PRN
  Administered 2013-07-10: 10 mL via INTRAVENOUS
  Filled 2013-07-10: qty 10

## 2013-07-10 MED ORDER — HEPARIN SOD (PORK) LOCK FLUSH 100 UNIT/ML IV SOLN
500.0000 [IU] | Freq: Once | INTRAVENOUS | Status: AC
  Administered 2013-07-10: 500 [IU] via INTRAVENOUS
  Filled 2013-07-10: qty 5

## 2013-07-10 NOTE — Telephone Encounter (Signed)
Pt stopped by the office and was notified of results below and voices understanding.

## 2013-07-10 NOTE — Telephone Encounter (Signed)
Left message on home # to return my call. 

## 2013-07-10 NOTE — Progress Notes (Signed)
Pt reports dizziness on standing. No change in BP when standing vs sitting. When questioned about taking Neurotin, states is taking it more regularly now d/t neuropathy. Explained this may be causing this issue.  Also requests to have note stating can only work 6 hours per day.  Notified Amy, explained to patient that she would have to speak to Dr. Myna Hidalgo and we would call her.  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

## 2013-07-10 NOTE — Patient Instructions (Signed)
Dizziness ° Dizziness means you feel unsteady or lightheaded. You might feel like you are going to pass out (faint). °HOME CARE  °· Drink enough fluids to keep your pee (urine) clear or pale yellow. °· Take your medicines exactly as told by your doctor. If you take blood pressure medicine, always stand up slowly from the lying or sitting position. Hold on to something to steady yourself. °· If you need to stand in one place for a long time, move your legs often. Tighten and relax your leg muscles. °· Have someone stay with you until you feel okay. °· Do not drive or use heavy machinery if you feel dizzy. °· Do not drink alcohol. °GET HELP RIGHT AWAY IF:  °· You feel dizzy or lightheaded and it gets worse. °· You feel sick to your stomach (nauseous), or you throw up (vomit). °· You have trouble talking or walking. °· You feel weak or have trouble using your arms, hands, or legs. °· You cannot think clearly or have trouble forming sentences. °· You have chest pain, belly (abdominal) pain, sweating, or you are short of breath. °· Your vision changes. °· You are bleeding. °· You have problems from your medicine that seem to be getting worse. °MAKE SURE YOU:  °· Understand these instructions. °· Will watch your condition. °· Will get help right away if you are not doing well or get worse. °Document Released: 10/20/2011 Document Revised: 01/23/2012 Document Reviewed: 10/20/2011 °Exit[employer]® Patient Information ©2014 [employer]. ° °

## 2013-07-11 ENCOUNTER — Telehealth: Payer: Self-pay | Admitting: *Deleted

## 2013-07-11 NOTE — Telephone Encounter (Signed)
Received message from pt that FMLA is for 1 yr going forward to cover absences related to COPD and depression. Would like occurrences to cover 1-3 days per month. Form forwarded to Provider.

## 2013-07-11 NOTE — Telephone Encounter (Signed)
Received FMLA form from pt. Left detailed message on cell# to call and clarify dates and condition.

## 2013-07-18 NOTE — Telephone Encounter (Signed)
Was this already completed?

## 2013-07-19 NOTE — Telephone Encounter (Signed)
Left message on home # to return my call. 

## 2013-07-19 NOTE — Telephone Encounter (Signed)
I completed form today.  See form. Thanks.

## 2013-07-22 NOTE — Telephone Encounter (Signed)
Left detailed message on home# that FMLA form is completed and has been placed at front desk for pick up and to call if any questions.

## 2013-07-24 ENCOUNTER — Telehealth: Payer: Self-pay | Admitting: *Deleted

## 2013-07-24 MED ORDER — CYCLOBENZAPRINE HCL 5 MG PO TABS: 5.0000 mg | ORAL_TABLET | Freq: Two times a day (BID) | ORAL | Status: DC | PRN

## 2013-07-24 NOTE — Telephone Encounter (Signed)
Pt left message requesting rx for chlorzoxazone 500mg . Reports that she took one of these before and it helped her pain. Attempted to reach pt to verify what pain this helped and left detailed message to call me back with the requested info.

## 2013-07-24 NOTE — Telephone Encounter (Signed)
Pt left message that she is requesting medication below for the "spasms in her side". Please advise.

## 2013-07-24 NOTE — Telephone Encounter (Signed)
Please let pt know that I sent flexeril which is a similar medication to her pharmacy. This should be used sparingly and for short term. May cause drowsiness, do not drive after taking.

## 2013-07-25 NOTE — Telephone Encounter (Signed)
Left detailed message on cell and to call if any questions. 

## 2013-07-29 NOTE — Telephone Encounter (Signed)
Pt brought FMLA form back to the office and requested some changes be made re: occurrence / episodes and dates. Changes were made. Original placed at front and detailed message left on pt's cell# re: completion.

## 2013-08-07 ENCOUNTER — Other Ambulatory Visit: Payer: Self-pay | Admitting: *Deleted

## 2013-08-07 DIAGNOSIS — G629 Polyneuropathy, unspecified: Secondary | ICD-10-CM

## 2013-08-07 DIAGNOSIS — C180 Malignant neoplasm of cecum: Secondary | ICD-10-CM

## 2013-08-07 MED ORDER — OXYCODONE-ACETAMINOPHEN 10-325 MG PO TABS: ORAL_TABLET | ORAL | Status: DC

## 2013-08-07 NOTE — Telephone Encounter (Signed)
Pt call requesting a refill of her pain medication. Last provided on 07/01/13. Ok to fill her Dr Myna Hidalgo. Will route to him for approval and signature then place at the front desk for pick up.

## 2013-08-13 ENCOUNTER — Telehealth: Payer: Self-pay | Admitting: *Deleted

## 2013-08-13 NOTE — Telephone Encounter (Signed)
Received additional paper from pt that employer is requesting be completed for her FMLA. Form completed, left message for pt to return my call.

## 2013-08-14 NOTE — Telephone Encounter (Signed)
Pt left message returning my call around 2:46. Attempted to reach pt and left message that I will try her again tomorrow.

## 2013-08-15 NOTE — Telephone Encounter (Signed)
Left message that FMLA forms are completed and to let us know how she wants [place] to proceed (will she pick up or does she want [place] to fax them).

## 2013-08-15 NOTE — Telephone Encounter (Signed)
I have received multiple messages from pt and left multiple message in return. Last message stated pt is having trouble with her phone and only sees she has a missed call. Pt stated she will pick up forms tomorrow. Forms placed at front desk for pick up.

## 2013-08-21 ENCOUNTER — Ambulatory Visit (HOSPITAL_BASED_OUTPATIENT_CLINIC_OR_DEPARTMENT_OTHER): Payer: 59

## 2013-08-21 VITALS — BP 144/82 | HR 69 | Temp 96.6°F | Resp 20

## 2013-08-21 DIAGNOSIS — C189 Malignant neoplasm of colon, unspecified: Secondary | ICD-10-CM

## 2013-08-21 DIAGNOSIS — Z452 Encounter for adjustment and management of vascular access device: Secondary | ICD-10-CM

## 2013-08-21 DIAGNOSIS — C180 Malignant neoplasm of cecum: Secondary | ICD-10-CM

## 2013-08-21 MED ORDER — HEPARIN SOD (PORK) LOCK FLUSH 100 UNIT/ML IV SOLN
500.0000 [IU] | Freq: Once | INTRAVENOUS | Status: AC
  Administered 2013-08-21: 500 [IU] via INTRAVENOUS
  Filled 2013-08-21: qty 5

## 2013-08-21 MED ORDER — SODIUM CHLORIDE 0.9 % IJ SOLN
10.0000 mL | INTRAMUSCULAR | Status: DC | PRN
  Administered 2013-08-21: 10 mL via INTRAVENOUS
  Filled 2013-08-21: qty 10

## 2013-08-21 NOTE — Progress Notes (Signed)
PAC flush completed.  Positive blood return.

## 2013-09-17 ENCOUNTER — Ambulatory Visit (INDEPENDENT_AMBULATORY_CARE_PROVIDER_SITE_OTHER): Payer: 59 | Admitting: Family

## 2013-09-17 ENCOUNTER — Encounter: Payer: Self-pay | Admitting: Family

## 2013-09-17 VITALS — BP 100/74 | HR 77 | Temp 98.1°F | Resp 16 | Ht 67.5 in | Wt 154.1 lb

## 2013-09-17 DIAGNOSIS — F329 Major depressive disorder, single episode, unspecified: Secondary | ICD-10-CM

## 2013-09-17 MED ORDER — VORTIOXETINE HBR 10 MG PO TABS: 1.0000 | ORAL_TABLET | Freq: Every day | ORAL | Status: DC

## 2013-09-17 MED ORDER — LORAZEPAM 0.5 MG PO TABS: 0.5000 mg | ORAL_TABLET | Freq: Two times a day (BID) | ORAL | Status: DC | PRN

## 2013-09-17 NOTE — Patient Instructions (Signed)
Increase Brintellix from 5mg  to 10mg  once daily. You will be contacted about your referral to psychiatry. Call if depression symptoms worsen between now and your next visit or if you develop stomach discomfort. Continue to work with your therapist. Follow up in 1 month.

## 2013-09-17 NOTE — Progress Notes (Signed)
Subjective:    Patient ID: Teresa Morgan, female    DOB: Apr 10, 1956, 57 y.o.   MRN: [id number]  HPI  Teresa Morgan is a 57 yr old female who presents today to discuss worsening depression.  She is currently maintained on Brintellix. Reports that she feels well on her days off.  Seeing a Veterinary surgeon.  Reports that Sunday she gets "knots" in her stomach knowing that she has to return to work.  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  She reports that she has applied for an Ad Hoc job.  Feels that she is being discriminated against. Reports that taking 1/2 xanax during the day.  Not helping.  Using xanax for sleep and lorazepam for anxiety which helps better.      Review of Systems See HPI  Past Medical History  Diagnosis Date  . Anemia   . Anxiety   . Arthritis   . Depression   . GERD (gastroesophageal reflux disease)   . Ulcer   . Colon cancer   . COPD with emphysema 06/18/2012    History   Social History  . Marital Status: Married    Spouse Name: N/A    Number of Children: 3  . 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 Smoking status: Former Smoker -- 1.00 packs/day for 25 years    Types: Cigarettes    Quit date: 06/08/2005  . Smokeless tobacco: Never Used  . Alcohol Use: 0.6 oz/week    1 Glasses of wine per week     Comment: social  . Drug Use: No  . Sexual Activity: Not Currently    Birth Control/ Protection: Surgical   Other Topics Concern  . 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 Heel spurs    . Hemrrhoids    . Plantar fascia surgery  2009  . Colon resection  2012    for colon cancer  . Colon surgery  07/07/11    lap ileocolectomy  . Tubal ligation  07/1980  . Appendectomy  07/07/11     Family History  Problem Relation Age of Onset  . Diabetes Mother   . Colon cancer Cousin     Allergies  Allergen Reactions  . Aspirin     Upset stomach  . Cymbalta [Duloxetine Hcl] Swelling    Swollen lips   . Prozac [Fluoxetine Hcl] Swelling    Lip swelling  . Wellbutrin [Bupropion] Swelling    Lip swelling    Current Outpatient Prescriptions on File Prior to Visit  Medication Sig Dispense Refill  . B Complex-C (B-COMPLEX WITH VITAMIN C) tablet Take 1 tablet by mouth daily.        . Calcium-Vitamin D 600-125 MG-UNIT TABS Take by mouth daily.        . ciprofloxacin (CIPRO) 250 MG tablet Take 1 tablet (250 mg total) by mouth 2 (two) times daily.  10 tablet  0  . cyclobenzaprine (FLEXERIL) 5 MG tablet Take 1 tablet (5 mg total) by mouth 2 (two) times daily as needed for muscle spasms.  25 tablet  0  . Fluticasone Furoate-Vilanterol (BREO  ELLIPTA) 100-25 MCG/INH AEPB Inhale 1 puff into the lungs daily.  30 each  6  . gabapentin (NEURONTIN) 600 MG tablet Take 1 tablet (600 mg total) by mouth 4 (four) times daily.  120 tablet  3  . lansoprazole (PREVACID) 30 MG capsule Take 1 capsule (30 mg total) by mouth daily as needed.  30 capsule  3  . lidocaine-prilocaine (EMLA) cream APPLY TO PORT SITE 1 HOUR PRIOR TO CHEMO  30 g  4  . LORazepam (ATIVAN) 0.5 MG tablet Take 1 tablet (0.5 mg total) by mouth 2 (two) times daily as needed for anxiety.  30 tablet  0  . Multiple Vitamin (MULTIVITAMIN) tablet Take 1 tablet by mouth daily.        Marland Kitchen oxyCODONE-acetaminophen (PERCOCET) 10-325 MG per tablet 1 - 2 tabs every 4 hours as needed for pain  120 tablet  0  . TURMERIC PO Take by mouth daily.       . vitamin E 200 UNIT capsule Take 200 Units by mouth daily.        . Vortioxetine HBr (BRINTELLIX) 5 MG TABS Take 1 tablet by mouth daily.  30 tablet  3  . zolpidem (AMBIEN) 5 MG tablet Take 1 tablet (5 mg total) by mouth at bedtime as needed for sleep.  30 tablet  0   No current  facility-administered medications on file prior to visit.    BP 100/74  Pulse 77  Temp(Src) 98.1 F (36.7 C) (Oral)  Resp 16  Ht 5' 7.5" (1.715 m)  Wt 154 lb 1.3 oz (69.89 kg)  BMI 23.76 kg/m2  SpO2 99%       Objective:   Physical Exam  Constitutional: She appears well-developed and well-nourished. No distress.  Psychiatric:  Pleasant, but intermittently tearful.           Assessment & Plan:

## 2013-09-19 ENCOUNTER — Other Ambulatory Visit: Payer: Self-pay

## 2013-09-19 NOTE — Assessment & Plan Note (Signed)
Deteriorated.  Increase brintellex from 5mg  to 10mg .  Continue to work with Veterinary surgeon.  Plan 1 month leave from work. Refer to psychiatry. 25 minutes spent with pt today.  >50% of this time was spent counseling pt on her depression.

## 2013-09-23 ENCOUNTER — Other Ambulatory Visit: Payer: Self-pay | Admitting: *Deleted

## 2013-09-23 DIAGNOSIS — C180 Malignant neoplasm of cecum: Secondary | ICD-10-CM

## 2013-09-23 DIAGNOSIS — G629 Polyneuropathy, unspecified: Secondary | ICD-10-CM

## 2013-09-23 MED ORDER — OXYCODONE-ACETAMINOPHEN 10-325 MG PO TABS: ORAL_TABLET | ORAL | Status: DC

## 2013-09-24 ENCOUNTER — Other Ambulatory Visit: Payer: Self-pay | Admitting: *Deleted

## 2013-09-24 ENCOUNTER — Telehealth: Payer: Self-pay | Admitting: Family

## 2013-09-24 DIAGNOSIS — G629 Polyneuropathy, unspecified: Secondary | ICD-10-CM

## 2013-09-24 DIAGNOSIS — C180 Malignant neoplasm of cecum: Secondary | ICD-10-CM

## 2013-09-24 MED ORDER — OXYCODONE-ACETAMINOPHEN 10-325 MG PO TABS: ORAL_TABLET | ORAL | Status: DC

## 2013-09-24 NOTE — Telephone Encounter (Signed)
Pt call requesting a refill of her pain medication. Last provided on 08/07/13. Ok to fill her Dr Myna Hidalgo. Will route to him for approval and signature then place at the front desk for pick up.  Note: It was routed to Dr Myna Hidalgo yesterday but did not print out on the printer. Re-routed again today (09/24/13) for signature and approval.

## 2013-09-24 NOTE — Telephone Encounter (Signed)
OK for 30 tabs no refills.

## 2013-09-24 NOTE — Telephone Encounter (Signed)
Refill- zolpidem tartrate 5mg  tab. Take one tablet by mouth at bedtime as needed for sleep. Qty 30 last fill 8.18.14

## 2013-09-25 ENCOUNTER — Ambulatory Visit: Payer: Self-pay | Admitting: Family

## 2013-09-25 MED ORDER — ZOLPIDEM TARTRATE 5 MG PO TABS: 5.0000 mg | ORAL_TABLET | Freq: Every evening | ORAL | Status: DC | PRN

## 2013-09-25 NOTE — Telephone Encounter (Signed)
Rx called to [facility] at [facility] as below.

## 2013-09-27 ENCOUNTER — Encounter: Payer: Self-pay | Admitting: Family

## 2013-09-27 ENCOUNTER — Ambulatory Visit (INDEPENDENT_AMBULATORY_CARE_PROVIDER_SITE_OTHER): Payer: 59 | Admitting: Family

## 2013-09-27 VITALS — BP 130/80 | HR 66 | Temp 98.0°F | Resp 16 | Ht 67.5 in | Wt 154.1 lb

## 2013-09-27 DIAGNOSIS — F329 Major depressive disorder, single episode, unspecified: Secondary | ICD-10-CM

## 2013-09-27 MED ORDER — ZOLPIDEM TARTRATE 10 MG PO TABS: 10.0000 mg | ORAL_TABLET | Freq: Every evening | ORAL | Status: DC | PRN

## 2013-09-27 MED ORDER — VORTIOXETINE HBR 5 MG PO TABS: 1.0000 | ORAL_TABLET | Freq: Two times a day (BID) | ORAL | Status: DC

## 2013-09-27 NOTE — Progress Notes (Signed)
Pre visit review using our clinic review tool, if applicable. No additional management support is needed unless otherwise documented below in the visit note. 

## 2013-09-27 NOTE — Progress Notes (Signed)
Subjective:    Patient ID: Teresa Morgan, female    DOB: [birth year], 57 y.o.   MRN: [id number]  HPI  Ms. Walpole is a 57 yr old female who presents today for follow up of depression.  Last visit she was referred to psychiatry, brintellix was increased from 5-10mg  and she was advised to continue to work with a therapist.  Dr. Carie Caddy first apt is in February.  She also tried Financial risk analyst.   She has been out of work for 1.5 weeks. Reports that she is feeling well at home.  No tearfulness at home.  Needs to return to work due to finances. Tried to file her abscence through [employer].  This was denied. Some GI upset on the 10mg  brintellix.  Still having insomnia. Dr. Myna Hidalgo did not want to refill the xanax as she is on lorazepam. Reports that she cannot sleep due to neuropathy.    Review of Systems Past Medical History  Diagnosis Date  . Anemia   . Anxiety   . Arthritis   . Depression   . GERD (gastroesophageal reflux disease)   . Ulcer   . Colon cancer   . COPD with emphysema 06/18/2012    History   Social History  . Marital Status: Married    Spouse Name: N/A    Number of Children: 3  . 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 Smoking status: Former Smoker -- 1.00 packs/day for 25 years    Types: Cigarettes    Quit date: 06/08/2005  . Smokeless tobacco: Never Used  . Alcohol Use: 0.6 oz/week    1 Glasses of wine per week     Comment: social  . Drug Use: No  . Sexual Activity: Not Currently    Birth Control/ Protection: Surgical   Other Topics Concern  . 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 Heel spurs    . Hemrrhoids    . Plantar fascia surgery  2009  . Colon resection  2012    for colon cancer  . Colon surgery  07/07/11    lap ileocolectomy   . Tubal ligation  07/1980  . Appendectomy  07/07/11    Family History  Problem Relation Age of Onset  . Diabetes Mother   . Colon cancer Cousin     Allergies  Allergen Reactions  . Aspirin     Upset stomach  . Cymbalta [Duloxetine Hcl] Swelling    Swollen lips   . Prozac [Fluoxetine Hcl] Swelling    Lip swelling  . Wellbutrin [Bupropion] Swelling    Lip swelling    Current Outpatient Prescriptions on File Prior to Visit  Medication Sig Dispense Refill  . ALPRAZolam (XANAX) 1 MG tablet Take 1 mg by mouth at bedtime as needed.      . Calcium-Vitamin D 600-125 MG-UNIT TABS Take by mouth daily.        . cyclobenzaprine (FLEXERIL) 5 MG tablet Take 1 tablet (5 mg total) by mouth 2 (two) times daily as needed for muscle spasms.  25 tablet  0  . Fluticasone Furoate-Vilanterol (BREO ELLIPTA) 100-25 MCG/INH AEPB Inhale 1 puff into the lungs daily.  30 each  6  .  gabapentin (NEURONTIN) 600 MG tablet Take 1 tablet (600 mg total) by mouth 4 (four) times daily.  120 tablet  3  . lansoprazole (PREVACID) 30 MG capsule Take 1 capsule (30 mg total) by mouth daily as needed.  30 capsule  3  . lidocaine-prilocaine (EMLA) cream APPLY TO PORT SITE 1 HOUR PRIOR TO CHEMO  30 g  4  . LORazepam (ATIVAN) 0.5 MG tablet Take 1 tablet (0.5 mg total) by mouth 2 (two) times daily as needed for anxiety.  30 tablet  0  . Multiple Vitamin (MULTIVITAMIN) tablet Take 1 tablet by mouth daily.        Marland Kitchen oxyCODONE-acetaminophen (PERCOCET) 10-325 MG per tablet 1 - 2 tabs every 4 hours as needed for pain  120 tablet  0  . TURMERIC PO Take by mouth daily.       . vitamin E 200 UNIT capsule Take 200 Units by mouth daily.        . Vortioxetine HBr (BRINTELLIX) 10 MG TABS Take 1 tablet (10 mg total) by mouth daily.  30 tablet  1  . zolpidem (AMBIEN) 5 MG tablet Take 1 tablet (5 mg total) by mouth at bedtime as needed for sleep.  30 tablet  0   No current facility-administered medications on file prior to visit.    BP  130/80  Pulse 66  Temp(Src) 98 F (36.7 C) (Oral)  Resp 16  Ht 5' 7.5" (1.715 m)  Wt 154 lb 1.9 oz (69.908 kg)  BMI 23.77 kg/m2  SpO2 99%       Objective:   Physical Exam  Constitutional: She appears well-developed and well-nourished. No distress.  Psychiatric: She has a normal mood and affect. Her behavior is normal. Thought content normal.          Assessment & Plan:

## 2013-09-27 NOTE — Assessment & Plan Note (Addendum)
Some improvement.  Try brintellix 5mg  bid to see if she can better tolerate from a GI standpoint.  Release to return to work was provided. Gave number for Behavioral health at [facility]. 20 minutes spent counseling pt on depression.

## 2013-09-27 NOTE — Patient Instructions (Addendum)
Please call [facility] department and see if you can schedule an appointment with psychiatry [phone]. Change brintellix to 5mg  twice daily. Follow up in 6 weeks.

## 2013-10-01 ENCOUNTER — Telehealth: Payer: Self-pay | Admitting: *Deleted

## 2013-10-01 NOTE — Telephone Encounter (Signed)
She can try Dr. Archer Asa-

## 2013-10-01 NOTE — Telephone Encounter (Signed)
Patient states that [facility] is not taking new patients and would like to know your suggestion as to what to do.?/SLS Please Advise.

## 2013-10-02 ENCOUNTER — Telehealth: Payer: Self-pay | Admitting: *Deleted

## 2013-10-02 ENCOUNTER — Telehealth: Payer: Self-pay | Admitting: Critical Care Medicine

## 2013-10-02 ENCOUNTER — Encounter: Payer: Self-pay | Admitting: Family

## 2013-10-02 NOTE — Telephone Encounter (Signed)
I spoke with pt and appt scheduled. Nothing further needed 

## 2013-10-02 NOTE — Telephone Encounter (Signed)
See letter.

## 2013-10-02 NOTE — Telephone Encounter (Signed)
i have not seen this pt in a year She will need ov with TP or myself before a letter can be produced  PCP can do letter as well

## 2013-10-02 NOTE — Telephone Encounter (Signed)
Received fax from [employer] re: below request. Form forwarded to Provider for completion in place of letter. Please return via fax at [phone].

## 2013-10-02 NOTE — Telephone Encounter (Signed)
Received call from pt stating employer needs return to work letter amended to state that pt may return to work and is not a danger to herself or others. Please advise.

## 2013-10-02 NOTE — Telephone Encounter (Signed)
Form and letter completed and faxed to [phone] as # listed below is the phone #.  Notified pt.

## 2013-10-02 NOTE — Telephone Encounter (Signed)
I called and spoke with pt. She is requesting a letter from [employer] for her work. 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 She wants a letter to state she needs a reasonable accomodation at work. Please advise Dr. Delford Field thanks

## 2013-10-04 ENCOUNTER — Ambulatory Visit: Payer: Self-pay | Admitting: Critical Care Medicine

## 2013-10-14 ENCOUNTER — Encounter (HOSPITAL_BASED_OUTPATIENT_CLINIC_OR_DEPARTMENT_OTHER): Payer: Self-pay | Admitting: Emergency Medicine

## 2013-10-14 ENCOUNTER — Emergency Department (HOSPITAL_BASED_OUTPATIENT_CLINIC_OR_DEPARTMENT_OTHER)
Admission: EM | Admit: 2013-10-14 | Discharge: 2013-10-14 | Disposition: A | Discharge status: Home / Self Care | Payer: 59 | Attending: Emergency Medicine | Admitting: Emergency Medicine

## 2013-10-14 ENCOUNTER — Emergency Department (HOSPITAL_BASED_OUTPATIENT_CLINIC_OR_DEPARTMENT_OTHER): Payer: 59

## 2013-10-14 DIAGNOSIS — Z872 Personal history of diseases of the skin and subcutaneous tissue: Secondary | ICD-10-CM | POA: Insufficient documentation

## 2013-10-14 DIAGNOSIS — Z87891 Personal history of nicotine dependence: Secondary | ICD-10-CM | POA: Insufficient documentation

## 2013-10-14 DIAGNOSIS — K219 Gastro-esophageal reflux disease without esophagitis: Secondary | ICD-10-CM | POA: Insufficient documentation

## 2013-10-14 DIAGNOSIS — F411 Generalized anxiety disorder: Secondary | ICD-10-CM | POA: Insufficient documentation

## 2013-10-14 DIAGNOSIS — Z862 Personal history of diseases of the blood and blood-forming organs and certain disorders involving the immune mechanism: Secondary | ICD-10-CM | POA: Insufficient documentation

## 2013-10-14 DIAGNOSIS — J029 Acute pharyngitis, unspecified: Secondary | ICD-10-CM | POA: Diagnosis not present

## 2013-10-14 DIAGNOSIS — F329 Major depressive disorder, single episode, unspecified: Secondary | ICD-10-CM | POA: Insufficient documentation

## 2013-10-14 DIAGNOSIS — M129 Arthropathy, unspecified: Secondary | ICD-10-CM | POA: Insufficient documentation

## 2013-10-14 DIAGNOSIS — J438 Other emphysema: Secondary | ICD-10-CM | POA: Diagnosis not present

## 2013-10-14 DIAGNOSIS — J441 Chronic obstructive pulmonary disease with (acute) exacerbation: Secondary | ICD-10-CM

## 2013-10-14 DIAGNOSIS — IMO0002 Reserved for concepts with insufficient information to code with codable children: Secondary | ICD-10-CM | POA: Insufficient documentation

## 2013-10-14 DIAGNOSIS — R05 Cough: Secondary | ICD-10-CM | POA: Diagnosis present

## 2013-10-14 DIAGNOSIS — Z85038 Personal history of other malignant neoplasm of large intestine: Secondary | ICD-10-CM | POA: Insufficient documentation

## 2013-10-14 DIAGNOSIS — Z79899 Other long term (current) drug therapy: Secondary | ICD-10-CM | POA: Insufficient documentation

## 2013-10-14 DIAGNOSIS — F3289 Other specified depressive episodes: Secondary | ICD-10-CM | POA: Insufficient documentation

## 2013-10-14 MED ORDER — AZITHROMYCIN 250 MG PO TABS
ORAL_TABLET | ORAL | Status: AC
  Filled 2013-10-14: qty 2

## 2013-10-14 MED ORDER — PHENYLEPH-PROMETHAZINE-COD 5-6.25-10 MG/5ML PO SYRP: 5.0000 mL | ORAL_SOLUTION | ORAL | Status: DC | PRN

## 2013-10-14 MED ORDER — AZITHROMYCIN 250 MG PO TABS
500.0000 mg | ORAL_TABLET | Freq: Once | ORAL | Status: AC
  Administered 2013-10-14: 500 mg via ORAL

## 2013-10-14 MED ORDER — AZITHROMYCIN 250 MG PO TABS: ORAL_TABLET | ORAL | Status: DC

## 2013-10-14 MED ORDER — PREDNISONE 10 MG PO TABS: 20.0000 mg | ORAL_TABLET | Freq: Two times a day (BID) | ORAL | Status: DC

## 2013-10-14 NOTE — ED Provider Notes (Signed)
CSN: [id number]     Arrival date & time 10/14/13  1953 History   First MD Initiated Contact with Patient 10/14/13 2100     Chief Complaint  Patient presents with  . Cough   (Consider location/radiation/quality/duration/timing/severity/associated sxs/prior Treatment) Patient is a 57 y.o. female presenting with cough. The history is provided by the patient.  Cough Cough characteristics:  Productive Sputum characteristics:  Yellow Severity:  Moderate Onset quality:  Gradual Duration:  5 days Timing:  Sporadic Progression:  Worsening Chronicity:  New Smoker: quit 8 years ago.   Context: upper respiratory infection   Relieved by:  Nothing Worsened by:  Nothing tried Ineffective treatments: nyQuil. Associated symptoms: chills, fever, shortness of breath (chronic with COPD) and sore throat   Associated symptoms: no headaches and no rash   Risk factors: no recent infection and no recent travel    Teresa Morgan is a 57 y.o. female who presents to the ED with cough and congestion x 5 days.  Symptoms have gotten worse over the past 2 days. History of COPD and cancer.  Past Medical History  Diagnosis Date  . Anemia   . Anxiety   . Arthritis   . Depression   . GERD (gastroesophageal reflux disease)   . Ulcer   . Colon cancer   . COPD with emphysema 06/18/2012   Past Surgical History  Procedure Laterality Date  . Heel spurs    . Hemrrhoids    . Plantar fascia surgery  2009  . Colon resection  2012    for colon cancer  . Colon surgery  07/07/11    lap ileocolectomy  . Tubal ligation  07/1980  . Appendectomy  07/07/11  . Portacath     Family History  Problem Relation Age of Onset  . Diabetes Mother   . Colon cancer Cousin    History  Substance Use Topics  . Smoking status: Former Smoker -- 1.00 packs/day for 25 years    Types: Cigarettes    Quit date: 06/08/2005  . Smokeless tobacco: Never Used  . Alcohol Use: 0.0 oz/week     Comment: social   OB History   Grav Para Term  Preterm Abortions TAB SAB Ect Mult Living                 Review of Systems  Constitutional: Positive for fever and chills.  HENT: Positive for congestion and sore throat.   Eyes: Negative for visual disturbance.  Respiratory: Positive for cough and shortness of breath (chronic with COPD).   Gastrointestinal: Negative for nausea, vomiting and abdominal pain.  Genitourinary: Negative for dysuria, frequency and decreased urine volume.  Skin: Negative for rash.  Neurological: Negative for light-headedness and headaches.  Psychiatric/Behavioral: The patient is not nervous/anxious.     Allergies  Aspirin; Cymbalta; Prozac; and Wellbutrin  Home Medications   Current Outpatient Rx  Name  Route  Sig  Dispense  Refill  . ALPRAZolam (XANAX) 1 MG tablet   Oral   Take 1 mg by mouth at bedtime as needed.         . Calcium-Vitamin D 600-125 MG-UNIT TABS   Oral   Take by mouth daily.           . cyclobenzaprine (FLEXERIL) 5 MG tablet   Oral   Take 1 tablet (5 mg total) by mouth 2 (two) times daily as needed for muscle spasms.   25 tablet   0   . Fluticasone Furoate-Vilanterol (BREO ELLIPTA) 100-25  MCG/INH AEPB   Inhalation   Inhale 1 puff into the lungs daily.   30 each   6   . gabapentin (NEURONTIN) 600 MG tablet   Oral   Take 1 tablet (600 mg total) by mouth 4 (four) times daily.   120 tablet   3   . lansoprazole (PREVACID) 30 MG capsule   Oral   Take 1 capsule (30 mg total) by mouth daily as needed.   30 capsule   3   . lidocaine-prilocaine (EMLA) cream      APPLY TO PORT SITE 1 HOUR PRIOR TO CHEMO   30 g   4   . LORazepam (ATIVAN) 0.5 MG tablet   Oral   Take 1 tablet (0.5 mg total) by mouth 2 (two) times daily as needed for anxiety.   30 tablet   0   . Multiple Vitamin (MULTIVITAMIN) tablet   Oral   Take 1 tablet by mouth daily.           Marland Kitchen oxyCODONE-acetaminophen (PERCOCET) 10-325 MG per tablet      1 - 2 tabs every 4 hours as needed for pain    120 tablet   0   . TURMERIC PO   Oral   Take by mouth daily.          . vitamin E 200 UNIT capsule   Oral   Take 200 Units by mouth daily.           . Vortioxetine HBr (BRINTELLIX) 5 MG TABS   Oral   Take 1 tablet by mouth 2 (two) times daily.   60 tablet   2   . zolpidem (AMBIEN) 10 MG tablet   Oral   Take 1 tablet (10 mg total) by mouth at bedtime as needed for sleep.   30 tablet   0    BP 139/77  Pulse 86  Temp(Src) 98.8 F (37.1 C) (Oral)  Resp 16  Ht 5\' 9"  (1.753 m)  Wt 152 lb (68.947 kg)  BMI 22.44 kg/m2  SpO2 98% Physical Exam  Nursing note and vitals reviewed. Constitutional: She is oriented to person, place, and time. She appears well-developed and well-nourished.  HENT:  Head: Normocephalic and atraumatic.  Eyes: EOM are normal.  Neck: Neck supple.  Cardiovascular: Normal rate and regular rhythm.   Pulmonary/Chest: Effort normal. No respiratory distress. She has decreased breath sounds. She has rhonchi.  Abdominal: Soft. There is no tenderness.  Musculoskeletal: Normal range of motion.  Neurological: She is alert and oriented to person, place, and time. No cranial nerve deficit.  Skin: Skin is warm and dry.  Psychiatric: She has a normal mood and affect. Her behavior is normal.    ED Course  Procedures  EKG Interpretation   None     Dg Chest 2 View  10/14/2013   CLINICAL DATA:  COPD.  Cough and congestion  EXAM: CHEST  2 VIEW  COMPARISON:  None.  FINDINGS: There is a port in the left anterior chest wall. Chronic bronchitic markings similar prior. No focal consolidation. No pneumothorax .  IMPRESSION: 1. No acute acute cardiopulmonary findings. 2. Chronic interstitial lung disease. Cannot exclude lower lobe bronchitis.   Electronically Signed   By: Genevive Bi M.D.   On: 10/14/2013 22:04     MDM  57 y.o. female with COPD exacerbation. Will treat with antibiotic, prednisone and cough medication. She will follow up with her PCP. I have  reviewed this patient's vital signs,  nurses notes, appropriate labs and imaging.  I have discussed findings and plan of care with the patient. She voices understanding. Stable for discharge without any immediate complications. She is schedule for CT scans later this week for follow up of her colon cancer.     Medication List    TAKE these medications       azithromycin 250 MG tablet  Commonly known as:  ZITHROMAX Z-PAK  Take one tablet PO daily starting 10/15/2013     Phenyleph-Promethazine-Cod 5-6.25-10 MG/5ML Syrp  Take 5 mLs by mouth every 4 (four) hours as needed.     predniSONE 10 MG tablet  Commonly known as:  DELTASONE  Take 2 tablets (20 mg total) by mouth 2 (two) times daily with a meal.      ASK your doctor about these medications       ALPRAZolam 1 MG tablet  Commonly known as:  XANAX  Take 1 mg by mouth at bedtime as needed.     Calcium-Vitamin D 600-125 MG-UNIT Tabs  Take by mouth daily.     cyclobenzaprine 5 MG tablet  Commonly known as:  FLEXERIL  Take 1 tablet (5 mg total) by mouth 2 (two) times daily as needed for muscle spasms.     Fluticasone Furoate-Vilanterol 100-25 MCG/INH Aepb  Commonly known as:  BREO ELLIPTA  Inhale 1 puff into the lungs daily.     gabapentin 600 MG tablet  Commonly known as:  NEURONTIN  Take 1 tablet (600 mg total) by mouth 4 (four) times daily.     lansoprazole 30 MG capsule  Commonly known as:  PREVACID  Take 1 capsule (30 mg total) by mouth daily as needed.     lidocaine-prilocaine cream  Commonly known as:  EMLA  APPLY TO PORT SITE 1 HOUR PRIOR TO CHEMO     LORazepam 0.5 MG tablet  Commonly known as:  ATIVAN  Take 1 tablet (0.5 mg total) by mouth 2 (two) times daily as needed for anxiety.     multivitamin tablet  Take 1 tablet by mouth daily.     oxyCODONE-acetaminophen 10-325 MG per tablet  Commonly known as:  PERCOCET  1 - 2 tabs every 4 hours as needed for pain     TURMERIC PO  Take by mouth daily.      vitamin E 200 UNIT capsule  Take 200 Units by mouth daily.     Vortioxetine HBr 5 MG Tabs  Commonly known as:  BRINTELLIX  Take 1 tablet by mouth 2 (two) times daily.     zolpidem 10 MG tablet  Commonly known as:  AMBIEN  Take 1 tablet (10 mg total) by mouth at bedtime as needed for sleep.            [address] [place], [place] 10/15/13 [phone]

## 2013-10-14 NOTE — ED Notes (Signed)
Prod cough since 11/30

## 2013-10-15 NOTE — ED Provider Notes (Signed)
Medical screening examination/treatment/procedure(s) were performed by non-physician practitioner and as supervising physician I was immediately available for consultation/collaboration.  EKG Interpretation   None        Jaremy Nosal K Linker, MD 10/15/13 1720 

## 2013-10-16 ENCOUNTER — Ambulatory Visit: Payer: Self-pay | Admitting: Family

## 2013-10-17 ENCOUNTER — Encounter (HOSPITAL_BASED_OUTPATIENT_CLINIC_OR_DEPARTMENT_OTHER): Payer: Self-pay

## 2013-10-17 ENCOUNTER — Ambulatory Visit (HOSPITAL_BASED_OUTPATIENT_CLINIC_OR_DEPARTMENT_OTHER)
Admission: RE | Admit: 2013-10-17 | Discharge: 2013-10-17 | Disposition: A | Discharge status: Home / Self Care | Payer: 59 | Source: Ambulatory Visit | Attending: Medical | Admitting: Medical

## 2013-10-17 DIAGNOSIS — C180 Malignant neoplasm of cecum: Secondary | ICD-10-CM

## 2013-10-17 DIAGNOSIS — J438 Other emphysema: Secondary | ICD-10-CM | POA: Diagnosis not present

## 2013-10-17 DIAGNOSIS — K5730 Diverticulosis of large intestine without perforation or abscess without bleeding: Secondary | ICD-10-CM | POA: Insufficient documentation

## 2013-10-17 DIAGNOSIS — R918 Other nonspecific abnormal finding of lung field: Secondary | ICD-10-CM | POA: Diagnosis not present

## 2013-10-17 DIAGNOSIS — N289 Disorder of kidney and ureter, unspecified: Secondary | ICD-10-CM | POA: Diagnosis not present

## 2013-10-17 DIAGNOSIS — Q762 Congenital spondylolisthesis: Secondary | ICD-10-CM | POA: Diagnosis not present

## 2013-10-17 MED ORDER — IOHEXOL 300 MG/ML  SOLN
100.0000 mL | Freq: Once | INTRAMUSCULAR | Status: AC | PRN
  Administered 2013-10-17: 100 mL via INTRAVENOUS

## 2013-10-18 ENCOUNTER — Telehealth: Payer: Self-pay | Admitting: *Deleted

## 2013-10-18 NOTE — Telephone Encounter (Signed)
Pt returned my call stating current FMLA request is for the 2 week period she was taken out of work due to her stress / depression. States previous form was for COPD.  Form forwarded to Provider for completion.

## 2013-10-18 NOTE — Telephone Encounter (Signed)
Pt dropped off FMLA forms for depression.  Attempted to reach pt for verification that this is for ongoing appts due to her depression.  Short version of FMLA completed in August for pt's depression for specific dates. Has pt missed more work due to her depression? Is this just for future appts related to diagnosis? Left detailed message for pt to call with her response.

## 2013-10-20 ENCOUNTER — Other Ambulatory Visit: Payer: Self-pay | Admitting: Critical Care Medicine

## 2013-10-21 ENCOUNTER — Encounter: Payer: Self-pay | Admitting: Family

## 2013-10-21 ENCOUNTER — Ambulatory Visit: Payer: Self-pay | Admitting: Family

## 2013-10-21 ENCOUNTER — Ambulatory Visit (HOSPITAL_BASED_OUTPATIENT_CLINIC_OR_DEPARTMENT_OTHER): Payer: 59 | Admitting: Hematology & Oncology

## 2013-10-21 ENCOUNTER — Ambulatory Visit (INDEPENDENT_AMBULATORY_CARE_PROVIDER_SITE_OTHER): Payer: 59 | Admitting: Family

## 2013-10-21 ENCOUNTER — Other Ambulatory Visit (HOSPITAL_BASED_OUTPATIENT_CLINIC_OR_DEPARTMENT_OTHER): Payer: 59 | Admitting: Lab

## 2013-10-21 VITALS — BP 118/80 | HR 64 | Temp 97.7°F | Resp 16 | Ht 67.5 in | Wt 156.1 lb

## 2013-10-21 VITALS — BP 118/84 | HR 76 | Temp 98.2°F | Resp 14 | Ht 69.0 in | Wt 156.0 lb

## 2013-10-21 DIAGNOSIS — R918 Other nonspecific abnormal finding of lung field: Secondary | ICD-10-CM

## 2013-10-21 DIAGNOSIS — F411 Generalized anxiety disorder: Secondary | ICD-10-CM

## 2013-10-21 DIAGNOSIS — J439 Emphysema, unspecified: Secondary | ICD-10-CM

## 2013-10-21 DIAGNOSIS — C180 Malignant neoplasm of cecum: Secondary | ICD-10-CM

## 2013-10-21 DIAGNOSIS — Z85038 Personal history of other malignant neoplasm of large intestine: Secondary | ICD-10-CM

## 2013-10-21 DIAGNOSIS — G8929 Other chronic pain: Secondary | ICD-10-CM

## 2013-10-21 DIAGNOSIS — J438 Other emphysema: Secondary | ICD-10-CM

## 2013-10-21 DIAGNOSIS — G620 Drug-induced polyneuropathy: Secondary | ICD-10-CM

## 2013-10-21 DIAGNOSIS — G4700 Insomnia, unspecified: Secondary | ICD-10-CM

## 2013-10-21 LAB — CBC WITH DIFFERENTIAL (CANCER CENTER ONLY)
BASO%: 0.2 % (ref 0.0–2.0)
EOS%: 1.6 % (ref 0.0–7.0)
HCT: 41.4 % (ref 34.8–46.6)
HGB: 13.2 g/dL (ref 11.6–15.9)
LYMPH#: 1.9 10*3/uL (ref 0.9–3.3)
MCH: 22.3 pg — ABNORMAL LOW (ref 26.0–34.0)
MCHC: 31.9 g/dL — ABNORMAL LOW (ref 32.0–36.0)
MONO%: 5.7 % (ref 0.0–13.0)
NEUT#: 5.6 10*3/uL (ref 1.5–6.5)
NEUT%: 69.5 % (ref 39.6–80.0)
Platelets: 179 10*3/uL (ref 145–400)
RBC: 5.91 10*6/uL — ABNORMAL HIGH (ref 3.70–5.32)
RDW: 16.6 % — ABNORMAL HIGH (ref 11.1–15.7)

## 2013-10-21 MED ORDER — ALPRAZOLAM 1 MG PO TABS: 1.0000 mg | ORAL_TABLET | Freq: Every evening | ORAL | Status: DC | PRN

## 2013-10-21 NOTE — Progress Notes (Signed)
Subjective:    Patient ID: Teresa Morgan, female    DOB: Feb 06, 1956, 57 y.o.   MRN: [id number]  HPI  Teresa Morgan is a 57 yr old female who presents for 1 week follow up of her COPD exacerbation. She was evaluated in the ED.  Records are reviewed. She was treated with pred taper, azithromycin and cough syrup. She reports feeling better. No longer wheezing. Breathing well. Cough lingers but is improving.    Review of Systems See HPI  Past Medical History  Diagnosis Date  . Anemia   . Anxiety   . Arthritis   . Depression   . GERD (gastroesophageal reflux disease)   . Ulcer   . Colon cancer   . COPD with emphysema 06/18/2012    History   Social History  . Marital Status: Married    Spouse Name: N/A    Number of Children: 3  . 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 Smoking status: Former Smoker -- 1.00 packs/day for 25 years    Types: Cigarettes    Quit date: 06/08/2005  . Smokeless tobacco: Never Used  . Alcohol Use: 0.0 oz/week     Comment: social  . Drug Use: No  . Sexual Activity: Not on file   Other Topics Concern  . 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 Heel spurs    . Hemrrhoids    . Plantar fascia surgery  2009  . Colon resection  2012    for colon cancer  . Colon surgery  07/07/11    lap ileocolectomy  . Tubal ligation  07/1980  . Appendectomy  07/07/11  . Portacath      Family History  Problem Relation Age of Onset  . Diabetes Mother   . Colon cancer Cousin     Allergies  Allergen Reactions  . Aspirin     Upset stomach  . Cymbalta [Duloxetine Hcl] Swelling    Swollen lips   . Prozac [Fluoxetine Hcl] Swelling    Lip swelling  . Wellbutrin [Bupropion] Swelling    Lip swelling    Current Outpatient Prescriptions on  File Prior to Visit  Medication Sig Dispense Refill  . ALPRAZolam (XANAX) 1 MG tablet Take 1 tablet (1 mg total) by mouth at bedtime as needed.  30 tablet  3  . BREO ELLIPTA 100-25 MCG/INH AEPB INHALE 1 PUFF BY MOUTH EVERY DAY  60 each  3  . Calcium-Vitamin D 600-125 MG-UNIT TABS Take by mouth daily.        . cyclobenzaprine (FLEXERIL) 5 MG tablet Take 1 tablet (5 mg total) by mouth 2 (two) times daily as needed for muscle spasms.  25 tablet  0  . gabapentin (NEURONTIN) 600 MG tablet Take 1 tablet (600 mg total) by mouth 4 (four) times daily.  120 tablet  3  . lansoprazole (PREVACID) 30 MG capsule Take 1 capsule (30 mg total) by mouth daily as needed.  30 capsule  3  . lidocaine-prilocaine (EMLA) cream APPLY TO PORT SITE 1 HOUR PRIOR TO CHEMO  30 g  4  . LORazepam (ATIVAN) 0.5 MG tablet Take 1 tablet (0.5 mg total) by mouth 2 (two)  times daily as needed for anxiety.  30 tablet  0  . Multiple Vitamin (MULTIVITAMIN) tablet Take 1 tablet by mouth daily.        Marland Kitchen oxyCODONE-acetaminophen (PERCOCET) 10-325 MG per tablet 1 - 2 tabs every 4 hours as needed for pain  120 tablet  0  . Phenyleph-Promethazine-Cod 5-6.25-10 MG/5ML SYRP Take 5 mLs by mouth every 4 (four) hours as needed.  120 mL  0  . TURMERIC PO Take by mouth daily.       . vitamin E 200 UNIT capsule Take 200 Units by mouth daily.        . Vortioxetine HBr (BRINTELLIX) 5 MG TABS Take 1 tablet by mouth 2 (two) times daily.  60 tablet  2  . zolpidem (AMBIEN) 10 MG tablet Take 1 tablet (10 mg total) by mouth at bedtime as needed for sleep.  30 tablet  0   No current facility-administered medications on file prior to visit.    BP 118/80  Pulse 64  Temp(Src) 97.7 F (36.5 C) (Oral)  Resp 16  Ht 5' 7.5" (1.715 m)  Wt 156 lb 1.3 oz (70.797 kg)  BMI 24.07 kg/m2       Objective:   Physical Exam  Constitutional: She is oriented to person, place, and time. She appears well-developed and well-nourished. No distress.  HENT:  Head:  Normocephalic and atraumatic.  Cardiovascular: Normal rate and regular rhythm.   No murmur heard. Pulmonary/Chest: Effort normal and breath sounds normal. No respiratory distress. She has no wheezes. She has no rales. She exhibits no tenderness.  Neurological: She is alert and oriented to person, place, and time.  Psychiatric: She has a normal mood and affect. Her behavior is normal. Judgment and thought content normal.          Assessment & Plan:

## 2013-10-21 NOTE — Patient Instructions (Signed)
Please schedule a follow up appointment in 3 months.

## 2013-10-21 NOTE — Assessment & Plan Note (Signed)
Acute exacerbation resolved. Continue breo.

## 2013-10-21 NOTE — Progress Notes (Signed)
This office note has been dictated.

## 2013-10-22 LAB — COMPREHENSIVE METABOLIC PANEL
ALT: 19 U/L (ref 0–35)
AST: 13 U/L (ref 0–37)
Albumin: 4.1 g/dL (ref 3.5–5.2)
Alkaline Phosphatase: 86 U/L (ref 39–117)
Calcium: 9.4 mg/dL (ref 8.4–10.5)
Chloride: 102 mEq/L (ref 96–112)
Potassium: 3.7 mEq/L (ref 3.5–5.3)
Sodium: 138 mEq/L (ref 135–145)
Total Protein: 6.8 g/dL (ref 6.0–8.3)

## 2013-10-22 LAB — LACTATE DEHYDROGENASE: LDH: 198 U/L (ref 94–250)

## 2013-10-22 NOTE — Progress Notes (Signed)
DIAGNOSES: 1. Stage IIIB (T2 N2 M0) adenocarcinoma of the cecum. 2. Neuropathy secondary to chemotherapy.  CURRENT THERAPY:  Observation.  INTERIM HISTORY:  Ms. Yim comes in for her followup.  She is doing fairly well right now.  She is still having the neuropathy issues.  She says gabapentin does seem to help with this.  She also is on Ativan and Xanax.  She only takes the Xanax at nighttime as she says this is the only thing it really helps her rest.  The Ativan during the daytime helps with her anxiety and really does not make her tired.  There has been no problem with bowels or bladder.  She has had no bleeding.  There has been no abdominal pain.  Her last CEA back in June was 1.6.  Her last set of scans were done last week.  There was no evidence of recurrent disease.  She does have some pulmonary changes that would suggest some emphysema and some maybe bronchiectasis.  Of course, the radiologist said a "metastatic disease remains a diagnostic consideration."  PHYSICAL EXAMINATION:  General:  This is a well-developed, well- nourished [place] female, in no obvious distress.  Vital Signs: Temperature of 98.2, pulse 76, respiratory rate 14, blood pressure 118/84, weight is 156 pounds.  Head and Neck:  She has a normocephalic, atraumatic skull.  There are no ocular or oral lesions.  She has no palpable cervical or supraclavicular lymph nodes.  Lungs:  Clear bilaterally.  She has no rales, wheezes, or rhonchi.  Cardiac:  Regular rate and rhythm with a normal S1 and S2.  There are no murmurs, rubs, or bruits.  Abdomen:  Soft.  She has good bowel sounds.  There is no palpable abdominal mass.  She has well-healed laparoscopy scars.  There is no fluid wave.  There is no palpable hepatosplenomegaly.  Back:  No tenderness over the spine, ribs, or hips.  Extremities:  No clubbing, cyanosis, or edema.  She has some tenderness to palpation over her lower legs and feet secondary to  the neuropathy.  She has good pulses.  Skin: No rashes, ecchymosis, or petechia.  Neurological:  She has hypersensitivity to touch in her feet.  LABORATORY STUDIES:  White cell count is 8.1, hemoglobin 13.2, hematocrit 41.4, platelet count 179.  MCV is 70.  IMPRESSION:  Ms. Roehrich is a very nice 57 year old [place] woman with stage IIIB adenocarcinoma of the cecum.  She underwent systemic chemotherapy in the adjuvant setting.  She had FOLFOX.  She reports she developed neuropathy from this.  Neuropathy could persist.  She had 4 positive lymph nodes.  She completed the chemotherapy in March of 2013.  Again, I am not sure what to make of her CT of the chest.  I really am forced to continue to follow this.  I think we will have to get another CT scan on this probably in 6 months.  I went ahead and refilled the Xanax today.  She is on oxycodone for the neuropathic pain, along with the gabapentin. This does keep her functional.  We will go ahead and get her back in 6 months.  I will repeat her scans as we really need to.  She has smoked in the past, so she is at risk for a second kind of primary.    ______________________________ Josph Macho, M.D. PRE/MEDQ  D:  10/21/2013  T:  10/21/2013  Job:  [id number]

## 2013-10-23 NOTE — Telephone Encounter (Signed)
Left message stating that forms are ready to be picked up. Made a copy and will send to be scanned.

## 2013-10-23 NOTE — Telephone Encounter (Signed)
FMLA completed.

## 2013-10-24 ENCOUNTER — Ambulatory Visit (HOSPITAL_BASED_OUTPATIENT_CLINIC_OR_DEPARTMENT_OTHER): Payer: 59

## 2013-10-24 DIAGNOSIS — C180 Malignant neoplasm of cecum: Secondary | ICD-10-CM

## 2013-10-24 DIAGNOSIS — Z452 Encounter for adjustment and management of vascular access device: Secondary | ICD-10-CM

## 2013-10-24 MED ORDER — SODIUM CHLORIDE 0.9 % IJ SOLN
10.0000 mL | INTRAMUSCULAR | Status: DC | PRN
  Administered 2013-10-24: 10 mL via INTRAVENOUS
  Filled 2013-10-24: qty 10

## 2013-10-24 MED ORDER — HEPARIN SOD (PORK) LOCK FLUSH 100 UNIT/ML IV SOLN
500.0000 [IU] | Freq: Once | INTRAVENOUS | Status: AC
  Administered 2013-10-24: 500 [IU] via INTRAVENOUS
  Filled 2013-10-24: qty 5

## 2013-10-24 NOTE — Progress Notes (Signed)
Teresa Morgan presented for Portacath access and flush. Proper placement of portacath confirmed by CXR. Portacath located in the L chest wall accessed with 20G huber needle.  SITE ASSESSMENT:WNL Good blood return.  Portacath flushed with 20ml NS and 500U/52ml Heparin per protocol and needle removed intact. Procedure without incident. Patient tolerated procedure well.

## 2013-10-24 NOTE — Patient Instructions (Signed)
Implanted Port Instructions  An implanted port is a central line that has a round shape and is placed under the skin. It is used for long-term IV (intravenous) access for:  · Medicine.  · Fluids.  · Liquid nutrition, such as TPN (total parenteral nutrition).  · Blood samples.  Ports can be placed:  · In the chest area just below the collarbone (this is the most common place.)  · In the arms.  · In the belly (abdomen) area.  · In the legs.  PARTS OF THE PORT  A port has 2 main parts:  · The reservoir. The reservoir is round, disc-shaped, and will be a small, raised area under your skin.  · The reservoir is the part where a needle is inserted (accessed) to either give medicines or to draw blood.  · The catheter. The catheter is a long, slender tube that extends from the reservoir. The catheter is placed into a large vein.  · Medicine that is inserted into the reservoir goes into the catheter and then into the vein.  INSERTION OF THE PORT  · The port is surgically placed in either an operating room or in a procedural area (interventional radiology).  · Medicine may be given to help you relax during the procedure.  · The skin where the port will be inserted is numbed (local anesthetic).  · 1 or 2 small cuts (incisions) will be made in the skin to insert the port.  · The port can be used after it has been inserted.  INCISION SITE CARE  · The incision site may have small adhesive strips on it. This helps keep the incision site closed. Sometimes, no adhesive strips are placed. Instead of adhesive strips, a special kind of surgical glue is used to keep the incision closed.  · If adhesive strips were placed on the incision sites, do not take them off. They will fall off on their own.  · The incision site may be sore for 1 to 2 days. Pain medicine can help.  · Do not get the incision site wet. Bathe or shower as directed by your caregiver.  · The incision site should heal in 5 to 7 days. A small scar may form after the  incision has healed.  ACCESSING THE PORT  Special steps must be taken to access the port:  · Before the port is accessed, a numbing cream can be placed on the skin. This helps numb the skin over the port site.  · A sterile technique is used to access the port.  · The port is accessed with a needle. Only "non-coring" port needles should be used to access the port. Once the port is accessed, a blood return should be checked. This helps ensure the port is in the vein and is not clogged (clotted).  · If your caregiver believes your port should remain accessed, a clear (transparent) bandage will be placed over the needle site. The bandage and needle will need to be changed every week or as directed by your caregiver.  · Keep the bandage covering the needle clean and dry. Do not get it wet. Follow your caregiver's instructions on how to take a shower or bath when the port is accessed.  · If your port does not need to stay accessed, no bandage is needed over the port.  FLUSHING THE PORT  Flushing the port keeps it from getting clogged. How often the port is flushed depends on:  · If a   constant infusion is running. If a constant infusion is running, the port may not need to be flushed.  · If intermittent medicines are given.  · If the port is not being used.  For intermittent medicines:  · The port will need to be flushed:  · After medicines have been given.  · After blood has been drawn.  · As part of routine maintenance.  · A port is normally flushed with:  · Normal saline.  · Heparin.  · Follow your caregiver's advice on how often, how much, and the type of flush to use on your port.  IMPORTANT PORT INFORMATION  · Tell your caregiver if you are allergic to heparin.  · After your port is placed, you will get a manufacturer's information card. The card has information about your port. Keep this card with you at all times.  · There are many types of ports available. Know what kind of port you have.  · In case of an  emergency, it may be helpful to wear a medical alert bracelet. This can help alert health care workers that you have a port.  · The port can stay in for as long as your caregiver believes it is necessary.  · When it is time for the port to come out, surgery will be done to remove it. The surgery will be similar to how the port was put in.  · If you are in the hospital or clinic:  · Your port will be taken care of and flushed by a nurse.  · If you are at home:  · A home health care nurse may give medicines and take care of the port.  · You or a family member can get special training and directions for giving medicine and taking care of the port at home.  SEEK IMMEDIATE MEDICAL CARE IF:   · Your port does not flush or you are unable to get a blood return.  · New drainage or pus is coming from the incision.  · A bad smell is coming from the incision site.  · You develop swelling or increased redness at the incision site.  · You develop increased swelling or pain at the port site.  · You develop swelling or pain in the surrounding skin near the port.  · You have an oral temperature above 102° F (38.9° C), not controlled by medicine.  MAKE SURE YOU:   · Understand these instructions.  · Will watch your condition.  · Will get help right away if you are not doing well or get worse.  Document Released: 10/31/2005 Document Revised: 01/23/2012 Document Reviewed: 01/22/2009  [employer]® Patient Information ©2014 [employer].

## 2013-11-01 ENCOUNTER — Other Ambulatory Visit: Payer: Self-pay | Admitting: *Deleted

## 2013-11-01 DIAGNOSIS — C180 Malignant neoplasm of cecum: Secondary | ICD-10-CM

## 2013-11-01 DIAGNOSIS — G629 Polyneuropathy, unspecified: Secondary | ICD-10-CM

## 2013-11-01 MED ORDER — OXYCODONE-ACETAMINOPHEN 10-325 MG PO TABS: ORAL_TABLET | ORAL | Status: DC

## 2013-11-26 ENCOUNTER — Encounter: Payer: Self-pay | Admitting: Critical Care Medicine

## 2013-11-26 ENCOUNTER — Ambulatory Visit (INDEPENDENT_AMBULATORY_CARE_PROVIDER_SITE_OTHER): Payer: 59 | Admitting: Critical Care Medicine

## 2013-11-26 VITALS — BP 118/62 | HR 71 | Temp 97.1°F | Ht 69.0 in | Wt 156.0 lb

## 2013-11-26 DIAGNOSIS — J439 Emphysema, unspecified: Secondary | ICD-10-CM

## 2013-11-26 DIAGNOSIS — J438 Other emphysema: Secondary | ICD-10-CM

## 2013-11-26 MED ORDER — FLUTICASONE FUROATE-VILANTEROL 100-25 MCG/INH IN AEPB: INHALATION_SPRAY | RESPIRATORY_TRACT | Status: DC

## 2013-11-26 NOTE — Assessment & Plan Note (Signed)
Gold stage B. COPD with primary emphysematous component Plan Maintain inhaled medications as prescribed Return 6 month

## 2013-11-26 NOTE — Patient Instructions (Signed)
Stay on Breo daily Return 6 months

## 2013-11-26 NOTE — Progress Notes (Signed)
Subjective:    Patient ID: Teresa Morgan, female    DOB: 21-Feb-1956, 58 y.o.   MRN: [id number]  HPI 11/26/2013 Chief Complaint  Patient presents with  . Follow-up    6 mth f/u - SOB about the same - Denies cough , wheezing or chest tightness   No additional Freon exposure. Dyspnea at baseline, no cough Pt denies any significant sore throat, nasal congestion or excess secretions, fever, chills, sweats, unintended weight loss, pleurtic or exertional chest pain, orthopnea PND, or leg swelling Pt denies any increase in rescue therapy over baseline, denies waking up needing it or having any early am or nocturnal exacerbations of coughing/wheezing/or dyspnea. Pt also denies any obvious fluctuation in symptoms with  weather or environmental change or other alleviating or aggravating factors   Past Medical History  Diagnosis Date  . Anemia   . Anxiety   . Arthritis   . Depression   . GERD (gastroesophageal reflux disease)   . Ulcer   . Colon cancer   . COPD with emphysema 06/18/2012     Family History  Problem Relation Age of Onset  . Diabetes Mother   . Colon cancer Cousin      History   Social History  . Marital Status: Married    Spouse Name: N/A    Number of Children: 3  . 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 Smoking status: Former Smoker -- 1.00 packs/day for 25 years    Types: Cigarettes    Quit date: 06/08/2005  . Smokeless tobacco: Never Used  . Alcohol Use: 0.0 oz/week     Comment: social  . Drug Use: No  . Sexual Activity: Not on file   Other Topics Concern  . 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 Aspirin     Upset stomach  . Cymbalta [Duloxetine Hcl] Swelling    Swollen lips   . Prozac [Fluoxetine Hcl] Swelling    Lip swelling  . Wellbutrin  [Bupropion] Swelling    Lip swelling     Outpatient Prescriptions Prior to Visit  Medication Sig Dispense Refill  . ALPRAZolam (XANAX) 1 MG tablet Take 1 tablet (1 mg total) by mouth at bedtime as needed.  30 tablet  3  . Calcium-Vitamin D 600-125 MG-UNIT TABS Take by mouth daily.        . cyclobenzaprine (FLEXERIL) 5 MG tablet Take 1 tablet (5 mg total) by mouth 2 (two) times daily as needed for muscle spasms.  25 tablet  0  . gabapentin (NEURONTIN) 600 MG tablet Take 1 tablet (600 mg total) by mouth 4 (four) times daily.  120 tablet  3  . lansoprazole (PREVACID) 30 MG capsule Take 1 capsule (30 mg total) by mouth daily as needed.  30 capsule  3  . lidocaine-prilocaine (EMLA) cream APPLY TO PORT SITE 1 HOUR PRIOR TO CHEMO  30 g  4  . LORazepam (ATIVAN) 0.5 MG tablet Take 1 tablet (0.5 mg total) by mouth 2 (two) times daily as needed for anxiety.  30 tablet  0  . Multiple Vitamin (MULTIVITAMIN) tablet Take 1 tablet by mouth daily.        Marland Kitchen oxyCODONE-acetaminophen (PERCOCET) 10-325 MG  per tablet 1 - 2 tabs every 4 hours as needed for pain  120 tablet  0  . TURMERIC PO Take by mouth daily.       . vitamin E 200 UNIT capsule Take 200 Units by mouth daily.        . Vortioxetine HBr (BRINTELLIX) 5 MG TABS Take 1 tablet by mouth 2 (two) times daily.  60 tablet  2  . BREO ELLIPTA 100-25 MCG/INH AEPB INHALE 1 PUFF BY MOUTH EVERY DAY  60 each  3  . Phenyleph-Promethazine-Cod 5-6.25-10 MG/5ML SYRP Take 5 mLs by mouth every 4 (four) hours as needed.  120 mL  0  . zolpidem (AMBIEN) 10 MG tablet Take 1 tablet (10 mg total) by mouth at bedtime as needed for sleep.  30 tablet  0   No facility-administered medications prior to visit.     Review of Systems  Constitutional: Negative for chills, diaphoresis, activity change, appetite change, fatigue and unexpected weight change.  HENT: Negative for congestion, dental problem, ear discharge, facial swelling, hearing loss, mouth sores, nosebleeds, postnasal  drip, sinus pressure, sneezing, tinnitus, trouble swallowing and voice change.   Eyes: Negative for photophobia, discharge, itching and visual disturbance.  Respiratory: Negative for apnea, cough, choking, chest tightness and stridor.   Cardiovascular: Negative for palpitations.  Gastrointestinal: Positive for constipation. Negative for nausea, blood in stool and abdominal distention.  Genitourinary: Negative for dysuria, urgency, frequency, hematuria, flank pain, decreased urine volume and difficulty urinating.  Musculoskeletal: Positive for back pain. Negative for arthralgias, gait problem, joint swelling, myalgias and neck stiffness.  Skin: Negative for color change and pallor.  Neurological: Negative for dizziness, tremors, seizures, syncope, speech difficulty, weakness, light-headedness and numbness.  Hematological: Negative for adenopathy. Bruises/bleeds easily.  Psychiatric/Behavioral: Negative for confusion, sleep disturbance and agitation. The patient is nervous/anxious.        Objective:   Physical Exam  Filed Vitals:   11/26/13 1349  BP: 118/62  Pulse: 71  Temp: 97.1 F (36.2 C)  TempSrc: Oral  Height: 5\' 9"  (1.753 m)  Weight: 156 lb (70.761 kg)  SpO2: 96%    Gen: Pleasant, well-nourished, in no distress,  normal affect  ENT: No lesions,  mouth clear,  oropharynx clear, no postnasal drip  Neck: No JVD, no TMG, no carotid bruits  Lungs: No use of accessory muscles, no dullness to percussion,distant BS  Cardiovascular: RRR, heart sounds normal, no murmur or gallops, no peripheral edema  Abdomen: soft and NT, no HSM,  BS normal  Musculoskeletal: No deformities, no cyanosis or clubbing  Neuro: alert, non focal  Skin: Warm, no lesions or rashes     Assessment & Plan:   COPD with emphysema, Gold B Gold stage B. COPD with primary emphysematous component Plan Maintain inhaled medications as prescribed Return 6 month   maintain Breo daily  Updated  Medication List Outpatient Encounter Prescriptions as of 11/26/2013  Medication Sig  . ALPRAZolam (XANAX) 1 MG tablet Take 1 tablet (1 mg total) by mouth at bedtime as needed.  . Calcium-Vitamin D 600-125 MG-UNIT TABS Take by mouth daily.    . cyclobenzaprine (FLEXERIL) 5 MG tablet Take 1 tablet (5 mg total) by mouth 2 (two) times daily as needed for muscle spasms.  . Fluticasone Furoate-Vilanterol (BREO ELLIPTA) 100-25 MCG/INH AEPB INHALE 1 PUFF BY MOUTH EVERY DAY  . gabapentin (NEURONTIN) 600 MG tablet Take 1 tablet (600 mg total) by mouth 4 (four) times daily.  . lansoprazole (PREVACID) 30 MG capsule  Take 1 capsule (30 mg total) by mouth daily as needed.  . lidocaine-prilocaine (EMLA) cream APPLY TO PORT SITE 1 HOUR PRIOR TO CHEMO  . LORazepam (ATIVAN) 0.5 MG tablet Take 1 tablet (0.5 mg total) by mouth 2 (two) times daily as needed for anxiety.  . Multiple Vitamin (MULTIVITAMIN) tablet Take 1 tablet by mouth daily.    Marland Kitchen oxyCODONE-acetaminophen (PERCOCET) 10-325 MG per tablet 1 - 2 tabs every 4 hours as needed for pain  . TURMERIC PO Take by mouth daily.   . vitamin E 200 UNIT capsule Take 200 Units by mouth daily.    . Vortioxetine HBr (BRINTELLIX) 5 MG TABS Take 1 tablet by mouth 2 (two) times daily.  . [DISCONTINUED] BREO ELLIPTA 100-25 MCG/INH AEPB INHALE 1 PUFF BY MOUTH EVERY DAY  . [DISCONTINUED] Phenyleph-Promethazine-Cod 5-6.25-10 MG/5ML SYRP Take 5 mLs by mouth every 4 (four) hours as needed.  . [DISCONTINUED] zolpidem (AMBIEN) 10 MG tablet Take 1 tablet (10 mg total) by mouth at bedtime as needed for sleep.

## 2013-12-05 ENCOUNTER — Ambulatory Visit (HOSPITAL_BASED_OUTPATIENT_CLINIC_OR_DEPARTMENT_OTHER): Payer: 59

## 2013-12-05 VITALS — BP 124/78 | HR 78 | Temp 96.6°F | Resp 16

## 2013-12-05 DIAGNOSIS — Z452 Encounter for adjustment and management of vascular access device: Secondary | ICD-10-CM

## 2013-12-05 DIAGNOSIS — C180 Malignant neoplasm of cecum: Secondary | ICD-10-CM

## 2013-12-05 MED ORDER — HEPARIN SOD (PORK) LOCK FLUSH 100 UNIT/ML IV SOLN
500.0000 [IU] | Freq: Once | INTRAVENOUS | Status: AC
  Administered 2013-12-05: 500 [IU] via INTRAVENOUS
  Filled 2013-12-05: qty 5

## 2013-12-05 MED ORDER — SODIUM CHLORIDE 0.9 % IJ SOLN
10.0000 mL | INTRAMUSCULAR | Status: DC | PRN
  Administered 2013-12-05: 10 mL via INTRAVENOUS
  Filled 2013-12-05: qty 10

## 2013-12-16 ENCOUNTER — Other Ambulatory Visit: Payer: Self-pay | Admitting: *Deleted

## 2013-12-16 DIAGNOSIS — C180 Malignant neoplasm of cecum: Secondary | ICD-10-CM

## 2013-12-16 DIAGNOSIS — G629 Polyneuropathy, unspecified: Secondary | ICD-10-CM

## 2013-12-16 MED ORDER — OXYCODONE-ACETAMINOPHEN 10-325 MG PO TABS: ORAL_TABLET | ORAL | Status: DC

## 2013-12-23 ENCOUNTER — Ambulatory Visit (INDEPENDENT_AMBULATORY_CARE_PROVIDER_SITE_OTHER): Payer: 59 | Admitting: Critical Care Medicine

## 2013-12-23 ENCOUNTER — Encounter: Payer: Self-pay | Admitting: Critical Care Medicine

## 2013-12-23 VITALS — BP 122/84 | HR 69 | Temp 97.9°F | Ht 69.0 in | Wt 159.2 lb

## 2013-12-23 DIAGNOSIS — J438 Other emphysema: Secondary | ICD-10-CM

## 2013-12-23 DIAGNOSIS — J439 Emphysema, unspecified: Secondary | ICD-10-CM

## 2013-12-23 MED ORDER — HYDROCODONE-HOMATROPINE 5-1.5 MG/5ML PO SYRP: 5.0000 mL | ORAL_SOLUTION | Freq: Four times a day (QID) | ORAL | Status: DC | PRN

## 2013-12-23 MED ORDER — AMOXICILLIN-POT CLAVULANATE 875-125 MG PO TABS: 1.0000 | ORAL_TABLET | Freq: Two times a day (BID) | ORAL | Status: DC

## 2013-12-23 MED ORDER — METHYLPREDNISOLONE ACETATE 80 MG/ML IJ SUSP
120.0000 mg | Freq: Once | INTRAMUSCULAR | Status: AC
  Administered 2013-12-23: 120 mg via INTRAMUSCULAR

## 2013-12-23 NOTE — Progress Notes (Signed)
Subjective:    Patient ID: Teresa Morgan, female    DOB: 06-04-1956, 58 y.o.   MRN: [id number]  HPI 12/23/2013 Chief Complaint  Patient presents with  . Acute Visit    Prod cough with small amount of clear mucus, some wheezing, and chest tightness x 5-6 days.  Notes more cough, clear mucus. Notes more wheeze and tightness and body aches. No real fever. Symptoms for one week Notes some sinus drainage .  Notes some sinus pressure.   Past Medical History  Diagnosis Date  . Anemia   . Anxiety   . Arthritis   . Depression   . GERD (gastroesophageal reflux disease)   . Ulcer   . Colon cancer   . COPD with emphysema 06/18/2012     Family History  Problem Relation Age of Onset  . Diabetes Mother   . Colon cancer Cousin      History   Social History  . Marital Status: Married    Spouse Name: N/A    Number of Children: 3  . 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 Smoking status: Former Smoker -- 1.00 packs/day for 25 years    Types: Cigarettes    Quit date: 06/08/2005  . Smokeless tobacco: Never Used  . Alcohol Use: 0.0 oz/week     Comment: social  . Drug Use: No  . Sexual Activity: Not on file   Other Topics Concern  . 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 Aspirin     Upset stomach  . Augmentin [Amoxicillin-Pot Clavulanate]     Upset stomach  . Cymbalta [Duloxetine Hcl] Swelling    Swollen lips   . Prozac [Fluoxetine Hcl] Swelling    Lip swelling  . Wellbutrin [Bupropion] Swelling    Lip swelling     Outpatient Prescriptions Prior to Visit  Medication Sig Dispense Refill  . ALPRAZolam (XANAX) 1 MG tablet Take 1 tablet (1 mg total) by mouth at bedtime as needed.  30 tablet  3  . Calcium-Vitamin D 600-125 MG-UNIT TABS Take by mouth daily.        .  Fluticasone Furoate-Vilanterol (BREO ELLIPTA) 100-25 MCG/INH AEPB INHALE 1 PUFF BY MOUTH EVERY DAY  60 each  11  . gabapentin (NEURONTIN) 600 MG tablet Take 1 tablet (600 mg total) by mouth 4 (four) times daily.  120 tablet  3  . lansoprazole (PREVACID) 30 MG capsule Take 1 capsule (30 mg total) by mouth daily as needed.  30 capsule  3  . LORazepam (ATIVAN) 0.5 MG tablet Take 1 tablet (0.5 mg total) by mouth 2 (two) times daily as needed for anxiety.  30 tablet  0  . Multiple Vitamin (MULTIVITAMIN) tablet Take 1 tablet by mouth daily.        Marland Kitchen oxyCODONE-acetaminophen (PERCOCET) 10-325 MG per tablet 1 - 2 tabs every 4 hours as needed for pain  120 tablet  0  . TURMERIC PO Take by mouth daily.       . vitamin E 200 UNIT capsule Take 200 Units by mouth daily.        . Vortioxetine HBr (BRINTELLIX) 5 MG TABS Take 1 tablet by mouth 2 (two) times daily.  60 tablet  2  . cyclobenzaprine (FLEXERIL) 5 MG tablet Take 1 tablet (5 mg total) by mouth 2 (two) times daily as needed for muscle spasms.  25 tablet  0  . lidocaine-prilocaine (EMLA) cream APPLY TO PORT SITE 1 HOUR PRIOR TO CHEMO  30 g  4   No facility-administered medications prior to visit.     Review of Systems  Constitutional: Negative for chills, diaphoresis, activity change, appetite change, fatigue and unexpected weight change.  HENT: Negative for congestion, dental problem, ear discharge, facial swelling, hearing loss, mouth sores, nosebleeds, postnasal drip, sinus pressure, sneezing, tinnitus, trouble swallowing and voice change.   Eyes: Negative for photophobia, discharge, itching and visual disturbance.  Respiratory: Negative for apnea, cough, choking, chest tightness and stridor.   Cardiovascular: Negative for palpitations.  Gastrointestinal: Positive for constipation. Negative for nausea, blood in stool and abdominal distention.  Genitourinary: Negative for dysuria, urgency, frequency, hematuria, flank pain, decreased urine volume and  difficulty urinating.  Musculoskeletal: Positive for back pain. Negative for arthralgias, gait problem, joint swelling, myalgias and neck stiffness.  Skin: Negative for color change and pallor.  Neurological: Negative for dizziness, tremors, seizures, syncope, speech difficulty, weakness, light-headedness and numbness.  Hematological: Negative for adenopathy. Bruises/bleeds easily.  Psychiatric/Behavioral: Negative for confusion, sleep disturbance and agitation. The patient is nervous/anxious.        Objective:   Physical Exam  Filed Vitals:   12/23/13 1631  BP: 122/84  Pulse: 69  Temp: 97.9 F (36.6 C)  TempSrc: Oral  Height: 5\' 9"  (1.753 m)  Weight: 159 lb 3.2 oz (72.213 kg)  SpO2: 98%    Gen: Pleasant, well-nourished, in no distress,  normal affect  ENT: No lesions,  mouth clear,  oropharynx clear, no postnasal drip  Neck: No JVD, no TMG, no carotid bruits  Lungs: No use of accessory muscles, no dullness to percussion,distant BS  Cardiovascular: RRR, heart sounds normal, no murmur or gallops, no peripheral edema  Abdomen: soft and NT, no HSM,  BS normal  Musculoskeletal: No deformities, no cyanosis or clubbing  Neuro: alert, non focal  Skin: Warm, no lesions or rashes     Assessment & Plan:   COPD with emphysema, Gold B Gold C Copd with exacerbation Plan Hycodan homotropine cough syrup was given Augmentin was Rx but pt called back 24hrs later and changed to azithromycin d/t adverse GI side effects with augmentin Depomedrol 120mg  IM was given Stay on Breo Return 2 months    maintain Breo daily  Updated Medication List Outpatient Encounter Prescriptions as of 12/23/2013  Medication Sig  . ALPRAZolam (XANAX) 1 MG tablet Take 1 tablet (1 mg total) by mouth at bedtime as needed.  . Calcium-Vitamin D 600-125 MG-UNIT TABS Take by mouth daily.    . Fluticasone Furoate-Vilanterol (BREO ELLIPTA) 100-25 MCG/INH AEPB INHALE 1 PUFF BY MOUTH EVERY DAY  . gabapentin  (NEURONTIN) 600 MG tablet Take 1 tablet (600 mg total) by mouth 4 (four) times daily.  . lansoprazole (PREVACID) 30 MG capsule Take 1 capsule (30 mg total) by mouth daily as needed.  Marland Kitchen LORazepam (ATIVAN) 0.5 MG tablet Take 1 tablet (0.5 mg total) by mouth 2 (two) times daily as needed for anxiety.  . Multiple Vitamin (MULTIVITAMIN) tablet Take 1 tablet by mouth daily.    Marland Kitchen oxyCODONE-acetaminophen (PERCOCET) 10-325 MG per tablet 1 - 2 tabs every 4 hours as needed for pain  . TURMERIC PO Take by mouth daily.   . vitamin E 200 UNIT  capsule Take 200 Units by mouth daily.    . Vortioxetine HBr (BRINTELLIX) 5 MG TABS Take 1 tablet by mouth 2 (two) times daily.  Marland Kitchen HYDROcodone-homatropine (HYCODAN) 5-1.5 MG/5ML syrup Take 5 mLs by mouth every 6 (six) hours as needed for cough.  . [DISCONTINUED] amoxicillin-clavulanate (AUGMENTIN) 875-125 MG per tablet Take 1 tablet by mouth 2 (two) times daily.  . [DISCONTINUED] cyclobenzaprine (FLEXERIL) 5 MG tablet Take 1 tablet (5 mg total) by mouth 2 (two) times daily as needed for muscle spasms.  . [DISCONTINUED] lidocaine-prilocaine (EMLA) cream APPLY TO PORT SITE 1 HOUR PRIOR TO CHEMO  . [EXPIRED] methylPREDNISolone acetate (DEPO-MEDROL) injection 120 mg

## 2013-12-23 NOTE — Patient Instructions (Signed)
Augmentin twice daily for 7days Hycodan homotropine cough syrup was given Depomedrol 120mg  IM was given Stay on Breo Return 2 months

## 2013-12-24 ENCOUNTER — Telehealth: Payer: Self-pay | Admitting: Critical Care Medicine

## 2013-12-24 MED ORDER — AZITHROMYCIN 250 MG PO TABS: ORAL_TABLET | ORAL | Status: DC

## 2013-12-24 NOTE — Telephone Encounter (Signed)
Dc augmentin Start azithromycin 250mg  Take two once then one daily until gone #6 Place augmentin on allergy list

## 2013-12-24 NOTE — Telephone Encounter (Signed)
Pt aware of recs and rx sent. Medication placed on allergy list. Nothing further needed

## 2013-12-24 NOTE — Telephone Encounter (Signed)
I called and spoke with pt. She reports the augmentin is making her stomach upset. She did not eat yogurt or take probiotic with it. She reports the [facility] does not do this to her. Please advise Dr. Joya Gaskins thanks  Allergies  Allergen Reactions  . Aspirin     Upset stomach  . Cymbalta [Duloxetine Hcl] Swelling    Swollen lips   . Prozac [Fluoxetine Hcl] Swelling    Lip swelling  . Wellbutrin [Bupropion] Swelling    Lip swelling

## 2013-12-24 NOTE — Assessment & Plan Note (Signed)
Gold C Copd with exacerbation Plan Hycodan homotropine cough syrup was given Augmentin was Rx but pt called back 24hrs later and changed to azithromycin d/t adverse GI side effects with augmentin Depomedrol 120mg  IM was given Stay on Breo Return 2 months

## 2014-01-16 ENCOUNTER — Ambulatory Visit (HOSPITAL_BASED_OUTPATIENT_CLINIC_OR_DEPARTMENT_OTHER): Payer: 59

## 2014-01-16 VITALS — BP 121/71 | HR 79 | Resp 20

## 2014-01-16 DIAGNOSIS — Z452 Encounter for adjustment and management of vascular access device: Secondary | ICD-10-CM

## 2014-01-16 DIAGNOSIS — C180 Malignant neoplasm of cecum: Secondary | ICD-10-CM

## 2014-01-16 DIAGNOSIS — C182 Malignant neoplasm of ascending colon: Secondary | ICD-10-CM

## 2014-01-16 MED ORDER — SODIUM CHLORIDE 0.9 % IJ SOLN
10.0000 mL | INTRAMUSCULAR | Status: DC | PRN
  Administered 2014-01-16: 10 mL via INTRAVENOUS
  Filled 2014-01-16: qty 10

## 2014-01-16 MED ORDER — HEPARIN SOD (PORK) LOCK FLUSH 100 UNIT/ML IV SOLN
500.0000 [IU] | Freq: Once | INTRAVENOUS | Status: AC
  Administered 2014-01-16: 500 [IU] via INTRAVENOUS
  Filled 2014-01-16: qty 5

## 2014-01-16 NOTE — Patient Instructions (Signed)

## 2014-01-20 ENCOUNTER — Ambulatory Visit (INDEPENDENT_AMBULATORY_CARE_PROVIDER_SITE_OTHER): Payer: 59 | Admitting: Family

## 2014-01-20 ENCOUNTER — Encounter: Payer: Self-pay | Admitting: Family

## 2014-01-20 VITALS — BP 106/70 | HR 71 | Temp 97.7°F | Resp 16 | Ht 67.5 in | Wt 152.0 lb

## 2014-01-20 DIAGNOSIS — F411 Generalized anxiety disorder: Secondary | ICD-10-CM

## 2014-01-20 DIAGNOSIS — J438 Other emphysema: Secondary | ICD-10-CM

## 2014-01-20 DIAGNOSIS — F3289 Other specified depressive episodes: Secondary | ICD-10-CM

## 2014-01-20 DIAGNOSIS — R1011 Right upper quadrant pain: Secondary | ICD-10-CM

## 2014-01-20 DIAGNOSIS — J439 Emphysema, unspecified: Secondary | ICD-10-CM

## 2014-01-20 DIAGNOSIS — R109 Unspecified abdominal pain: Secondary | ICD-10-CM | POA: Insufficient documentation

## 2014-01-20 DIAGNOSIS — F329 Major depressive disorder, single episode, unspecified: Secondary | ICD-10-CM

## 2014-01-20 DIAGNOSIS — F32A Depression, unspecified: Secondary | ICD-10-CM

## 2014-01-20 LAB — LIPASE: LIPASE: 23 U/L (ref 0–75)

## 2014-01-20 LAB — CBC WITH DIFFERENTIAL/PLATELET
BASOS PCT: 0 % (ref 0–1)
Basophils Absolute: 0 10*3/uL (ref 0.0–0.1)
Eosinophils Absolute: 0.1 10*3/uL (ref 0.0–0.7)
Eosinophils Relative: 2 % (ref 0–5)
HCT: 40 % (ref 36.0–46.0)
HEMOGLOBIN: 13.7 g/dL (ref 12.0–15.0)
Lymphocytes Relative: 25 % (ref 12–46)
Lymphs Abs: 1.5 10*3/uL (ref 0.7–4.0)
MCH: 23.3 pg — ABNORMAL LOW (ref 26.0–34.0)
MCHC: 34.3 g/dL (ref 30.0–36.0)
MCV: 67.9 fL — ABNORMAL LOW (ref 78.0–100.0)
MONOS PCT: 6 % (ref 3–12)
Monocytes Absolute: 0.4 10*3/uL (ref 0.1–1.0)
NEUTROS ABS: 4 10*3/uL (ref 1.7–7.7)
NEUTROS PCT: 67 % (ref 43–77)
Platelets: 155 10*3/uL (ref 150–400)
RBC: 5.89 MIL/uL — AB (ref 3.87–5.11)
RDW: 15.9 % — ABNORMAL HIGH (ref 11.5–15.5)
WBC: 5.9 10*3/uL (ref 4.0–10.5)

## 2014-01-20 LAB — HEPATIC FUNCTION PANEL
ALT: 17 U/L (ref 0–35)
AST: 16 U/L (ref 0–37)
Albumin: 4.5 g/dL (ref 3.5–5.2)
Alkaline Phosphatase: 62 U/L (ref 39–117)
BILIRUBIN DIRECT: 0.1 mg/dL (ref 0.0–0.3)
BILIRUBIN TOTAL: 0.5 mg/dL (ref 0.2–1.2)
Indirect Bilirubin: 0.4 mg/dL (ref 0.2–1.2)
Total Protein: 6.9 g/dL (ref 6.0–8.3)

## 2014-01-20 MED ORDER — PANTOPRAZOLE SODIUM 40 MG PO TBEC: 40.0000 mg | DELAYED_RELEASE_TABLET | Freq: Every day | ORAL | Status: DC

## 2014-01-20 NOTE — Assessment & Plan Note (Signed)
Stable on current meds. Followed by pulmonology.

## 2014-01-20 NOTE — Progress Notes (Signed)
Pre visit review using our clinic review tool, if applicable. No additional management support is needed unless otherwise documented below in the visit note. 

## 2014-01-20 NOTE — Patient Instructions (Addendum)
Complete lab work prior to leaving today. Please complete ultrasound of gall bladder on first floor. Start Protonix once daily for reflux. Call if abdominal pain worsens, or if it does not improve.  Follow up in 1 month.

## 2014-01-20 NOTE — Assessment & Plan Note (Addendum)
Tender to RUQ and epigastric region. Suspect reflux and will start protonix daily. Also obtain ultrasound of gallbladder, CBC, Hepatic Panel, and Lipase to rule out inflammation and stones. If no improvement or if symptoms worsen, consider referral to GI.

## 2014-01-20 NOTE — Assessment & Plan Note (Signed)
Clinically stable. Management per psychiatry.

## 2014-01-20 NOTE — Progress Notes (Signed)
Subjective:    Patient ID: Teresa Morgan, female    DOB: 05/06/1956, 58 y.o.   MRN: [id number]  HPI Teresa Morgan is a 58 year old female who presents today for follow up.  Depression) Started on Brintellix in November of 2014. Patient reports a decrease in tearfulness and depressed mood. Denies SI/HI. Reports decreased stress at work. Patient is managed at [facility], patient has upcoming appointment in May 2015. Patient takes Lorazepam daily at work and alprazolam at night.  COPD) Patient managed with Dr. Joya Gaskins, reports intermittent SOB with heavy exertion, but otherwise denies. Denies chest pain.   Abdominal Pain) Patient report epigastric pain with burning, patient has been taking Zantac and Mylanta with relief.  Last bowel movement today. Denies nausea, vomiting, diarrhea.   Review of Systems  Constitutional: Negative for fever and chills.  Respiratory: Negative for shortness of breath.   Cardiovascular: Negative for chest pain.  Gastrointestinal: Positive for abdominal pain. Negative for nausea, vomiting, diarrhea, blood in stool and abdominal distention.  Genitourinary: Negative for difficulty urinating.  Psychiatric/Behavioral:       Patient denies SI/HI and reports Brintellix to be helping.   Past Medical History  Diagnosis Date  . Anemia   . Anxiety   . Arthritis   . Depression   . GERD (gastroesophageal reflux disease)   . Ulcer   . Colon cancer   . COPD with emphysema 06/18/2012    History   Social History  . Marital Status: Married    Spouse Name: N/A    Number of Children: 3  . 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 Smoking status: Former Smoker -- 1.00 packs/day for 25 years    Types: Cigarettes    Quit date: 06/08/2005  . Smokeless tobacco: Never Used  . Alcohol Use: 0.0 oz/week     Comment: social  . Drug Use: No  . Sexual Activity: Not on file   Other Topics Concern  .  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 Heel spurs    . Hemrrhoids    . Plantar fascia surgery  2009  . Colon resection  2012    for colon cancer  . Colon surgery  07/07/11    lap ileocolectomy  . Tubal ligation  07/1980  . Appendectomy  07/07/11  . Portacath      Family History  Problem Relation Age of Onset  . Diabetes Mother   . Colon cancer Cousin     Allergies  Allergen Reactions  . Aspirin     Upset stomach  . Augmentin [Amoxicillin-Pot Clavulanate]     Upset stomach  . Cymbalta [Duloxetine Hcl] Swelling    Swollen lips   . Prozac [Fluoxetine Hcl] Swelling    Lip swelling  . Wellbutrin [Bupropion] Swelling    Lip swelling    Current Outpatient Prescriptions on File Prior to Visit  Medication Sig Dispense Refill  . ALPRAZolam (XANAX) 1 MG tablet Take 1 tablet (1 mg total) by mouth at bedtime as needed.  30 tablet  3  . Calcium-Vitamin D 600-125 MG-UNIT TABS Take by mouth daily.        . Fluticasone Furoate-Vilanterol (BREO ELLIPTA)  100-25 MCG/INH AEPB INHALE 1 PUFF BY MOUTH EVERY DAY  60 each  11  . gabapentin (NEURONTIN) 600 MG tablet Take 1 tablet (600 mg total) by mouth 4 (four) times daily.  120 tablet  3  . LORazepam (ATIVAN) 0.5 MG tablet Take 1 tablet (0.5 mg total) by mouth 2 (two) times daily as needed for anxiety.  30 tablet  0  . Multiple Vitamin (MULTIVITAMIN) tablet Take 1 tablet by mouth daily.        Marland Kitchen oxyCODONE-acetaminophen (PERCOCET) 10-325 MG per tablet 1 - 2 tabs every 4 hours as needed for pain  120 tablet  0  . TURMERIC PO Take by mouth daily.       . vitamin E 200 UNIT capsule Take 200 Units by mouth daily.        . Vortioxetine HBr (BRINTELLIX) 5 MG TABS Take 1 tablet by mouth 2 (two) times daily.  60 tablet  2   No current facility-administered medications on file prior to visit.     BP 106/70  Pulse 71  Temp(Src) 97.7 F (36.5 C) (Oral)  Resp 16  Ht 5' 7.5" (1.715 m)  Wt 152 lb 0.6 oz (68.965 kg)  BMI 23.45 kg/m2  SpO2 98%       Objective:   Physical Exam  Constitutional: She is oriented to person, place, and time. She appears well-nourished.  Neck: Neck supple.  Cardiovascular: Normal rate, regular rhythm, S1 normal and S2 normal.   Pulmonary/Chest: Effort normal and breath sounds normal. No respiratory distress. She has no wheezes.  Abdominal: Soft. Normal appearance and bowel sounds are normal. There is no hepatomegaly. There is tenderness in the right upper quadrant and epigastric area. There is no rebound.    Lymphadenopathy:    She has no cervical adenopathy.  Neurological: She is alert and oriented to person, place, and time.  Skin: Skin is warm and dry.  Psychiatric: She has a normal mood and affect.          Assessment & Plan:  I have personally seen and examined patient and agree with Teresa Friendly NP-student's assessment and plan.

## 2014-01-21 ENCOUNTER — Ambulatory Visit (HOSPITAL_BASED_OUTPATIENT_CLINIC_OR_DEPARTMENT_OTHER)
Admission: RE | Admit: 2014-01-21 | Discharge: 2014-01-21 | Disposition: A | Discharge status: Home / Self Care | Payer: 59 | Source: Ambulatory Visit | Attending: Family | Admitting: Family

## 2014-01-21 ENCOUNTER — Telehealth: Payer: Self-pay | Admitting: Family

## 2014-01-21 DIAGNOSIS — R1011 Right upper quadrant pain: Secondary | ICD-10-CM | POA: Insufficient documentation

## 2014-01-21 NOTE — Telephone Encounter (Signed)
Relevant patient education assigned to patient using Emmi. ° °

## 2014-01-23 ENCOUNTER — Telehealth: Payer: Self-pay | Admitting: Family

## 2014-01-23 DIAGNOSIS — R1013 Epigastric pain: Secondary | ICD-10-CM

## 2014-01-23 NOTE — Telephone Encounter (Signed)
Please call pt and let her know that I reviewed her abdominal ultrasound and it is negative for gallstones.  It does show some benign polyps in the gallbladder.  I would like to set her up with GI for further evaluation of her abdominal pain. Does she already have a gastroenterologist?  If so, please add to consult below, otherwise if not, will refer to

## 2014-01-24 ENCOUNTER — Other Ambulatory Visit: Payer: Self-pay | Admitting: *Deleted

## 2014-01-24 DIAGNOSIS — C180 Malignant neoplasm of cecum: Secondary | ICD-10-CM

## 2014-01-24 DIAGNOSIS — G629 Polyneuropathy, unspecified: Secondary | ICD-10-CM

## 2014-01-24 MED ORDER — OXYCODONE-ACETAMINOPHEN 10-325 MG PO TABS: ORAL_TABLET | ORAL | Status: DC

## 2014-01-27 NOTE — Telephone Encounter (Signed)
Left message on voicemail to return my call.  

## 2014-01-28 NOTE — Telephone Encounter (Signed)
Left message on home and cell#s to return my call. 

## 2014-01-31 NOTE — Telephone Encounter (Signed)
Left message on cell# to return my call and mailed letter.

## 2014-02-03 ENCOUNTER — Encounter: Payer: Self-pay | Admitting: Nurse Practitioner

## 2014-02-03 NOTE — Telephone Encounter (Signed)
Notified pt and she voices understanding. Informed her of GI appt on 02/07/14.

## 2014-02-03 NOTE — Telephone Encounter (Signed)
Pt called back and left message to call her cell#.  Left detailed message and to call re: GI referral below.

## 2014-02-07 ENCOUNTER — Ambulatory Visit (INDEPENDENT_AMBULATORY_CARE_PROVIDER_SITE_OTHER): Payer: 59 | Admitting: Nurse Practitioner

## 2014-02-07 ENCOUNTER — Encounter: Payer: Self-pay | Admitting: Nurse Practitioner

## 2014-02-07 VITALS — BP 100/64 | HR 68 | Ht 67.25 in | Wt 154.1 lb

## 2014-02-07 DIAGNOSIS — R1013 Epigastric pain: Secondary | ICD-10-CM | POA: Insufficient documentation

## 2014-02-07 DIAGNOSIS — C180 Malignant neoplasm of cecum: Secondary | ICD-10-CM

## 2014-02-07 DIAGNOSIS — R109 Unspecified abdominal pain: Secondary | ICD-10-CM

## 2014-02-07 NOTE — Patient Instructions (Signed)
Continue the Protonix  40 mg, 1 tab daily.   We have given you a note for work.  You have been scheduled for an endoscopy with propofol. Please follow written instructions given to you at your visit today. If you use inhalers (even only as needed), please bring them with you on the day of your procedure. Your physician has requested that you go to www.startemmi.com and enter the access code given to you at your visit today. This web site gives a general overview about your procedure. However, you should still follow specific instructions given to you by our office regarding your preparation for the procedure.

## 2014-02-10 ENCOUNTER — Encounter: Payer: Self-pay | Admitting: Nurse Practitioner

## 2014-02-10 NOTE — Progress Notes (Signed)
HPI :  Patient is a 58 year old female referred by PCP for evaluation of abdominal pain. 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 Patient describes chronic, intermittent epigastric burning, She recalls having this discomfort even when undergoing chemotherapy a couple of years back. Burning actually went away for a year or so, recently returned. No significant nausea. BMs okay.   Patient complains of right mid back/flank pain, isn't sure if related to epigastric pain or not but thinks back pain more musculoskeletal. Recent gallbladder ultrasound negative. CTscan with contrast of abdomen in December was negative. LFTs, lipase normal.    Past Medical History  Diagnosis Date  . Anemia   . Anxiety   . Arthritis   . Depression   . GERD (gastroesophageal reflux disease)   . Peptic ulcer   . Colon cancer   . COPD with emphysema 06/18/2012    Family History  Problem Relation Age of Onset  . Diabetes Mother   . Colon cancer Cousin    History  Substance Use Topics  . Smoking status: Former Smoker -- 1.00 packs/day for 25 years    Types: Cigarettes    Quit date: 06/08/2005  . Smokeless tobacco: Never Used  . Alcohol Use: 0.0 oz/week     Comment: social   Current Outpatient Prescriptions  Medication Sig Dispense Refill  . ALPRAZolam (XANAX) 1 MG tablet Take 1 tablet (1 mg total) by mouth at bedtime as needed.  30 tablet  3  . Calcium-Vitamin D 600-125 MG-UNIT TABS Take by mouth daily.        . Fluticasone Furoate-Vilanterol (BREO ELLIPTA) 100-25 MCG/INH AEPB INHALE 1 PUFF BY MOUTH EVERY DAY  60 each  11  . gabapentin (NEURONTIN) 600 MG tablet Take 1 tablet (600 mg total) by mouth 4 (four) times daily.  120 tablet  3  . LORazepam (ATIVAN) 0.5 MG tablet Take 1 tablet (0.5 mg total) by mouth 2 (two) times daily as needed for anxiety.  30 tablet  0  . Multiple Vitamin (MULTIVITAMIN) tablet Take 1 tablet by mouth daily.         Marland Kitchen oxyCODONE-acetaminophen (PERCOCET) 10-325 MG per tablet 1 - 2 tabs every 4 hours as needed for pain  120 tablet  0  . pantoprazole (PROTONIX) 40 MG tablet Take 1 tablet (40 mg total) by mouth daily.  30 tablet  3  . TURMERIC PO Take by mouth daily.       . vitamin E 200 UNIT capsule Take 200 Units by mouth daily.        . Vortioxetine HBr (BRINTELLIX) 5 MG TABS Take 1 tablet by mouth 2 (two) times daily.  60 tablet  2  . zaleplon (SONATA) 10 MG capsule Take 10 mg by mouth at bedtime as needed.       No current facility-administered medications for this visit.   Allergies  Allergen Reactions  . Aspirin     Upset stomach  . Augmentin [Amoxicillin-Pot Clavulanate]     Upset stomach  . Cymbalta [Duloxetine Hcl] Swelling    Swollen lips   . Prozac [Fluoxetine Hcl] Swelling    Lip swelling  . Wellbutrin [Bupropion] Swelling    Lip swelling    Review of Systems: All systems reviewed and negative except where noted in HPI.    US Abdomen Complete  01/21/2014   CLINICAL DATA:  Right upper abdominal pain. History of colon carcinoma, adenomyomatosis of  the gallbladder.  EXAM: COMPLETE ABDOMINAL ULTRASOUND  COMPARISON:  CT 10/17/2013 and earlier studies  FINDINGS: Gallbladder: Physiologically distended without stones, wall thickening, or pericholecystic fluid. Two echogenic foci in the non dependent aspect of the gallbladder wall demonstrate ring down artifact. Sonographer reports no sonographic Murphy's sign.  Common bile duct:  Normal in caliber, 45mm diameter.  Liver: Homogeneous in echotexture without focal lesion or intrahepatic bile duct dilatation.  IVC: Visualized segments unremarkable, portions obscured by overlying bowel gas.  Pancreas:  Negative  Spleen:  No focal lesion, craniocaudal 9.1cm in length.  Right Kidney:  No mass or hydronephrosis, 10.2cm in length.  Left Kidney:  No lesion or hydronephrosis, 10.6cm in length.  Abdominal aorta: Visualized segments unremarkable, portions  obscured by overlying bowel gas.  IMPRESSION: 1. No gallstones or acute abnormality. 2. Probable gallbladder wall adenomyomatosis as previously demonstrated.   Electronically Signed   By: Arne Cleveland M.D.   On: 01/21/2014 10:43    Physical Exam: BP 100/64  Pulse 68  Ht 5' 7.25" (1.708 m)  Wt 154 lb 2 oz (69.911 kg)  BMI 23.96 kg/m2 Constitutional: Pleasant,well-developed, female in no acute distress. HEENT: Normocephalic and atraumatic. Conjunctivae are normal. No scleral icterus. Neck supple.  Cardiovascular: Normal rate, regular rhythm.  Pulmonary/chest: Effort normal and breath sounds normal. No wheezing, rales or rhonchi. Abdominal: Soft, nondistended, nontender. Bowel sounds active throughout. There are no masses palpable. No hepatomegaly. Extremities: no edema Lymphadenopathy: No cervical adenopathy noted. Neurological: Alert and oriented to person place and time. Skin: Skin is warm and dry. No rashes noted. Psychiatric: Normal mood and affect. Behavior is normal.   ASSESSMENT AND PLAN:  54. 58 year old female with chronic, intermittent epigastric burning refractory to daily PPI. Abdominal imaging and labs unremarkable. For further evaluation patient will be scheduled for EGD. The benefits, risks, and potential complications of EGD with possible biopsies were discussed with the patient and she agrees to proceed.   2. Right mid back pain. Patient feels this may be musculoskeletal. Imaging and labs unremarkable.   3.  History of cecal cancer 2012 (Stage IIIB (T2,N2a, MO), s/p right hemicolectomy and chemotherapy. Followed by Dr. Marin Olp. Chest CTscan December 2014 revealed bilateral pulmonary nodules, some of these unchanged, some new and some have resolved compared to the prior exam. Oncology to get follow up scan in few months.

## 2014-02-17 ENCOUNTER — Other Ambulatory Visit: Payer: Self-pay | Admitting: Hematology & Oncology

## 2014-02-17 ENCOUNTER — Encounter: Payer: Self-pay | Admitting: Gastroenterology

## 2014-02-17 ENCOUNTER — Encounter: Payer: Self-pay | Admitting: Family

## 2014-02-17 ENCOUNTER — Ambulatory Visit (INDEPENDENT_AMBULATORY_CARE_PROVIDER_SITE_OTHER): Payer: 59 | Admitting: Family

## 2014-02-17 VITALS — BP 120/76 | HR 77 | Temp 97.9°F | Wt 155.0 lb

## 2014-02-17 DIAGNOSIS — F3289 Other specified depressive episodes: Secondary | ICD-10-CM

## 2014-02-17 DIAGNOSIS — F32A Depression, unspecified: Secondary | ICD-10-CM

## 2014-02-17 DIAGNOSIS — F329 Major depressive disorder, single episode, unspecified: Secondary | ICD-10-CM

## 2014-02-17 DIAGNOSIS — R1013 Epigastric pain: Secondary | ICD-10-CM

## 2014-02-17 DIAGNOSIS — G4700 Insomnia, unspecified: Secondary | ICD-10-CM

## 2014-02-17 MED ORDER — ALPRAZOLAM 1 MG PO TABS: 1.0000 mg | ORAL_TABLET | Freq: Every evening | ORAL | Status: DC | PRN

## 2014-02-17 NOTE — Progress Notes (Signed)
Pre visit review using our clinic review tool, if applicable. No additional management support is needed unless otherwise documented below in the visit note. 

## 2014-02-17 NOTE — Assessment & Plan Note (Signed)
Continue PPI, scheduled for EGD, management per GI.

## 2014-02-17 NOTE — Progress Notes (Signed)
Subjective:    Patient ID: Teresa Morgan, female    DOB: 11-Oct-1956, 58 y.o.   MRN: [id number]  HPI  Teresa Morgan is a 58 yr old female who presents today for follow up of her abdominal pain. She was seen by GI on 3/27 and it was recommended that she proceed with EGD.  This is scheduled for 5/20 and she is continued on PPI.  Reports that her symptoms wax and wane.  Pain occurs after eating and at random.    Depression- reports lately she has been feeling depressed.  Denies SI/HI but notes just feeling more blue than she had been. Attributes this to work and financial stress. She is following with Dr. Candis Schatz (psychiatry) and last saw him in February.  She has follow up in May.  She sees a therapist through work. She continues Brintellix.     Review of Systems See HPI  Past Medical History  Diagnosis Date  . Anemia   . Anxiety   . Arthritis   . Depression   . GERD (gastroesophageal reflux disease)   . Peptic ulcer   . Colon cancer   . COPD with emphysema 06/18/2012    History   Social History  . Marital Status: Married    Spouse Name: N/A    Number of Children: 3  . 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 Smoking status: Former Smoker -- 1.00 packs/day for 25 years    Types: Cigarettes    Quit date: 06/08/2005  . Smokeless tobacco: Never Used  . Alcohol Use: 0.0 oz/week     Comment: social  . Drug Use: No  . Sexual Activity: Not on file   Other Topics Concern  . 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 Heel spurs Right   . Hemrrhoids    . Plantar fascia surgery Right 2009  . Colon resection  2012    for colon cancer  . Tubal ligation  07/1980  . Appendectomy  07/07/11  . Portacath      Family History  Problem Relation Age of Onset  .  Diabetes Mother   . Colon cancer Cousin     Allergies  Allergen Reactions  . Aspirin     Upset stomach  . Augmentin [Amoxicillin-Pot Clavulanate]     Upset stomach  . Cymbalta [Duloxetine Hcl] Swelling    Swollen lips   . Prozac [Fluoxetine Hcl] Swelling    Lip swelling  . Wellbutrin [Bupropion] Swelling    Lip swelling    Current Outpatient Prescriptions on File Prior to Visit  Medication Sig Dispense Refill  . Calcium-Vitamin D 600-125 MG-UNIT TABS Take by mouth daily.        . Fluticasone Furoate-Vilanterol (BREO ELLIPTA) 100-25 MCG/INH AEPB INHALE 1 PUFF BY MOUTH EVERY DAY  60 each  11  . LORazepam (ATIVAN) 0.5 MG tablet Take 1 tablet (0.5 mg total) by mouth 2 (two) times daily as needed for anxiety.  30 tablet  0  . Multiple Vitamin (MULTIVITAMIN) tablet Take 1 tablet by mouth daily.        Marland Kitchen oxyCODONE-acetaminophen (PERCOCET) 10-325 MG per tablet 1 - 2 tabs every 4 hours as  needed for pain  120 tablet  0  . pantoprazole (PROTONIX) 40 MG tablet Take 1 tablet (40 mg total) by mouth daily.  30 tablet  3  . TURMERIC PO Take by mouth daily.       . vitamin E 200 UNIT capsule Take 200 Units by mouth daily.        . Vortioxetine HBr (BRINTELLIX) 5 MG TABS Take 1 tablet by mouth 2 (two) times daily.  60 tablet  2  . zaleplon (SONATA) 10 MG capsule Take 10 mg by mouth at bedtime as needed.       No current facility-administered medications on file prior to visit.    BP 120/76  Pulse 77  Temp(Src) 97.9 F (36.6 C) (Oral)  Wt 155 lb (70.308 kg)  SpO2 97%       Objective:   Physical Exam  Constitutional: She is oriented to person, place, and time. She appears well-developed and well-nourished. No distress.  HENT:  Head: Normocephalic and atraumatic.  Cardiovascular: Normal rate and regular rhythm.   No murmur heard. Pulmonary/Chest: Effort normal and breath sounds normal. No respiratory distress. She has no wheezes. She has no rales. She exhibits no tenderness.    Abdominal: Soft. Bowel sounds are normal. She exhibits no distension. There is no tenderness.  Musculoskeletal: She exhibits no edema.  Neurological: She is alert and oriented to person, place, and time.  Psychiatric: She has a normal mood and affect. Her behavior is normal. Judgment and thought content normal.          Assessment & Plan:

## 2014-02-17 NOTE — Assessment & Plan Note (Signed)
Fair control. Defer further medication changes to her psychiatrist. I encouraged her to follow up with her therapist whom she has not been seeing regularly.

## 2014-02-17 NOTE — Patient Instructions (Signed)
Please keep your upcoming appointment with Dr. Candis Schatz and continue to work with your therapist at work. Follow up with [place] in 3-4 months, sooner if problems or concerns.

## 2014-02-25 ENCOUNTER — Telehealth: Payer: Self-pay | Admitting: Oncology

## 2014-02-25 DIAGNOSIS — C180 Malignant neoplasm of cecum: Secondary | ICD-10-CM

## 2014-02-25 DIAGNOSIS — G629 Polyneuropathy, unspecified: Secondary | ICD-10-CM

## 2014-02-25 MED ORDER — OXYCODONE-ACETAMINOPHEN 10-325 MG PO TABS: ORAL_TABLET | ORAL | Status: DC

## 2014-02-25 NOTE — Telephone Encounter (Signed)
Patient's daugter, York Cerise, called in requesting to get her mother's Percocet refilled.  Routed information to Dr. Marin Olp.

## 2014-02-27 ENCOUNTER — Ambulatory Visit (HOSPITAL_BASED_OUTPATIENT_CLINIC_OR_DEPARTMENT_OTHER): Payer: 59

## 2014-02-27 DIAGNOSIS — C180 Malignant neoplasm of cecum: Secondary | ICD-10-CM

## 2014-02-27 DIAGNOSIS — Z452 Encounter for adjustment and management of vascular access device: Secondary | ICD-10-CM

## 2014-02-27 MED ORDER — SODIUM CHLORIDE 0.9 % IJ SOLN
10.0000 mL | INTRAMUSCULAR | Status: DC | PRN
  Administered 2014-02-27: 10 mL via INTRAVENOUS
  Filled 2014-02-27: qty 10

## 2014-02-27 MED ORDER — HEPARIN SOD (PORK) LOCK FLUSH 100 UNIT/ML IV SOLN
500.0000 [IU] | Freq: Once | INTRAVENOUS | Status: AC
  Administered 2014-02-27: 500 [IU] via INTRAVENOUS
  Filled 2014-02-27: qty 5

## 2014-02-27 NOTE — Patient Instructions (Signed)

## 2014-03-10 ENCOUNTER — Telehealth: Payer: Self-pay | Admitting: Hematology & Oncology

## 2014-03-10 ENCOUNTER — Telehealth: Payer: Self-pay

## 2014-03-10 NOTE — Telephone Encounter (Signed)
Transferred to RN line to triage and gave RN note to call pt she has bruising from knee to groin

## 2014-03-10 NOTE — Telephone Encounter (Signed)
Received VM from pt questioning if she should be evaluated by PCP or Dr Marin Olp for "bruise from my knee to my groin." reports she has spoken to other nurses here in the past about my "muscle cramping."   Returned call to get more information but got VM. Left message on VM to contact primary care office since this problem does not appear to be related to cancer care, with all labs/imaging stable on last visit, and Dr Marin Olp will be out of the office tomorrow. Advised to call back for questions. dph

## 2014-03-24 ENCOUNTER — Other Ambulatory Visit: Payer: Self-pay | Admitting: Nurse Practitioner

## 2014-03-24 DIAGNOSIS — G629 Polyneuropathy, unspecified: Secondary | ICD-10-CM

## 2014-03-24 DIAGNOSIS — C180 Malignant neoplasm of cecum: Secondary | ICD-10-CM

## 2014-03-24 MED ORDER — OXYCODONE-ACETAMINOPHEN 10-325 MG PO TABS: ORAL_TABLET | ORAL | Status: DC

## 2014-04-02 ENCOUNTER — Encounter: Payer: Self-pay | Admitting: Gastroenterology

## 2014-04-02 ENCOUNTER — Ambulatory Visit (AMBULATORY_SURGERY_CENTER): Payer: 59 | Admitting: Gastroenterology

## 2014-04-02 VITALS — BP 121/74 | HR 58 | Temp 97.1°F | Resp 20 | Ht 67.0 in | Wt 154.0 lb

## 2014-04-02 DIAGNOSIS — K2990 Gastroduodenitis, unspecified, without bleeding: Secondary | ICD-10-CM

## 2014-04-02 DIAGNOSIS — A048 Other specified bacterial intestinal infections: Secondary | ICD-10-CM

## 2014-04-02 DIAGNOSIS — R1013 Epigastric pain: Secondary | ICD-10-CM

## 2014-04-02 DIAGNOSIS — K2970 Gastritis, unspecified, without bleeding: Secondary | ICD-10-CM

## 2014-04-02 MED ORDER — SODIUM CHLORIDE 0.9 % IV SOLN: 500.0000 mL | INTRAVENOUS | Status: DC

## 2014-04-02 NOTE — Op Note (Signed)
[facility]  [address]. [place], [address]   ENDOSCOPY PROCEDURE REPORT  PATIENT: Teresa, Morgan  MR#: [id number] BIRTHDATE: 1956/01/17 , 26  yrs. old GENDER: Female ENDOSCOPIST: Milus Banister, MD REFERRED BY:  Debbrah Alar, FNP PROCEDURE DATE:  04/02/2014 PROCEDURE:  EGD w/ biopsy ASA CLASS:     Class III INDICATIONS:  dyspepsia (normal imaging and labs). MEDICATIONS: MAC sedation, administered by CRNA and propofol (Diprivan) 100mg  IV TOPICAL ANESTHETIC: Cetacaine Spray  DESCRIPTION OF PROCEDURE: After the risks benefits and alternatives of the procedure were thoroughly explained, informed consent was obtained.  The LB [id number] endoscope was introduced through the mouth and advanced to the second portion of the duodenum. Without limitations.  The instrument was slowly withdrawn as the mucosa was fully examined.    There was moderate pan-gastritis.  This was biopsied and sent to pathology.  The examination was otherwise normal.  Retroflexed views revealed no abnormalities.     The scope was then withdrawn from the patient and the procedure completed.  COMPLICATIONS: There were no complications.  ENDOSCOPIC IMPRESSION: There was moderate pan-gastritis.  This was biopsied and sent to pathology.  The examination was otherwise normal.  RECOMMENDATIONS: Await final pathology.   eSigned:  Milus Banister, MD 04/02/2014 11:38 AM

## 2014-04-02 NOTE — Progress Notes (Signed)
Patient has a left portacath.

## 2014-04-02 NOTE — Progress Notes (Signed)
Called to room to assist during endoscopic procedure.  Patient ID and intended procedure confirmed with present staff. Received instructions for my participation in the procedure from the performing physician.  

## 2014-04-02 NOTE — Patient Instructions (Signed)

## 2014-04-02 NOTE — Progress Notes (Signed)
Report to pacu rn, vss, bbs=clear 

## 2014-04-03 ENCOUNTER — Telehealth: Payer: Self-pay | Admitting: *Deleted

## 2014-04-03 NOTE — Telephone Encounter (Signed)
No answer, message left for patient. 

## 2014-04-10 ENCOUNTER — Ambulatory Visit (HOSPITAL_BASED_OUTPATIENT_CLINIC_OR_DEPARTMENT_OTHER): Payer: 59

## 2014-04-10 DIAGNOSIS — R1013 Epigastric pain: Secondary | ICD-10-CM

## 2014-04-10 DIAGNOSIS — C180 Malignant neoplasm of cecum: Secondary | ICD-10-CM

## 2014-04-10 DIAGNOSIS — Z452 Encounter for adjustment and management of vascular access device: Secondary | ICD-10-CM

## 2014-04-10 MED ORDER — HEPARIN SOD (PORK) LOCK FLUSH 100 UNIT/ML IV SOLN
500.0000 [IU] | Freq: Once | INTRAVENOUS | Status: AC
  Administered 2014-04-10: 500 [IU] via INTRAVENOUS
  Filled 2014-04-10: qty 5

## 2014-04-10 MED ORDER — SODIUM CHLORIDE 0.9 % IJ SOLN
10.0000 mL | INTRAMUSCULAR | Status: DC | PRN
  Administered 2014-04-10: 10 mL via INTRAVENOUS
  Filled 2014-04-10: qty 10

## 2014-04-10 NOTE — Patient Instructions (Signed)

## 2014-04-11 ENCOUNTER — Telehealth: Payer: Self-pay

## 2014-04-11 DIAGNOSIS — A048 Other specified bacterial intestinal infections: Secondary | ICD-10-CM

## 2014-04-11 MED ORDER — CLARITHROMYCIN 500 MG PO TABS: 500.0000 mg | ORAL_TABLET | Freq: Two times a day (BID) | ORAL | Status: DC

## 2014-04-11 MED ORDER — AMOXICILLIN 500 MG PO TABS: 1000.0000 mg | ORAL_TABLET | Freq: Two times a day (BID) | ORAL | Status: DC

## 2014-04-11 NOTE — Telephone Encounter (Signed)
Pt has been notified and meds sent to the pharmacy

## 2014-04-16 ENCOUNTER — Ambulatory Visit (HOSPITAL_BASED_OUTPATIENT_CLINIC_OR_DEPARTMENT_OTHER)
Admission: RE | Admit: 2014-04-16 | Discharge: 2014-04-16 | Disposition: A | Discharge status: Home / Self Care | Payer: 59 | Source: Ambulatory Visit | Attending: Hematology & Oncology | Admitting: Hematology & Oncology

## 2014-04-16 ENCOUNTER — Encounter (HOSPITAL_BASED_OUTPATIENT_CLINIC_OR_DEPARTMENT_OTHER): Payer: Self-pay

## 2014-04-16 DIAGNOSIS — C189 Malignant neoplasm of colon, unspecified: Secondary | ICD-10-CM | POA: Diagnosis present

## 2014-04-16 DIAGNOSIS — R911 Solitary pulmonary nodule: Secondary | ICD-10-CM | POA: Insufficient documentation

## 2014-04-16 DIAGNOSIS — C180 Malignant neoplasm of cecum: Secondary | ICD-10-CM

## 2014-04-16 MED ORDER — IOHEXOL 300 MG/ML  SOLN
100.0000 mL | Freq: Once | INTRAMUSCULAR | Status: AC | PRN
  Administered 2014-04-16: 100 mL via INTRAVENOUS

## 2014-04-23 ENCOUNTER — Encounter: Payer: Self-pay | Admitting: Hematology & Oncology

## 2014-04-23 ENCOUNTER — Other Ambulatory Visit (HOSPITAL_BASED_OUTPATIENT_CLINIC_OR_DEPARTMENT_OTHER): Payer: 59 | Admitting: Lab

## 2014-04-23 ENCOUNTER — Ambulatory Visit (HOSPITAL_BASED_OUTPATIENT_CLINIC_OR_DEPARTMENT_OTHER): Payer: 59 | Admitting: Hematology & Oncology

## 2014-04-23 ENCOUNTER — Ambulatory Visit: Payer: 59

## 2014-04-23 VITALS — BP 116/86 | HR 87 | Temp 98.1°F | Resp 14 | Ht 67.0 in | Wt 154.0 lb

## 2014-04-23 DIAGNOSIS — C180 Malignant neoplasm of cecum: Secondary | ICD-10-CM

## 2014-04-23 DIAGNOSIS — R911 Solitary pulmonary nodule: Secondary | ICD-10-CM

## 2014-04-23 DIAGNOSIS — G620 Drug-induced polyneuropathy: Secondary | ICD-10-CM

## 2014-04-23 DIAGNOSIS — D500 Iron deficiency anemia secondary to blood loss (chronic): Secondary | ICD-10-CM

## 2014-04-23 DIAGNOSIS — G629 Polyneuropathy, unspecified: Secondary | ICD-10-CM

## 2014-04-23 LAB — CBC WITH DIFFERENTIAL (CANCER CENTER ONLY)
BASO#: 0 10*3/uL (ref 0.0–0.2)
BASO%: 0.3 % (ref 0.0–2.0)
EOS%: 3.8 % (ref 0.0–7.0)
Eosinophils Absolute: 0.1 10*3/uL (ref 0.0–0.5)
HEMATOCRIT: 38.7 % (ref 34.8–46.6)
HEMOGLOBIN: 12.6 g/dL (ref 11.6–15.9)
LYMPH#: 0.7 10*3/uL — ABNORMAL LOW (ref 0.9–3.3)
LYMPH%: 21.4 % (ref 14.0–48.0)
MCH: 23.1 pg — ABNORMAL LOW (ref 26.0–34.0)
MCHC: 32.6 g/dL (ref 32.0–36.0)
MCV: 71 fL — ABNORMAL LOW (ref 81–101)
MONO#: 0.4 10*3/uL (ref 0.1–0.9)
MONO%: 10.1 % (ref 0.0–13.0)
NEUT#: 2.2 10*3/uL (ref 1.5–6.5)
NEUT%: 64.4 % (ref 39.6–80.0)
Platelets: 137 10*3/uL — ABNORMAL LOW (ref 145–400)
RBC: 5.46 10*6/uL — AB (ref 3.70–5.32)
RDW: 14.4 % (ref 11.1–15.7)
WBC: 3.5 10*3/uL — ABNORMAL LOW (ref 3.9–10.0)

## 2014-04-23 LAB — CMP (CANCER CENTER ONLY)
ALK PHOS: 68 U/L (ref 26–84)
ALT(SGPT): 19 U/L (ref 10–47)
AST: 22 U/L (ref 11–38)
Albumin: 3.9 g/dL (ref 3.3–5.5)
BILIRUBIN TOTAL: 0.9 mg/dL (ref 0.20–1.60)
BUN, Bld: 9 mg/dL (ref 7–22)
CO2: 30 meq/L (ref 18–33)
CREATININE: 0.8 mg/dL (ref 0.6–1.2)
Calcium: 8.7 mg/dL (ref 8.0–10.3)
Chloride: 101 mEq/L (ref 98–108)
Glucose, Bld: 85 mg/dL (ref 73–118)
Potassium: 3.6 mEq/L (ref 3.3–4.7)
Sodium: 142 mEq/L (ref 128–145)
Total Protein: 7.3 g/dL (ref 6.4–8.1)

## 2014-04-23 LAB — LACTATE DEHYDROGENASE: LDH: 205 U/L (ref 94–250)

## 2014-04-23 LAB — CEA: CEA: 2 ng/mL (ref 0.0–5.0)

## 2014-04-23 MED ORDER — OXYCODONE-ACETAMINOPHEN 10-325 MG PO TABS: ORAL_TABLET | ORAL | Status: DC

## 2014-04-23 NOTE — Progress Notes (Signed)
Hematology and Oncology Follow Up Visit  Teresa Morgan [id number] 1956-05-10 58 y.o. 04/23/2014   Principle Diagnosis:  1. Stage IIIB (T2 N2 M0) adenocarcinoma of the cecum. 2. Neuropathy secondary to chemotherapy.  Current Therapy:   Observation.     Interim History:  Ms.  Teresa Morgan is in for followup. We last saw her back in December.  We did go ahead and do a CT scan. The CT scan of the chest showed a new right lower lobe pulmonary nodule. This was 1 cm. Teresa Morgan was a smoker. She has had this before. We will have to get another CT scan in about a month. There is nothing that we can see that suggested malignancy below the diaphragm in the abdomen or pelvis.  She still working. She is doing okay at work.  She just got back from vacation. She had a good time at the beach.  She takes oxygen code him for pain. This does help with the neuropathy. She also takes Neurontin.  Her last CEA was 2.0. Medications: Current outpatient prescriptions:ALPRAZolam (XANAX) 1 MG tablet, Take 1 tablet (1 mg total) by mouth at bedtime as needed., Disp: 30 tablet, Rfl: 4;  amoxicillin (AMOXIL) 500 MG tablet, Take 1,000 mg by mouth 2 (two) times daily., Disp: , Rfl: ;  Calcium-Vitamin D 600-125 MG-UNIT TABS, Take by mouth daily.  , Disp: , Rfl: ;  clarithromycin (BIAXIN) 500 MG tablet, Take 500 mg by mouth 2 (two) times daily., Disp: , Rfl:  DENTA 5000 PLUS 1.1 % CREA dental cream, Place 1 application onto teeth at bedtime. , Disp: , Rfl: ;  Fluticasone Furoate-Vilanterol (BREO ELLIPTA) 100-25 MCG/INH AEPB, INHALE 1 PUFF BY MOUTH EVERY DAY, Disp: 60 each, Rfl: 11;  gabapentin (NEURONTIN) 600 MG tablet, TAKE 1 TABLET (600 MG TOTAL) BY MOUTH 4 (FOUR) TIMES DAILY., Disp: 120 tablet, Rfl: 3 LORazepam (ATIVAN) 0.5 MG tablet, Take 1 tablet (0.5 mg total) by mouth 2 (two) times daily as needed for anxiety., Disp: 30 tablet, Rfl: 0;  Multiple Vitamin (MULTIVITAMIN) tablet, Take 1 tablet by mouth daily.  , Disp: , Rfl: ;   oxyCODONE-acetaminophen (PERCOCET) 10-325 MG per tablet, 1 - 2 tabs every 4 hours as needed for pain, Disp: 120 tablet, Rfl: 0 pantoprazole (PROTONIX) 40 MG tablet, Take 1 tablet (40 mg total) by mouth daily., Disp: 30 tablet, Rfl: 3;  TURMERIC PO, Take by mouth daily. , Disp: , Rfl: ;  vitamin E 200 UNIT capsule, Take 200 Units by mouth daily.  , Disp: , Rfl: ;  Vortioxetine HBr 5 MG TABS, Take 5 mg by mouth 2 (two) times daily. Brintellix   2 tabs by mouth daily in the AM and 1 tablet by mouth daily in the PM, Disp: , Rfl:  zaleplon (SONATA) 10 MG capsule, Take 10 mg by mouth at bedtime as needed., Disp: , Rfl:   Allergies:  Allergies  Allergen Reactions  . Aspirin     Upset stomach  . Cymbalta [Duloxetine Hcl] Swelling    Swollen lips   . Prozac [Fluoxetine Hcl] Swelling    Lip swelling  . Wellbutrin [Bupropion] Swelling    Lip swelling    Past Medical History, Surgical history, Social history, and Family History were reviewed and updated.  Review of Systems: As above  Physical Exam:  height is 5\' 7"  (1.702 m) and weight is 154 lb (69.854 kg). Her oral temperature is 98.1 F (36.7 C). Her blood pressure is 116/86 and her pulse is  87. Her respiration is 14.   Well-developed and well-nourished Asian female in no obvious distress. Head and neck exam shows no ocular or oral lesions. She is no palpable cervical or supraclavicular was noted. Lungs are clear. Cardiac exam regular in rhythm with no murmurs rubs or bruits. Abdomen is soft. Good bowel sounds. There is no fluid wave. His blood a laparotomy scar. There is no palpable liver or spleen tip. Back exam no tenderness over the spine ribs or hips. Extremities shows no clubbing cyanosis or edema. Neurological exam shows no focal neurological deficits.  Lab Results  Component Value Date   WBC 3.5* 04/23/2014   HGB 12.6 04/23/2014   HCT 38.7 04/23/2014   MCV 71* 04/23/2014   PLT 137* 04/23/2014     Chemistry      Component Value  Date/Time   NA 142 04/23/2014 1427   NA 138 10/21/2013 1119   K 3.6 04/23/2014 1427   K 3.7 10/21/2013 1119   CL 101 04/23/2014 1427   CL 102 10/21/2013 1119   CO2 30 04/23/2014 1427   CO2 30 10/21/2013 1119   BUN 9 04/23/2014 1427   BUN 12 10/21/2013 1119   CREATININE 0.8 04/23/2014 1427   CREATININE 0.90 10/21/2013 1119      Component Value Date/Time   CALCIUM 8.7 04/23/2014 1427   CALCIUM 9.4 10/21/2013 1119   ALKPHOS 68 04/23/2014 1427   ALKPHOS 62 01/20/2014 1349   AST 22 04/23/2014 1427   AST 16 01/20/2014 1349   ALT 19 04/23/2014 1427   ALT 17 01/20/2014 1349   BILITOT 0.90 04/23/2014 1427   BILITOT 0.5 01/20/2014 1349         Impression Plan: Teresa Morgan is 58 year old Asian female with a history of stage IIIB colon cancer. She underwent systemic chemotherapy after resection. She had 4 positive lymph nodes. She received FOLFOX. She completed this in March of 2013.  I still don't think this nodule is anything malignant. However, we will followup with a CT scan. She is at risk for a second primary  cancer, that being lung cancer.  We'll go ahead and get her back in. I will get her CAT scan the same day that I see her.  Volanda Napoleon, MD 6/10/20155:36 PM

## 2014-04-23 NOTE — Patient Instructions (Signed)

## 2014-05-21 ENCOUNTER — Other Ambulatory Visit: Payer: Self-pay | Admitting: *Deleted

## 2014-05-21 DIAGNOSIS — D500 Iron deficiency anemia secondary to blood loss (chronic): Secondary | ICD-10-CM

## 2014-05-22 ENCOUNTER — Ambulatory Visit (HOSPITAL_BASED_OUTPATIENT_CLINIC_OR_DEPARTMENT_OTHER): Payer: 59 | Admitting: Hematology & Oncology

## 2014-05-22 ENCOUNTER — Ambulatory Visit (HOSPITAL_BASED_OUTPATIENT_CLINIC_OR_DEPARTMENT_OTHER)
Admission: RE | Admit: 2014-05-22 | Discharge: 2014-05-22 | Disposition: A | Discharge status: Home / Self Care | Payer: 59 | Source: Ambulatory Visit | Attending: Hematology & Oncology | Admitting: Hematology & Oncology

## 2014-05-22 ENCOUNTER — Other Ambulatory Visit (HOSPITAL_BASED_OUTPATIENT_CLINIC_OR_DEPARTMENT_OTHER): Payer: 59 | Admitting: Lab

## 2014-05-22 ENCOUNTER — Encounter: Payer: Self-pay | Admitting: Hematology & Oncology

## 2014-05-22 VITALS — BP 118/64 | HR 69 | Temp 98.0°F | Resp 14 | Ht 67.0 in | Wt 156.0 lb

## 2014-05-22 DIAGNOSIS — G629 Polyneuropathy, unspecified: Secondary | ICD-10-CM

## 2014-05-22 DIAGNOSIS — G589 Mononeuropathy, unspecified: Secondary | ICD-10-CM

## 2014-05-22 DIAGNOSIS — R911 Solitary pulmonary nodule: Secondary | ICD-10-CM

## 2014-05-22 DIAGNOSIS — C180 Malignant neoplasm of cecum: Secondary | ICD-10-CM

## 2014-05-22 DIAGNOSIS — D500 Iron deficiency anemia secondary to blood loss (chronic): Secondary | ICD-10-CM

## 2014-05-22 DIAGNOSIS — C189 Malignant neoplasm of colon, unspecified: Secondary | ICD-10-CM | POA: Insufficient documentation

## 2014-05-22 LAB — CBC WITH DIFFERENTIAL (CANCER CENTER ONLY)
BASO#: 0 10*3/uL (ref 0.0–0.2)
BASO%: 0.2 % (ref 0.0–2.0)
EOS%: 3.7 % (ref 0.0–7.0)
Eosinophils Absolute: 0.2 10*3/uL (ref 0.0–0.5)
HCT: 40 % (ref 34.8–46.6)
HGB: 13 g/dL (ref 11.6–15.9)
LYMPH#: 1.3 10*3/uL (ref 0.9–3.3)
LYMPH%: 27.2 % (ref 14.0–48.0)
MCH: 22.9 pg — AB (ref 26.0–34.0)
MCHC: 32.5 g/dL (ref 32.0–36.0)
MCV: 71 fL — ABNORMAL LOW (ref 81–101)
MONO#: 0.3 10*3/uL (ref 0.1–0.9)
MONO%: 5.7 % (ref 0.0–13.0)
NEUT%: 63.2 % (ref 39.6–80.0)
NEUTROS ABS: 3.1 10*3/uL (ref 1.5–6.5)
PLATELETS: 118 10*3/uL — AB (ref 145–400)
RBC: 5.67 10*6/uL — AB (ref 3.70–5.32)
RDW: 14.7 % (ref 11.1–15.7)
WBC: 4.9 10*3/uL (ref 3.9–10.0)

## 2014-05-22 LAB — LACTATE DEHYDROGENASE: LDH: 206 U/L (ref 94–250)

## 2014-05-22 LAB — COMPREHENSIVE METABOLIC PANEL
ALBUMIN: 4.3 g/dL (ref 3.5–5.2)
ALT: 15 U/L (ref 0–35)
AST: 16 U/L (ref 0–37)
Alkaline Phosphatase: 75 U/L (ref 39–117)
BUN: 16 mg/dL (ref 6–23)
CALCIUM: 9.2 mg/dL (ref 8.4–10.5)
CHLORIDE: 104 meq/L (ref 96–112)
CO2: 28 mEq/L (ref 19–32)
Creatinine, Ser: 0.84 mg/dL (ref 0.50–1.10)
Glucose, Bld: 127 mg/dL — ABNORMAL HIGH (ref 70–99)
POTASSIUM: 3.7 meq/L (ref 3.5–5.3)
SODIUM: 139 meq/L (ref 135–145)
TOTAL PROTEIN: 6.6 g/dL (ref 6.0–8.3)
Total Bilirubin: 0.5 mg/dL (ref 0.2–1.2)

## 2014-05-22 LAB — CEA: CEA: 1.8 ng/mL (ref 0.0–5.0)

## 2014-05-22 MED ORDER — VITAMIN B-6 250 MG PO TABS: 250.0000 mg | ORAL_TABLET | Freq: Every day | ORAL | Status: DC

## 2014-05-22 MED ORDER — OXYCODONE-ACETAMINOPHEN 10-325 MG PO TABS: ORAL_TABLET | ORAL | Status: DC

## 2014-05-22 NOTE — Progress Notes (Signed)
Hematology and Oncology Follow Up Visit  Teresa Morgan [id number] [birth year] 58 y.o. 05/22/2014   Principle Diagnosis:  1. Stage IIIB (T2 N2 M0) adenocarcinoma of the cecum. 2. Neuropathy secondary to chemotherapy.  Current Therapy:    Observation     Interim History:  Ms.  Morgan is back for followup. Last saw her back in June. She had a CT scan as part of her followup for the colon cancer. The radiologist noted a lung nodule in the right lung. Given her history of tobacco use, we had to worry about a second primary. Her CEA level was normal.  We repeated her CT scan today. This basically showed a 5 mm right lower lobe nodule. Previously it measured 10 mm. There was some changes Kister with inflammation and probable infection. There was no obvious lymphadenopathy. She had normal upper abdominal structures. Bony structures were normal. She had moderate emphysematous changes.  She's not smoking. This is all very encouraging. Her neuropathy is doing a little better. She is on Neurontin. She does take some Percocet which does help a bit. Allows her to stay functional. She's working.  She's had no change in bowel or bladder habits. She's had no bleeding. She's had no headache. She had her mammogram and gynecologic exam last year.  Her appetite is good.  Medications: Current outpatient prescriptions:ALPRAZolam (XANAX) 1 MG tablet, Take 1 tablet (1 mg total) by mouth at bedtime as needed., Disp: 30 tablet, Rfl: 4;  Calcium-Vitamin D 600-125 MG-UNIT TABS, Take by mouth daily.  , Disp: , Rfl: ;  DENTA 5000 PLUS 1.1 % CREA dental cream, Place 1 application onto teeth at bedtime. , Disp: , Rfl:  Fluticasone Furoate-Vilanterol (BREO ELLIPTA) 100-25 MCG/INH AEPB, INHALE 1 PUFF BY MOUTH EVERY DAY, Disp: 60 each, Rfl: 11;  gabapentin (NEURONTIN) 600 MG tablet, TAKE 1 TABLET (600 MG TOTAL) BY MOUTH 4 (FOUR) TIMES DAILY., Disp: 120 tablet, Rfl: 3;  LORazepam (ATIVAN) 0.5 MG tablet, Take 1 tablet (0.5 mg  total) by mouth 2 (two) times daily as needed for anxiety., Disp: 30 tablet, Rfl: 0 Multiple Vitamin (MULTIVITAMIN) tablet, Take 1 tablet by mouth daily.  , Disp: , Rfl: ;  oxyCODONE-acetaminophen (PERCOCET) 10-325 MG per tablet, 1 - 2 tabs every 4 hours as needed for pain, Disp: 120 tablet, Rfl: 0;  pantoprazole (PROTONIX) 40 MG tablet, Take 1 tablet (40 mg total) by mouth daily., Disp: 30 tablet, Rfl: 3;  TURMERIC PO, Take by mouth daily. , Disp: , Rfl:  vitamin E 200 UNIT capsule, Take 200 Units by mouth daily.  , Disp: , Rfl: ;  Vortioxetine HBr 5 MG TABS, Take 5 mg by mouth 2 (two) times daily. Brintellix   2 tabs by mouth daily in the AM and 1 tablet by mouth daily in the PM, Disp: , Rfl: ;  zaleplon (SONATA) 10 MG capsule, Take 10 mg by mouth at bedtime as needed., Disp: , Rfl: ;  Pyridoxine HCl (VITAMIN B-6) 250 MG tablet, Take 1 tablet (250 mg total) by mouth daily., Disp: 30 tablet, Rfl: 12  Allergies:  Allergies  Allergen Reactions  . Aspirin     Upset stomach  . Cymbalta [Duloxetine Hcl] Swelling    Swollen lips   . Prozac [Fluoxetine Hcl] Swelling    Lip swelling  . Wellbutrin [Bupropion] Swelling    Lip swelling    Past Medical History, Surgical history, Social history, and Family History were reviewed and updated.  Review of Systems: As above  Physical Exam:  height is 5\' 7"  (1.702 m) and weight is 156 lb (70.761 kg). Her oral temperature is 98 F (36.7 C). Her blood pressure is 118/64 and her pulse is 69. Her respiration is 14.   Lungs are clear. The arousal wheezes or rhonchi are noted. Cardiac exam regular in rhythm with no murmurs rubs or bruits. Abdomen is soft. She has good bowel sounds. There is no fluid wave. There is no palpable liver or spleen tip. She has well-healed laparoscopy scar. Extremities shows no clubbing cyanosis or edema. Neurological exam shows no focal neurological deficits. Skin exam no rashes.  Lab Results  Component Value Date   WBC 4.9 05/22/2014    HGB 13.0 05/22/2014   HCT 40.0 05/22/2014   MCV 71* 05/22/2014   PLT 118* 05/22/2014     Chemistry      Component Value Date/Time   NA 142 04/23/2014 1427   NA 138 10/21/2013 1119   K 3.6 04/23/2014 1427   K 3.7 10/21/2013 1119   CL 101 04/23/2014 1427   CL 102 10/21/2013 1119   CO2 30 04/23/2014 1427   CO2 30 10/21/2013 1119   BUN 9 04/23/2014 1427   BUN 12 10/21/2013 1119   CREATININE 0.8 04/23/2014 1427   CREATININE 0.90 10/21/2013 1119      Component Value Date/Time   CALCIUM 8.7 04/23/2014 1427   CALCIUM 9.4 10/21/2013 1119   ALKPHOS 68 04/23/2014 1427   ALKPHOS 62 01/20/2014 1349   AST 22 04/23/2014 1427   AST 16 01/20/2014 1349   ALT 19 04/23/2014 1427   ALT 17 01/20/2014 1349   BILITOT 0.90 04/23/2014 1427   BILITOT 0.5 01/20/2014 1349         Impression and Plan: Teresa Morgan is 58 year old female with stage IIIB colon cancer. This was of the cecum. She underwent resection back in I think September of 2012. She had 4 positive lymph nodes. She pretty chemotherapy in March of 2013.  She still has a risk of recurrence. We still have to be cautious and watch.  I still want to get a CT of her chest. I want to get this or see her back in 4 months.  I think that as long as her CEA is normal, we can hold off on doing scans of her abdomen.   Teresa Napoleon, MD 7/9/20156:39 PM

## 2014-05-23 ENCOUNTER — Telehealth: Payer: Self-pay | Admitting: *Deleted

## 2014-05-23 NOTE — Telephone Encounter (Addendum)
Message copied by Lenn Sink on Fri May 23, 2014  9:12 AM ------      Message from: Burney Gauze R      Created: Thu May 22, 2014 10:16 PM       Call - tumor marker is normal!!! pete ------Informed pt that tumor marker is normal.

## 2014-05-28 ENCOUNTER — Telehealth: Payer: Self-pay | Admitting: Hematology & Oncology

## 2014-05-28 NOTE — Telephone Encounter (Signed)
Per order to sch lab/md and ct scan in 56months on same day.  apts were sch and calendar was mailed out to patient's home

## 2014-05-30 ENCOUNTER — Other Ambulatory Visit: Payer: Self-pay | Admitting: Nurse Practitioner

## 2014-05-30 MED ORDER — GABAPENTIN 600 MG PO TABS: ORAL_TABLET | ORAL | Status: DC

## 2014-06-04 ENCOUNTER — Ambulatory Visit (HOSPITAL_BASED_OUTPATIENT_CLINIC_OR_DEPARTMENT_OTHER): Payer: 59

## 2014-06-04 VITALS — BP 109/71 | HR 66 | Temp 96.9°F | Resp 18

## 2014-06-04 DIAGNOSIS — C180 Malignant neoplasm of cecum: Secondary | ICD-10-CM

## 2014-06-04 DIAGNOSIS — Z452 Encounter for adjustment and management of vascular access device: Secondary | ICD-10-CM

## 2014-06-04 DIAGNOSIS — D500 Iron deficiency anemia secondary to blood loss (chronic): Secondary | ICD-10-CM

## 2014-06-04 MED ORDER — HEPARIN SOD (PORK) LOCK FLUSH 100 UNIT/ML IV SOLN
500.0000 [IU] | Freq: Once | INTRAVENOUS | Status: AC
  Administered 2014-06-04: 500 [IU] via INTRAVENOUS
  Filled 2014-06-04: qty 5

## 2014-06-04 MED ORDER — SODIUM CHLORIDE 0.9 % IJ SOLN
10.0000 mL | INTRAMUSCULAR | Status: DC | PRN
  Administered 2014-06-04: 10 mL via INTRAVENOUS
  Filled 2014-06-04: qty 10

## 2014-06-04 NOTE — Patient Instructions (Signed)

## 2014-06-13 ENCOUNTER — Encounter: Payer: Self-pay | Admitting: Gastroenterology

## 2014-06-13 ENCOUNTER — Ambulatory Visit (INDEPENDENT_AMBULATORY_CARE_PROVIDER_SITE_OTHER): Payer: 59 | Admitting: Gastroenterology

## 2014-06-13 VITALS — BP 108/66 | HR 70 | Ht 67.0 in | Wt 156.6 lb

## 2014-06-13 DIAGNOSIS — K2990 Gastroduodenitis, unspecified, without bleeding: Principal | ICD-10-CM

## 2014-06-13 DIAGNOSIS — C189 Malignant neoplasm of colon, unspecified: Secondary | ICD-10-CM

## 2014-06-13 DIAGNOSIS — K2970 Gastritis, unspecified, without bleeding: Secondary | ICD-10-CM

## 2014-06-13 NOTE — Progress Notes (Signed)
Review of pertinent gastrointestinal problems: 1. 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 Juanita Craver 05/2011. She had repeat colonoscopy after 1 year interval (per patient) and it was normal.  She is scheduled to have another one at 3 year interval.  July 2015 she asked to switch to a lower GI for further surveillance examinations. 2. H pylori + gastritis; Dr. Ardis Hughs EGD 03/2014 treated with prev-pac type abx.   HPI: This is a  very pleasant 58 year old woman whom I last saw about 2 months ago the time of upper endoscopy. See those results summarized above.  Completed abx for h pylori, feels much better. epigastric pains gone.     Past Medical History  Diagnosis Date  . Anemia   . Anxiety   . Arthritis   . Depression   . GERD (gastroesophageal reflux disease)   . Peptic ulcer   . Colon cancer   . COPD with emphysema 06/18/2012    Past Surgical History  Procedure Laterality Date  . Heel spurs Right   . Hemrrhoids    . Plantar fascia surgery Right 2009  . Colon resection  2012    for colon cancer  . Tubal ligation  07/1980  . Appendectomy  07/07/11  . Portacath      Current Outpatient Prescriptions  Medication Sig Dispense Refill  . ALPRAZolam (XANAX) 1 MG tablet Take 1 tablet (1 mg total) by mouth at bedtime as needed.  30 tablet  4  . Calcium-Vitamin D 600-125 MG-UNIT TABS Take by mouth daily.        . DENTA 5000 PLUS 1.1 % CREA dental cream Place 1 application onto teeth at bedtime.       . Fluticasone Furoate-Vilanterol (BREO ELLIPTA) 100-25 MCG/INH AEPB INHALE 1 PUFF BY MOUTH EVERY DAY  60 each  11  . gabapentin (NEURONTIN) 600 MG tablet TAKE 1 TABLET (600 MG TOTAL) BY MOUTH 4 (FOUR) TIMES DAILY.  120 tablet  3  . LORazepam (ATIVAN) 0.5 MG tablet Take 1 tablet (0.5 mg total) by mouth 2 (two) times daily as needed for anxiety.  30 tablet  0  . Multiple Vitamin (MULTIVITAMIN) tablet Take 1 tablet by  mouth daily.        Marland Kitchen oxyCODONE-acetaminophen (PERCOCET) 10-325 MG per tablet 1 - 2 tabs every 4 hours as needed for pain  120 tablet  0  . pantoprazole (PROTONIX) 40 MG tablet Take 1 tablet (40 mg total) by mouth daily.  30 tablet  3  . Pyridoxine HCl (VITAMIN B-6) 250 MG tablet Take 1 tablet (250 mg total) by mouth daily.  30 tablet  12  . TURMERIC PO Take by mouth daily.       . vitamin E 200 UNIT capsule Take 200 Units by mouth daily.        . Vortioxetine HBr 5 MG TABS Take 5 mg by mouth 2 (two) times daily. Brintellix   2 tabs by mouth daily in the AM and 1 tablet by mouth daily in the PM      . zaleplon (SONATA) 10 MG capsule Take 10 mg by mouth at bedtime as needed.       No current facility-administered medications for this visit.    Allergies as of 06/13/2014 - Review Complete 06/13/2014  Allergen Reaction Noted  . Aspirin  06/09/2011  . Cymbalta [duloxetine hcl] Swelling 04/19/2012  . Prozac [fluoxetine hcl] Swelling 03/12/2013  . Wellbutrin [bupropion] Swelling  03/12/2013    Family History  Problem Relation Age of Onset  . Diabetes Mother   . Colon cancer Cousin     History   Social History  . Marital Status: Married    Spouse Name: N/A    Number of Children: 3  . 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 Smoking status: Former Smoker -- 1.00 packs/day for 20 years    Types: Cigarettes    Start date: 04/23/1986    Quit date: 11/23/2005  . Smokeless tobacco: Never Used     Comment: quit 8 years ago  . Alcohol Use: 0.0 oz/week     Comment: social  . Drug Use: No  . Sexual Activity: Not on file   Other Topics Concern  . 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 female with personal history of colon cancer, recent H. pylori eradication  She is feeling quite well after antibiotics for H. pylori. Her GI symptoms have completely resolved. She needs no further followup for that unless new symptoms arise. She requested that she have further screening, surveillance colonoscopies here and we will get records from her previous gastroenterologist for review in order to determine the correct interval for her next colonoscopy.

## 2014-06-13 NOTE — Patient Instructions (Signed)
We will get records sent from your previous gastroenterologist( Dr. Collene Mares) for review.  This will include any endoscopic (colonoscopy or upper endoscopy) procedures and any associated pathology reports.   Will determine when you need next surveillance/screening colonoscopy after review.

## 2014-06-16 ENCOUNTER — Ambulatory Visit (INDEPENDENT_AMBULATORY_CARE_PROVIDER_SITE_OTHER): Payer: 59 | Admitting: Family

## 2014-06-16 ENCOUNTER — Encounter: Payer: Self-pay | Admitting: Family

## 2014-06-16 VITALS — BP 124/80 | HR 65 | Temp 98.1°F | Resp 16 | Ht 67.5 in | Wt 158.0 lb

## 2014-06-16 DIAGNOSIS — F329 Major depressive disorder, single episode, unspecified: Secondary | ICD-10-CM | POA: Diagnosis not present

## 2014-06-16 DIAGNOSIS — K2970 Gastritis, unspecified, without bleeding: Secondary | ICD-10-CM

## 2014-06-16 DIAGNOSIS — F32A Depression, unspecified: Secondary | ICD-10-CM

## 2014-06-16 DIAGNOSIS — C180 Malignant neoplasm of cecum: Secondary | ICD-10-CM

## 2014-06-16 DIAGNOSIS — K2990 Gastroduodenitis, unspecified, without bleeding: Secondary | ICD-10-CM

## 2014-06-16 DIAGNOSIS — F3289 Other specified depressive episodes: Secondary | ICD-10-CM

## 2014-06-16 NOTE — Patient Instructions (Signed)
Schedule follow up with your therapist. Please schedule a follow up appointment in 6 months.

## 2014-06-16 NOTE — Assessment & Plan Note (Signed)
S/p treatment for h. Pylori. She is continuing bid ppi and wants to know if she can return to once daily treatment and I told her this should be fine.

## 2014-06-16 NOTE — Assessment & Plan Note (Signed)
Stable, I did advise her to arrange follow up with her therapist.

## 2014-06-16 NOTE — Assessment & Plan Note (Signed)
Clinically stable, management per oncology.

## 2014-06-16 NOTE — Progress Notes (Signed)
Subjective:    Patient ID: Teresa Morgan, female    DOB: 1956/02/06, 58 y.o.   MRN: [id number]  HPI  [name] is a 58 yr old female who presents today for follow up of her depression. She was last seen in our office in April.  She is followed by Dr. Candis Schatz.  She has not seen her EAP therapist in some time. Did feel better when she was meeting with her therapist. She is maintained on brintellex.  Denies SI/HI.   Abdominal pain-  Last visit she noted abdominal pain  She has since be evaluated by GI and underwent EGD which noted Pan Gastritis. Path was + for H. Pylor. She was treated with amox, clarithromycin and bid PPI.  Pt reports that her abdominal pain has nearly resolved.    Hx Stage IIIB (T2 N2 M0) adenocarcinoma of the cecum- she is in remission and continues to follow with Dr. Marin Olp for surveillance.    Review of Systems    see HPI  Past Medical History  Diagnosis Date  . Anemia   . Anxiety   . Arthritis   . Depression   . GERD (gastroesophageal reflux disease)   . Peptic ulcer   . Colon cancer   . COPD with emphysema 06/18/2012    History   Social History  . Marital Status: Married    Spouse Name: N/A    Number of Children: 3  . 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 Smoking status: Former Smoker -- 1.00 packs/day for 20 years    Types: Cigarettes    Start date: 04/23/1986    Quit date: 11/23/2005  . Smokeless tobacco: Never Used     Comment: quit 8 years ago  . Alcohol Use: 0.0 oz/week     Comment: social  . Drug Use: No  . Sexual Activity: Not on file   Other Topics Concern  . 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 Heel spurs Right   . Hemrrhoids    . Plantar fascia surgery Right 2009  . Colon resection  2012    for  colon cancer  . Tubal ligation  07/1980  . Appendectomy  07/07/11  . Portacath      Family History  Problem Relation Age of Onset  . Diabetes Mother   . Colon cancer Cousin     Allergies  Allergen Reactions  . Aspirin     Upset stomach  . Cymbalta [Duloxetine Hcl] Swelling    Swollen lips   . Prozac [Fluoxetine Hcl] Swelling    Lip swelling  . Wellbutrin [Bupropion] Swelling    Lip swelling    Current Outpatient Prescriptions on File Prior to Visit  Medication Sig Dispense Refill  . ALPRAZolam (XANAX) 1 MG tablet Take 1 tablet (1 mg total) by mouth at bedtime as needed.  30 tablet  4  . Calcium-Vitamin D 600-125 MG-UNIT TABS Take by mouth daily.        . DENTA 5000 PLUS 1.1 % CREA dental cream Place 1 application onto teeth at bedtime.       . Fluticasone Furoate-Vilanterol (BREO ELLIPTA) 100-25 MCG/INH AEPB INHALE 1 PUFF BY MOUTH EVERY  DAY  60 each  11  . gabapentin (NEURONTIN) 600 MG tablet TAKE 1 TABLET (600 MG TOTAL) BY MOUTH 4 (FOUR) TIMES DAILY.  120 tablet  3  . LORazepam (ATIVAN) 0.5 MG tablet Take 1 tablet (0.5 mg total) by mouth 2 (two) times daily as needed for anxiety.  30 tablet  0  . Multiple Vitamin (MULTIVITAMIN) tablet Take 1 tablet by mouth daily.        Marland Kitchen oxyCODONE-acetaminophen (PERCOCET) 10-325 MG per tablet 1 - 2 tabs every 4 hours as needed for pain  120 tablet  0  . pantoprazole (PROTONIX) 40 MG tablet Take 1 tablet (40 mg total) by mouth daily.  30 tablet  3  . Pyridoxine HCl (VITAMIN B-6) 250 MG tablet Take 1 tablet (250 mg total) by mouth daily.  30 tablet  12  . TURMERIC PO Take by mouth daily.       . vitamin E 200 UNIT capsule Take 200 Units by mouth daily.        . Vortioxetine HBr 5 MG TABS Take 5 mg by mouth 2 (two) times daily. Brintellix   2 tabs by mouth daily in the AM and 1 tablet by mouth daily in the PM      . zaleplon (SONATA) 10 MG capsule Take 10 mg by mouth at bedtime as needed.       No current facility-administered medications on  file prior to visit.    BP 124/80  Pulse 65  Temp(Src) 98.1 F (36.7 C) (Oral)  Resp 16  Ht 5' 7.5" (1.715 m)  Wt 158 lb (71.668 kg)  BMI 24.37 kg/m2  SpO2 98%    Objective:   Physical Exam  Constitutional: She is oriented to person, place, and time. She appears well-developed and well-nourished. No distress.  Cardiovascular: Normal rate and regular rhythm.   No murmur heard. Pulmonary/Chest: Effort normal and breath sounds normal. No respiratory distress. She has no wheezes. She has no rales. She exhibits no tenderness.  Abdominal: Soft. Bowel sounds are normal. She exhibits no distension. There is no tenderness. There is no rebound and no guarding.  Neurological: She is alert and oriented to person, place, and time.  Psychiatric: She has a normal mood and affect. Her behavior is normal. Judgment and thought content normal.          Assessment & Plan:

## 2014-06-16 NOTE — Progress Notes (Signed)
Pre visit review using our clinic review tool, if applicable. No additional management support is needed unless otherwise documented below in the visit note. 

## 2014-06-19 ENCOUNTER — Other Ambulatory Visit: Payer: Self-pay | Admitting: Family

## 2014-06-19 NOTE — Telephone Encounter (Signed)
Rx request to pharmacy/SLS  

## 2014-06-20 ENCOUNTER — Other Ambulatory Visit: Payer: Self-pay | Admitting: *Deleted

## 2014-06-20 DIAGNOSIS — G629 Polyneuropathy, unspecified: Secondary | ICD-10-CM

## 2014-06-20 DIAGNOSIS — C180 Malignant neoplasm of cecum: Secondary | ICD-10-CM

## 2014-06-20 MED ORDER — OXYCODONE-ACETAMINOPHEN 10-325 MG PO TABS: ORAL_TABLET | ORAL | Status: DC

## 2014-07-17 ENCOUNTER — Other Ambulatory Visit: Payer: Self-pay | Admitting: *Deleted

## 2014-07-17 DIAGNOSIS — G629 Polyneuropathy, unspecified: Secondary | ICD-10-CM

## 2014-07-17 DIAGNOSIS — C180 Malignant neoplasm of cecum: Secondary | ICD-10-CM

## 2014-07-17 MED ORDER — OXYCODONE-ACETAMINOPHEN 10-325 MG PO TABS: ORAL_TABLET | ORAL | Status: DC

## 2014-08-19 ENCOUNTER — Telehealth: Payer: Self-pay | Admitting: Hematology & Oncology

## 2014-08-19 NOTE — Telephone Encounter (Signed)
Patient called and sch a flush on 08-25-14

## 2014-08-20 ENCOUNTER — Other Ambulatory Visit: Payer: Self-pay | Admitting: Family

## 2014-08-20 ENCOUNTER — Telehealth: Payer: Self-pay | Admitting: Hematology & Oncology

## 2014-08-20 NOTE — Telephone Encounter (Signed)
Patient called and changed 08/25/14 apt time from 3pm to 4pm

## 2014-08-20 NOTE — Telephone Encounter (Signed)
Rx request to pharmacy/SLS  

## 2014-08-21 ENCOUNTER — Other Ambulatory Visit: Payer: Self-pay | Admitting: *Deleted

## 2014-08-21 DIAGNOSIS — G629 Polyneuropathy, unspecified: Secondary | ICD-10-CM

## 2014-08-21 DIAGNOSIS — C180 Malignant neoplasm of cecum: Secondary | ICD-10-CM

## 2014-08-21 MED ORDER — OXYCODONE-ACETAMINOPHEN 10-325 MG PO TABS: ORAL_TABLET | ORAL | Status: DC

## 2014-08-25 ENCOUNTER — Ambulatory Visit (HOSPITAL_BASED_OUTPATIENT_CLINIC_OR_DEPARTMENT_OTHER): Payer: 59

## 2014-08-25 VITALS — BP 132/81 | HR 69 | Temp 98.0°F | Resp 18

## 2014-08-25 DIAGNOSIS — Z452 Encounter for adjustment and management of vascular access device: Secondary | ICD-10-CM

## 2014-08-25 DIAGNOSIS — D500 Iron deficiency anemia secondary to blood loss (chronic): Secondary | ICD-10-CM

## 2014-08-25 DIAGNOSIS — C180 Malignant neoplasm of cecum: Secondary | ICD-10-CM

## 2014-08-25 MED ORDER — SODIUM CHLORIDE 0.9 % IJ SOLN
10.0000 mL | INTRAMUSCULAR | Status: DC | PRN
  Filled 2014-08-25: qty 10

## 2014-08-25 MED ORDER — HEPARIN SOD (PORK) LOCK FLUSH 100 UNIT/ML IV SOLN
500.0000 [IU] | Freq: Once | INTRAVENOUS | Status: AC
  Administered 2014-08-25: 500 [IU] via INTRAVENOUS
  Filled 2014-08-25: qty 5

## 2014-08-25 NOTE — Patient Instructions (Signed)

## 2014-08-27 ENCOUNTER — Other Ambulatory Visit: Payer: Self-pay | Admitting: Family

## 2014-08-27 NOTE — Telephone Encounter (Signed)
eScribe request from [facility] for refill on Brintellix Last filled - Vortioxetine HBr 5 MG TABS 09/27/2013 Sig - Route: Take 5 mg by mouth 2 (two) times daily. Brintellix 2 tabs by mouth daily in the AM and 1 tablet by mouth daily in the PM - Oral Class: Historical Med Last AEX - 08.03.15 Next AEX - 6-Mth Please Advise on refills/SLS

## 2014-09-02 ENCOUNTER — Encounter: Payer: Self-pay | Admitting: Physician Assistant

## 2014-09-02 ENCOUNTER — Ambulatory Visit (INDEPENDENT_AMBULATORY_CARE_PROVIDER_SITE_OTHER): Payer: 59 | Admitting: Physician Assistant

## 2014-09-02 VITALS — BP 120/83 | HR 72 | Temp 97.8°F | Wt 158.0 lb

## 2014-09-02 DIAGNOSIS — R102 Pelvic and perineal pain: Secondary | ICD-10-CM | POA: Insufficient documentation

## 2014-09-02 DIAGNOSIS — R82998 Other abnormal findings in urine: Secondary | ICD-10-CM

## 2014-09-02 DIAGNOSIS — Z85038 Personal history of other malignant neoplasm of large intestine: Secondary | ICD-10-CM

## 2014-09-02 DIAGNOSIS — N390 Urinary tract infection, site not specified: Secondary | ICD-10-CM

## 2014-09-02 DIAGNOSIS — M25552 Pain in left hip: Secondary | ICD-10-CM

## 2014-09-02 LAB — CBC WITH DIFFERENTIAL/PLATELET
BASOS PCT: 0.1 % (ref 0.0–3.0)
Basophils Absolute: 0 10*3/uL (ref 0.0–0.1)
EOS ABS: 0.2 10*3/uL (ref 0.0–0.7)
Eosinophils Relative: 3.2 % (ref 0.0–5.0)
HEMATOCRIT: 41.1 % (ref 36.0–46.0)
Hemoglobin: 12.6 g/dL (ref 12.0–15.0)
LYMPHS ABS: 1.7 10*3/uL (ref 0.7–4.0)
Lymphocytes Relative: 30.4 % (ref 12.0–46.0)
MCHC: 30.6 g/dL (ref 30.0–36.0)
MCV: 71.1 fl — AB (ref 78.0–100.0)
MONO ABS: 0.3 10*3/uL (ref 0.1–1.0)
Monocytes Relative: 5.8 % (ref 3.0–12.0)
Neutro Abs: 3.4 10*3/uL (ref 1.4–7.7)
Neutrophils Relative %: 60.5 % (ref 43.0–77.0)
PLATELETS: 159 10*3/uL (ref 150.0–400.0)
RBC: 5.78 Mil/uL — ABNORMAL HIGH (ref 3.87–5.11)
RDW: 15.6 % — ABNORMAL HIGH (ref 11.5–15.5)
WBC: 5.6 10*3/uL (ref 4.0–10.5)

## 2014-09-02 LAB — POCT URINALYSIS DIPSTICK
Bilirubin, UA: NEGATIVE
Glucose, UA: NEGATIVE
Ketones, UA: NEGATIVE
NITRITE UA: NEGATIVE
PH UA: 5
PROTEIN UA: NEGATIVE
Spec Grav, UA: 1.02
Urobilinogen, UA: 0.2

## 2014-09-02 LAB — BASIC METABOLIC PANEL
BUN: 17 mg/dL (ref 6–23)
CALCIUM: 9 mg/dL (ref 8.4–10.5)
CO2: 27 mEq/L (ref 19–32)
Chloride: 104 mEq/L (ref 96–112)
Creatinine, Ser: 1 mg/dL (ref 0.4–1.2)
GFR: 58.86 mL/min — ABNORMAL LOW (ref >60.00)
GLUCOSE: 78 mg/dL (ref 70–99)
POTASSIUM: 3.5 meq/L (ref 3.5–5.1)
SODIUM: 139 meq/L (ref 135–145)

## 2014-09-02 NOTE — Progress Notes (Signed)
Patient presents to clinic today c/o pain in left inguinal region and pelvis x 3 weeks.  Denies nausea, vomiting, diarrhea, melena, hematochezia or tenesmus.  Has history of mild constipation, but endorses good BM recently.  Last BM this morning. Patient has history of colon cancer s/p resection in 2012.  Last CT in June and stable. Patient denies vaginal pain, irritation or bleeding.  Denies urinary symptoms.  Is not sexually active.  Past Medical History  Diagnosis Date  . Anemia   . Anxiety   . Arthritis   . Depression   . GERD (gastroesophageal reflux disease)   . Peptic ulcer   . Colon cancer   . COPD with emphysema 06/18/2012    Current Outpatient Prescriptions on File Prior to Visit  Medication Sig Dispense Refill  . ALPRAZolam (XANAX) 1 MG tablet Take 1 tablet (1 mg total) by mouth at bedtime as needed.  30 tablet  4  . BRINTELLIX 5 MG TABS TAKE 1 TABLET BY MOUTH TWICE A DAY  60 tablet  5  . Calcium-Vitamin D 600-125 MG-UNIT TABS Take by mouth daily.        . Fluticasone Furoate-Vilanterol (BREO ELLIPTA) 100-25 MCG/INH AEPB INHALE 1 PUFF BY MOUTH EVERY DAY  60 each  11  . gabapentin (NEURONTIN) 600 MG tablet TAKE 1 TABLET (600 MG TOTAL) BY MOUTH 4 (FOUR) TIMES DAILY.  120 tablet  3  . LORazepam (ATIVAN) 0.5 MG tablet Take 1 tablet (0.5 mg total) by mouth 2 (two) times daily as needed for anxiety.  30 tablet  0  . Multiple Vitamin (MULTIVITAMIN) tablet Take 1 tablet by mouth daily.        Marland Kitchen oxyCODONE-acetaminophen (PERCOCET) 10-325 MG per tablet 1 - 2 tabs every 4 hours as needed for pain  120 tablet  0  . pantoprazole (PROTONIX) 40 MG tablet TAKE 1 TABLET BY MOUTH EVERY DAY  30 tablet  1  . Pyridoxine HCl (VITAMIN B-6) 250 MG tablet Take 1 tablet (250 mg total) by mouth daily.  30 tablet  12  . TURMERIC PO Take by mouth daily.       . vitamin E 200 UNIT capsule Take 200 Units by mouth daily.        . zaleplon (SONATA) 10 MG capsule Take 10 mg by mouth at bedtime as needed.         No current facility-administered medications on file prior to visit.    Allergies  Allergen Reactions  . Aspirin     Upset stomach  . Cymbalta [Duloxetine Hcl] Swelling    Swollen lips   . Prozac [Fluoxetine Hcl] Swelling    Lip swelling  . Wellbutrin [Bupropion] Swelling    Lip swelling    Family History  Problem Relation Age of Onset  . Diabetes Mother   . Colon cancer Cousin     History   Social History  . Marital Status: Married    Spouse Name: N/A    Number of Children: 3  . 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 Smoking status: Former Smoker -- 1.00 packs/day for 20 years    Types: Cigarettes    Start date: 04/23/1986    Quit date: 11/23/2005  . Smokeless tobacco: Never Used     Comment: quit 8 years ago  . Alcohol Use: 0.0 oz/week     Comment: social  . Drug Use: No  .  Sexual Activity: None   Other Topics Concern  . 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  All other ROS are negative.  BP 120/83  Pulse 72  Temp(Src) 97.8 F (36.6 C) (Oral)  Wt 158 lb (71.668 kg)  SpO2 97%  Physical Exam  Vitals reviewed. Constitutional: She is oriented to person, place, and time and well-developed, well-nourished, and in no distress.  HENT:  Head: Normocephalic and atraumatic.  Eyes: Conjunctivae are normal.  Neck: Neck supple.  Cardiovascular: Normal rate, regular rhythm, normal heart sounds and intact distal pulses.   Pulmonary/Chest: Effort normal and breath sounds normal. No respiratory distress. She has no wheezes. She has no rales. She exhibits no tenderness.  Abdominal: Soft. Bowel sounds are normal. She exhibits no distension and no mass. There is no tenderness. There is no rebound and no guarding.  Neurological: She is alert and oriented to person, place, and  time.  Skin: Skin is warm and dry. No rash noted.  Psychiatric: Affect normal.    Recent Results (from the past 2160 hour(s))  POCT URINALYSIS DIPSTICK     Status: Abnormal   Collection Time    09/02/14  1:34 PM      Result Value Ref Range   Color, UA Yellow     Clarity, UA Cloudy     Glucose, UA Neg     Bilirubin, UA Neg     Ketones, UA Neg     Spec Grav, UA 1.020     Blood, UA Small     pH, UA 5.0     Protein, UA Neg     Urobilinogen, UA 0.2     Nitrite, UA Neg     Leukocytes, UA moderate (2+)      Assessment/Plan: Pain in female pelvis Giving history of colon cancer, will proceed directly to CT Abdomen/Pelvis w contrast to further assess.  Will obtain CBC, BMP today.  Urine dip with LE but negative for other factors.  Will send for culture.  Will treat if indicated by Cultures.  Continue Percocet for pain.  Follow-up based on results.

## 2014-09-02 NOTE — Assessment & Plan Note (Signed)
Giving history of colon cancer, will proceed directly to CT Abdomen/Pelvis w contrast to further assess.  Will obtain CBC, BMP today.  Urine dip with LE but negative for other factors.  Will send for culture.  Will treat if indicated by Cultures.  Continue Percocet for pain.  Follow-up based on results.

## 2014-09-02 NOTE — Progress Notes (Signed)
Pre visit review using our clinic review tool, if applicable. No additional management support is needed unless otherwise documented below in the visit note. 

## 2014-09-02 NOTE — Patient Instructions (Signed)
Please continue Percocet for pain.  Avoid overexertion.  Stop by the lab for blood work.  I will call you with your results.  Your urine has some abnormal findings which could represent infection, but needs further testing to assess.  Urine culture sent. You will be contacted for CT scan to further assess.  I will call you with all results.

## 2014-09-03 ENCOUNTER — Other Ambulatory Visit (HOSPITAL_BASED_OUTPATIENT_CLINIC_OR_DEPARTMENT_OTHER): Payer: Self-pay

## 2014-09-03 ENCOUNTER — Telehealth: Payer: Self-pay | Admitting: Physician Assistant

## 2014-09-03 ENCOUNTER — Ambulatory Visit (HOSPITAL_BASED_OUTPATIENT_CLINIC_OR_DEPARTMENT_OTHER)
Admission: RE | Admit: 2014-09-03 | Discharge: 2014-09-03 | Disposition: A | Discharge status: Home / Self Care | Payer: 59 | Source: Ambulatory Visit | Attending: Physician Assistant | Admitting: Physician Assistant

## 2014-09-03 DIAGNOSIS — R102 Pelvic and perineal pain: Secondary | ICD-10-CM | POA: Diagnosis not present

## 2014-09-03 DIAGNOSIS — Z85038 Personal history of other malignant neoplasm of large intestine: Secondary | ICD-10-CM | POA: Diagnosis not present

## 2014-09-03 LAB — URINE CULTURE: Colony Count: 2000

## 2014-09-03 MED ORDER — IOHEXOL 300 MG/ML  SOLN
100.0000 mL | Freq: Once | INTRAMUSCULAR | Status: AC | PRN
  Administered 2014-09-03: 100 mL via INTRAVENOUS

## 2014-09-03 NOTE — Telephone Encounter (Signed)
Labs so far look good.  Urine culture still pending.  Will call her once I have those results and the results of her CT scan.

## 2014-09-03 NOTE — Telephone Encounter (Signed)
LMOM with contact name and number [for return call, if needed] RE: results and further provider instructions/SLS  

## 2014-09-10 ENCOUNTER — Ambulatory Visit (INDEPENDENT_AMBULATORY_CARE_PROVIDER_SITE_OTHER): Payer: 59 | Admitting: Physician Assistant

## 2014-09-10 ENCOUNTER — Encounter: Payer: Self-pay | Admitting: Physician Assistant

## 2014-09-10 VITALS — BP 129/75 | HR 72 | Temp 98.2°F | Resp 16 | Ht 67.0 in | Wt 162.0 lb

## 2014-09-10 DIAGNOSIS — K5730 Diverticulosis of large intestine without perforation or abscess without bleeding: Secondary | ICD-10-CM | POA: Insufficient documentation

## 2014-09-10 DIAGNOSIS — K5909 Other constipation: Secondary | ICD-10-CM

## 2014-09-10 DIAGNOSIS — T402X5A Adverse effect of other opioids, initial encounter: Secondary | ICD-10-CM

## 2014-09-10 DIAGNOSIS — K5903 Drug induced constipation: Secondary | ICD-10-CM

## 2014-09-10 MED ORDER — CIPROFLOXACIN HCL 500 MG PO TABS: 500.0000 mg | ORAL_TABLET | Freq: Two times a day (BID) | ORAL | Status: DC

## 2014-09-10 NOTE — Progress Notes (Signed)
Pre visit review using our clinic review tool, if applicable. No additional management support is needed unless otherwise documented below in the visit note/SLS  

## 2014-09-10 NOTE — Patient Instructions (Signed)
Please take antibiotic as directed.  Take a daily probiotic.  Continue bowel regimen.  Increase fluids intake.  If symptoms are not improving, we need you to follow-up with Dr. Ardis Hughs as your imaging was unremarkable except for diverticulosis and stool burden.

## 2014-09-14 DIAGNOSIS — K5903 Drug induced constipation: Secondary | ICD-10-CM | POA: Insufficient documentation

## 2014-09-14 DIAGNOSIS — T402X5A Adverse effect of other opioids, initial encounter: Secondary | ICD-10-CM

## 2014-09-14 NOTE — Assessment & Plan Note (Signed)
Evidence of colonic stool burden on CT scam.  Discussed Rx medications for OIC but patient declines at present.  Will continue with stool softener and Miralax at present.  Will add on a probiotic.

## 2014-09-14 NOTE — Progress Notes (Signed)
Patient presents to clinic today for 1 week follow-up of LLQ pain.  Patient was seen last week where imaging was obtained giving patient's history. CT revealed moderate colonic stool burden but no other acute worrisome finding or finding suggesting malignancy.  Patient was instructed to start a bowel regimen to promote passage of stools.  Patient is on chronic narcotic pain management, given by her Oncologist which makes her at higher risk for chronic constipation.  Patient endorses beginning a gentle laxative.  Endorses some relief of symptoms with stool passage.  Is still having a cramping sensation in LLQ.  Denies fever, chills, diarrhea, tenesmus, melena or hematochezia.  Denies recent travel or abnormal food/water source.  Labs obtained at last visit including UA were unremarkable.   Past Medical History  Diagnosis Date  . Anemia   . Anxiety   . Arthritis   . Depression   . GERD (gastroesophageal reflux disease)   . Peptic ulcer   . Colon cancer   . COPD with emphysema 06/18/2012    Current Outpatient Prescriptions on File Prior to Visit  Medication Sig Dispense Refill  . ALPRAZolam (XANAX) 1 MG tablet Take 1 tablet (1 mg total) by mouth at bedtime as needed. 30 tablet 4  . BRINTELLIX 5 MG TABS TAKE 1 TABLET BY MOUTH TWICE A DAY 60 tablet 5  . Calcium-Vitamin D 600-125 MG-UNIT TABS Take by mouth daily.      . Fluticasone Furoate-Vilanterol (BREO ELLIPTA) 100-25 MCG/INH AEPB INHALE 1 PUFF BY MOUTH EVERY DAY 60 each 11  . gabapentin (NEURONTIN) 600 MG tablet TAKE 1 TABLET (600 MG TOTAL) BY MOUTH 4 (FOUR) TIMES DAILY. 120 tablet 3  . LORazepam (ATIVAN) 0.5 MG tablet Take 1 tablet (0.5 mg total) by mouth 2 (two) times daily as needed for anxiety. 30 tablet 0  . Multiple Vitamin (MULTIVITAMIN) tablet Take 1 tablet by mouth daily.      Marland Kitchen oxyCODONE-acetaminophen (PERCOCET) 10-325 MG per tablet 1 - 2 tabs every 4 hours as needed for pain 120 tablet 0  . pantoprazole (PROTONIX) 40 MG tablet  TAKE 1 TABLET BY MOUTH EVERY DAY 30 tablet 1  . Pyridoxine HCl (VITAMIN B-6) 250 MG tablet Take 1 tablet (250 mg total) by mouth daily. 30 tablet 12  . TURMERIC PO Take by mouth daily.     . vitamin E 200 UNIT capsule Take 200 Units by mouth daily.      . zaleplon (SONATA) 10 MG capsule Take 10 mg by mouth at bedtime as needed.     No current facility-administered medications on file prior to visit.    Allergies  Allergen Reactions  . Aspirin     Upset stomach  . Cymbalta [Duloxetine Hcl] Swelling    Swollen lips   . Prozac [Fluoxetine Hcl] Swelling    Lip swelling  . Wellbutrin [Bupropion] Swelling    Lip swelling    Family History  Problem Relation Age of Onset  . Diabetes Mother   . Colon cancer Cousin     History   Social History  . Marital Status: Married    Spouse Name: N/A    Number of Children: 3  . 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 Smoking status: Former Smoker -- 1.00 packs/day for 20 years    Types: Cigarettes    Start date: 04/23/1986    Quit date: 11/23/2005  . Smokeless tobacco:  Never Used     Comment: quit 8 years ago  . Alcohol Use: 0.0 oz/week     Comment: social  . Drug Use: No  . Sexual Activity: None   Other Topics Concern  . 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  All other ROS are negative.  BP 129/75 mmHg  Pulse 72  Temp(Src) 98.2 F (36.8 C) (Oral)  Resp 16  Ht 5\' 7"  (1.702 m)  Wt 162 lb (73.483 kg)  BMI 25.37 kg/m2  SpO2 98%  Physical Exam  Constitutional: She is oriented to person, place, and time and well-developed, well-nourished, and in no distress.  HENT:  Head: Normocephalic and atraumatic.  Cardiovascular: Normal rate, regular rhythm, normal heart sounds and intact distal pulses.   Pulmonary/Chest: Effort normal and  breath sounds normal. No respiratory distress. She has no wheezes. She has no rales. She exhibits no tenderness.  Abdominal: Soft. She exhibits no distension. Bowel sounds are hyperactive. There is no hepatosplenomegaly. There is tenderness in the left lower quadrant. There is no rigidity, no rebound, no guarding and no tenderness at McBurney's point. No hernia.  Neurological: She is alert and oriented to person, place, and time.  Skin: Skin is warm and dry. No rash noted.  Psychiatric: Affect normal.  Vitals reviewed.   Recent Results (from the past 2160 hour(s))  POCT Urinalysis Dipstick     Status: Abnormal   Collection Time: 09/02/14  1:34 PM  Result Value Ref Range   Color, UA Yellow    Clarity, UA Cloudy    Glucose, UA Neg    Bilirubin, UA Neg    Ketones, UA Neg    Spec Grav, UA 1.020    Blood, UA Small    pH, UA 5.0    Protein, UA Neg    Urobilinogen, UA 0.2    Nitrite, UA Neg    Leukocytes, UA moderate (2+)   Urine Culture     Status: None   Collection Time: 09/02/14  1:52 PM  Result Value Ref Range   Colony Count 2,000 COLONIES/ML    Organism ID, Bacteria Insignificant Growth   CBC w/Diff     Status: Abnormal   Collection Time: 09/02/14  1:52 PM  Result Value Ref Range   WBC 5.6 4.0 - 10.5 K/uL   RBC 5.78 (H) 3.87 - 5.11 Mil/uL   Hemoglobin 12.6 12.0 - 15.0 g/dL   HCT 41.1 36.0 - 46.0 %   MCV 71.1 (L) 78.0 - 100.0 fl   MCHC 30.6 30.0 - 36.0 g/dL   RDW 15.6 (H) 11.5 - 15.5 %   Platelets 159.0 150.0 - 400.0 K/uL   Neutrophils Relative % 60.5 43.0 - 77.0 %   Lymphocytes Relative 30.4 12.0 - 46.0 %   Monocytes Relative 5.8 3.0 - 12.0 %   Eosinophils Relative 3.2 0.0 - 5.0 %   Basophils Relative 0.1 0.0 - 3.0 %   Neutro Abs 3.4 1.4 - 7.7 K/uL   Lymphs Abs 1.7 0.7 - 4.0 K/uL   Monocytes Absolute 0.3 0.1 - 1.0 K/uL   Eosinophils Absolute 0.2 0.0 - 0.7 K/uL   Basophils Absolute 0.0 0.0 - 0.1 K/uL  Basic Metabolic Panel (BMET)     Status: Abnormal   Collection Time:  09/02/14  1:52 PM  Result Value Ref Range   Sodium 139 135 - 145 mEq/L   Potassium 3.5 3.5 - 5.1 mEq/L   Chloride 104 96 - 112 mEq/L   CO2 27 19 - 32 mEq/L   Glucose, Bld 78 70 - 99 mg/dL   BUN 17 6 - 23 mg/dL   Creatinine, Ser 1.0 0.4 - 1.2 mg/dL   Calcium 9.0 8.4 - 10.5 mg/dL   GFR 58.86 (L) >60.00 mL/min    Assessment/Plan: Diverticulosis of large intestine without hemorrhage Giving persistence of LLQ pain, suspect potential symptomatic diverticular disease versus enteritis.  CT ruled out full-blown diverticulitis. Will give short course of Ciprofloxacin and see if symptoms respond.  Continue bowel regimen for constipation.  Therapeutic opioid induced constipation Evidence of colonic stool burden on CT scam.  Discussed Rx medications for OIC but patient declines at present.  Will continue with stool softener and Miralax at present.  Will add on a probiotic.

## 2014-09-14 NOTE — Assessment & Plan Note (Signed)
Giving persistence of LLQ pain, suspect potential symptomatic diverticular disease versus enteritis.  CT ruled out full-blown diverticulitis. Will give short course of Ciprofloxacin and see if symptoms respond.  Continue bowel regimen for constipation.

## 2014-09-22 ENCOUNTER — Ambulatory Visit (HOSPITAL_BASED_OUTPATIENT_CLINIC_OR_DEPARTMENT_OTHER): Payer: 59 | Admitting: Family

## 2014-09-22 ENCOUNTER — Encounter: Payer: Self-pay | Admitting: Family

## 2014-09-22 ENCOUNTER — Ambulatory Visit (HOSPITAL_BASED_OUTPATIENT_CLINIC_OR_DEPARTMENT_OTHER)
Admission: RE | Admit: 2014-09-22 | Discharge: 2014-09-22 | Disposition: A | Discharge status: Home / Self Care | Payer: 59 | Source: Ambulatory Visit | Attending: Hematology & Oncology | Admitting: Hematology & Oncology

## 2014-09-22 ENCOUNTER — Other Ambulatory Visit (HOSPITAL_BASED_OUTPATIENT_CLINIC_OR_DEPARTMENT_OTHER): Payer: 59 | Admitting: Lab

## 2014-09-22 ENCOUNTER — Other Ambulatory Visit: Payer: Self-pay | Admitting: *Deleted

## 2014-09-22 DIAGNOSIS — R911 Solitary pulmonary nodule: Secondary | ICD-10-CM

## 2014-09-22 DIAGNOSIS — C180 Malignant neoplasm of cecum: Secondary | ICD-10-CM

## 2014-09-22 DIAGNOSIS — Z85038 Personal history of other malignant neoplasm of large intestine: Secondary | ICD-10-CM | POA: Insufficient documentation

## 2014-09-22 DIAGNOSIS — R918 Other nonspecific abnormal finding of lung field: Secondary | ICD-10-CM | POA: Diagnosis not present

## 2014-09-22 DIAGNOSIS — G629 Polyneuropathy, unspecified: Secondary | ICD-10-CM

## 2014-09-22 DIAGNOSIS — F17210 Nicotine dependence, cigarettes, uncomplicated: Secondary | ICD-10-CM | POA: Insufficient documentation

## 2014-09-22 DIAGNOSIS — J439 Emphysema, unspecified: Secondary | ICD-10-CM | POA: Diagnosis not present

## 2014-09-22 LAB — CBC WITH DIFFERENTIAL (CANCER CENTER ONLY)
BASO#: 0 10*3/uL (ref 0.0–0.2)
BASO%: 0.2 % (ref 0.0–2.0)
EOS%: 2.3 % (ref 0.0–7.0)
Eosinophils Absolute: 0.1 10*3/uL (ref 0.0–0.5)
HCT: 40.5 % (ref 34.8–46.6)
HGB: 13.1 g/dL (ref 11.6–15.9)
LYMPH#: 1.6 10*3/uL (ref 0.9–3.3)
LYMPH%: 27 % (ref 14.0–48.0)
MCH: 22.5 pg — ABNORMAL LOW (ref 26.0–34.0)
MCHC: 32.3 g/dL (ref 32.0–36.0)
MCV: 70 fL — ABNORMAL LOW (ref 81–101)
MONO#: 0.4 10*3/uL (ref 0.1–0.9)
MONO%: 6.1 % (ref 0.0–13.0)
NEUT%: 64.4 % (ref 39.6–80.0)
NEUTROS ABS: 3.7 10*3/uL (ref 1.5–6.5)
PLATELETS: 155 10*3/uL (ref 145–400)
RBC: 5.81 10*6/uL — ABNORMAL HIGH (ref 3.70–5.32)
RDW: 15.9 % — AB (ref 11.1–15.7)
WBC: 5.7 10*3/uL (ref 3.9–10.0)

## 2014-09-22 LAB — CMP (CANCER CENTER ONLY)
ALBUMIN: 3.8 g/dL (ref 3.3–5.5)
ALK PHOS: 70 U/L (ref 26–84)
ALT: 13 U/L (ref 10–47)
AST: 18 U/L (ref 11–38)
BUN, Bld: 11 mg/dL (ref 7–22)
CALCIUM: 8.8 mg/dL (ref 8.0–10.3)
CHLORIDE: 104 meq/L (ref 98–108)
CO2: 28 mEq/L (ref 18–33)
Creat: 0.8 mg/dl (ref 0.6–1.2)
Glucose, Bld: 84 mg/dL (ref 73–118)
POTASSIUM: 3.3 meq/L (ref 3.3–4.7)
SODIUM: 141 meq/L (ref 128–145)
TOTAL PROTEIN: 7.2 g/dL (ref 6.4–8.1)
Total Bilirubin: 0.6 mg/dl (ref 0.20–1.60)

## 2014-09-22 LAB — CEA: CEA: 2.2 ng/mL (ref 0.0–5.0)

## 2014-09-22 MED ORDER — OXYCODONE-ACETAMINOPHEN 10-325 MG PO TABS: ORAL_TABLET | ORAL | Status: DC

## 2014-09-23 ENCOUNTER — Telehealth: Payer: Self-pay | Admitting: *Deleted

## 2014-09-23 NOTE — Progress Notes (Signed)
Teresa Morgan  [phone] [phone]  ID: Teresa Morgan OB: 01-06-1956 MR#: [id number] MKL#:[id number] Patient Care Team: Debbrah Alar, NP as PCP - General (Internal Medicine) Elsie Stain, MD as Attending Physician (Pulmonary Disease) Volanda Napoleon, MD as Consulting Physician (Oncology)  DIAGNOSIS: 1. Stage IIIB (T2 N2 M0) adenocarcinoma of the cecum. 2. Neuropathy secondary to chemotherapy.  INTERVAL HISTORY: Teresa Morgan is here today for a follow-up. She is doing well. She has had some left sided muscle pain. CT of that area was negative. Scans also showed that her right lung nodule is unchanged. CEA today was 2.2.  She denies fever, chills, n/v, cough, rash, headache, dizziness, chest pain, palpitations, abdominal pain, constipation, diarrhea, blood in urine or stool. She has emphysema with SOB but this is unchanged. No swelling or tenderness in her extremities. Her neuropathy is better with the Neurontin and Percocet. Her appetite is good and she is staying hydrated. Overall, she seems to be doing quite well.   CURRENT TREATMENT: Observation  REVIEW OF SYSTEMS: All other 10 point review of systems is negative.   PAST MEDICAL HISTORY: Past Medical History  Diagnosis Date  . Anemia   . Anxiety   . Arthritis   . Depression   . GERD (gastroesophageal reflux disease)   . Peptic ulcer   . Colon cancer   . COPD with emphysema 06/18/2012    PAST SURGICAL HISTORY: Past Surgical History  Procedure Laterality Date  . Heel spurs Right   . Hemrrhoids    . Plantar fascia surgery Right 2009  . Colon resection  2012    for colon cancer  . Tubal ligation  07/1980  . Appendectomy  07/07/11  . Portacath      FAMILY HISTORY Family History  Problem Relation Age of Onset  . Diabetes Mother   . Colon cancer Cousin     GYNECOLOGIC HISTORY:  No LMP recorded. Patient is postmenopausal.   SOCIAL HISTORY: History   Social History  . Marital  Status: Married    Spouse Name: N/A    Number of Children: 3  . 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 Smoking status: Former Smoker -- 1.00 packs/day for 20 years    Types: Cigarettes    Start date: 04/23/1986    Quit date: 11/23/2005  . Smokeless tobacco: Never Used     Comment: quit 8 years ago  . Alcohol Use: 0.0 oz/week     Comment: social  . Drug Use: No  . Sexual Activity: Not on file   Other Topics Concern  . 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 Smoking status: Former Smoker -- 1.00 packs/day for 20 years    Types: Cigarettes    Start date: 04/23/1986    Quit date: 11/23/2005  . Smokeless tobacco: Never Used     Comment: quit 8 years ago  . Alcohol Use: 0.0 oz/week     Comment: social   Colonoscopy: PAP: Bone density: Lipid panel:  Allergies  Allergen Reactions  . Aspirin     Upset stomach  . Cymbalta [Duloxetine Hcl] Swelling    Swollen lips   .  Prozac [Fluoxetine Hcl] Swelling    Lip swelling  . Wellbutrin [Bupropion] Swelling    Lip swelling    Current Outpatient Prescriptions  Medication Sig Dispense Refill  . ALPRAZolam (XANAX) 1 MG tablet Take 1 tablet (1 mg total) by mouth at bedtime as needed. 30 tablet 4  . BRINTELLIX 5 MG TABS TAKE 1 TABLET BY MOUTH TWICE A DAY 60 tablet 5  . Calcium-Vitamin D 600-125 MG-UNIT TABS Take by mouth daily.      . Fluticasone Furoate-Vilanterol (BREO ELLIPTA) 100-25 MCG/INH AEPB INHALE 1 PUFF BY MOUTH EVERY DAY 60 each 11  . gabapentin (NEURONTIN) 600 MG tablet TAKE 1 TABLET (600 MG TOTAL) BY MOUTH 4 (FOUR) TIMES DAILY. 120 tablet 3  . LORazepam (ATIVAN) 0.5 MG tablet Take 1 tablet (0.5 mg total) by mouth 2 (two) times daily as needed for anxiety.  30 tablet 0  . Multiple Vitamin (MULTIVITAMIN) tablet Take 1 tablet by mouth daily.      Marland Kitchen oxyCODONE-acetaminophen (PERCOCET) 10-325 MG per tablet 1 - 2 tabs every 4 hours as needed for pain 120 tablet 0  . pantoprazole (PROTONIX) 40 MG tablet TAKE 1 TABLET BY MOUTH EVERY DAY 30 tablet 1  . Pyridoxine HCl (VITAMIN B-6) 250 MG tablet Take 1 tablet (250 mg total) by mouth daily. 30 tablet 12  . TURMERIC PO Take by mouth daily.     . vitamin E 200 UNIT capsule Take 200 Units by mouth daily.      . zaleplon (SONATA) 10 MG capsule Take 10 mg by mouth at bedtime as needed.     No current facility-administered medications for this visit.    OBJECTIVE: Filed Vitals:   09/22/14 1219  BP: 125/79  Pulse: 66  Temp: 98 F (36.7 C)  Resp: 14    Filed Weights   09/22/14 1219  Weight: 159 lb (72.122 kg)   ECOG FS:0 - Asymptomatic Ocular: Sclerae unicteric, pupils equal, round and reactive to light Ear-nose-throat: Oropharynx clear, dentition fair Lymphatic: No cervical or supraclavicular adenopathy Lungs no rales or rhonchi, good excursion bilaterally Heart regular rate and rhythm, no murmur appreciated Abd soft, nontender, positive bowel sounds MSK no focal spinal tenderness, no joint edema Neuro: non-focal, well-oriented, appropriate affect Breasts: Deferred  LAB RESULTS: CMP     Component Value Date/Time   NA 141 09/22/2014 1146   NA 139 09/02/2014 1352   K 3.3 09/22/2014 1146   K 3.5 09/02/2014 1352   CL 104 09/22/2014 1146   CL 104 09/02/2014 1352   CO2 28 09/22/2014 1146   CO2 27 09/02/2014 1352   GLUCOSE 84 09/22/2014 1146   GLUCOSE 78 09/02/2014 1352   BUN 11 09/22/2014 1146   BUN 17 09/02/2014 1352   CREATININE 0.8 09/22/2014 1146   CREATININE 1.0 09/02/2014 1352   CALCIUM 8.8 09/22/2014 1146   CALCIUM 9.0 09/02/2014 1352   PROT 7.2 09/22/2014 1146   PROT 6.6 05/22/2014 1331   ALBUMIN 4.3 05/22/2014 1331   AST 18 09/22/2014 1146   AST 16 05/22/2014 1331   ALT 13  09/22/2014 1146   ALT 15 05/22/2014 1331   ALKPHOS 70 09/22/2014 1146   ALKPHOS 75 05/22/2014 1331   BILITOT 0.60 09/22/2014 1146   BILITOT 0.5 05/22/2014 1331   GFRNONAA >60 07/09/2011 0700   GFRAA >60 07/09/2011 0700   INo results found for: SPEP, UPEP Lab Results  Component Value Date   WBC 5.7 09/22/2014   NEUTROABS 3.7 09/22/2014   HGB  13.1 09/22/2014   HCT 40.5 09/22/2014   MCV 70* 09/22/2014   PLT 155 09/22/2014   No results found for: LABCA2 No components found for: [id number] No results for input(s): INR in the last 168 hours.  STUDIES:  ASSESSMENT/PLAN: Teresa Morgan is 58 year old female with stage IIIB colon cancer of the cecum. She underwent resection back in September of 2012. She had 4 positive lymph nodes. She completed chemotherapy in March of 2013. We will Her CBC and CMP today were ok. CEA was 2.2.  She has no signs of recurrence at this time.   Recent Ct scans of chest, abdomen and pelvis showed showed stable pulmonary nodules and no evidence of metastatic disease We will see her back in 6 months for labs and follow-up.  She knows to call here with any questions or concerns and to go to the ED in the event of an emergency. We can certainly see her back sooner if need be.   Eliezer Bottom, NP 09/23/2014 9:58 AM

## 2014-09-23 NOTE — Telephone Encounter (Addendum)
-----   Message from Volanda Napoleon, MD sent at 09/22/2014  4:10 PM EST ----- Please call and let him know that the CAT scan does not show any changes with respect to the last scan. She still has some emphysema changes. The lung nodules are still very small and unchanged in size. Pete -Left voicemail informing pt that CAT scan does not show any changes with respect to the last scan. She still has some emphysema changes. The lung nodules are still very small and unchanged in size. Informed pt to call our office if she has any questions.

## 2014-10-08 ENCOUNTER — Telehealth: Payer: Self-pay | Admitting: Hematology & Oncology

## 2014-10-08 NOTE — Telephone Encounter (Signed)
Pt moved flush from 11-27 to 12-2

## 2014-10-14 ENCOUNTER — Other Ambulatory Visit: Payer: Self-pay | Admitting: Pharmacist

## 2014-10-15 ENCOUNTER — Other Ambulatory Visit: Payer: Self-pay | Admitting: Hematology & Oncology

## 2014-10-15 ENCOUNTER — Ambulatory Visit (HOSPITAL_BASED_OUTPATIENT_CLINIC_OR_DEPARTMENT_OTHER): Payer: 59

## 2014-10-15 VITALS — BP 120/63 | HR 77 | Temp 97.9°F | Resp 18

## 2014-10-15 DIAGNOSIS — Z452 Encounter for adjustment and management of vascular access device: Secondary | ICD-10-CM

## 2014-10-15 DIAGNOSIS — C180 Malignant neoplasm of cecum: Secondary | ICD-10-CM

## 2014-10-15 MED ORDER — HEPARIN SOD (PORK) LOCK FLUSH 100 UNIT/ML IV SOLN
500.0000 [IU] | INTRAVENOUS | Status: AC | PRN
  Administered 2014-10-15: 500 [IU]
  Filled 2014-10-15: qty 5

## 2014-10-15 MED ORDER — SODIUM CHLORIDE 0.9 % IJ SOLN
10.0000 mL | INTRAMUSCULAR | Status: AC | PRN
  Administered 2014-10-15: 10 mL
  Filled 2014-10-15: qty 10

## 2014-10-15 NOTE — Patient Instructions (Signed)

## 2014-10-23 ENCOUNTER — Other Ambulatory Visit: Payer: Self-pay | Admitting: Nurse Practitioner

## 2014-10-23 DIAGNOSIS — G629 Polyneuropathy, unspecified: Secondary | ICD-10-CM

## 2014-10-23 DIAGNOSIS — C180 Malignant neoplasm of cecum: Secondary | ICD-10-CM

## 2014-10-23 MED ORDER — OXYCODONE-ACETAMINOPHEN 10-325 MG PO TABS: ORAL_TABLET | ORAL | Status: DC

## 2014-10-23 MED ORDER — LORAZEPAM 0.5 MG PO TABS: 0.5000 mg | ORAL_TABLET | Freq: Two times a day (BID) | ORAL | Status: DC | PRN

## 2014-10-27 ENCOUNTER — Other Ambulatory Visit: Payer: Self-pay | Admitting: Nurse Practitioner

## 2014-10-30 ENCOUNTER — Ambulatory Visit (INDEPENDENT_AMBULATORY_CARE_PROVIDER_SITE_OTHER): Payer: 59 | Admitting: Nurse Practitioner

## 2014-10-30 ENCOUNTER — Encounter: Payer: Self-pay | Admitting: Nurse Practitioner

## 2014-10-30 VITALS — BP 92/60 | HR 93 | Temp 99.8°F | Ht 67.0 in | Wt 162.0 lb

## 2014-10-30 DIAGNOSIS — K5900 Constipation, unspecified: Secondary | ICD-10-CM

## 2014-10-30 DIAGNOSIS — J111 Influenza due to unidentified influenza virus with other respiratory manifestations: Secondary | ICD-10-CM

## 2014-10-30 MED ORDER — OSELTAMIVIR PHOSPHATE 75 MG PO CAPS: 75.0000 mg | ORAL_CAPSULE | Freq: Two times a day (BID) | ORAL | Status: DC

## 2014-10-30 MED ORDER — POLYETHYLENE GLYCOL 3350 17 GM/SCOOP PO POWD: 17.0000 g | Freq: Two times a day (BID) | ORAL | Status: DC | PRN

## 2014-10-30 MED ORDER — BENZONATATE 100 MG PO CAPS: ORAL_CAPSULE | ORAL | Status: DC

## 2014-10-30 NOTE — Patient Instructions (Signed)
You likely have flu. The average duration is 5-10 days. Treatment is largely symptom management. For sinus congestion, start daily sinus rinses (neilmed Sinus Rinse) & 30 mg to 60 mg pseudoephedrine twice daily. For sore throat use benzocaine throat lozenges or spray. For aches & fever alternate tylenol & ibuprophen every 4-6 hours. For cough, you may use a spoonful of honey thinned with lemon juice or hot tea, or benzonatate capsules as prescribed. Sip fluids every hour. Rest. If you are not feeling better in 1 week or develop fever or chest pain, call us for re-evaluation.  Take miralax as directed: 1 capful every hour until you have bowel movement. You may take 4 doses today.  Feel better!    Influenza A (H1N1) H1N1 formerly called "swine flu" is a new influenza virus causing sickness in people. The H1N1 virus is different from seasonal influenza viruses. However, the H1N1 symptoms are similar to seasonal influenza and it is spread from person to person. You may be at higher risk for serious problems if you have underlying serious medical conditions. The CDC and the [facility] are following reported cases around the world. CAUSES   The flu is thought to spread mainly person-to-person through coughing or sneezing of infected people.  A person may become infected by touching something with the virus on it and then touching their mouth or nose. SYMPTOMS   Fever.  Headache.  Tiredness.  Cough.  Sore throat.  Runny or stuffy nose.  Body aches.  Diarrhea and vomiting These symptoms are referred to as "flu-like symptoms." A lot of different illnesses, including the common cold, may have similar symptoms. DIAGNOSIS   There are tests that can tell if you have the H1N1 virus.  Confirmed cases of H1N1 will be reported to the state or local health department.  A doctor's exam may be needed to tell whether you have an infection that is a complication of the flu. HOME CARE  INSTRUCTIONS   Stay informed. Visit the [facility] website for current recommendations. Visit [facility]. You may also call 1-800-CDC-INFO (581)488-8887).  Get help early if you develop any of the above symptoms.  If you are at high risk from complications of the flu, talk to your caregiver as soon as you develop flu-like symptoms. Those at higher risk for complications include:  People 65 years or older.  People with chronic medical conditions.  Pregnant women.  Young children.  Your caregiver may recommend antiviral medicine to help treat the flu.  If you get the flu, get plenty of rest, drink enough water and fluids to keep your urine clear or pale yellow, and avoid using alcohol or tobacco.  You may take over-the-counter medicine to relieve the symptoms of the flu if your caregiver approves. (Never give aspirin to children or teenagers who have flu-like symptoms, particularly fever). TREATMENT  If you do get sick, antiviral drugs are available. These drugs can make your illness milder and make you feel better faster. Treatment should start soon after illness starts. It is only effective if taken within the first day of becoming ill. Only your caregiver can prescribe antiviral medication.  PREVENTION   Cover your nose and mouth with a tissue or your arm when you cough or sneeze. Throw the tissue away.  Wash your hands often with soap and warm water, especially after you cough or sneeze. Alcohol-based cleaners are also effective against germs.  Avoid touching your eyes, nose or mouth. This is one way germs spread.  Try to avoid contact with sick people. Follow public health advice regarding school closures. Avoid crowds.  Stay home if you get sick. Limit contact with others to keep from infecting them. People infected with the H1N1 virus may be able to infect others anywhere from 1 day before feeling sick to 5-7 days after getting flu symptoms.  An H1N1 vaccine is available  to help protect against the virus. In addition to the H1N1 vaccine, you will need to be vaccinated for seasonal influenza. The H1N1 and seasonal vaccines may be given on the same day. The CDC especially recommends the H1N1 vaccine for:  Pregnant women.  People who live with or care for children younger than 58 months of age.  Health care and emergency services personnel.  Persons between the ages of 31 months through 33 years of age.  People from ages 22 through 19 years who are at higher risk for H1N1 because of chronic health disorders or immune system problems. FACEMASKS In community and home settings, the use of facemasks and N95 respirators are not normally recommended. In certain circumstances, a facemask or N95 respirator may be used for persons at increased risk of severe illness from influenza. Your caregiver can give additional recommendations for facemask use. IN CHILDREN, EMERGENCY WARNING SIGNS THAT NEED URGENT MEDICAL CARE:  Fast breathing or trouble breathing.  Bluish skin color.  Not drinking enough fluids.  Not waking up or not interacting normally.  Being so fussy that the child does not want to be held.  Your child has an oral temperature above 102 F (38.9 C), not controlled by medicine.  Your baby is older than 3 months with a rectal temperature of 102 F (38.9 C) or higher.  Your baby is 34 months old or younger with a rectal temperature of 100.4 F (38 C) or higher.  Flu-like symptoms improve but then return with fever and worse cough. IN ADULTS, EMERGENCY WARNING SIGNS THAT NEED URGENT MEDICAL CARE:  Difficulty breathing or shortness of breath.  Pain or pressure in the chest or abdomen.  Sudden dizziness.  Confusion.  Severe or persistent vomiting.  Bluish color.  You have a oral temperature above 102 F (38.9 C), not controlled by medicine.  Flu-like symptoms improve but return with fever and worse cough. SEEK IMMEDIATE MEDICAL CARE IF:  You  or someone you know is experiencing any of the above symptoms. When you arrive at the emergency center, report that you think you have the flu. You may be asked to wear a mask and/or sit in a secluded area to protect others from getting sick. MAKE SURE YOU:   Understand these instructions.  Will watch your condition.  Will get help right away if you are not doing well or get worse. Some of this information courtesy of the CDC.  Document Released: 04/18/2008 Document Revised: 01/23/2012 Document Reviewed: 04/18/2008 [facility] Patient Information 2014 [place], [place].

## 2014-10-30 NOTE — Progress Notes (Signed)
   Subjective:    Patient ID: Teresa Morgan, female    DOB: 17-Jul-1956, 57 y.o.   MRN: [id number]  HPI Comments: Pt is accompanied by husband & daughter today. Pt also c/o constipation for 4 days. She has been taking "phillips" capsules without relief.    Fever  This is a new problem. The current episode started in the past 7 days (3d). The problem occurs intermittently. The problem has been waxing and waning. The maximum temperature noted was 99 to 99.9 F. The temperature was taken using an oral thermometer. Associated symptoms include coughing, headaches and muscle aches. Pertinent negatives include no abdominal pain, congestion, diarrhea, ear pain, nausea, sore throat or wheezing. She has tried NSAIDs (600 mg) for the symptoms. The treatment provided no relief.      Review of Systems  Constitutional: Positive for fever, appetite change and fatigue.  HENT: Negative for congestion, ear pain, postnasal drip and sore throat.   Respiratory: Positive for cough. Negative for wheezing.   Gastrointestinal: Positive for constipation. Negative for nausea, abdominal pain and diarrhea.  Musculoskeletal: Positive for myalgias. Negative for arthralgias.  Neurological: Positive for headaches.       Objective:   Physical Exam  Constitutional: She is oriented to person, place, and time. She appears well-developed and well-nourished. No distress.  HENT:  Head: Normocephalic and atraumatic.  Right Ear: External ear normal.  Left Ear: External ear normal.  Mouth/Throat: No oropharyngeal exudate.  Posterior pharynx erthematous  Eyes: Conjunctivae are normal. Right eye exhibits no discharge. Left eye exhibits no discharge.  Neck: Normal range of motion. Neck supple. No thyromegaly present.  Cardiovascular: Normal rate, regular rhythm and normal heart sounds.   No murmur heard. Pulmonary/Chest: Effort normal and breath sounds normal. No respiratory distress. She has no wheezes. She has no rales.    Lymphadenopathy:    She has no cervical adenopathy.  Neurological: She is alert and oriented to person, place, and time.  Skin: Skin is warm and dry.  Psychiatric: She has a normal mood and affect. Her behavior is normal. Thought content normal.  Vitals reviewed.         Assessment & Plan:  1. Influenza - oseltamivir (TAMIFLU) 75 MG capsule; Take 1 capsule (75 mg total) by mouth 2 (two) times daily.  Dispense: 10 capsule; Refill: 0 - benzonatate (TESSALON) 100 MG capsule; Take 1-2 capsules po up to 3 times daily PRN cough  Dispense: 60 capsule; Refill: 0 Work note written  2. Constipation, unspecified constipation type - polyethylene glycol powder (GLYCOLAX/MIRALAX) powder; Take 17 g by mouth 2 (two) times daily as needed.  Dispense: 3350 g; Refill: 1 See pt instructions.  F/u PRN

## 2014-10-30 NOTE — Progress Notes (Signed)
Pre visit review using our clinic review tool, if applicable. No additional management support is needed unless otherwise documented below in the visit note. 

## 2014-11-17 ENCOUNTER — Ambulatory Visit (INDEPENDENT_AMBULATORY_CARE_PROVIDER_SITE_OTHER): Payer: 59 | Admitting: Family

## 2014-11-17 ENCOUNTER — Encounter: Payer: Self-pay | Admitting: Family

## 2014-11-17 ENCOUNTER — Telehealth: Payer: Self-pay | Admitting: Family

## 2014-11-17 VITALS — BP 118/74 | HR 90 | Temp 97.9°F | Resp 16 | Ht 67.5 in | Wt 161.4 lb

## 2014-11-17 DIAGNOSIS — K2970 Gastritis, unspecified, without bleeding: Secondary | ICD-10-CM

## 2014-11-17 DIAGNOSIS — F32A Depression, unspecified: Secondary | ICD-10-CM

## 2014-11-17 DIAGNOSIS — F329 Major depressive disorder, single episode, unspecified: Secondary | ICD-10-CM

## 2014-11-17 DIAGNOSIS — J309 Allergic rhinitis, unspecified: Secondary | ICD-10-CM | POA: Insufficient documentation

## 2014-11-17 DIAGNOSIS — J111 Influenza due to unidentified influenza virus with other respiratory manifestations: Secondary | ICD-10-CM

## 2014-11-17 MED ORDER — BENZONATATE 100 MG PO CAPS: 100.0000 mg | ORAL_CAPSULE | Freq: Three times a day (TID) | ORAL | Status: DC

## 2014-11-17 NOTE — Assessment & Plan Note (Signed)
Fair control. Advise follow up with psych as scheduled.

## 2014-11-17 NOTE — Progress Notes (Signed)
Pre visit review using our clinic review tool, if applicable. No additional management support is needed unless otherwise documented below in the visit note. 

## 2014-11-17 NOTE — Telephone Encounter (Signed)
Could we please try to get records from her EGD? And GI consult?

## 2014-11-17 NOTE — Patient Instructions (Addendum)
Add claritin 10mg  once daily for nasal congestion along with flonase 2 sprays each nostril once daily. Call if symptoms worse or if not improved in 1 week.  Please schedule a follow up appointment in 3 months.

## 2014-11-17 NOTE — Assessment & Plan Note (Signed)
Reports neg EGD- will request GI records, continue PPI. Fair control.

## 2014-11-17 NOTE — Progress Notes (Signed)
Subjective:    Patient ID: Teresa Morgan, female    DOB: 1956/02/26, 59 y.o.   MRN: [id number]  HPI  Teresa Morgan is a 59 yr old female who presents today for follow up of her depression. She is maintained on Brintellix and is followed at [facility].  Reports that she is not sleeping well. She reports that she takes lorazepam prn at work.  Takes alprazolam to help her sleep at night which helps.  She denies SI/HI.   She did recently lose her balance and tripped over a stump outside.   She does report + "sinus headache."  This has been present x 2 weeks. Was diagnosed with the flu on 12/17.  + frontal sinus pressure. Denies fever.  Mild nasal drainage but nose "keep running."   Epigastric pain- unchanged, she continues protonix. Had EGD and was told OK. Reports that she thinks that stress is a contributing to her symptoms.   Review of Systems    see HPI Past Medical History  Diagnosis Date  . Anemia   . Anxiety   . Arthritis   . Depression   . GERD (gastroesophageal reflux disease)   . Peptic ulcer   . Colon cancer   . COPD with emphysema 06/18/2012    History   Social History  . Marital Status: Married    Spouse Name: N/A    Number of Children: 3  . 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 Smoking status: Former Smoker -- 1.00 packs/day for 20 years    Types: Cigarettes    Start date: 04/23/1986    Quit date: 11/23/2005  . Smokeless tobacco: Never Used     Comment: quit 8 years ago  . Alcohol Use: 0.0 oz/week     Comment: social  . Drug Use: No  . Sexual Activity: Not on file   Other Topics Concern  . 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 Heel spurs Right   . Hemrrhoids    . Plantar fascia surgery Right 2009  . Colon resection   2012    for colon cancer  . Tubal ligation  07/1980  . Appendectomy  07/07/11  . Portacath      Family History  Problem Relation Age of Onset  . Diabetes Mother   . Colon cancer Cousin     Allergies  Allergen Reactions  . Aspirin     Upset stomach  . Cymbalta [Duloxetine Hcl] Swelling    Swollen lips   . Prozac [Fluoxetine Hcl] Swelling    Lip swelling  . Wellbutrin [Bupropion] Swelling    Lip swelling    Current Outpatient Prescriptions on File Prior to Visit  Medication Sig Dispense Refill  . ALPRAZolam (XANAX) 1 MG tablet Take 1 tablet (1 mg total) by mouth at bedtime as needed. 30 tablet 4  . benzonatate (TESSALON) 100 MG capsule Take 1-2 capsules po up to 3 times daily PRN cough (Patient not taking: Reported on 11/17/2014) 60 capsule 0  . BRINTELLIX 10 MG TABS Take 1 tablet by mouth daily.  2  . Calcium-Vitamin D 600-125 MG-UNIT TABS Take by mouth daily.      . Fluticasone  Furoate-Vilanterol (BREO ELLIPTA) 100-25 MCG/INH AEPB INHALE 1 PUFF BY MOUTH EVERY DAY 60 each 11  . gabapentin (NEURONTIN) 600 MG tablet TAKE 1 TABLET BY MOUTH 4 TIMES DAILY 120 tablet 3  . LORazepam (ATIVAN) 0.5 MG tablet Take 1 tablet (0.5 mg total) by mouth 2 (two) times daily as needed for anxiety. 30 tablet 0  . Multiple Vitamin (MULTIVITAMIN) tablet Take 1 tablet by mouth daily.      Marland Kitchen oxyCODONE-acetaminophen (PERCOCET) 10-325 MG per tablet 1 - 2 tabs every 4 hours as needed for pain 120 tablet 0  . pantoprazole (PROTONIX) 40 MG tablet TAKE 1 TABLET BY MOUTH EVERY DAY 30 tablet 1  . polyethylene glycol powder (GLYCOLAX/MIRALAX) powder Take 17 g by mouth 2 (two) times daily as needed. 3350 g 1  . Pyridoxine HCl (VITAMIN B-6) 250 MG tablet Take 1 tablet (250 mg total) by mouth daily. 30 tablet 12  . TURMERIC PO Take by mouth daily.     . vitamin E 200 UNIT capsule Take 200 Units by mouth daily.      . zaleplon (SONATA) 10 MG capsule Take 10 mg by mouth at bedtime as needed.     No current  facility-administered medications on file prior to visit.    BP 118/74 mmHg  Pulse 90  Temp(Src) 97.9 F (36.6 C) (Oral)  Resp 16  Ht 5' 7.5" (1.715 m)  Wt 161 lb 6.4 oz (73.211 kg)  BMI 24.89 kg/m2  SpO2 99%    Objective:   Physical Exam  Constitutional: She is oriented to person, place, and time. She appears well-developed and well-nourished. No distress.  HENT:  Head: Normocephalic and atraumatic.  No maxillary or sinus tenderness to palpation  Cardiovascular: Normal rate and regular rhythm.   No murmur heard. Pulmonary/Chest: Effort normal and breath sounds normal. No respiratory distress. She has no wheezes. She has no rales. She exhibits no tenderness.  Lymphadenopathy:    She has no cervical adenopathy.  Neurological: She is alert and oriented to person, place, and time.  Psychiatric: She has a normal mood and affect. Her behavior is normal. Judgment and thought content normal.          Assessment & Plan:

## 2014-11-17 NOTE — Assessment & Plan Note (Signed)
No obvious sinusitis. Advised trial of claritin and flonase. Follow up if symptoms worsen.

## 2014-11-18 NOTE — Telephone Encounter (Signed)
There is an EGD result under the media tab in chart review with Dr Ardis Hughs from 03/2014.

## 2014-11-25 ENCOUNTER — Other Ambulatory Visit: Payer: Self-pay | Admitting: *Deleted

## 2014-11-25 DIAGNOSIS — G629 Polyneuropathy, unspecified: Secondary | ICD-10-CM

## 2014-11-25 DIAGNOSIS — C180 Malignant neoplasm of cecum: Secondary | ICD-10-CM

## 2014-11-25 MED ORDER — OXYCODONE-ACETAMINOPHEN 10-325 MG PO TABS: ORAL_TABLET | ORAL | Status: DC

## 2014-12-08 ENCOUNTER — Other Ambulatory Visit: Payer: Self-pay | Admitting: Critical Care Medicine

## 2014-12-10 ENCOUNTER — Other Ambulatory Visit: Payer: Self-pay | Admitting: Family

## 2014-12-10 NOTE — Telephone Encounter (Signed)
Rx request to pharmacy/SLS  

## 2014-12-25 ENCOUNTER — Other Ambulatory Visit: Payer: Self-pay | Admitting: *Deleted

## 2014-12-25 DIAGNOSIS — G629 Polyneuropathy, unspecified: Secondary | ICD-10-CM

## 2014-12-25 DIAGNOSIS — C180 Malignant neoplasm of cecum: Secondary | ICD-10-CM

## 2014-12-25 MED ORDER — OXYCODONE-ACETAMINOPHEN 10-325 MG PO TABS: ORAL_TABLET | ORAL | Status: DC

## 2015-01-01 ENCOUNTER — Other Ambulatory Visit: Payer: Self-pay | Admitting: Critical Care Medicine

## 2015-01-01 ENCOUNTER — Telehealth: Payer: Self-pay | Admitting: Critical Care Medicine

## 2015-01-01 MED ORDER — FLUTICASONE FUROATE-VILANTEROL 100-25 MCG/INH IN AEPB: INHALATION_SPRAY | RESPIRATORY_TRACT | Status: DC

## 2015-01-01 NOTE — Telephone Encounter (Signed)
Per refill request 2/19: Jonelle Sports, RN at 01/01/2015 8:57 AM     Status: Signed       Expand All Collapse All   Received refill request for Breo. Pt last OV with Dr. Joya Gaskins: 12/23/13; rec to f/u in 2 months.  No pending appts. Rx sent to pharm - [email or web address] to schedule yearly OV with Dr. Joya Gaskins      -- Called pt and appt scheduled to see PW. Refill sent in. Nothing further needed

## 2015-01-01 NOTE — Telephone Encounter (Signed)
Received refill request for Breo. Pt last OV with Dr. Joya Gaskins: 12/23/13; rec to f/u in 2 months.  No pending appts. Rx sent to pharm - [email or web address] to schedule yearly OV with Dr. Joya Gaskins.

## 2015-01-01 NOTE — Telephone Encounter (Signed)
See phone msg from 01/01/15 - pt returned call.

## 2015-01-19 ENCOUNTER — Other Ambulatory Visit: Payer: Self-pay | Admitting: *Deleted

## 2015-01-19 DIAGNOSIS — G629 Polyneuropathy, unspecified: Secondary | ICD-10-CM

## 2015-01-19 DIAGNOSIS — C180 Malignant neoplasm of cecum: Secondary | ICD-10-CM

## 2015-01-19 MED ORDER — OXYCODONE-ACETAMINOPHEN 10-325 MG PO TABS: ORAL_TABLET | ORAL | Status: DC

## 2015-01-23 ENCOUNTER — Telehealth: Payer: Self-pay | Admitting: Hematology & Oncology

## 2015-01-23 ENCOUNTER — Ambulatory Visit (HOSPITAL_BASED_OUTPATIENT_CLINIC_OR_DEPARTMENT_OTHER): Payer: 59

## 2015-01-23 VITALS — BP 114/74 | HR 84 | Temp 98.4°F | Resp 14

## 2015-01-23 DIAGNOSIS — C189 Malignant neoplasm of colon, unspecified: Secondary | ICD-10-CM

## 2015-01-23 DIAGNOSIS — C180 Malignant neoplasm of cecum: Secondary | ICD-10-CM | POA: Diagnosis not present

## 2015-01-23 DIAGNOSIS — Z452 Encounter for adjustment and management of vascular access device: Secondary | ICD-10-CM | POA: Diagnosis not present

## 2015-01-23 MED ORDER — SODIUM CHLORIDE 0.9 % IJ SOLN
10.0000 mL | INTRAMUSCULAR | Status: DC | PRN
  Administered 2015-01-23: 10 mL via INTRAVENOUS
  Filled 2015-01-23: qty 10

## 2015-01-23 MED ORDER — HEPARIN SOD (PORK) LOCK FLUSH 100 UNIT/ML IV SOLN
500.0000 [IU] | Freq: Once | INTRAVENOUS | Status: AC
  Administered 2015-01-23: 500 [IU] via INTRAVENOUS
  Filled 2015-01-23: qty 5

## 2015-01-23 NOTE — Telephone Encounter (Signed)
Per MD ok for pt to get port flush a few days after 8 weeks.

## 2015-01-23 NOTE — Patient Instructions (Signed)

## 2015-01-29 ENCOUNTER — Other Ambulatory Visit: Payer: Self-pay | Admitting: Critical Care Medicine

## 2015-01-29 ENCOUNTER — Telehealth: Payer: Self-pay | Admitting: Family

## 2015-01-29 NOTE — Telephone Encounter (Signed)
CPE letter sent °

## 2015-01-29 NOTE — Telephone Encounter (Signed)
1 inhaler sent with no additonal and note to keep pending appt on 02/09/15.

## 2015-02-09 ENCOUNTER — Ambulatory Visit: Payer: 59 | Admitting: Critical Care Medicine

## 2015-02-16 ENCOUNTER — Encounter: Payer: 59 | Admitting: Family

## 2015-02-17 ENCOUNTER — Telehealth: Payer: Self-pay | Admitting: Family

## 2015-02-17 NOTE — Telephone Encounter (Signed)
Pre Visit letter sent  °

## 2015-02-23 ENCOUNTER — Other Ambulatory Visit: Payer: Self-pay | Admitting: *Deleted

## 2015-02-23 DIAGNOSIS — C180 Malignant neoplasm of cecum: Secondary | ICD-10-CM

## 2015-02-23 DIAGNOSIS — G629 Polyneuropathy, unspecified: Secondary | ICD-10-CM

## 2015-02-23 MED ORDER — OXYCODONE-ACETAMINOPHEN 10-325 MG PO TABS: ORAL_TABLET | ORAL | Status: DC

## 2015-02-25 ENCOUNTER — Other Ambulatory Visit: Payer: Self-pay | Admitting: Family

## 2015-02-25 ENCOUNTER — Ambulatory Visit: Payer: 59 | Admitting: Critical Care Medicine

## 2015-03-06 ENCOUNTER — Telehealth: Payer: Self-pay

## 2015-03-06 NOTE — Telephone Encounter (Signed)
Pre visit call completed 

## 2015-03-06 NOTE — Telephone Encounter (Signed)
Patient returned phone call. Best # [phone]

## 2015-03-06 NOTE — Telephone Encounter (Signed)
[id number] 

## 2015-03-07 ENCOUNTER — Other Ambulatory Visit: Payer: Self-pay | Admitting: Hematology & Oncology

## 2015-03-09 ENCOUNTER — Encounter: Payer: Self-pay | Admitting: Family

## 2015-03-09 ENCOUNTER — Ambulatory Visit (INDEPENDENT_AMBULATORY_CARE_PROVIDER_SITE_OTHER): Payer: 59 | Admitting: Family

## 2015-03-09 VITALS — BP 118/75 | HR 71 | Temp 98.2°F | Resp 16 | Ht 67.0 in | Wt 157.2 lb

## 2015-03-09 DIAGNOSIS — N6459 Other signs and symptoms in breast: Secondary | ICD-10-CM | POA: Diagnosis not present

## 2015-03-09 DIAGNOSIS — Z0000 Encounter for general adult medical examination without abnormal findings: Secondary | ICD-10-CM | POA: Diagnosis not present

## 2015-03-09 LAB — HEPATIC FUNCTION PANEL
ALK PHOS: 69 U/L (ref 39–117)
ALT: 11 U/L (ref 0–35)
AST: 14 U/L (ref 0–37)
Albumin: 4.2 g/dL (ref 3.5–5.2)
BILIRUBIN DIRECT: 0.1 mg/dL (ref 0.0–0.3)
Total Bilirubin: 0.4 mg/dL (ref 0.2–1.2)
Total Protein: 6.9 g/dL (ref 6.0–8.3)

## 2015-03-09 LAB — BASIC METABOLIC PANEL
BUN: 16 mg/dL (ref 6–23)
CHLORIDE: 106 meq/L (ref 96–112)
CO2: 25 mEq/L (ref 19–32)
Calcium: 8.8 mg/dL (ref 8.4–10.5)
Creatinine, Ser: 0.86 mg/dL (ref 0.40–1.20)
GFR: 71.54 mL/min (ref >60.00)
Glucose, Bld: 86 mg/dL (ref 70–99)
Potassium: 3.5 mEq/L (ref 3.5–5.1)
Sodium: 138 mEq/L (ref 135–145)

## 2015-03-09 LAB — LIPID PANEL
Cholesterol: 178 mg/dL (ref 0–200)
HDL: 39.6 mg/dL (ref >39.00)
LDL Cholesterol: 101 mg/dL — ABNORMAL HIGH (ref 0–99)
NONHDL: 138.4
TRIGLYCERIDES: 186 mg/dL — AB (ref 0.0–149.0)
Total CHOL/HDL Ratio: 4
VLDL: 37.2 mg/dL (ref 0.0–40.0)

## 2015-03-09 LAB — CBC WITH DIFFERENTIAL/PLATELET
Basophils Absolute: 0 10*3/uL (ref 0.0–0.1)
Basophils Relative: 0.3 % (ref 0.0–3.0)
Eosinophils Absolute: 0.1 10*3/uL (ref 0.0–0.7)
Eosinophils Relative: 1 % (ref 0.0–5.0)
HCT: 40.5 % (ref 36.0–46.0)
Hemoglobin: 12.9 g/dL (ref 12.0–15.0)
LYMPHS ABS: 1.7 10*3/uL (ref 0.7–4.0)
Lymphocytes Relative: 24.8 % (ref 12.0–46.0)
MCHC: 31.9 g/dL (ref 30.0–36.0)
MCV: 71.2 fl — ABNORMAL LOW (ref 78.0–100.0)
MONO ABS: 0.3 10*3/uL (ref 0.1–1.0)
Monocytes Relative: 4.5 % (ref 3.0–12.0)
NEUTROS ABS: 4.7 10*3/uL (ref 1.4–7.7)
Neutrophils Relative %: 69.4 % (ref 43.0–77.0)
Platelets: 158 10*3/uL (ref 150.0–400.0)
RBC: 5.7 Mil/uL — AB (ref 3.87–5.11)
RDW: 15.6 % — ABNORMAL HIGH (ref 11.5–15.5)
WBC: 6.8 10*3/uL (ref 4.0–10.5)

## 2015-03-09 LAB — TSH: TSH: 1.23 u[IU]/mL (ref 0.35–4.50)

## 2015-03-09 NOTE — Progress Notes (Signed)
Pre visit review using our clinic review tool, if applicable. No additional management support is needed unless otherwise documented below in the visit note. 

## 2015-03-09 NOTE — Patient Instructions (Addendum)
You will be contacted about your mammogram and your bone density test.  Let me know if you have not heard back about these tests in 1 week.  Please work on adding 30 minutes of walking or biking 5 days a week.  Schedule an eye exam.  Follow up in 6 weeks.

## 2015-03-09 NOTE — Progress Notes (Signed)
Subjective:    Patient ID: Teresa Morgan, female    DOB: Dec 30, 1955, 59 y.o.   MRN: [id number]  HPI  Teresa Morgan is a 59 yr old female who presents today for cpx.  Immunizations: up to date Diet: reports diet is overall healthy Exercise: no formal exercise Colonoscopy: 2015- normal Dexa: due Pap Smear: 2014- due 4/17 Mammogram: 2014- due  Vision: 3 yrs ago Dental:  Up to date  She is seeing Dr. Candis Schatz for psychiatry.   Notes that right nipple has become inverted- this is new for her.   Review of Systems  Constitutional: Negative for unexpected weight change.  HENT: Negative for hearing loss and rhinorrhea.   Eyes: Negative for visual disturbance.  Respiratory: Negative for cough.   Cardiovascular: Negative for leg swelling.  Gastrointestinal: Negative for nausea and diarrhea.       Occasional constipation- uses philips prn   Genitourinary: Negative for dysuria and frequency.  Musculoskeletal: Negative for myalgias and arthralgias.  Skin: Negative for rash.  Neurological: Negative for headaches.  Hematological: Negative for adenopathy.  Psychiatric/Behavioral: Negative for dysphoric mood and agitation.   Past Medical History  Diagnosis Date  . Anemia   . Anxiety   . Arthritis   . Depression   . GERD (gastroesophageal reflux disease)   . Peptic ulcer   . Colon cancer   . COPD with emphysema 06/18/2012    History   Social History  . Marital Status: Married    Spouse Name: N/A  . Number of Children: 3  . 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 Smoking status: Former Smoker -- 1.00 packs/day for 20 years    Types: Cigarettes    Start date: 04/23/1986    Quit date: 11/23/2005  . Smokeless tobacco: Never Used     Comment: quit 8 years ago  . Alcohol Use: 0.0 oz/week     Comment: social  . Drug Use: No  . Sexual Activity: Not on file   Other Topics Concern  . 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 Heel spurs Right   . Hemrrhoids    . Plantar fascia surgery Right 2009  . Colon resection  2012    for colon cancer  . Tubal ligation  07/1980  . Appendectomy  07/07/11  . Portacath      Family History  Problem Relation Age of Onset  . Diabetes Mother   . Colon cancer Cousin     Allergies  Allergen Reactions  . Aspirin     Upset stomach  . Cymbalta [Duloxetine Hcl] Swelling    Swollen lips   . Prozac [Fluoxetine Hcl] Swelling    Lip swelling  . Wellbutrin [Bupropion] Swelling    Lip swelling    Current Outpatient Prescriptions on File Prior to Visit  Medication Sig Dispense Refill  . ALPRAZolam (XANAX) 1 MG tablet Take 1 tablet (1 mg total) by mouth at bedtime as needed. 30 tablet 4  . BREO ELLIPTA 100-25 MCG/INH AEPB INHALE 1 PUFF BY MOUTH EVERY DAY 60 each 0  . BRINTELLIX 10 MG TABS Take 1 tablet by mouth daily.  2  . Calcium-Vitamin D 600-125 MG-UNIT TABS  Take by mouth daily.      Marland Kitchen gabapentin (NEURONTIN) 600 MG tablet TAKE 1 TABLET BY MOUTH 4 TIMES A DAY 120 tablet 2  . LORazepam (ATIVAN) 0.5 MG tablet Take 1 tablet (0.5 mg total) by mouth 2 (two) times daily as needed for anxiety. 30 tablet 0  . Multiple Vitamin (MULTIVITAMIN) tablet Take 1 tablet by mouth daily.      Marland Kitchen oxyCODONE-acetaminophen (PERCOCET) 10-325 MG per tablet 1 - 2 tabs every 4 hours as needed for pain 120 tablet 0  . pantoprazole (PROTONIX) 40 MG tablet TAKE 1 TABLET BY MOUTH EVERY DAY 30 tablet 5  . polyethylene glycol powder (GLYCOLAX/MIRALAX) powder Take 17 g by mouth 2 (two) times daily as needed. 3350 g 1  . Pyridoxine HCl (VITAMIN B-6) 250 MG tablet Take 1 tablet (250 mg total) by mouth daily. 30 tablet 12  . TURMERIC PO Take by mouth daily.     . vitamin E 200 UNIT capsule Take 200 Units by mouth daily.      . zaleplon  (SONATA) 10 MG capsule Take 10 mg by mouth at bedtime as needed.     No current facility-administered medications on file prior to visit.    BP 118/75 mmHg  Pulse 71  Temp(Src) 98.2 F (36.8 C) (Oral)  Resp 16  Ht 5\' 7"  (1.702 m)  Wt 157 lb 3.2 oz (71.305 kg)  BMI 24.62 kg/m2  SpO2 99%       Objective:   Physical Exam  Physical Exam  Constitutional: She is oriented to person, place, and time. She appears well-developed and well-nourished. No distress.  HENT:  Head: Normocephalic and atraumatic.  Right Ear: Tympanic membrane and ear canal normal.  Left Ear: Tympanic membrane and ear canal normal.  Mouth/Throat: Oropharynx is clear and moist.  Eyes: Pupils are equal, round, and reactive to light. No scleral icterus.  Neck: Normal range of motion. No thyromegaly present.  Cardiovascular: Normal rate and regular rhythm.   No murmur heard. Pulmonary/Chest: Effort normal and breath sounds normal. No respiratory distress. He has no wheezes. She has no rales. She exhibits no tenderness.  Abdominal: Soft. Bowel sounds are normal. He exhibits no distension and no mass. There is no tenderness. There is no rebound and no guarding.  Musculoskeletal: She exhibits no edema.  Lymphadenopathy:    She has no cervical adenopathy.  Neurological: She is alert and oriented to person, place, and time. She has normal patellarreflexes. She exhibits normal muscle tone. Coordination normal.  Skin: Skin is warm and dry.  Psychiatric: She has a normal mood and affect. Her behavior is normal. Judgment and thought content normal.  Breasts: Examined lying Right: Without masses,  discharge or axillary adenopathy. R nipple retraction  Left: Without masses, retractions, discharge or axillary adenopathy.           Assessment & Plan:         Assessment & Plan:

## 2015-03-10 LAB — URINALYSIS, ROUTINE W REFLEX MICROSCOPIC
BILIRUBIN URINE: NEGATIVE
Hgb urine dipstick: NEGATIVE
Ketones, ur: NEGATIVE
LEUKOCYTES UA: NEGATIVE
NITRITE: NEGATIVE
RBC / HPF: NONE SEEN (ref 0–?)
SPECIFIC GRAVITY, URINE: 1.025 (ref 1.000–1.030)
Total Protein, Urine: NEGATIVE
URINE GLUCOSE: NEGATIVE
Urobilinogen, UA: 0.2 (ref 0.0–1.0)
WBC UA: NONE SEEN (ref 0–?)
pH: 6 (ref 5.0–8.0)

## 2015-03-10 LAB — HIV ANTIBODY (ROUTINE TESTING W REFLEX): HIV: NONREACTIVE

## 2015-03-11 ENCOUNTER — Other Ambulatory Visit (INDEPENDENT_AMBULATORY_CARE_PROVIDER_SITE_OTHER): Payer: 59

## 2015-03-11 ENCOUNTER — Encounter: Payer: Self-pay | Admitting: Family

## 2015-03-11 DIAGNOSIS — R718 Other abnormality of red blood cells: Secondary | ICD-10-CM

## 2015-03-11 LAB — IRON: Iron: 114 ug/dL (ref 42–145)

## 2015-03-12 ENCOUNTER — Encounter: Payer: Self-pay | Admitting: Family

## 2015-03-12 DIAGNOSIS — N6459 Other signs and symptoms in breast: Secondary | ICD-10-CM | POA: Insufficient documentation

## 2015-03-12 NOTE — Assessment & Plan Note (Signed)
Will refer for diagnostic mammo

## 2015-03-12 NOTE — Assessment & Plan Note (Signed)
Immunizations reviewed and up to date.  Continue healthy diet, exercise.  Obtain routine labs.  Refer for dexa, advised pt to schedule eye exam.

## 2015-03-23 ENCOUNTER — Other Ambulatory Visit (HOSPITAL_BASED_OUTPATIENT_CLINIC_OR_DEPARTMENT_OTHER): Payer: 59

## 2015-03-23 ENCOUNTER — Ambulatory Visit (HOSPITAL_BASED_OUTPATIENT_CLINIC_OR_DEPARTMENT_OTHER): Payer: 59

## 2015-03-23 ENCOUNTER — Ambulatory Visit (HOSPITAL_BASED_OUTPATIENT_CLINIC_OR_DEPARTMENT_OTHER): Payer: 59 | Admitting: Hematology & Oncology

## 2015-03-23 VITALS — BP 121/57 | HR 86 | Temp 97.4°F | Wt 156.0 lb

## 2015-03-23 DIAGNOSIS — C180 Malignant neoplasm of cecum: Secondary | ICD-10-CM

## 2015-03-23 DIAGNOSIS — G620 Drug-induced polyneuropathy: Secondary | ICD-10-CM | POA: Diagnosis not present

## 2015-03-23 DIAGNOSIS — C779 Secondary and unspecified malignant neoplasm of lymph node, unspecified: Secondary | ICD-10-CM | POA: Diagnosis not present

## 2015-03-23 DIAGNOSIS — G629 Polyneuropathy, unspecified: Secondary | ICD-10-CM

## 2015-03-23 LAB — COMPREHENSIVE METABOLIC PANEL
ALBUMIN: 4 g/dL (ref 3.5–5.2)
ALT: 11 U/L (ref 0–35)
AST: 15 U/L (ref 0–37)
Alkaline Phosphatase: 66 U/L (ref 39–117)
BILIRUBIN TOTAL: 0.4 mg/dL (ref 0.2–1.2)
BUN: 12 mg/dL (ref 6–23)
CALCIUM: 8.8 mg/dL (ref 8.4–10.5)
CO2: 26 meq/L (ref 19–32)
CREATININE: 0.86 mg/dL (ref 0.50–1.10)
Chloride: 105 mEq/L (ref 96–112)
Glucose, Bld: 84 mg/dL (ref 70–99)
Potassium: 3.8 mEq/L (ref 3.5–5.3)
Sodium: 138 mEq/L (ref 135–145)
Total Protein: 6.5 g/dL (ref 6.0–8.3)

## 2015-03-23 LAB — CBC WITH DIFFERENTIAL (CANCER CENTER ONLY)
BASO#: 0 10*3/uL (ref 0.0–0.2)
BASO%: 0.2 % (ref 0.0–2.0)
EOS%: 2.1 % (ref 0.0–7.0)
Eosinophils Absolute: 0.1 10*3/uL (ref 0.0–0.5)
HCT: 39.6 % (ref 34.8–46.6)
HGB: 13.1 g/dL (ref 11.6–15.9)
LYMPH#: 1.8 10*3/uL (ref 0.9–3.3)
LYMPH%: 28.4 % (ref 14.0–48.0)
MCH: 23.1 pg — AB (ref 26.0–34.0)
MCHC: 33.1 g/dL (ref 32.0–36.0)
MCV: 70 fL — ABNORMAL LOW (ref 81–101)
MONO#: 0.4 10*3/uL (ref 0.1–0.9)
MONO%: 5.8 % (ref 0.0–13.0)
NEUT%: 63.5 % (ref 39.6–80.0)
NEUTROS ABS: 4 10*3/uL (ref 1.5–6.5)
Platelets: 161 10*3/uL (ref 145–400)
RBC: 5.68 10*6/uL — AB (ref 3.70–5.32)
RDW: 16.1 % — ABNORMAL HIGH (ref 11.1–15.7)
WBC: 6.2 10*3/uL (ref 3.9–10.0)

## 2015-03-23 LAB — CEA: CEA: 2.7 ng/mL (ref 0.0–5.0)

## 2015-03-23 MED ORDER — OXYCODONE-ACETAMINOPHEN 10-325 MG PO TABS: ORAL_TABLET | ORAL | Status: DC

## 2015-03-23 MED ORDER — SODIUM CHLORIDE 0.9 % IJ SOLN
10.0000 mL | INTRAMUSCULAR | Status: DC | PRN
  Administered 2015-03-23: 10 mL via INTRAVENOUS
  Filled 2015-03-23: qty 10

## 2015-03-23 MED ORDER — HEPARIN SOD (PORK) LOCK FLUSH 100 UNIT/ML IV SOLN
500.0000 [IU] | Freq: Once | INTRAVENOUS | Status: AC
  Administered 2015-03-23: 500 [IU] via INTRAVENOUS
  Filled 2015-03-23: qty 5

## 2015-03-23 NOTE — Patient Instructions (Signed)

## 2015-03-23 NOTE — Progress Notes (Signed)
Hematology and Oncology Follow Up Visit  Teresa Morgan [id number] 11/17/1955 59 y.o. 03/23/2015   Principle Diagnosis:  1. Stage IIIB (T2 N2 M0) adenocarcinoma of the cecum. 2. Neuropathy secondary to chemotherapy.  Current Therapy:    Observation     Interim History:  Teresa Morgan is back for followup. She is doing pretty well. 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 This is easier on her with the neuropathy that she has.  The neuropathy seems to be holding steady. It does not seem to be worsening. She has occasional cramps in her calf muscles.  She's had no cough. She is not smoking. She has had no weight loss. She still has some occasional pain over on her right flank. There is no dysuria.  She's had no change in bowel habits. There is no bleeding. She's had no constipation.  She's had no nausea or vomiting. She's had a good appetite. She's had no rashes.   Her performance status is ECOG 0  Medications:  Current outpatient prescriptions:  .  ALPRAZolam (XANAX) 1 MG tablet, Take 1 tablet (1 mg total) by mouth at bedtime as needed., Disp: 30 tablet, Rfl: 4 .  BREO ELLIPTA 100-25 MCG/INH AEPB, INHALE 1 PUFF BY MOUTH EVERY DAY, Disp: 60 each, Rfl: 0 .  BRINTELLIX 10 MG TABS, Take 1 tablet by mouth daily., Disp: , Rfl: 2 .  Calcium-Vitamin D 600-125 MG-UNIT TABS, Take by mouth daily.  , Disp: , Rfl:  .  gabapentin (NEURONTIN) 600 MG tablet, TAKE 1 TABLET BY MOUTH 4 TIMES A DAY, Disp: 120 tablet, Rfl: 2 .  LORazepam (ATIVAN) 0.5 MG tablet, Take 1 tablet (0.5 mg total) by mouth 2 (two) times daily as needed for anxiety., Disp: 30 tablet, Rfl: 0 .  Multiple Vitamin (MULTIVITAMIN) tablet, Take 1 tablet by mouth daily.  , Disp: , Rfl:  .  oxyCODONE-acetaminophen (PERCOCET) 10-325 MG per tablet, 1 - 2 tabs every 4 hours as needed for pain, Disp: 120 tablet, Rfl: 0 .  pantoprazole (PROTONIX) 40 MG tablet, TAKE 1 TABLET BY MOUTH EVERY DAY, Disp: 30 tablet, Rfl: 5 .   polyethylene glycol powder (GLYCOLAX/MIRALAX) powder, Take 17 g by mouth 2 (two) times daily as needed., Disp: 3350 g, Rfl: 1 .  Pyridoxine HCl (VITAMIN B-6) 250 MG tablet, Take 1 tablet (250 mg total) by mouth daily., Disp: 30 tablet, Rfl: 12 .  TURMERIC PO, Take by mouth daily. , Disp: , Rfl:  .  vitamin E 200 UNIT capsule, Take 200 Units by mouth daily.  , Disp: , Rfl:  .  zaleplon (SONATA) 10 MG capsule, Take 10 mg by mouth at bedtime as needed., Disp: , Rfl:  No current facility-administered medications for this visit.  Facility-Administered Medications Ordered in Other Visits:  .  sodium chloride 0.9 % injection 10 mL, 10 mL, Intravenous, PRN, Volanda Napoleon, MD, 10 mL at 03/23/15 1059  Allergies:  Allergies  Allergen Reactions  . Aspirin     Upset stomach  . Cymbalta [Duloxetine Hcl] Swelling    Swollen lips   . Prozac [Fluoxetine Hcl] Swelling    Lip swelling  . Wellbutrin [Bupropion] Swelling    Lip swelling    Past Medical History, Surgical history, Social history, and Family History were reviewed and updated.  Review of Systems: As above  Physical Exam:  weight is 156 lb (70.761 kg). Her oral temperature is 97.4 F (36.3 C). Her blood  pressure is 121/57 and her pulse is 86.   Head and neck exam shows no ocular or oral lesions. She has no adenopathy in the neck. There is no scleral icterus. Lungs are clear. There are no rales, wheezes or rhonchi are noted. Cardiac exam regular rate and rhythm with no murmurs rubs or bruits. Abdomen is soft. She has good bowel sounds. There is no fluid wave. There is no palpable liver or spleen tip. She has well-healed laparoscopy scar. Back exam shows no tenderness over the spine, ribs or hips. Extremities shows no clubbing cyanosis or edema. Neurological exam shows no focal neurological deficits. Skin exam no rashes, ecchymoses or petechia.  Lab Results  Component Value Date   WBC 6.2 03/23/2015   HGB 13.1 03/23/2015   HCT 39.6  03/23/2015   MCV 70* 03/23/2015   PLT 161 03/23/2015     Chemistry      Component Value Date/Time   NA 138 03/09/2015 1400   NA 141 09/22/2014 1146   K 3.5 03/09/2015 1400   K 3.3 09/22/2014 1146   CL 106 03/09/2015 1400   CL 104 09/22/2014 1146   CO2 25 03/09/2015 1400   CO2 28 09/22/2014 1146   BUN 16 03/09/2015 1400   BUN 11 09/22/2014 1146   CREATININE 0.86 03/09/2015 1400   CREATININE 0.8 09/22/2014 1146      Component Value Date/Time   CALCIUM 8.8 03/09/2015 1400   CALCIUM 8.8 09/22/2014 1146   ALKPHOS 69 03/09/2015 1400   ALKPHOS 70 09/22/2014 1146   AST 14 03/09/2015 1400   AST 18 09/22/2014 1146   ALT 11 03/09/2015 1400   ALT 13 09/22/2014 1146   BILITOT 0.4 03/09/2015 1400   BILITOT 0.60 09/22/2014 1146         Impression and Plan: Teresa Morgan is 59 year old female with stage IIIB colon cancer. This was of the cecum. She underwent resection back in I think September of 2012. She had 4 positive lymph nodes. She completed chemotherapy in March of 2013.  She still has a risk of recurrence. We still have to be cautious and watch.  I do not think that we have to do any scans on her. The CT of the chest has been pretty stable. She is not smoking. She is asymptomatic.  I want to see her back in 6 months.   Volanda Napoleon, MD 5/9/201611:41 AM

## 2015-03-25 ENCOUNTER — Encounter: Payer: Self-pay | Admitting: Family

## 2015-04-17 ENCOUNTER — Other Ambulatory Visit: Payer: Self-pay | Admitting: Hematology & Oncology

## 2015-04-17 DIAGNOSIS — F329 Major depressive disorder, single episode, unspecified: Secondary | ICD-10-CM

## 2015-04-17 DIAGNOSIS — F32A Depression, unspecified: Secondary | ICD-10-CM

## 2015-04-17 MED ORDER — VORTIOXETINE HBR 10 MG PO TABS: 1.0000 | ORAL_TABLET | Freq: Every day | ORAL | Status: DC

## 2015-04-20 ENCOUNTER — Ambulatory Visit: Payer: Self-pay | Admitting: Family

## 2015-04-20 DIAGNOSIS — Z0289 Encounter for other administrative examinations: Secondary | ICD-10-CM

## 2015-04-22 ENCOUNTER — Encounter: Payer: Self-pay | Admitting: Family

## 2015-04-22 ENCOUNTER — Telehealth: Payer: Self-pay | Admitting: Family

## 2015-04-22 NOTE — Telephone Encounter (Signed)
Pt was no show for appointment on 04/20/15- LM on VM stating there was a family emergency. Letter sent- charge patient?

## 2015-04-22 NOTE — Telephone Encounter (Signed)
No

## 2015-04-27 ENCOUNTER — Other Ambulatory Visit: Payer: Self-pay | Admitting: *Deleted

## 2015-04-27 DIAGNOSIS — C180 Malignant neoplasm of cecum: Secondary | ICD-10-CM

## 2015-04-27 DIAGNOSIS — G629 Polyneuropathy, unspecified: Secondary | ICD-10-CM

## 2015-04-27 MED ORDER — OXYCODONE-ACETAMINOPHEN 10-325 MG PO TABS: ORAL_TABLET | ORAL | Status: DC

## 2015-05-04 ENCOUNTER — Ambulatory Visit (HOSPITAL_BASED_OUTPATIENT_CLINIC_OR_DEPARTMENT_OTHER): Payer: 59

## 2015-05-04 DIAGNOSIS — C180 Malignant neoplasm of cecum: Secondary | ICD-10-CM | POA: Diagnosis not present

## 2015-05-04 DIAGNOSIS — D57419 Sickle-cell thalassemia with crisis, unspecified: Secondary | ICD-10-CM

## 2015-05-04 DIAGNOSIS — Z452 Encounter for adjustment and management of vascular access device: Secondary | ICD-10-CM | POA: Diagnosis not present

## 2015-05-04 MED ORDER — HEPARIN SOD (PORK) LOCK FLUSH 100 UNIT/ML IV SOLN
500.0000 [IU] | Freq: Once | INTRAVENOUS | Status: AC
  Administered 2015-05-04: 500 [IU] via INTRAVENOUS
  Filled 2015-05-04: qty 5

## 2015-05-04 MED ORDER — SODIUM CHLORIDE 0.9 % IJ SOLN
10.0000 mL | INTRAMUSCULAR | Status: DC | PRN
  Administered 2015-05-04: 10 mL via INTRAVENOUS
  Filled 2015-05-04: qty 10

## 2015-05-04 NOTE — Patient Instructions (Signed)

## 2015-05-22 ENCOUNTER — Other Ambulatory Visit: Payer: Self-pay | Admitting: Hematology & Oncology

## 2015-05-22 ENCOUNTER — Other Ambulatory Visit: Payer: Self-pay | Admitting: Critical Care Medicine

## 2015-05-22 ENCOUNTER — Other Ambulatory Visit: Payer: Self-pay | Admitting: Nurse Practitioner

## 2015-05-22 DIAGNOSIS — C180 Malignant neoplasm of cecum: Secondary | ICD-10-CM

## 2015-05-22 DIAGNOSIS — G629 Polyneuropathy, unspecified: Secondary | ICD-10-CM

## 2015-05-22 DIAGNOSIS — G4700 Insomnia, unspecified: Secondary | ICD-10-CM

## 2015-05-22 MED ORDER — OXYCODONE-ACETAMINOPHEN 10-325 MG PO TABS: ORAL_TABLET | ORAL | Status: DC

## 2015-05-22 MED ORDER — ALPRAZOLAM 1 MG PO TABS: 1.0000 mg | ORAL_TABLET | Freq: Every evening | ORAL | Status: DC | PRN

## 2015-05-28 ENCOUNTER — Telehealth: Payer: Self-pay | Admitting: Critical Care Medicine

## 2015-05-28 NOTE — Telephone Encounter (Signed)
Pt informed she would need appt due to not being seen since 12/2013. appt was scheduled. Nothing further needed.

## 2015-05-28 NOTE — Telephone Encounter (Signed)
lmtcb x1 for pt Pt has not been seen since 12/2013. Will need appointment for samples.

## 2015-06-11 ENCOUNTER — Telehealth: Payer: Self-pay | Admitting: Critical Care Medicine

## 2015-06-11 NOTE — Telephone Encounter (Signed)
lmtcb X1 for pt to make aware of med change.  Will send med after speaking to pt.

## 2015-06-11 NOTE — Telephone Encounter (Signed)
Ok to use advair 250 one puff bid

## 2015-06-11 NOTE — Telephone Encounter (Signed)
Pt returned call [phone]

## 2015-06-11 NOTE — Telephone Encounter (Signed)
Received letter from [employer].  Memory Dance is nonpreferred with pt's insurance.  Preferred is Advair Diskus and Advair HFA.  I do not see Advair on pt's past med list.  Dr. Joya Gaskins, please advise if Memory Dance can be changed to Advair.  Thank you.  Note:  Pt's last OV with PW 12/23/2013.  She has a pending OV on 07/08/15.    If PA is needed, phone # is [phone].

## 2015-06-11 NOTE — Telephone Encounter (Signed)
lmomtcb x1 

## 2015-06-15 ENCOUNTER — Ambulatory Visit (HOSPITAL_BASED_OUTPATIENT_CLINIC_OR_DEPARTMENT_OTHER): Payer: 59

## 2015-06-15 VITALS — BP 134/80 | HR 82 | Temp 98.2°F | Resp 18

## 2015-06-15 DIAGNOSIS — C180 Malignant neoplasm of cecum: Secondary | ICD-10-CM

## 2015-06-15 DIAGNOSIS — Z452 Encounter for adjustment and management of vascular access device: Secondary | ICD-10-CM | POA: Diagnosis not present

## 2015-06-15 MED ORDER — SODIUM CHLORIDE 0.9 % IJ SOLN
10.0000 mL | INTRAMUSCULAR | Status: DC | PRN
  Administered 2015-06-15: 10 mL via INTRAVENOUS
  Filled 2015-06-15: qty 10

## 2015-06-15 MED ORDER — HEPARIN SOD (PORK) LOCK FLUSH 100 UNIT/ML IV SOLN
500.0000 [IU] | Freq: Once | INTRAVENOUS | Status: AC
  Administered 2015-06-15: 500 [IU] via INTRAVENOUS
  Filled 2015-06-15: qty 5

## 2015-06-15 NOTE — Telephone Encounter (Signed)
lmomtcb for pt 

## 2015-06-15 NOTE — Patient Instructions (Signed)

## 2015-06-16 NOTE — Telephone Encounter (Signed)
lmomtcb for pt on home and cell #s 

## 2015-06-16 NOTE — Telephone Encounter (Signed)
Spoke with patient- States that she now has new insurance and would like for [place] to run PA through them.  Member ID# [id number] Pharmacy # [phone] Provider # [phone], Breo 100 is covered with a $73.68 (30-day) local pharmacy copay and $216.47 (90-day) mail order copay Alternative Advair 243mcg is covered with a $82.75 (30-day) local pharmacy copay and $242.83 (90-day) mail order copay ---- Called pt back, made aware of coverage options with new insurance.  Pt states that she is going to contact the pharmacy bc what she was wanting was to know what the copay would be after both of her insurances paid out.  Pt advised to call us back and let us know what she finds out and if alternative is needed then we can check with PW. Will await call back

## 2015-06-18 NOTE — Telephone Encounter (Signed)
LMTCB for the pt 

## 2015-06-25 ENCOUNTER — Other Ambulatory Visit: Payer: Self-pay | Admitting: *Deleted

## 2015-06-25 DIAGNOSIS — G629 Polyneuropathy, unspecified: Secondary | ICD-10-CM

## 2015-06-25 DIAGNOSIS — C180 Malignant neoplasm of cecum: Secondary | ICD-10-CM

## 2015-06-25 DIAGNOSIS — G4700 Insomnia, unspecified: Secondary | ICD-10-CM

## 2015-06-25 MED ORDER — GABAPENTIN 600 MG PO TABS: 600.0000 mg | ORAL_TABLET | Freq: Four times a day (QID) | ORAL | Status: DC

## 2015-06-25 MED ORDER — OXYCODONE-ACETAMINOPHEN 10-325 MG PO TABS: ORAL_TABLET | ORAL | Status: DC

## 2015-06-25 NOTE — Telephone Encounter (Signed)
Called pt to check on status of below. Spoke with family member.  Was advised pt is at work. He will ask pt to return call to office.

## 2015-06-26 NOTE — Telephone Encounter (Signed)
lmomtcb for pt 

## 2015-07-01 NOTE — Telephone Encounter (Signed)
lmomtcb for pt on home and cell #s 

## 2015-07-02 ENCOUNTER — Other Ambulatory Visit: Payer: Self-pay | Admitting: Critical Care Medicine

## 2015-07-02 ENCOUNTER — Encounter: Payer: Self-pay | Admitting: Emergency Medicine

## 2015-07-02 NOTE — Telephone Encounter (Addendum)
lmtcb for pt. A letter has been placed in outgoing mail for pt to call back as there has been several unsuccessful attempts to reach pt.

## 2015-07-07 ENCOUNTER — Other Ambulatory Visit: Payer: Self-pay | Admitting: *Deleted

## 2015-07-07 ENCOUNTER — Telehealth: Payer: Self-pay | Admitting: *Deleted

## 2015-07-07 NOTE — Telephone Encounter (Signed)
Patient's daughter called and requested that the office call in a refill for her mother's ativan. Chart reviewed and patient has not been prescribed ativan since 10/2014. Dr Marin Olp does not want patient to get additional prescription. Patient is prescribed xanax from this office, and Dr Marin Olp states that if patient needs ativan in addition to the xanax, she must see her PCP regarding this need.

## 2015-07-08 ENCOUNTER — Ambulatory Visit: Payer: Self-pay | Admitting: Critical Care Medicine

## 2015-07-14 ENCOUNTER — Telehealth: Payer: Self-pay | Admitting: Critical Care Medicine

## 2015-07-14 MED ORDER — FLUTICASONE FUROATE-VILANTEROL 100-25 MCG/INH IN AEPB: INHALATION_SPRAY | RESPIRATORY_TRACT | Status: DC

## 2015-07-14 NOTE — Telephone Encounter (Signed)
Per phone note from 7.28.2016:  Virl Cagey, CMA at 06/16/2015 11:15 AM     Status: Signed       Expand All Collapse All   Spoke with patient- States that she now has new insurance and would like for [place] to run PA through them.  Member ID# [id number] Pharmacy # [phone] Provider # [phone], Breo 100 is covered with a $73.68 (30-day) local pharmacy copay and $216.47 (90-day) mail order copay Alternative Advair 251mcg is covered with a $82.75 (30-day) local pharmacy copay and $242.83 (90-day) mail order copay ---- Called pt back, made aware of coverage options with new insurance.  Pt states that she is going to contact the pharmacy bc what she was wanting was to know what the copay would be after both of her insurances paid out.  Pt advised to call us back and let us know what she finds out and if alternative is needed then we can check with PW. Will await call back         Received call from pt. Pt states she was unable to receive any information from the pharmacy because we have not sent in any prescriptions. I called [facility] and had them run a trial to see pt's co-pay for both Breo 100 and Advair 250. Both rx's have a copda of $0 with pt's tricare insurance. Called pt back and pt stated she is still using tricare insurance. Informed pt to take her insurance info up to pharmacy to be sure we are all with the correct info. Pt verbalized understanding and is aware we will call back with the intended rx per PW.   Dr. Joya Gaskins please advise which medication you would like pt on. Pt was initially taking Breo 100 (because of insurance issues) then changed to Advair but never started taking Advair d/t insurance issues.

## 2015-07-14 NOTE — Telephone Encounter (Signed)
i prefer her to use breo one puff daily

## 2015-07-14 NOTE — Telephone Encounter (Signed)
lmtcb for pt.  Rx sent to [facility] pharmacy.

## 2015-07-15 NOTE — Telephone Encounter (Signed)
lmtcb for pt.  

## 2015-07-16 NOTE — Telephone Encounter (Signed)
lmtcb X3 for pt. rx has been sent to pharmacy per Daneil Dan. Will close message per triage protocol.

## 2015-07-24 ENCOUNTER — Other Ambulatory Visit: Payer: Self-pay | Admitting: *Deleted

## 2015-07-24 DIAGNOSIS — G4700 Insomnia, unspecified: Secondary | ICD-10-CM

## 2015-07-24 DIAGNOSIS — G629 Polyneuropathy, unspecified: Secondary | ICD-10-CM

## 2015-07-24 DIAGNOSIS — C180 Malignant neoplasm of cecum: Secondary | ICD-10-CM

## 2015-07-24 MED ORDER — OXYCODONE-ACETAMINOPHEN 10-325 MG PO TABS: ORAL_TABLET | ORAL | Status: DC

## 2015-07-27 ENCOUNTER — Ambulatory Visit (HOSPITAL_BASED_OUTPATIENT_CLINIC_OR_DEPARTMENT_OTHER): Payer: 59

## 2015-07-27 VITALS — BP 132/75 | HR 76 | Temp 97.8°F | Resp 18

## 2015-07-27 DIAGNOSIS — C180 Malignant neoplasm of cecum: Secondary | ICD-10-CM | POA: Diagnosis not present

## 2015-07-27 DIAGNOSIS — Z452 Encounter for adjustment and management of vascular access device: Secondary | ICD-10-CM

## 2015-07-27 NOTE — Patient Instructions (Signed)
Implanted Port Insertion, Care After Refer to this sheet in the next few weeks. These instructions provide you with information on caring for yourself after your procedure. Your health care provider may also give you more specific instructions. Your treatment has been planned according to current medical practices, but problems sometimes occur. Call your health care provider if you have any problems or questions after your procedure. WHAT TO EXPECT AFTER THE PROCEDURE After your procedure, it is typical to have the following:   Discomfort at the port insertion site. Ice packs to the area will help.  Bruising on the skin over the port. This will subside in 3-4 days. HOME CARE INSTRUCTIONS  After your port is placed, you will get a manufacturer's information card. The card has information about your port. Keep this card with you at all times.   Know what kind of port you have. There are many types of ports available.   Wear a medical alert bracelet in case of an emergency. This can help alert health care workers that you have a port.   The port can stay in for as long as your health care provider believes it is necessary.   A home health care nurse may give medicines and take care of the port.   You or a family member can get special training and directions for giving medicine and taking care of the port at home.  SEEK MEDICAL CARE IF:   Your port does not flush or you are unable to get a blood return.   You have a fever or chills. SEEK IMMEDIATE MEDICAL CARE IF:  You have new fluid or pus coming from your incision.   You notice a bad smell coming from your incision site.   You have swelling, pain, or more redness at the incision or port site.   You have chest pain or shortness of breath. Document Released: 08/21/2013 Document Revised: 11/05/2013 Document Reviewed: 08/21/2013 ExitCare Patient Information 2015 [employer]. This information is not intended to replace  advice given to you by your health care provider. Make sure you discuss any questions you have with your health care provider.  

## 2015-07-28 MED ORDER — SODIUM CHLORIDE 0.9 % IJ SOLN
10.0000 mL | INTRAMUSCULAR | Status: DC | PRN
  Administered 2015-07-27: 10 mL via INTRAVENOUS
  Filled 2015-07-28: qty 10

## 2015-07-28 MED ORDER — HEPARIN SOD (PORK) LOCK FLUSH 100 UNIT/ML IV SOLN
500.0000 [IU] | Freq: Once | INTRAVENOUS | Status: AC
  Administered 2015-07-27: 500 [IU] via INTRAVENOUS
  Filled 2015-07-28: qty 5

## 2015-08-05 ENCOUNTER — Ambulatory Visit (INDEPENDENT_AMBULATORY_CARE_PROVIDER_SITE_OTHER): Payer: 59 | Admitting: Family

## 2015-08-05 ENCOUNTER — Encounter: Payer: Self-pay | Admitting: Family

## 2015-08-05 VITALS — BP 102/70 | HR 79 | Temp 98.0°F | Resp 16 | Ht 67.0 in | Wt 153.0 lb

## 2015-08-05 DIAGNOSIS — G4700 Insomnia, unspecified: Secondary | ICD-10-CM

## 2015-08-05 DIAGNOSIS — C180 Malignant neoplasm of cecum: Secondary | ICD-10-CM

## 2015-08-05 DIAGNOSIS — F32A Depression, unspecified: Secondary | ICD-10-CM

## 2015-08-05 DIAGNOSIS — F329 Major depressive disorder, single episode, unspecified: Secondary | ICD-10-CM | POA: Diagnosis not present

## 2015-08-05 DIAGNOSIS — G629 Polyneuropathy, unspecified: Secondary | ICD-10-CM | POA: Diagnosis not present

## 2015-08-05 DIAGNOSIS — Z0000 Encounter for general adult medical examination without abnormal findings: Secondary | ICD-10-CM

## 2015-08-05 DIAGNOSIS — F4323 Adjustment disorder with mixed anxiety and depressed mood: Secondary | ICD-10-CM

## 2015-08-05 LAB — FOLATE: Folate: >24.8 ng/mL (ref >5.9)

## 2015-08-05 LAB — VITAMIN B12: Vitamin B-12: 285 pg/mL (ref 211–911)

## 2015-08-05 MED ORDER — LORAZEPAM 0.5 MG PO TABS: ORAL_TABLET | ORAL | Status: DC

## 2015-08-05 MED ORDER — VORTIOXETINE HBR 5 MG PO TABS: 1.0000 | ORAL_TABLET | Freq: Every day | ORAL | Status: DC

## 2015-08-05 MED ORDER — ALPRAZOLAM 1 MG PO TABS: 1.0000 mg | ORAL_TABLET | Freq: Every evening | ORAL | Status: DC | PRN

## 2015-08-05 NOTE — Progress Notes (Signed)
Subjective:    Patient ID: Teresa Morgan, female    DOB: 1956-07-13, 60 y.o.   MRN: [id number]  HPI  Teresa Morgan is a 59 yr old female who presents today for follo wup.  1) neuropathy- she reports that legs are "jelly."    Had flu shot at [facility] on Thursday.  Woke up Friday- neuropathy symptoms worse.    Depression- reports ttha thtis is well controlled on her current medication. She was seeing Dr. Candis Schatz, but he left the practice.  She needs a new psychiatrist.    Anxiety- Uses lorazepam during the day as needed for anxiety(0.5mg ).  Uses xanax at bedtime for sleep.  (makes her too sleepy if she uses during the day.   Review of Systems See HPI  Past Medical History  Diagnosis Date  . Anemia   . Anxiety   . Arthritis   . Depression   . GERD (gastroesophageal reflux disease)   . Peptic ulcer   . Colon cancer   . COPD with emphysema 06/18/2012    Social History   Social History  . Marital Status: Married    Spouse Name: N/A  . Number of Children: 3  . 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 Smoking status: Former Smoker -- 1.00 packs/day for 20 years    Types: Cigarettes    Start date: 04/23/1986    Quit date: 11/23/2005  . Smokeless tobacco: Never Used     Comment: quit 8 years ago  . Alcohol Use: 0.0 oz/week     Comment: social  . Drug Use: No  . Sexual Activity: Not on file   Other Topics Concern  . 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 Heel spurs Right   . Hemrrhoids    . Plantar fascia surgery Right 2009  . Colon resection  2012    for colon cancer  . Tubal ligation  07/1980  . Appendectomy  07/07/11  . Portacath      Family History  Problem Relation Age of Onset  . Diabetes Mother   . Colon cancer Cousin      Allergies  Allergen Reactions  . Aspirin     Upset stomach  . Cymbalta [Duloxetine Hcl] Swelling    Swollen lips   . Prozac [Fluoxetine Hcl] Swelling    Lip swelling  . Wellbutrin [Bupropion] Swelling    Lip swelling    Current Outpatient Prescriptions on File Prior to Visit  Medication Sig Dispense Refill  . ALPRAZolam (XANAX) 1 MG tablet Take 1 tablet (1 mg total) by mouth at bedtime as needed. 30 tablet 4  . Calcium-Vitamin D 600-125 MG-UNIT TABS Take by mouth daily.      . Fluticasone Furoate-Vilanterol (BREO ELLIPTA) 100-25 MCG/INH AEPB INHALE 1 PUFF BY MOUTH EVERY DAY 60 each 3  . gabapentin (NEURONTIN) 600 MG tablet Take 1 tablet (600 mg total) by mouth 4 (four) times daily. 120 tablet 2  . Multiple Vitamin (MULTIVITAMIN) tablet Take 1 tablet by mouth daily.      Marland Kitchen oxyCODONE-acetaminophen (PERCOCET) 10-325 MG per tablet 1 - 2 tabs every 4 hours as needed for pain 120 tablet 0  . pantoprazole (  PROTONIX) 40 MG tablet TAKE 1 TABLET BY MOUTH EVERY DAY 30 tablet 5  . polyethylene glycol powder (GLYCOLAX/MIRALAX) powder Take 17 g by mouth 2 (two) times daily as needed. 3350 g 1  . Pyridoxine HCl (VITAMIN B-6) 250 MG tablet Take 1 tablet (250 mg total) by mouth daily. 30 tablet 12  . TURMERIC PO Take by mouth daily.     . vitamin E 200 UNIT capsule Take 200 Units by mouth daily.      . zaleplon (SONATA) 10 MG capsule Take 10 mg by mouth at bedtime as needed.     No current facility-administered medications on file prior to visit.    BP 102/70 mmHg  Pulse 79  Temp(Src) 98 F (36.7 C) (Oral)  Resp 16  Ht 5\' 7"  (1.702 m)  Wt 153 lb (69.4 kg)  BMI 23.96 kg/m2  SpO2 98%       Objective:   Physical Exam  Constitutional: She appears well-developed and well-nourished.  Cardiovascular: Normal rate, regular rhythm and normal heart sounds.   No murmur heard. Pulmonary/Chest: Effort normal and breath sounds normal. No respiratory distress. She has no wheezes.  Neurological:  No sensory deficit.  Reflex Scores:      Brachioradialis reflexes are 2+ on the right side and 2+ on the left side.      Patellar reflexes are 2+ on the right side and 2+ on the left side. Bilateral LE strength is 4-5/5 Bilateral feet sensitive to light touch with monofilament  Psychiatric: She has a normal mood and affect. Her behavior is normal. Judgment and thought content normal.          Assessment & Plan:

## 2015-08-05 NOTE — Patient Instructions (Addendum)
Complete lab work prior to leaving (UDS) Go to ER if you develop worsening numbness/weakness of the lower extremities, or if you develop shortness of breath. Please contact psychiatry to get a new patient appointment.   Psychiatric Services:  [facility], [place] [address] [place] [facility]ta Moyn[place], [address], [place], [place] [facility] [phone] [place], [place], [facility], [address], [place], [place]

## 2015-08-05 NOTE — Progress Notes (Signed)
Pre visit review using our clinic review tool, if applicable. No additional management support is needed unless otherwise documented below in the visit note. 

## 2015-08-06 ENCOUNTER — Encounter: Payer: Self-pay | Admitting: Family

## 2015-08-07 ENCOUNTER — Ambulatory Visit (INDEPENDENT_AMBULATORY_CARE_PROVIDER_SITE_OTHER): Payer: 59 | Admitting: Family

## 2015-08-07 ENCOUNTER — Encounter: Payer: Self-pay | Admitting: Family

## 2015-08-07 VITALS — BP 125/74 | HR 79 | Temp 97.9°F | Ht 67.0 in | Wt 152.4 lb

## 2015-08-07 DIAGNOSIS — G629 Polyneuropathy, unspecified: Secondary | ICD-10-CM | POA: Insufficient documentation

## 2015-08-07 DIAGNOSIS — G620 Drug-induced polyneuropathy: Secondary | ICD-10-CM

## 2015-08-07 DIAGNOSIS — G622 Polyneuropathy due to other toxic agents: Secondary | ICD-10-CM

## 2015-08-07 DIAGNOSIS — A084 Viral intestinal infection, unspecified: Secondary | ICD-10-CM | POA: Diagnosis not present

## 2015-08-07 DIAGNOSIS — T451X5A Adverse effect of antineoplastic and immunosuppressive drugs, initial encounter: Secondary | ICD-10-CM

## 2015-08-07 NOTE — Assessment & Plan Note (Signed)
Stable. Needs new psychiatrist- gave pt some numbers to try to set up new patient apt. She is requesting that we take over her benzo's temporarily. Refills provided, controlled substance contract signed and pt will provide UDS today.

## 2015-08-07 NOTE — Assessment & Plan Note (Addendum)
Due to hx chemo. Obtain b12 and folate levels.  Advised pt to continue gabapentin.  While I highly doubt GBS, I have asked the patient to go to the ER if she develops progressive numbness/weakness or shortness of breath and she verbalizes understanding. I have also asked the patient to follow back up with me in 2 days so that I can ensure that she remains stable.

## 2015-08-07 NOTE — Progress Notes (Signed)
Subjective:    Patient ID: Teresa Morgan, female    DOB: October 23, 1956, 59 y.o.   MRN: [id number]  HPI  Teresa Morgan is a 59 yr old female who presents today for 2 day follow up of her LE weakness and worsening LE neuropathy symptoms.   Reports that her LE weakness is improved.  Neuropathy/Numbness is unchanged- though definitely not worse.  Reports that she had nausea/vomitting yesterday at work. These symptoms are now resolved and energy is improved. Tolerating PO's. Denies fever.    Review of Systems See HPI  Past Medical History  Diagnosis Date  . Anemia   . Anxiety   . Arthritis   . Depression   . GERD (gastroesophageal reflux disease)   . Peptic ulcer   . Colon cancer   . COPD with emphysema 06/18/2012    Social History   Social History  . Marital Status: Married    Spouse Name: N/A  . Number of Children: 3  . 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 Smoking status: Former Smoker -- 1.00 packs/day for 20 years    Types: Cigarettes    Start date: 04/23/1986    Quit date: 11/23/2005  . Smokeless tobacco: Never Used     Comment: quit 8 years ago  . Alcohol Use: 0.0 oz/week     Comment: social  . Drug Use: No  . Sexual Activity: Not on file   Other Topics Concern  . 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 Heel spurs Right   . Hemrrhoids    . Plantar fascia surgery Right 2009  . Colon resection  2012    for colon cancer  . Tubal ligation  07/1980  . Appendectomy  07/07/11  . Portacath      Family History  Problem Relation Age of Onset  . Diabetes Mother   . Colon cancer Cousin     Allergies  Allergen Reactions  . Aspirin     Upset stomach  . Cymbalta [Duloxetine Hcl] Swelling    Swollen lips   . Prozac [Fluoxetine Hcl] Swelling   Lip swelling  . Wellbutrin [Bupropion] Swelling    Lip swelling    Current Outpatient Prescriptions on File Prior to Visit  Medication Sig Dispense Refill  . ALPRAZolam (XANAX) 1 MG tablet Take 1 tablet (1 mg total) by mouth at bedtime as needed. 30 tablet 0  . Calcium-Vitamin D 600-125 MG-UNIT TABS Take by mouth daily.      . Fluticasone Furoate-Vilanterol (BREO ELLIPTA) 100-25 MCG/INH AEPB INHALE 1 PUFF BY MOUTH EVERY DAY 60 each 3  . gabapentin (NEURONTIN) 600 MG tablet Take 1 tablet (600 mg total) by mouth 4 (four) times daily. 120 tablet 2  . LORazepam (ATIVAN) 0.5 MG tablet One tablet by mouth once daily in the AM as needed for anxiety 30 tablet 0  . Multiple Vitamin (MULTIVITAMIN) tablet Take 1 tablet by mouth daily.      Marland Kitchen oxyCODONE-acetaminophen (PERCOCET) 10-325 MG per tablet 1 - 2 tabs every 4 hours as needed for pain 120 tablet 0  . pantoprazole (PROTONIX) 40 MG tablet TAKE 1 TABLET BY MOUTH EVERY DAY 30 tablet 5  .  polyethylene glycol powder (GLYCOLAX/MIRALAX) powder Take 17 g by mouth 2 (two) times daily as needed. 3350 g 1  . Pyridoxine HCl (VITAMIN B-6) 250 MG tablet Take 1 tablet (250 mg total) by mouth daily. 30 tablet 12  . TURMERIC PO Take by mouth daily.     . vitamin E 200 UNIT capsule Take 200 Units by mouth daily.      . Vortioxetine HBr (TRINTELLIX) 5 MG TABS Take 1 tablet (5 mg total) by mouth daily. 30 tablet    No current facility-administered medications on file prior to visit.    BP 125/74 mmHg  Pulse 79  Temp(Src) 97.9 F (36.6 C) (Oral)  Ht 5\' 7"  (1.702 m)  Wt 152 lb 6.4 oz (69.128 kg)  BMI 23.86 kg/m2  SpO2 98%       Objective:   Physical Exam  Constitutional: She is oriented to person, place, and time. She appears well-developed and well-nourished.  HENT:  Head: Normocephalic and atraumatic.  Cardiovascular: Normal rate, regular rhythm and normal heart sounds.   No murmur heard. Pulmonary/Chest: Effort normal and breath sounds normal. No  respiratory distress. She has no wheezes.  Neurological: She is alert and oriented to person, place, and time.  Reflex Scores:      Patellar reflexes are 2+ on the right side and 2+ on the left side. Bilateral LE strength is 5/5 Bilateral feet- sensation intact to monofilament.   Psychiatric: She has a normal mood and affect. Her behavior is normal. Judgment and thought content normal.          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 Advised pt to call if recurrent symptoms.

## 2015-08-07 NOTE — Progress Notes (Signed)
Pre visit review using our clinic review tool, if applicable. No additional management support is needed unless otherwise documented below in the visit note. 

## 2015-08-10 ENCOUNTER — Ambulatory Visit (HOSPITAL_BASED_OUTPATIENT_CLINIC_OR_DEPARTMENT_OTHER)
Admission: RE | Admit: 2015-08-10 | Discharge: 2015-08-10 | Disposition: A | Discharge status: Home / Self Care | Payer: 59 | Source: Ambulatory Visit | Attending: Family | Admitting: Family

## 2015-08-10 DIAGNOSIS — Z0000 Encounter for general adult medical examination without abnormal findings: Secondary | ICD-10-CM

## 2015-08-10 DIAGNOSIS — Z1231 Encounter for screening mammogram for malignant neoplasm of breast: Secondary | ICD-10-CM | POA: Diagnosis not present

## 2015-08-17 ENCOUNTER — Telehealth: Payer: Self-pay | Admitting: *Deleted

## 2015-08-17 NOTE — Telephone Encounter (Signed)
Pt called requesting mammogram result and was notified normal.

## 2015-08-19 ENCOUNTER — Encounter: Payer: Self-pay | Admitting: Family

## 2015-08-24 ENCOUNTER — Other Ambulatory Visit: Payer: Self-pay | Admitting: *Deleted

## 2015-08-24 DIAGNOSIS — G629 Polyneuropathy, unspecified: Secondary | ICD-10-CM

## 2015-08-24 DIAGNOSIS — C180 Malignant neoplasm of cecum: Secondary | ICD-10-CM

## 2015-08-24 DIAGNOSIS — G4700 Insomnia, unspecified: Secondary | ICD-10-CM

## 2015-08-24 MED ORDER — OXYCODONE-ACETAMINOPHEN 10-325 MG PO TABS: ORAL_TABLET | ORAL | Status: DC

## 2015-08-30 ENCOUNTER — Other Ambulatory Visit: Payer: Self-pay | Admitting: Family

## 2015-09-03 ENCOUNTER — Ambulatory Visit: Payer: Self-pay | Admitting: Pulmonary Disease

## 2015-09-07 ENCOUNTER — Telehealth: Payer: Self-pay | Admitting: Family

## 2015-09-07 NOTE — Telephone Encounter (Signed)
Can be reached: [phone] before 3:30pm, [phone] after 3:30pm (work #) [facility] [place]  Reason for call: Pt needing refill on trintellix. She has 2 left and is taking 1/day. She lost the list of psychiatrists that was given to her. Please call her with this info.

## 2015-09-08 MED ORDER — VORTIOXETINE HBR 5 MG PO TABS: 1.0000 | ORAL_TABLET | Freq: Every day | ORAL | Status: DC

## 2015-09-08 NOTE — Telephone Encounter (Signed)
Left message for pt to return my call.

## 2015-09-08 NOTE — Telephone Encounter (Signed)
Melissa--please advise if ok to refill Trintellix?  I will provide pt with list of psychiatry contacts.

## 2015-09-08 NOTE — Telephone Encounter (Signed)
Refill sent.  She should arrange psych appointment though.

## 2015-09-09 NOTE — Telephone Encounter (Signed)
List of psychiatry offices from 08/05/15 office note given to pt.

## 2015-09-21 ENCOUNTER — Other Ambulatory Visit (HOSPITAL_BASED_OUTPATIENT_CLINIC_OR_DEPARTMENT_OTHER): Payer: 59

## 2015-09-21 ENCOUNTER — Ambulatory Visit (HOSPITAL_BASED_OUTPATIENT_CLINIC_OR_DEPARTMENT_OTHER): Payer: 59

## 2015-09-21 ENCOUNTER — Ambulatory Visit (HOSPITAL_BASED_OUTPATIENT_CLINIC_OR_DEPARTMENT_OTHER): Payer: 59 | Admitting: Hematology & Oncology

## 2015-09-21 ENCOUNTER — Encounter: Payer: Self-pay | Admitting: Hematology & Oncology

## 2015-09-21 VITALS — BP 136/80 | HR 83 | Temp 97.8°F | Resp 16 | Ht 67.0 in | Wt 157.0 lb

## 2015-09-21 DIAGNOSIS — C180 Malignant neoplasm of cecum: Secondary | ICD-10-CM

## 2015-09-21 DIAGNOSIS — C779 Secondary and unspecified malignant neoplasm of lymph node, unspecified: Secondary | ICD-10-CM

## 2015-09-21 DIAGNOSIS — G629 Polyneuropathy, unspecified: Secondary | ICD-10-CM

## 2015-09-21 DIAGNOSIS — G4700 Insomnia, unspecified: Secondary | ICD-10-CM

## 2015-09-21 DIAGNOSIS — J430 Unilateral pulmonary emphysema [MacLeod's syndrome]: Secondary | ICD-10-CM | POA: Diagnosis not present

## 2015-09-21 DIAGNOSIS — Z720 Tobacco use: Secondary | ICD-10-CM

## 2015-09-21 DIAGNOSIS — G620 Drug-induced polyneuropathy: Secondary | ICD-10-CM | POA: Diagnosis not present

## 2015-09-21 LAB — COMPREHENSIVE METABOLIC PANEL (CC13)
ALK PHOS: 77 U/L (ref 40–150)
ALT: 13 U/L (ref 0–55)
AST: 15 U/L (ref 5–34)
Albumin: 4 g/dL (ref 3.5–5.0)
Anion Gap: 7 mEq/L (ref 3–11)
BUN: 9.8 mg/dL (ref 7.0–26.0)
CALCIUM: 9.2 mg/dL (ref 8.4–10.4)
CHLORIDE: 107 meq/L (ref 98–109)
CO2: 27 mEq/L (ref 22–29)
Creatinine: 0.8 mg/dL (ref 0.6–1.1)
EGFR: 77 mL/min/{1.73_m2} — AB (ref >90)
Glucose: 80 mg/dl (ref 70–140)
POTASSIUM: 3.8 meq/L (ref 3.5–5.1)
Sodium: 142 mEq/L (ref 136–145)
Total Bilirubin: 0.4 mg/dL (ref 0.20–1.20)
Total Protein: 7.1 g/dL (ref 6.4–8.3)

## 2015-09-21 LAB — CBC WITH DIFFERENTIAL (CANCER CENTER ONLY)
BASO#: 0 10*3/uL (ref 0.0–0.2)
BASO%: 0.1 % (ref 0.0–2.0)
EOS ABS: 0.1 10*3/uL (ref 0.0–0.5)
EOS%: 1.6 % (ref 0.0–7.0)
HCT: 43.9 % (ref 34.8–46.6)
HGB: 14.4 g/dL (ref 11.6–15.9)
LYMPH#: 1.7 10*3/uL (ref 0.9–3.3)
LYMPH%: 25.2 % (ref 14.0–48.0)
MCH: 22.7 pg — AB (ref 26.0–34.0)
MCHC: 32.8 g/dL (ref 32.0–36.0)
MCV: 69 fL — ABNORMAL LOW (ref 81–101)
MONO#: 0.4 10*3/uL (ref 0.1–0.9)
MONO%: 5.4 % (ref 0.0–13.0)
NEUT#: 4.7 10*3/uL (ref 1.5–6.5)
NEUT%: 67.7 % (ref 39.6–80.0)
Platelets: 157 10*3/uL (ref 145–400)
RBC: 6.35 10*6/uL — ABNORMAL HIGH (ref 3.70–5.32)
RDW: 17.2 % — AB (ref 11.1–15.7)
WBC: 6.9 10*3/uL (ref 3.9–10.0)

## 2015-09-21 MED ORDER — SODIUM CHLORIDE 0.9 % IJ SOLN
10.0000 mL | INTRAMUSCULAR | Status: DC | PRN
  Administered 2015-09-21: 10 mL via INTRAVENOUS
  Filled 2015-09-21: qty 10

## 2015-09-21 MED ORDER — ALPRAZOLAM 1 MG PO TABS
1.0000 mg | ORAL_TABLET | Freq: Every evening | ORAL | Status: DC | PRN
Start: 2015-09-21 — End: 2015-10-26

## 2015-09-21 MED ORDER — OXYCODONE-ACETAMINOPHEN 10-325 MG PO TABS: ORAL_TABLET | ORAL | Status: DC

## 2015-09-21 MED ORDER — HEPARIN SOD (PORK) LOCK FLUSH 100 UNIT/ML IV SOLN
500.0000 [IU] | Freq: Once | INTRAVENOUS | Status: AC
  Administered 2015-09-21: 500 [IU] via INTRAVENOUS
  Filled 2015-09-21: qty 5

## 2015-09-21 NOTE — Progress Notes (Signed)
Teresa Morgan presented for Portacath access and flush. Proper placement of portacath confirmed by CXR. Portacath located in the left chest wall accessed with  H 20 needle. Clean, Dry and Intact No blood return and  Portacath flushed with 27ml NS and 500U/16ml Heparin per protocol and needle removed intact. Procedure without incident. Patient tolerated procedure well.

## 2015-09-21 NOTE — Patient Instructions (Signed)

## 2015-09-22 ENCOUNTER — Telehealth: Payer: Self-pay | Admitting: *Deleted

## 2015-09-22 NOTE — Telephone Encounter (Addendum)
Message left on personal voice mail  ----- Message from Volanda Napoleon, MD sent at 09/22/2015  6:29 AM EST ----- Call - tumor level is normal!! We still need the ct scan to be done!!!  pete

## 2015-09-22 NOTE — Progress Notes (Signed)
Hematology and Oncology Follow Up Visit  Teresa Morgan [id number] [birth year] 59 y.o. 09/22/2015   Principle Diagnosis:  1. Stage IIIB (T2 N2 M0) adenocarcinoma of the cecum. 2. Neuropathy secondary to chemotherapy.  Current Therapy:    Observation     Interim History:  Teresa Morgan is back for followup. She is doing pretty well. 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 This is easier on her with the neuropathy that she has.  The neuropathy seems to be holding steady. It does not seem to be worsening. She has occasional cramps in her calf muscles.  She, unfortunately, is back to smoking. She says she's by smoke about a pack a day now. She is under a lot of stress. I think some of the stress is at home.  She clearly is be followed up with a CT scan. I think with the smoking, she definitely is at much higher risk of recurrence and also for a primary lung cancer.  She's had no obvious change in bowel or bladder habits. She's had no bleeding. She's had no leg swelling. She's had no rashes.  Her performance status is ECOG 1  Medications:  Current outpatient prescriptions:  .  ALPRAZolam (XANAX) 1 MG tablet, Take 1 tablet (1 mg total) by mouth at bedtime as needed., Disp: 30 tablet, Rfl: 0 .  Calcium-Vitamin D 600-125 MG-UNIT TABS, Take by mouth daily.  , Disp: , Rfl:  .  Fluticasone Furoate-Vilanterol (BREO ELLIPTA) 100-25 MCG/INH AEPB, INHALE 1 PUFF BY MOUTH EVERY DAY, Disp: 60 each, Rfl: 3 .  gabapentin (NEURONTIN) 600 MG tablet, Take 1 tablet (600 mg total) by mouth 4 (four) times daily., Disp: 120 tablet, Rfl: 2 .  LORazepam (ATIVAN) 0.5 MG tablet, One tablet by mouth once daily in the AM as needed for anxiety, Disp: 30 tablet, Rfl: 0 .  Multiple Vitamin (MULTIVITAMIN) tablet, Take 1 tablet by mouth daily.  , Disp: , Rfl:  .  oxyCODONE-acetaminophen (PERCOCET) 10-325 MG tablet, 1 - 2 tabs every 4 hours as needed for pain, Disp: 120 tablet, Rfl: 0 .   pantoprazole (PROTONIX) 40 MG tablet, TAKE 1 TABLET BY MOUTH EVERY DAY, Disp: 30 tablet, Rfl: 5 .  polyethylene glycol powder (GLYCOLAX/MIRALAX) powder, Take 17 g by mouth 2 (two) times daily as needed., Disp: 3350 g, Rfl: 1 .  Pyridoxine HCl (VITAMIN B-6) 250 MG tablet, Take 1 tablet (250 mg total) by mouth daily., Disp: 30 tablet, Rfl: 12 .  TURMERIC PO, Take by mouth daily. , Disp: , Rfl:  .  vitamin E 200 UNIT capsule, Take 200 Units by mouth daily.  , Disp: , Rfl:  .  Vortioxetine HBr (TRINTELLIX) 5 MG TABS, Take 1 tablet (5 mg total) by mouth daily., Disp: 30 tablet, Rfl: 2  Allergies:  Allergies  Allergen Reactions  . Aspirin     Upset stomach  . Cymbalta [Duloxetine Hcl] Swelling    Swollen lips   . Prozac [Fluoxetine Hcl] Swelling    Lip swelling  . Wellbutrin [Bupropion] Swelling    Lip swelling    Past Medical History, Surgical history, Social history, and Family History were reviewed and updated.  Review of Systems: As above  Physical Exam:  height is 5\' 7"  (1.702 m) and weight is 157 lb (71.215 kg). Her oral temperature is 97.8 F (36.6 C). Her blood pressure is 136/80 and her pulse is 83. Her respiration is 16.   Head and  neck exam shows no ocular or oral lesions. She has no adenopathy in the neck. There is no scleral icterus. Lungs are clear. There are no rales, wheezes or rhonchi are noted. Cardiac exam regular rate and rhythm with no murmurs rubs or bruits. Abdomen is soft. She has good bowel sounds. There is no fluid wave. There is no palpable liver or spleen tip. She has well-healed laparoscopy scar. Back exam shows no tenderness over the spine, ribs or hips. Extremities shows no clubbing cyanosis or edema. Neurological exam shows no focal neurological deficits. Skin exam no rashes, ecchymoses or petechia.  Lab Results  Component Value Date   WBC 6.9 09/21/2015   HGB 14.4 09/21/2015   HCT 43.9 09/21/2015   MCV 69* 09/21/2015   PLT 157 09/21/2015     Chemistry       Component Value Date/Time   NA 142 09/21/2015 1306   NA 138 03/23/2015 1003   NA 141 09/22/2014 1146   K 3.8 09/21/2015 1306   K 3.8 03/23/2015 1003   K 3.3 09/22/2014 1146   CL 105 03/23/2015 1003   CL 104 09/22/2014 1146   CO2 27 09/21/2015 1306   CO2 26 03/23/2015 1003   CO2 28 09/22/2014 1146   BUN 9.8 09/21/2015 1306   BUN 12 03/23/2015 1003   BUN 11 09/22/2014 1146   CREATININE 0.8 09/21/2015 1306   CREATININE 0.86 03/23/2015 1003   CREATININE 0.8 09/22/2014 1146      Component Value Date/Time   CALCIUM 9.2 09/21/2015 1306   CALCIUM 8.8 03/23/2015 1003   CALCIUM 8.8 09/22/2014 1146   ALKPHOS 77 09/21/2015 1306   ALKPHOS 66 03/23/2015 1003   ALKPHOS 70 09/22/2014 1146   AST 15 09/21/2015 1306   AST 15 03/23/2015 1003   AST 18 09/22/2014 1146   ALT 13 09/21/2015 1306   ALT 11 03/23/2015 1003   ALT 13 09/22/2014 1146   BILITOT 0.40 09/21/2015 1306   BILITOT 0.4 03/23/2015 1003   BILITOT 0.60 09/22/2014 1146         Impression and Plan: Teresa Morgan is 59 year old female with stage IIIB colon cancer. This was of the cecum. She underwent resection back in September of 2012. She had 4 positive lymph nodes. She completed chemotherapy in March of 2013.  She still has a risk of recurrence.   With her smoking, I think we probably have to do a CT of the chest. If I will see about planning to get this set up in the next few months.  I will plan to see her back in March. We will get the CT scan the same day that we see her.  I spent about 25-30 minutes with her today. I told her about smoking cessation. She is going to try her best to cut back.   Teresa Napoleon, MD 11/8/20168:18 AM

## 2015-10-26 ENCOUNTER — Other Ambulatory Visit: Payer: Self-pay | Admitting: *Deleted

## 2015-10-26 DIAGNOSIS — J430 Unilateral pulmonary emphysema [MacLeod's syndrome]: Secondary | ICD-10-CM

## 2015-10-26 DIAGNOSIS — G4700 Insomnia, unspecified: Secondary | ICD-10-CM

## 2015-10-26 DIAGNOSIS — G629 Polyneuropathy, unspecified: Secondary | ICD-10-CM

## 2015-10-26 DIAGNOSIS — C180 Malignant neoplasm of cecum: Secondary | ICD-10-CM

## 2015-10-26 MED ORDER — ALPRAZOLAM 1 MG PO TABS: 1.0000 mg | ORAL_TABLET | Freq: Every evening | ORAL | Status: DC | PRN

## 2015-10-26 MED ORDER — OXYCODONE-ACETAMINOPHEN 10-325 MG PO TABS: ORAL_TABLET | ORAL | Status: DC

## 2015-11-17 ENCOUNTER — Telehealth: Payer: Self-pay | Admitting: Family

## 2015-11-17 MED ORDER — VORTIOXETINE HBR 5 MG PO TABS: 1.0000 | ORAL_TABLET | Freq: Every day | ORAL | Status: DC

## 2015-11-17 NOTE — Telephone Encounter (Signed)
Caller name: Self   Can be reached: [phone]  Pharmacy:  [id number] [place], [place] [phone] (Phone) [phone] (Fax)         Reason for call: Request refill on Vortioxetine HBr (TRINTELLIX) 5 MG TABS [id number]

## 2015-11-17 NOTE — Telephone Encounter (Signed)
Pt last seen by PCP 08/07/15 and is due for 6 month f/u around 02/04/16.  Refills sent. Sent mychart message to pt.

## 2015-11-26 ENCOUNTER — Ambulatory Visit: Payer: Self-pay | Admitting: Pulmonary Disease

## 2015-11-27 ENCOUNTER — Other Ambulatory Visit: Payer: Self-pay | Admitting: *Deleted

## 2015-11-27 DIAGNOSIS — J430 Unilateral pulmonary emphysema [MacLeod's syndrome]: Secondary | ICD-10-CM

## 2015-11-27 DIAGNOSIS — G4700 Insomnia, unspecified: Secondary | ICD-10-CM

## 2015-11-27 DIAGNOSIS — G629 Polyneuropathy, unspecified: Secondary | ICD-10-CM

## 2015-11-27 DIAGNOSIS — C180 Malignant neoplasm of cecum: Secondary | ICD-10-CM

## 2015-11-27 MED ORDER — OXYCODONE-ACETAMINOPHEN 10-325 MG PO TABS: ORAL_TABLET | ORAL | Status: DC

## 2015-11-27 MED FILL — OXYCODONE/APAP 10/325 MG TA: 10-325 | 10 days supply | Qty: 120 | Fill #0

## 2015-11-28 ENCOUNTER — Other Ambulatory Visit: Payer: Self-pay | Admitting: Hematology & Oncology

## 2015-12-01 ENCOUNTER — Other Ambulatory Visit: Payer: Self-pay | Admitting: *Deleted

## 2015-12-01 DIAGNOSIS — G629 Polyneuropathy, unspecified: Secondary | ICD-10-CM

## 2015-12-01 DIAGNOSIS — C180 Malignant neoplasm of cecum: Secondary | ICD-10-CM

## 2015-12-01 DIAGNOSIS — G4700 Insomnia, unspecified: Secondary | ICD-10-CM

## 2015-12-01 DIAGNOSIS — J430 Unilateral pulmonary emphysema [MacLeod's syndrome]: Secondary | ICD-10-CM

## 2015-12-01 MED ORDER — ALPRAZOLAM 1 MG PO TABS: 1.0000 mg | ORAL_TABLET | Freq: Every evening | ORAL | Status: DC | PRN

## 2015-12-03 ENCOUNTER — Encounter: Payer: Self-pay | Admitting: Adult Health

## 2015-12-03 ENCOUNTER — Ambulatory Visit (INDEPENDENT_AMBULATORY_CARE_PROVIDER_SITE_OTHER): Payer: 59 | Admitting: Adult Health

## 2015-12-03 VITALS — BP 117/81 | HR 92 | Temp 97.6°F | Ht 69.0 in | Wt 155.0 lb

## 2015-12-03 DIAGNOSIS — R911 Solitary pulmonary nodule: Secondary | ICD-10-CM | POA: Diagnosis not present

## 2015-12-03 DIAGNOSIS — J439 Emphysema, unspecified: Secondary | ICD-10-CM

## 2015-12-03 MED ORDER — FLUTICASONE FUROATE-VILANTEROL 100-25 MCG/INH IN AEPB: INHALATION_SPRAY | RESPIRATORY_TRACT | Status: DC

## 2015-12-03 MED ORDER — FLUTICASONE FUROATE-VILANTEROL 100-25 MCG/INH IN AEPB: 1.0000 | INHALATION_SPRAY | Freq: Every day | RESPIRATORY_TRACT | Status: DC

## 2015-12-03 NOTE — Assessment & Plan Note (Signed)
Follow up for CT chest per Onoclogy as planned

## 2015-12-03 NOTE — Patient Instructions (Signed)
Continue on BREO daily , rinse after use.  Follow up as planned with oncology for CT chest in Spring.  Work on not smoking  Follow up Dr. Elsworth Soho  In 1 year and As needed

## 2015-12-03 NOTE — Assessment & Plan Note (Signed)
Controlled  Smoking cessation is key   Plan  Continue on BREO daily , rinse after use.  Follow up as planned with oncology for CT chest in Spring.  Work on not smoking  Follow up Dr. Elsworth Soho  In 1 year and As needed

## 2015-12-03 NOTE — Progress Notes (Signed)
Subjective:    Patient ID: Teresa Morgan, female    DOB: 04/10/1956, 59 y.o.   MRN: [id number]  HPI 60 yo female smoker with GOLD B COPD/Emphysema.  Hx of Cecum adenocarcinoma Stage III B f/by Dr. Martha Clan.  S/p resection and chemo 2012-2013  12/03/2015 Follow up : COPD  Pt returns for follow up for COPD  Last seen >2 years ago. She says overall she is doing okay on BREO .  Does have intermittent congestion with white mucuc.  Gets winded some with activity.  Denies chest pain, orthopnea, edema, fever or n/v/d.  Has restarted smoking , discussed cessation.  Mom passed away last week, under a lot of stress.   Follows with Dr. Marin Olp for colon cancer. S/p rsx in 2012 and chemo.  CT chest in 2015 with tiny nodules . Planned repeat CT in March .        Past Medical History  Diagnosis Date  . Anemia   . Anxiety   . Arthritis   . Depression   . GERD (gastroesophageal reflux disease)   . Peptic ulcer   . Colon cancer ([place])   . COPD with emphysema ([place]) 06/18/2012   Current Outpatient Prescriptions on File Prior to Visit  Medication Sig Dispense Refill  . ALPRAZolam (XANAX) 1 MG tablet Take 1 tablet (1 mg total) by mouth at bedtime as needed. 30 tablet 0  . Calcium-Vitamin D 600-125 MG-UNIT TABS Take by mouth daily.      . Fluticasone Furoate-Vilanterol (BREO ELLIPTA) 100-25 MCG/INH AEPB INHALE 1 PUFF BY MOUTH EVERY DAY 60 each 3  . gabapentin (NEURONTIN) 600 MG tablet TAKE 1 TABLET (600 MG TOTAL) BY MOUTH 4 (FOUR) TIMES DAILY. 120 tablet 2  . LORazepam (ATIVAN) 0.5 MG tablet One tablet by mouth once daily in the AM as needed for anxiety 30 tablet 0  . Multiple Vitamin (MULTIVITAMIN) tablet Take 1 tablet by mouth daily.      Marland Kitchen oxyCODONE-acetaminophen (PERCOCET) 10-325 MG tablet 1 - 2 tabs every 4 hours as needed for pain 120 tablet 0  . pantoprazole (PROTONIX) 40 MG tablet TAKE 1 TABLET BY MOUTH EVERY DAY 30 tablet 5  . polyethylene glycol powder (GLYCOLAX/MIRALAX) powder Take  17 g by mouth 2 (two) times daily as needed. 3350 g 1  . Pyridoxine HCl (VITAMIN B-6) 250 MG tablet Take 1 tablet (250 mg total) by mouth daily. 30 tablet 12  . TURMERIC PO Take by mouth daily.     . vitamin E 200 UNIT capsule Take 200 Units by mouth daily.      . Vortioxetine HBr (TRINTELLIX) 5 MG TABS Take 1 tablet (5 mg total) by mouth daily. 30 tablet 3   No current facility-administered medications on file prior to visit.      Review of Systems Constitutional:   No  weight loss, night sweats,  Fevers, chills, fatigue, or  lassitude.  HEENT:   No headaches,  Difficulty swallowing,  Tooth/dental problems, or  Sore throat,                No sneezing, itching, ear ache, nasal congestion, post nasal drip,   CV:  No chest pain,  Orthopnea, PND, swelling in lower extremities, anasarca, dizziness, palpitations, syncope.   GI  No heartburn, indigestion, abdominal pain, nausea, vomiting, diarrhea, change in bowel habits, loss of appetite, bloody stools.   Resp:    No chest wall deformity  Skin: no rash or lesions.  GU: no dysuria,  change in color of urine, no urgency or frequency.  No flank pain, no hematuria   MS:  No joint pain or swelling.  No decreased range of motion.  No back pain.  Psych:  No change in mood or affect. No depression or anxiety.  No memory loss.         Objective:   Physical Exam  Filed Vitals:   12/03/15 1143  BP: 117/81  Pulse: 92  Temp: 97.6 F (36.4 C)  TempSrc: Oral  Height: 5\' 9"  (1.753 m)  Weight: 155 lb (70.308 kg)  SpO2: 97%    GEN: A/Ox3; pleasant , NAD   HEENT:  [place]/AT,  EACs-clear, TMs-wnl, NOSE-clear, THROAT-clear, no lesions, no postnasal drip or exudate noted.   NECK:  Supple w/ fair ROM; no JVD; normal carotid impulses w/o bruits; no thyromegaly or nodules palpated; no lymphadenopathy.  RESP  Decreased BS in bases no accessory muscle use, no dullness to percussion  CARD:  RRR, no m/r/g  , no peripheral edema, pulses intact, no  cyanosis or clubbing.  GI:   Soft & nt; nml bowel sounds; no organomegaly or masses detected.  Musco: Warm bil, no deformities or joint swelling noted.   Neuro: alert, no focal deficits noted.    Skin: Warm, no lesions or rashes       Assessment & Plan:

## 2015-12-06 NOTE — Progress Notes (Signed)
Reviewed & agree with plan  

## 2015-12-07 ENCOUNTER — Telehealth: Payer: Self-pay | Admitting: *Deleted

## 2015-12-07 NOTE — Telephone Encounter (Signed)
Received email from covermymeds to initiate PA for Trintellix. PA submitted to [employer] via covermymeds and waiting for approval status.

## 2015-12-07 NOTE — Telephone Encounter (Signed)
Received Approval from [facility] for Trintellix, valid through 12/06/2016; faxed approval to [place] at [phone]/SLS 01/23

## 2015-12-10 ENCOUNTER — Ambulatory Visit (HOSPITAL_BASED_OUTPATIENT_CLINIC_OR_DEPARTMENT_OTHER): Payer: 59

## 2015-12-10 VITALS — BP 117/76 | HR 81 | Temp 97.5°F

## 2015-12-10 DIAGNOSIS — C180 Malignant neoplasm of cecum: Secondary | ICD-10-CM | POA: Diagnosis not present

## 2015-12-10 DIAGNOSIS — Z452 Encounter for adjustment and management of vascular access device: Secondary | ICD-10-CM

## 2015-12-10 MED ORDER — SODIUM CHLORIDE 0.9% FLUSH
10.0000 mL | INTRAVENOUS | Status: DC | PRN
  Administered 2015-12-10: 10 mL via INTRAVENOUS
  Filled 2015-12-10: qty 10

## 2015-12-10 MED ORDER — HEPARIN SOD (PORK) LOCK FLUSH 100 UNIT/ML IV SOLN
500.0000 [IU] | Freq: Once | INTRAVENOUS | Status: AC
  Administered 2015-12-10: 500 [IU] via INTRAVENOUS
  Filled 2015-12-10: qty 5

## 2015-12-10 NOTE — Patient Instructions (Signed)

## 2015-12-10 NOTE — Progress Notes (Signed)
Teresa Morgan presented for Portacath access and flush. Proper placement of portacath confirmed by CXR. Portacath located in the left chest wall accessed with  H 20 needle. Clean, Dry and Intact Good blood return present. Portacath flushed with 71ml NS and 500U/69ml Heparin per protocol and needle removed intact. Procedure without incident. Patient tolerated procedure well.

## 2015-12-11 MED FILL — BREO ELLIPTA 100-25 MCG INH: 100-25 | 30 days supply | Qty: 60 | Fill #0

## 2015-12-17 ENCOUNTER — Telehealth: Payer: Self-pay | Admitting: Adult Health

## 2015-12-17 NOTE — Telephone Encounter (Addendum)
Received FMLA forms for patient. On papers was a note stating "on the duration I work 2nd shift, so if you could do 8 hours"  Need clarification on what she is asking for. LVM for patient to return call and ask for [facility] TP aware of FMLA forms

## 2015-12-22 NOTE — Telephone Encounter (Signed)
LVM for patient to return call. 

## 2015-12-24 ENCOUNTER — Other Ambulatory Visit: Payer: Self-pay | Admitting: *Deleted

## 2015-12-24 DIAGNOSIS — C180 Malignant neoplasm of cecum: Secondary | ICD-10-CM

## 2015-12-24 DIAGNOSIS — J430 Unilateral pulmonary emphysema [MacLeod's syndrome]: Secondary | ICD-10-CM

## 2015-12-24 DIAGNOSIS — G629 Polyneuropathy, unspecified: Secondary | ICD-10-CM

## 2015-12-24 DIAGNOSIS — G4700 Insomnia, unspecified: Secondary | ICD-10-CM

## 2015-12-24 MED ORDER — OXYCODONE-ACETAMINOPHEN 10-325 MG PO TABS: ORAL_TABLET | ORAL | Status: DC

## 2015-12-24 NOTE — Telephone Encounter (Signed)
LVM for pt to return call

## 2015-12-25 MED FILL — OXYCODONE-APAP 10-325 TAB: 10-325 | 10 days supply | Qty: 120 | Fill #0

## 2015-12-28 NOTE — Telephone Encounter (Signed)
Called and spoke with pt. Pt explained that the FMLA forms need to be completed in a way that covers her working 2nd shift for the [employer] due to her COPD. Allowing her 2-3 absences a week if needed. I explained to her that the forms are not completed at this time due to the need for clarification. I informed her that once the forms are completed and signed by TP they will be placed at the front of the office. I did make her aware that once completed I would call her to inform her. She voiced understanding and had no further questions.  Will place forms in TP's look at

## 2015-12-30 NOTE — Telephone Encounter (Signed)
FMLA forms have been completed. LVM for patient to verify if pick up location.

## 2016-01-01 ENCOUNTER — Other Ambulatory Visit: Payer: Self-pay | Admitting: *Deleted

## 2016-01-01 DIAGNOSIS — G629 Polyneuropathy, unspecified: Secondary | ICD-10-CM

## 2016-01-01 DIAGNOSIS — J430 Unilateral pulmonary emphysema [MacLeod's syndrome]: Secondary | ICD-10-CM

## 2016-01-01 DIAGNOSIS — C180 Malignant neoplasm of cecum: Secondary | ICD-10-CM

## 2016-01-01 DIAGNOSIS — G4700 Insomnia, unspecified: Secondary | ICD-10-CM

## 2016-01-01 MED ORDER — ALPRAZOLAM 1 MG PO TABS: 1.0000 mg | ORAL_TABLET | Freq: Every evening | ORAL | Status: DC | PRN

## 2016-01-01 MED FILL — ALPRAZolam 1 MG TABS: 1 | 30 days supply | Qty: 30 | Fill #0

## 2016-01-06 NOTE — Telephone Encounter (Signed)
LVM for patient to return call. 

## 2016-01-07 NOTE — Telephone Encounter (Signed)
LVM on mobile # [phone] for pt to return call.  LVM on home # [phone] for pt to return call.  Attempted to call pt's work # [phone] someone picked up but never said anything.

## 2016-01-11 NOTE — Telephone Encounter (Signed)
Called and spoke with pt. Informed her that her FMLA papers would be at the [facility] office on Thursday (01/14/16). She voiced understanding and had no further questions. Will give FLMA forms to [place] on 01/13/16 to take to [facility] office on 01/14/16.

## 2016-01-18 ENCOUNTER — Ambulatory Visit (HOSPITAL_BASED_OUTPATIENT_CLINIC_OR_DEPARTMENT_OTHER)
Admission: RE | Admit: 2016-01-18 | Discharge: 2016-01-18 | Disposition: A | Discharge status: Home / Self Care | Payer: 59 | Source: Ambulatory Visit | Attending: Hematology & Oncology | Admitting: Hematology & Oncology

## 2016-01-18 ENCOUNTER — Encounter: Payer: Self-pay | Admitting: Hematology & Oncology

## 2016-01-18 ENCOUNTER — Ambulatory Visit: Payer: 59

## 2016-01-18 ENCOUNTER — Other Ambulatory Visit (HOSPITAL_BASED_OUTPATIENT_CLINIC_OR_DEPARTMENT_OTHER): Payer: 59

## 2016-01-18 ENCOUNTER — Ambulatory Visit (HOSPITAL_BASED_OUTPATIENT_CLINIC_OR_DEPARTMENT_OTHER): Payer: 59 | Admitting: Hematology & Oncology

## 2016-01-18 VITALS — BP 132/82 | HR 72 | Temp 97.6°F | Resp 18 | Ht 69.0 in | Wt 154.0 lb

## 2016-01-18 DIAGNOSIS — R918 Other nonspecific abnormal finding of lung field: Secondary | ICD-10-CM | POA: Insufficient documentation

## 2016-01-18 DIAGNOSIS — J430 Unilateral pulmonary emphysema [MacLeod's syndrome]: Secondary | ICD-10-CM | POA: Diagnosis not present

## 2016-01-18 DIAGNOSIS — R911 Solitary pulmonary nodule: Secondary | ICD-10-CM

## 2016-01-18 DIAGNOSIS — G4700 Insomnia, unspecified: Secondary | ICD-10-CM | POA: Insufficient documentation

## 2016-01-18 DIAGNOSIS — G629 Polyneuropathy, unspecified: Secondary | ICD-10-CM

## 2016-01-18 DIAGNOSIS — J439 Emphysema, unspecified: Secondary | ICD-10-CM | POA: Insufficient documentation

## 2016-01-18 DIAGNOSIS — Z85038 Personal history of other malignant neoplasm of large intestine: Secondary | ICD-10-CM | POA: Diagnosis not present

## 2016-01-18 DIAGNOSIS — C180 Malignant neoplasm of cecum: Secondary | ICD-10-CM

## 2016-01-18 DIAGNOSIS — Z720 Tobacco use: Secondary | ICD-10-CM | POA: Diagnosis not present

## 2016-01-18 DIAGNOSIS — Z95828 Presence of other vascular implants and grafts: Secondary | ICD-10-CM

## 2016-01-18 LAB — CBC WITH DIFFERENTIAL (CANCER CENTER ONLY)
BASO#: 0 10*3/uL (ref 0.0–0.2)
BASO%: 0.1 % (ref 0.0–2.0)
EOS%: 2 % (ref 0.0–7.0)
Eosinophils Absolute: 0.2 10*3/uL (ref 0.0–0.5)
HEMATOCRIT: 41.6 % (ref 34.8–46.6)
HGB: 13.9 g/dL (ref 11.6–15.9)
LYMPH#: 2.1 10*3/uL (ref 0.9–3.3)
LYMPH%: 22.1 % (ref 14.0–48.0)
MCH: 23.3 pg — ABNORMAL LOW (ref 26.0–34.0)
MCHC: 33.4 g/dL (ref 32.0–36.0)
MCV: 70 fL — AB (ref 81–101)
MONO#: 0.4 10*3/uL (ref 0.1–0.9)
MONO%: 4 % (ref 0.0–13.0)
NEUT#: 6.7 10*3/uL — ABNORMAL HIGH (ref 1.5–6.5)
NEUT%: 71.8 % (ref 39.6–80.0)
PLATELETS: 159 10*3/uL (ref 145–400)
RBC: 5.97 10*6/uL — AB (ref 3.70–5.32)
RDW: 16.9 % — ABNORMAL HIGH (ref 11.1–15.7)
WBC: 9.4 10*3/uL (ref 3.9–10.0)

## 2016-01-18 LAB — CMP (CANCER CENTER ONLY)
ALK PHOS: 63 U/L (ref 26–84)
ALT: 23 U/L (ref 10–47)
AST: 24 U/L (ref 11–38)
Albumin: 3.8 g/dL (ref 3.3–5.5)
BILIRUBIN TOTAL: 0.7 mg/dL (ref 0.20–1.60)
BUN: 13 mg/dL (ref 7–22)
CALCIUM: 8.5 mg/dL (ref 8.0–10.3)
CO2: 28 mEq/L (ref 18–33)
Chloride: 107 mEq/L (ref 98–108)
Creat: 0.9 mg/dl (ref 0.6–1.2)
GLUCOSE: 92 mg/dL (ref 73–118)
POTASSIUM: 3.9 meq/L (ref 3.3–4.7)
Sodium: 141 mEq/L (ref 128–145)
TOTAL PROTEIN: 6.8 g/dL (ref 6.4–8.1)

## 2016-01-18 MED ORDER — SODIUM CHLORIDE 0.9% FLUSH
10.0000 mL | INTRAVENOUS | Status: DC | PRN
  Administered 2016-01-18: 10 mL via INTRAVENOUS
  Filled 2016-01-18: qty 10

## 2016-01-18 MED ORDER — OXYCODONE-ACETAMINOPHEN 10-325 MG PO TABS: ORAL_TABLET | ORAL | Status: DC

## 2016-01-18 MED ORDER — ALPRAZOLAM 1 MG PO TABS: 1.0000 mg | ORAL_TABLET | Freq: Every evening | ORAL | Status: DC | PRN

## 2016-01-18 MED ORDER — IOHEXOL 300 MG/ML  SOLN
80.0000 mL | Freq: Once | INTRAMUSCULAR | Status: AC | PRN
  Administered 2016-01-18: 80 mL via INTRAVENOUS

## 2016-01-18 MED ORDER — HEPARIN SOD (PORK) LOCK FLUSH 100 UNIT/ML IV SOLN
500.0000 [IU] | Freq: Once | INTRAVENOUS | Status: AC
  Administered 2016-01-18: 500 [IU] via INTRAVENOUS
  Filled 2016-01-18: qty 5

## 2016-01-18 NOTE — Progress Notes (Signed)
Hematology and Oncology Follow Up Visit  Teresa Morgan [id number] [birth year] 60 y.o. 01/18/2016   Principle Diagnosis:  1. Stage IIIB (T2 N2 M0) adenocarcinoma of the cecum. 2. Neuropathy secondary to chemotherapy.  Current Therapy:    Observation     Interim History:  Ms.  Teresa Morgan is back for followup. She is doing pretty well. 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   We did go ahead and do a CT scan today. The CT scan did not show any changes with respect to the pulmonary nodules in the right lower lobe. They are chronic and stable. Other nodules have resolved.  She is still smoking. She is trying her best to cut back.  She's had no problems with her bowels or bladder.  Her last mammogram was recently. She had to have this done back in September 2016. Everything looked fine.  She's had no problems with her skin. She's had no joint issues. She's had no nausea or vomiting.  She wants to have a Port-A-Cath taken out. I will see back in this removed.  Her performance status is ECOG 1  Medications:  Current outpatient prescriptions:  .  ALPRAZolam (XANAX) 1 MG tablet, Take 1 tablet (1 mg total) by mouth at bedtime as needed., Disp: 30 tablet, Rfl: 0 .  Calcium-Vitamin D 600-125 MG-UNIT TABS, Take by mouth daily.  , Disp: , Rfl:  .  Fluticasone Furoate-Vilanterol (BREO ELLIPTA) 100-25 MCG/INH AEPB, Inhale 1 puff into the lungs daily., Disp: 14 each, Rfl: 0 .  Fluticasone Furoate-Vilanterol (BREO ELLIPTA) 100-25 MCG/INH AEPB, INHALE 1 PUFF BY MOUTH EVERY DAY, Disp: 60 each, Rfl: 3 .  gabapentin (NEURONTIN) 600 MG tablet, TAKE 1 TABLET (600 MG TOTAL) BY MOUTH 4 (FOUR) TIMES DAILY., Disp: 120 tablet, Rfl: 2 .  LORazepam (ATIVAN) 0.5 MG tablet, One tablet by mouth once daily in the AM as needed for anxiety, Disp: 30 tablet, Rfl: 0 .  Multiple Vitamin (MULTIVITAMIN) tablet, Take 1 tablet by mouth daily.  , Disp: , Rfl:  .  oxyCODONE-acetaminophen (PERCOCET)  10-325 MG tablet, 1 - 2 tabs every 4 hours as needed for pain, Disp: 120 tablet, Rfl: 0 .  pantoprazole (PROTONIX) 40 MG tablet, TAKE 1 TABLET BY MOUTH EVERY DAY, Disp: 30 tablet, Rfl: 5 .  polyethylene glycol powder (GLYCOLAX/MIRALAX) powder, Take 17 g by mouth 2 (two) times daily as needed., Disp: 3350 g, Rfl: 1 .  Pyridoxine HCl (VITAMIN B-6) 250 MG tablet, Take 1 tablet (250 mg total) by mouth daily., Disp: 30 tablet, Rfl: 12 .  TURMERIC PO, Take by mouth daily. , Disp: , Rfl:  .  vitamin E 200 UNIT capsule, Take 200 Units by mouth daily.  , Disp: , Rfl:  .  Vortioxetine HBr (TRINTELLIX) 5 MG TABS, Take 1 tablet (5 mg total) by mouth daily., Disp: 30 tablet, Rfl: 3 No current facility-administered medications for this visit.  Facility-Administered Medications Ordered in Other Visits:  .  sodium chloride flush (NS) 0.9 % injection 10 mL, 10 mL, Intravenous, PRN, Volanda Napoleon, MD, 10 mL at 01/18/16 [id number]  Allergies:  Allergies  Allergen Reactions  . Aspirin     Upset stomach  . Cymbalta [Duloxetine Hcl] Swelling    Swollen lips   . Prozac [Fluoxetine Hcl] Swelling    Lip swelling  . Wellbutrin [Bupropion] Swelling    Lip swelling    Past Medical History, Surgical history, Social history, and Family History  were reviewed and updated.  Review of Systems: As above  Physical Exam:  height is 5\' 9"  (1.753 m) and weight is 154 lb (69.854 kg). Her oral temperature is 97.6 F (36.4 C). Her blood pressure is 132/82 and her pulse is 72. Her respiration is 18.   Head and neck exam shows no ocular or oral lesions. She has no adenopathy in the neck. There is no scleral icterus. Lungs are clear. There are no rales, wheezes or rhonchi are noted. Cardiac exam regular rate and rhythm with no murmurs rubs or bruits. Abdomen is soft. She has good bowel sounds. There is no fluid wave. There is no palpable liver or spleen tip. She has well-healed laparoscopy scar. Back exam shows no tenderness over  the spine, ribs or hips. Extremities shows no clubbing cyanosis or edema. Neurological exam shows no focal neurological deficits. Skin exam no rashes, ecchymoses or petechia.  Lab Results  Component Value Date   WBC 9.4 01/18/2016   HGB 13.9 01/18/2016   HCT 41.6 01/18/2016   MCV 70* 01/18/2016   PLT 159 01/18/2016     Chemistry      Component Value Date/Time   NA 141 01/18/2016 0749   NA 142 09/21/2015 1306   NA 138 03/23/2015 1003   K 3.9 01/18/2016 0749   K 3.8 09/21/2015 1306   K 3.8 03/23/2015 1003   CL 107 01/18/2016 0749   CL 105 03/23/2015 1003   CO2 28 01/18/2016 0749   CO2 27 09/21/2015 1306   CO2 26 03/23/2015 1003   BUN 13 01/18/2016 0749   BUN 9.8 09/21/2015 1306   BUN 12 03/23/2015 1003   CREATININE 0.9 01/18/2016 0749   CREATININE 0.8 09/21/2015 1306   CREATININE 0.86 03/23/2015 1003      Component Value Date/Time   CALCIUM 8.5 01/18/2016 0749   CALCIUM 9.2 09/21/2015 1306   CALCIUM 8.8 03/23/2015 1003   ALKPHOS 63 01/18/2016 0749   ALKPHOS 77 09/21/2015 1306   ALKPHOS 66 03/23/2015 1003   AST 24 01/18/2016 0749   AST 15 09/21/2015 1306   AST 15 03/23/2015 1003   ALT 23 01/18/2016 0749   ALT 13 09/21/2015 1306   ALT 11 03/23/2015 1003   BILITOT 0.70 01/18/2016 0749   BILITOT 0.40 09/21/2015 1306   BILITOT 0.4 03/23/2015 1003         Impression and Plan: [name] is 60 year old female with stage IIIB colon cancer. This was of the cecum. She underwent resection back in September of 2012. She had 4 positive lymph nodes. She completed chemotherapy in March of 2013.  I'm glad that her CT scan did not show any evidence of pulmonary disease. She is still smoking. I suppose that she is at risk for lung cancer.  I think we've probably get her Port-A-Cath out now. I'll call her surgeon to see back in this done.  We get her back in 6 months. I think this would be very reasonable.  She is still working. She probably will will work for another 3 years  before she retires.   Volanda Napoleon, MD 3/6/20179:46 AM

## 2016-01-18 NOTE — Patient Instructions (Signed)

## 2016-01-18 NOTE — Addendum Note (Signed)
Addended by: Burney Gauze R on: 01/18/2016 09:54 AM   Modules accepted: Orders, Medications

## 2016-01-18 NOTE — Addendum Note (Signed)
Addended by: Burney Gauze R on: 01/18/2016 09:57 AM   Modules accepted: Orders

## 2016-01-19 LAB — CEA (PARALLEL TESTING): CEA: 2.6 ng/mL — ABNORMAL HIGH

## 2016-01-19 LAB — CEA: CEA: 4.2 ng/mL (ref 0.0–4.7)

## 2016-01-21 ENCOUNTER — Telehealth: Payer: Self-pay | Admitting: Pulmonary Disease

## 2016-01-21 ENCOUNTER — Other Ambulatory Visit: Payer: Self-pay | Admitting: Family

## 2016-01-21 MED ORDER — VORTIOXETINE HBR 5 MG PO TABS: 1.0000 | ORAL_TABLET | Freq: Every day | ORAL | Status: DC

## 2016-01-21 MED ORDER — FLUTICASONE FUROATE-VILANTEROL 100-25 MCG/INH IN AEPB: INHALATION_SPRAY | RESPIRATORY_TRACT | Status: DC

## 2016-01-21 MED FILL — BREO ELLIPTA 100-25 MCG INH: 100-25 | 30 days supply | Qty: 60 | Fill #0

## 2016-01-21 NOTE — Telephone Encounter (Signed)
Caller name:Teresa Morgan Relationship to patient:self Can be reached:[phone] Pharmacy:  Reason for call:She needs written rx for Trintellix  [facility]

## 2016-01-21 NOTE — Telephone Encounter (Signed)
Breo refilled  LMOM for the pt to be made aware

## 2016-01-21 NOTE — Telephone Encounter (Signed)
Please inform Pt that Rx has been placed at front desk for pick up at her convenience. Thank you.  

## 2016-01-21 NOTE — Telephone Encounter (Addendum)
Rx printed, awaiting NP signature.  

## 2016-01-26 ENCOUNTER — Other Ambulatory Visit: Payer: Self-pay | Admitting: Hematology & Oncology

## 2016-01-26 MED FILL — TRINTELLIX 5 MG TABLET: 5 | 30 days supply | Qty: 30 | Fill #0

## 2016-01-26 MED FILL — GABAPENTIN 600 MG TABLET: 600 | 30 days supply | Qty: 120 | Fill #0

## 2016-02-02 ENCOUNTER — Ambulatory Visit (INDEPENDENT_AMBULATORY_CARE_PROVIDER_SITE_OTHER): Payer: 59 | Admitting: Adult Health

## 2016-02-02 ENCOUNTER — Encounter: Payer: Self-pay | Admitting: Adult Health

## 2016-02-02 VITALS — BP 110/78 | HR 77 | Temp 97.8°F | Ht 68.0 in | Wt 156.0 lb

## 2016-02-02 DIAGNOSIS — J439 Emphysema, unspecified: Secondary | ICD-10-CM

## 2016-02-02 NOTE — Assessment & Plan Note (Signed)
Compensated on present regimen  FMLA papers done   Plan  Continue on BREO daily , rinse after use.  Work on not smoking  Follow up Dr. Elsworth Soho  In 6 months and As needed

## 2016-02-02 NOTE — Progress Notes (Signed)
Subjective:    Patient ID: Teresa Morgan, female    DOB: 1956/06/26, 60 y.o.   MRN: [id number]  HPI 60 yo female smoker with GOLD B COPD/Emphysema.  Hx of Cecum adenocarcinoma Stage III B f/by Dr. Martha Clan.  S/p resection and chemo 2012-2013  02/02/2016 Follow up : COPD  Pt returns for follow up for COPD  She says overall she is doing okay on BREO .  Gets winded some with activity.  Denies chest pain, orthopnea, edema, fever or n/v/d.  Has restarted smoking , discussed cessation.  Mom passed away recently , under a lot of stress.  Needs FMLA papers completed.   Follows with Dr. Marin Olp for colon cancer. S/p rsx in 2012 and chemo.  CT chest in 2015 with tiny nodules .  CT chest in 01/2016 with stable right lung nodules , additional nodules improved or resolved.         Past Medical History  Diagnosis Date  . Anemia   . Anxiety   . Arthritis   . Depression   . GERD (gastroesophageal reflux disease)   . Peptic ulcer   . Colon cancer ([place])   . COPD with emphysema ([place]) 06/18/2012   Current Outpatient Prescriptions on File Prior to Visit  Medication Sig Dispense Refill  . ALPRAZolam (XANAX) 1 MG tablet Take 1 tablet (1 mg total) by mouth at bedtime as needed. 30 tablet 0  . Calcium-Vitamin D 600-125 MG-UNIT TABS Take by mouth daily.      . fluticasone furoate-vilanterol (BREO ELLIPTA) 100-25 MCG/INH AEPB INHALE 1 PUFF BY MOUTH EVERY DAY 60 each 11  . gabapentin (NEURONTIN) 600 MG tablet TAKE 1 TABLET (600 MG TOTAL) BY MOUTH 4 (FOUR) TIMES DAILY. 120 tablet 3  . LORazepam (ATIVAN) 0.5 MG tablet One tablet by mouth once daily in the AM as needed for anxiety 30 tablet 0  . Multiple Vitamin (MULTIVITAMIN) tablet Take 1 tablet by mouth daily.      Marland Kitchen oxyCODONE-acetaminophen (PERCOCET) 10-325 MG tablet 1 - 2 tabs every 4 hours as needed for pain 120 tablet 0  . pantoprazole (PROTONIX) 40 MG tablet TAKE 1 TABLET BY MOUTH EVERY DAY 30 tablet 5  . polyethylene glycol powder  (GLYCOLAX/MIRALAX) powder Take 17 g by mouth 2 (two) times daily as needed. 3350 g 1  . Pyridoxine HCl (VITAMIN B-6) 250 MG tablet Take 1 tablet (250 mg total) by mouth daily. 30 tablet 12  . TURMERIC PO Take by mouth daily.     . vitamin E 200 UNIT capsule Take 200 Units by mouth daily.      . Vortioxetine HBr (TRINTELLIX) 5 MG TABS Take 1 tablet (5 mg total) by mouth daily. 30 tablet 3   No current facility-administered medications on file prior to visit.      Review of Systems Constitutional:   No  weight loss, night sweats,  Fevers, chills, fatigue, or  lassitude.  HEENT:   No headaches,  Difficulty swallowing,  Tooth/dental problems, or  Sore throat,                No sneezing, itching, ear ache, nasal congestion, post nasal drip,   CV:  No chest pain,  Orthopnea, PND, swelling in lower extremities, anasarca, dizziness, palpitations, syncope.   GI  No heartburn, indigestion, abdominal pain, nausea, vomiting, diarrhea, change in bowel habits, loss of appetite, bloody stools.   Resp:    No chest wall deformity  Skin: no rash or lesions.  GU: no dysuria, change in color of urine, no urgency or frequency.  No flank pain, no hematuria   MS:  No joint pain or swelling.  No decreased range of motion.  No back pain.  Psych:  No change in mood or affect. No depression or anxiety.  No memory loss.         Objective:   Physical Exam  Filed Vitals:   02/02/16 1203  BP: 110/78  Pulse: 77  Temp: 97.8 F (36.6 C)  TempSrc: Oral  Height: 5\' 8"  (1.727 m)  Weight: 156 lb (70.761 kg)  SpO2: 96%    GEN: A/Ox3; pleasant , NAD   HEENT:  [place]/AT,  EACs-clear, TMs-wnl, NOSE-clear, THROAT-clear, no lesions, no postnasal drip or exudate noted.   NECK:  Supple w/ fair ROM; no JVD; normal carotid impulses w/o bruits; no thyromegaly or nodules palpated; no lymphadenopathy.  RESP  Decreased BS in bases no accessory muscle use, no dullness to percussion  CARD:  RRR, no m/r/g  , no  peripheral edema, pulses intact, no cyanosis or clubbing.  GI:   Soft & nt; nml bowel sounds; no organomegaly or masses detected.  Musco: Warm bil, no deformities or joint swelling noted.   Neuro: alert, no focal deficits noted.    Skin: Warm, no lesions or rashes   Tammy Parrett NP-C   Pulmonary and Critical Care  02/02/16     Assessment & Plan:

## 2016-02-02 NOTE — Patient Instructions (Addendum)
Continue on BREO daily , rinse after use.  Work on not smoking  Follow up Dr. Elsworth Soho  In 6 months and As needed

## 2016-02-03 ENCOUNTER — Other Ambulatory Visit: Payer: Self-pay | Admitting: General Surgery

## 2016-02-09 NOTE — Progress Notes (Signed)
Reviewed & agree with plan  

## 2016-02-18 ENCOUNTER — Other Ambulatory Visit: Payer: Self-pay | Admitting: *Deleted

## 2016-02-18 DIAGNOSIS — J430 Unilateral pulmonary emphysema [MacLeod's syndrome]: Secondary | ICD-10-CM

## 2016-02-18 DIAGNOSIS — C180 Malignant neoplasm of cecum: Secondary | ICD-10-CM

## 2016-02-18 DIAGNOSIS — G629 Polyneuropathy, unspecified: Secondary | ICD-10-CM

## 2016-02-18 DIAGNOSIS — G4700 Insomnia, unspecified: Secondary | ICD-10-CM

## 2016-02-18 MED ORDER — OXYCODONE-ACETAMINOPHEN 10-325 MG PO TABS: ORAL_TABLET | ORAL | Status: DC

## 2016-02-19 ENCOUNTER — Other Ambulatory Visit: Payer: Self-pay | Admitting: *Deleted

## 2016-02-19 DIAGNOSIS — C180 Malignant neoplasm of cecum: Secondary | ICD-10-CM

## 2016-02-19 DIAGNOSIS — G4700 Insomnia, unspecified: Secondary | ICD-10-CM

## 2016-02-19 DIAGNOSIS — J430 Unilateral pulmonary emphysema [MacLeod's syndrome]: Secondary | ICD-10-CM

## 2016-02-19 DIAGNOSIS — G629 Polyneuropathy, unspecified: Secondary | ICD-10-CM

## 2016-02-19 MED ORDER — ALPRAZOLAM 1 MG PO TABS: 1.0000 mg | ORAL_TABLET | Freq: Every evening | ORAL | Status: DC | PRN

## 2016-02-23 MED FILL — ALPRAZolam 1 MG TABS: 1 | 30 days supply | Qty: 30 | Fill #0

## 2016-03-15 ENCOUNTER — Other Ambulatory Visit: Payer: Self-pay | Admitting: *Deleted

## 2016-03-15 DIAGNOSIS — G629 Polyneuropathy, unspecified: Secondary | ICD-10-CM

## 2016-03-15 DIAGNOSIS — C180 Malignant neoplasm of cecum: Secondary | ICD-10-CM

## 2016-03-15 DIAGNOSIS — G4700 Insomnia, unspecified: Secondary | ICD-10-CM

## 2016-03-15 DIAGNOSIS — J430 Unilateral pulmonary emphysema [MacLeod's syndrome]: Secondary | ICD-10-CM

## 2016-03-15 MED ORDER — OXYCODONE-ACETAMINOPHEN 10-325 MG PO TABS: ORAL_TABLET | ORAL | Status: DC

## 2016-03-15 MED ORDER — ALPRAZOLAM 1 MG PO TABS: 1.0000 mg | ORAL_TABLET | Freq: Every evening | ORAL | Status: DC | PRN

## 2016-03-18 MED FILL — ALPRAZolam 1 MG TABS: 1 | 30 days supply | Qty: 30 | Fill #0

## 2016-03-18 MED FILL — OXYCODONE-APAP 10-325 TAB: 10-325 | 10 days supply | Qty: 120 | Fill #0

## 2016-03-28 ENCOUNTER — Telehealth: Payer: Self-pay | Admitting: Adult Health

## 2016-03-28 NOTE — Telephone Encounter (Signed)
LM for pt x 1  

## 2016-03-29 MED ORDER — FLUTICASONE FUROATE-VILANTEROL 100-25 MCG/INH IN AEPB: INHALATION_SPRAY | RESPIRATORY_TRACT | Status: DC

## 2016-03-29 NOTE — Telephone Encounter (Signed)
Spoke with pt, breo refill sent to preferred pharmacy.  Nothing further needed.

## 2016-04-12 ENCOUNTER — Encounter (HOSPITAL_COMMUNITY)
Admission: RE | Admit: 2016-04-12 | Discharge: 2016-04-12 | Disposition: A | Discharge status: Home / Self Care | Payer: 59 | Source: Ambulatory Visit | Attending: General Surgery | Admitting: General Surgery

## 2016-04-12 ENCOUNTER — Other Ambulatory Visit (HOSPITAL_COMMUNITY): Payer: Self-pay | Admitting: *Deleted

## 2016-04-12 ENCOUNTER — Encounter (HOSPITAL_COMMUNITY): Payer: Self-pay

## 2016-04-12 DIAGNOSIS — C189 Malignant neoplasm of colon, unspecified: Secondary | ICD-10-CM | POA: Insufficient documentation

## 2016-04-12 DIAGNOSIS — Z01812 Encounter for preprocedural laboratory examination: Secondary | ICD-10-CM | POA: Diagnosis not present

## 2016-04-12 HISTORY — DX: Reserved for inherently not codable concepts without codable children: IMO0001

## 2016-04-12 HISTORY — DX: Pneumonia, unspecified organism: J18.9

## 2016-04-12 LAB — BASIC METABOLIC PANEL
ANION GAP: 7 (ref 5–15)
BUN: 7 mg/dL (ref 6–20)
CHLORIDE: 107 mmol/L (ref 101–111)
CO2: 27 mmol/L (ref 22–32)
Calcium: 9.2 mg/dL (ref 8.9–10.3)
Creatinine, Ser: 0.84 mg/dL (ref 0.44–1.00)
Glucose, Bld: 90 mg/dL (ref 65–99)
POTASSIUM: 3.6 mmol/L (ref 3.5–5.1)
SODIUM: 141 mmol/L (ref 135–145)

## 2016-04-12 LAB — CBC
HEMATOCRIT: 43.6 % (ref 36.0–46.0)
HEMOGLOBIN: 14.2 g/dL (ref 12.0–15.0)
MCH: 23.4 pg — ABNORMAL LOW (ref 26.0–34.0)
MCHC: 32.6 g/dL (ref 30.0–36.0)
MCV: 71.7 fL — AB (ref 78.0–100.0)
Platelets: 143 10*3/uL — ABNORMAL LOW (ref 150–400)
RBC: 6.08 MIL/uL — AB (ref 3.87–5.11)
RDW: 15.4 % (ref 11.5–15.5)
WBC: 6.1 10*3/uL (ref 4.0–10.5)

## 2016-04-12 NOTE — Progress Notes (Signed)
Pt denies cardiac history, chest pain or sob. 

## 2016-04-12 NOTE — Pre-Procedure Instructions (Signed)
Teresa Morgan  04/12/2016     Your procedure is scheduled on Tuesday, April 19, 2016 at 9:20 AM.   Report to [facility] Entrance "A" Admitting Office at 7:20 AM.   Call this number if you have problems the morning of surgery: [phone]   Any questions prior to day of surgery, please call [phone] between 8 & 4 PM.   Remember:  Do not eat food or drink liquids after midnight Monday, 04/18/16.  Take these medicines the morning of surgery with A SIP OF WATER: Gabapentin (Neurontin), Pantoprazole (Protonix), Vortioxetime (Trintellix), Breo Ellipta inhaler, Lorazepam (Ativan) - if needed, Oxycodone - if needed  Stop Herbal Medications, Multivitamins and Vitamin E as of today. Do not use Aspirin products or NSAIDS (Ibuprofen, Aleve, etc.) prior to surgery as of today.   Do not wear jewelry, make-up or nail polish.  Do not wear lotions, powders, or perfumes.  You may wear deodorant.  Do not shave 48 hours prior to surgery.    Do not bring valuables to the hospital.  [facility] is not responsible for any belongings or valuables.  Contacts, dentures or bridgework may not be worn into surgery.  Leave your suitcase in the car.  After surgery it may be brought to your room.  For patients admitted to the hospital, discharge time will be determined by your treatment team.  Patients discharged the day of surgery will not be allowed to drive home.   Special instructions:  [facility] - Preparing for Surgery  Before surgery, you can play an important role.  Because skin is not sterile, your skin needs to be as free of germs as possible.  You can reduce the number of germs on you skin by washing with CHG (chlorahexidine gluconate) soap before surgery.  CHG is an antiseptic cleaner which kills germs and bonds with the skin to continue killing germs even after washing.  Please DO NOT use if you have an allergy to CHG or antibacterial soaps.  If your skin becomes reddened/irritated stop  using the CHG and inform your nurse when you arrive at Short Stay.  Do not shave (including legs and underarms) for at least 48 hours prior to the first CHG shower.  You may shave your face.  Please follow these instructions carefully:   1.  Shower with CHG Soap the night before surgery and the                                morning of Surgery.  2.  If you choose to wash your hair, wash your hair first as usual with your       normal shampoo.  3.  After you shampoo, rinse your hair and body thoroughly to remove the                      Shampoo.  4.  Use CHG as you would any other liquid soap.  You can apply chg directly       to the skin and wash gently with scrungie or a clean washcloth.  5.  Apply the CHG Soap to your body ONLY FROM THE NECK DOWN.        Do not use on open wounds or open sores.  Avoid contact with your eyes, ears, mouth and genitals (private parts).  Wash genitals (private parts) with your normal soap.  6.  Wash  thoroughly, paying special attention to the area where your surgery        will be performed.  7.  Thoroughly rinse your body with warm water from the neck down.  8.  DO NOT shower/wash with your normal soap after using and rinsing off       the CHG Soap.  9.  Pat yourself dry with a clean towel.            10.  Wear clean pajamas.            11.  Place clean sheets on your bed the night of your first shower and do not        sleep with pets.  Day of Surgery  Do not apply any lotions the morning of surgery.  Please wear clean clothes to the hospital.   Please read over the following fact sheets that you were given. Pain Booklet, Coughing and Deep Breathing and Surgical Site Infection Prevention

## 2016-04-18 MED ORDER — CEFAZOLIN SODIUM-DEXTROSE 2-4 GM/100ML-% IV SOLN
2.0000 g | INTRAVENOUS | Status: AC
Start: 2016-04-19 — End: 2016-04-19
  Administered 2016-04-19: 2 g via INTRAVENOUS
  Filled 2016-04-18: qty 100

## 2016-04-19 ENCOUNTER — Ambulatory Visit (HOSPITAL_COMMUNITY)
Admission: RE | Admit: 2016-04-19 | Discharge: 2016-04-19 | Disposition: A | Discharge status: Home / Self Care | Payer: 59 | Source: Ambulatory Visit | Attending: General Surgery | Admitting: General Surgery

## 2016-04-19 ENCOUNTER — Ambulatory Visit (HOSPITAL_COMMUNITY): Payer: 59 | Admitting: Certified Registered Nurse Anesthetist

## 2016-04-19 ENCOUNTER — Encounter (HOSPITAL_COMMUNITY): Payer: Self-pay | Admitting: Certified Registered Nurse Anesthetist

## 2016-04-19 ENCOUNTER — Encounter (HOSPITAL_COMMUNITY)
Admission: RE | Disposition: A | Discharge status: Home / Self Care | Payer: Self-pay | Source: Ambulatory Visit | Attending: General Surgery

## 2016-04-19 DIAGNOSIS — Z8589 Personal history of malignant neoplasm of other organs and systems: Secondary | ICD-10-CM | POA: Diagnosis not present

## 2016-04-19 DIAGNOSIS — J449 Chronic obstructive pulmonary disease, unspecified: Secondary | ICD-10-CM | POA: Diagnosis not present

## 2016-04-19 DIAGNOSIS — Z452 Encounter for adjustment and management of vascular access device: Secondary | ICD-10-CM | POA: Diagnosis not present

## 2016-04-19 DIAGNOSIS — Z85038 Personal history of other malignant neoplasm of large intestine: Secondary | ICD-10-CM | POA: Diagnosis not present

## 2016-04-19 DIAGNOSIS — C180 Malignant neoplasm of cecum: Secondary | ICD-10-CM

## 2016-04-19 DIAGNOSIS — F418 Other specified anxiety disorders: Secondary | ICD-10-CM | POA: Diagnosis not present

## 2016-04-19 DIAGNOSIS — G4700 Insomnia, unspecified: Secondary | ICD-10-CM

## 2016-04-19 DIAGNOSIS — Z9221 Personal history of antineoplastic chemotherapy: Secondary | ICD-10-CM | POA: Diagnosis not present

## 2016-04-19 DIAGNOSIS — F17210 Nicotine dependence, cigarettes, uncomplicated: Secondary | ICD-10-CM | POA: Insufficient documentation

## 2016-04-19 DIAGNOSIS — K219 Gastro-esophageal reflux disease without esophagitis: Secondary | ICD-10-CM | POA: Diagnosis not present

## 2016-04-19 DIAGNOSIS — J430 Unilateral pulmonary emphysema [MacLeod's syndrome]: Secondary | ICD-10-CM

## 2016-04-19 DIAGNOSIS — G629 Polyneuropathy, unspecified: Secondary | ICD-10-CM

## 2016-04-19 HISTORY — PX: PORT-A-CATH REMOVAL: SHX5289

## 2016-04-19 SURGERY — REMOVAL PORT-A-CATH
Anesthesia: Monitor Anesthesia Care | Site: Chest | Laterality: Left

## 2016-04-19 MED ORDER — LACTATED RINGERS IV SOLN: INTRAVENOUS | Status: DC

## 2016-04-19 MED ORDER — FENTANYL CITRATE (PF) 250 MCG/5ML IJ SOLN
INTRAMUSCULAR | Status: AC
  Filled 2016-04-19: qty 5

## 2016-04-19 MED ORDER — OXYCODONE-ACETAMINOPHEN 10-325 MG PO TABS: ORAL_TABLET | ORAL | Status: DC

## 2016-04-19 MED ORDER — PROPOFOL 500 MG/50ML IV EMUL
INTRAVENOUS | Status: DC | PRN
  Administered 2016-04-19: 100 ug/kg/min via INTRAVENOUS

## 2016-04-19 MED ORDER — PROPOFOL 10 MG/ML IV BOLUS
INTRAVENOUS | Status: DC | PRN
  Administered 2016-04-19: 10 mg via INTRAVENOUS
  Administered 2016-04-19: 20 mg via INTRAVENOUS

## 2016-04-19 MED ORDER — LIDOCAINE HCL (PF) 1 % IJ SOLN
INTRAMUSCULAR | Status: AC
  Filled 2016-04-19: qty 30

## 2016-04-19 MED ORDER — LACTATED RINGERS IV SOLN
INTRAVENOUS | Status: DC | PRN
  Administered 2016-04-19: 09:00:00 via INTRAVENOUS

## 2016-04-19 MED ORDER — MIDAZOLAM HCL 5 MG/5ML IJ SOLN
INTRAMUSCULAR | Status: DC | PRN
  Administered 2016-04-19 (×2): 1 mg via INTRAVENOUS

## 2016-04-19 MED ORDER — FENTANYL CITRATE (PF) 100 MCG/2ML IJ SOLN: 25.0000 ug | INTRAMUSCULAR | Status: DC | PRN

## 2016-04-19 MED ORDER — MIDAZOLAM HCL 2 MG/2ML IJ SOLN
INTRAMUSCULAR | Status: AC
  Filled 2016-04-19: qty 2

## 2016-04-19 MED ORDER — LIDOCAINE HCL 1 % IJ SOLN
INTRAMUSCULAR | Status: DC | PRN
  Administered 2016-04-19: 10 mL via INTRAMUSCULAR

## 2016-04-19 MED ORDER — BUPIVACAINE-EPINEPHRINE (PF) 0.25% -1:200000 IJ SOLN
INTRAMUSCULAR | Status: AC
  Filled 2016-04-19: qty 30

## 2016-04-19 MED ORDER — 0.9 % SODIUM CHLORIDE (POUR BTL) OPTIME
TOPICAL | Status: DC | PRN
  Administered 2016-04-19: 1000 mL

## 2016-04-19 MED ORDER — FENTANYL CITRATE (PF) 100 MCG/2ML IJ SOLN
INTRAMUSCULAR | Status: DC | PRN
  Administered 2016-04-19: 50 ug via INTRAVENOUS
  Administered 2016-04-19: 25 ug via INTRAVENOUS

## 2016-04-19 SURGICAL SUPPLY — 31 items
BLADE SURG 15 STRL LF DISP TIS (BLADE) ×1 IMPLANT
BLADE SURG 15 STRL SS (BLADE) ×3
CHLORAPREP W/TINT 10.5 ML (MISCELLANEOUS) ×3 IMPLANT
COVER SURGICAL LIGHT HANDLE (MISCELLANEOUS) ×3 IMPLANT
DECANTER SPIKE VIAL GLASS SM (MISCELLANEOUS) ×6 IMPLANT
DRAPE LAPAROTOMY T 98X78 PEDS (DRAPES) ×3 IMPLANT
DRAPE UTILITY XL STRL (DRAPES) ×6 IMPLANT
ELECT CAUTERY BLADE 6.4 (BLADE) ×3 IMPLANT
ELECT REM PT RETURN 9FT ADLT (ELECTROSURGICAL) ×3
ELECTRODE REM PT RTRN 9FT ADLT (ELECTROSURGICAL) ×1 IMPLANT
GAUZE SPONGE 4X4 16PLY XRAY LF (GAUZE/BANDAGES/DRESSINGS) ×3 IMPLANT
GLOVE BIO SURGEON STRL SZ 6 (GLOVE) ×3 IMPLANT
GLOVE BIOGEL PI IND STRL 6.5 (GLOVE) ×1 IMPLANT
GLOVE BIOGEL PI INDICATOR 6.5 (GLOVE) ×2
GOWN STRL REUS W/ TWL LRG LVL3 (GOWN DISPOSABLE) ×1 IMPLANT
GOWN STRL REUS W/TWL 2XL LVL3 (GOWN DISPOSABLE) ×3 IMPLANT
GOWN STRL REUS W/TWL LRG LVL3 (GOWN DISPOSABLE) ×3
KIT BASIN OR (CUSTOM PROCEDURE TRAY) ×3 IMPLANT
KIT ROOM TURNOVER OR (KITS) ×3 IMPLANT
LIQUID BAND (GAUZE/BANDAGES/DRESSINGS) ×3 IMPLANT
NEEDLE HYPO 25GX1X1/2 BEV (NEEDLE) ×3 IMPLANT
NS IRRIG 1000ML POUR BTL (IV SOLUTION) ×3 IMPLANT
PACK SURGICAL SETUP 50X90 (CUSTOM PROCEDURE TRAY) ×3 IMPLANT
PAD ARMBOARD 7.5X6 YLW CONV (MISCELLANEOUS) ×6 IMPLANT
PENCIL BUTTON HOLSTER BLD 10FT (ELECTRODE) ×3 IMPLANT
SUT MON AB 4-0 PC3 18 (SUTURE) ×3 IMPLANT
SUT VIC AB 3-0 SH 27 (SUTURE) ×3
SUT VIC AB 3-0 SH 27X BRD (SUTURE) ×1 IMPLANT
SYR CONTROL 10ML LL (SYRINGE) ×3 IMPLANT
TOWEL OR 17X24 6PK STRL BLUE (TOWEL DISPOSABLE) ×3 IMPLANT
TOWEL OR 17X26 10 PK STRL BLUE (TOWEL DISPOSABLE) ×3 IMPLANT

## 2016-04-19 NOTE — H&P (Signed)
Teresa Morgan is an 60 y.o. female.   Chief Complaint: history of colon cancer HPI: Pt is s/p lap right colectomy for cecal cancer in 2012.  She received chemotherapy.  She has had follow up scans, blood work, and colonoscopy without signs of recurrence.  She desires port removal.    Past Medical History  Diagnosis Date  . Anemia   . Anxiety   . Arthritis   . Depression   . GERD (gastroesophageal reflux disease)   . Peptic ulcer   . Colon cancer ([place])   . COPD with emphysema ([place]) 06/18/2012  . Shortness of breath dyspnea     with exertion  . Pneumonia     Past Surgical History  Procedure Laterality Date  . Heel spurs Right   . Hemrrhoids    . Plantar fascia surgery Right 2009  . Colon resection  2012    for colon cancer  . Tubal ligation  07/1980  . Appendectomy  07/07/11  . Portacath    . Eye surgery Bilateral     cataract surgery with lens implant  . Colonoscopy      Family History  Problem Relation Age of Onset  . Diabetes Mother   . Colon cancer Cousin    Social History:  reports that she has been smoking Cigarettes.  She started smoking about 30 years ago. She has a 20 pack-year smoking history. She has never used smokeless tobacco. She reports that she drinks alcohol. She reports that she does not use illicit drugs.  Allergies:  Allergies  Allergen Reactions  . Aspirin     Upset stomach  . Cymbalta [Duloxetine Hcl] Swelling    Swollen lips   . Prozac [Fluoxetine Hcl] Swelling    Lip swelling  . Wellbutrin [Bupropion] Swelling    Lip swelling    Medications Prior to Admission  Medication Sig Dispense Refill  . ALPRAZolam (XANAX) 1 MG tablet Take 1 tablet (1 mg total) by mouth at bedtime as needed. (Patient taking differently: Take 1 mg by mouth at bedtime as needed for anxiety or sleep. ) 30 tablet 0  . Calcium-Vitamin D 600-125 MG-UNIT TABS Take 1 tablet by mouth daily.     . fluticasone furoate-vilanterol (BREO ELLIPTA) 100-25 MCG/INH AEPB INHALE 1 PUFF  BY MOUTH EVERY DAY 60 each 11  . gabapentin (NEURONTIN) 600 MG tablet TAKE 1 TABLET (600 MG TOTAL) BY MOUTH 4 (FOUR) TIMES DAILY. 120 tablet 3  . Multiple Vitamin (MULTIVITAMIN) tablet Take 1 tablet by mouth daily.      Marland Kitchen oxyCODONE-acetaminophen (PERCOCET) 10-325 MG tablet 1 - 2 tabs every 4 hours as needed for pain (Patient taking differently: Take 1-2 tablets by mouth every 4 (four) hours as needed for pain. ) 120 tablet 0  . pantoprazole (PROTONIX) 40 MG tablet TAKE 1 TABLET BY MOUTH EVERY DAY 30 tablet 5  . Pyridoxine HCl (VITAMIN B-6) 250 MG tablet Take 1 tablet (250 mg total) by mouth daily. 30 tablet 12  . TURMERIC PO Take 1 tablet by mouth daily.     . vitamin E 200 UNIT capsule Take 200 Units by mouth daily.      . Vortioxetine HBr (TRINTELLIX) 5 MG TABS Take 1 tablet (5 mg total) by mouth daily. 30 tablet 3  . LORazepam (ATIVAN) 0.5 MG tablet One tablet by mouth once daily in the AM as needed for anxiety (Patient taking differently: Take 0.5 mg by mouth daily as needed for anxiety. ) 30 tablet 0  .  polyethylene glycol powder (GLYCOLAX/MIRALAX) powder Take 17 g by mouth 2 (two) times daily as needed. (Patient taking differently: Take 17 g by mouth 2 (two) times daily as needed for moderate constipation. ) 3350 g 1    No results found for this or any previous visit (from the past 48 hour(s)). No results found.  Review of Systems  All other systems reviewed and are negative.   Blood pressure 139/70, pulse 75, temperature 98.2 F (36.8 C), temperature source Oral, resp. rate 20, height 5\' 8"  (1.727 m), weight 70.308 kg (155 lb), SpO2 98 %. Physical Exam  Constitutional: She is oriented to person, place, and time. She appears well-developed and well-nourished. No distress.  HENT:  Head: Normocephalic and atraumatic.  Eyes: Conjunctivae are normal. Pupils are equal, round, and reactive to light. No scleral icterus.  Neck: Normal range of motion. Neck supple.  Cardiovascular: Normal  rate.   Respiratory: Effort normal. No respiratory distress.  Left subclavian port in place.    GI: Soft.  Musculoskeletal: Normal range of motion.  Neurological: She is alert and oriented to person, place, and time.  Skin: Skin is warm and dry. She is not diaphoretic.  Psychiatric: She has a normal mood and affect. Her behavior is normal. Judgment and thought content normal.     Assessment/Plan History of colon cancer  Plan removal of port a cath. Reviewed risks, benefits, post op course.    [name], MD 04/19/2016, 9:18 AM

## 2016-04-19 NOTE — Discharge Instructions (Signed)
[facility] Phone Number [phone]   POST OP INSTRUCTIONS  Always review your discharge instruction sheet given to you by the facility where your surgery was performed.  IF YOU HAVE DISABILITY OR FAMILY LEAVE FORMS, YOU MUST BRING THEM TO THE OFFICE FOR PROCESSING.  DO NOT GIVE THEM TO YOUR DOCTOR.  1. A prescription for pain medication may be given to you upon discharge.  Take your pain medication as prescribed, if needed.  If narcotic pain medicine is not needed, then you may take acetaminophen (Tylenol) or ibuprofen (Advil) as needed. 2. Take your usually prescribed medications unless otherwise directed 3. If you need a refill on your pain medication, please contact your pharmacy.  They will contact our office to request authorization.  Prescriptions will not be filled after 5pm or on week-ends. 4. You should eat very light the first 24 hours after surgery, such as soup, crackers, pudding, etc.  Resume your normal diet the day after surgery 5. It is common to experience some constipation if taking pain medication after surgery.  Increasing fluid intake and taking a stool softener will usually help or prevent this problem from occurring.  A mild laxative (Milk of Magnesia or Miralax) should be taken according to package directions if there are no bowel movements after 48 hours. 6. You may shower in 48 hours.  The surgical glue will flake off in 2-3 weeks.   7. ACTIVITIES:  No strenuous activity or heavy lifting for 1 week.   a. You may drive when you no longer are taking prescription pain medication, you can comfortably wear a seatbelt, and you can safely maneuver your car and apply brakes. b. RETURN TO WORK:  _________1 week or earlier if no lifting.  _______________ Teresa Morgan should see your doctor in the office for a follow-up appointment approximately three-four weeks after your surgery.    WHEN TO CALL YOUR DOCTOR: 1. Fever over 101.0 2. Nausea and/or  vomiting. 3. Extreme swelling or bruising. 4. Continued bleeding from incision. 5. Increased pain, redness, or drainage from the incision.  The clinic staff is available to answer your questions during regular business hours.  Please dont hesitate to call and ask to speak to one of the nurses for clinical concerns.  If you have a medical emergency, go to the nearest emergency room or call 911.  A surgeon from [facility] is always on call at the hospital.  For further questions, please visit [email or web address]

## 2016-04-19 NOTE — Op Note (Signed)
  PRE-OPERATIVE DIAGNOSIS:  un-needed Port-A-Cath for cecal cancer  POST-OPERATIVE DIAGNOSIS:  Same   PROCEDURE:  Procedure(s):  REMOVAL PORT-A-CATH  SURGEON:  Surgeon(s):  [name], MD  ANESTHESIA:   MAC + local  EBL:   Minimal  SPECIMEN:  None  Complications : none known  Procedure:   Pt was  identified in the holding area and taken to the operating room where she was placed supine on the operating room table.  MAC anesthesia was induced.  The left upper chest was prepped and draped.  The prior incision was anesthetized with local anesthetic.  The incision was opened with a #15 blade.  The subcutaneous tissue was divided with the cautery.  The port was identified and the capsule opened.  The four 2-0 prolene sutures were removed.  The port was then removed and pressure held on the tract.  The catheter appeared intact without evidence of breakage, length was 24 cm.  The wound was inspected for hemostasis, which was achieved with cautery.  The wound was closed with 3-0 vicryl deep dermal interrupted sutures and 4-0 Monocryl running subcuticular suture.  The wound was cleaned, dried, and dressed with dermabond.  The patient was awakened from anesthesia and taken to the PACU in stable condition.  Needle, sponge, and instrument counts are correct.

## 2016-04-19 NOTE — Anesthesia Postprocedure Evaluation (Signed)
Anesthesia Post Note  Patient: Rozanne L Held  Procedure(s) Performed: Procedure(s) (LRB): REMOVAL PORT-A-CATH (Left)  Patient location during evaluation: PACU Anesthesia Type: MAC Level of consciousness: awake and alert Pain management: pain level controlled Vital Signs Assessment: post-procedure vital signs reviewed and stable Respiratory status: spontaneous breathing, nonlabored ventilation, respiratory function stable and patient connected to nasal cannula oxygen Cardiovascular status: blood pressure returned to baseline and stable Postop Assessment: no signs of nausea or vomiting Anesthetic complications: no    Last Vitals:  Filed Vitals:   04/19/16 1048 04/19/16 1100  BP:  109/81  Pulse: 62 65  Temp:    Resp: 13 21    Last Pain: There were no vitals filed for this visit.               Pasco Marchitto L

## 2016-04-19 NOTE — Anesthesia Preprocedure Evaluation (Addendum)
Anesthesia Evaluation  Patient identified by MRN, date of birth, ID band Patient awake    Reviewed: Allergy & Precautions, H&P , NPO status , Patient's Chart, lab work & pertinent test results  Airway Mallampati: II  TM Distance: >3 FB Neck ROM: full    Dental no notable dental hx. (+) Dental Advisory Given, Teeth Intact   Pulmonary shortness of breath and with exertion, COPD, Current Smoker,    Pulmonary exam normal breath sounds clear to auscultation       Cardiovascular Exercise Tolerance: Good negative cardio ROS Normal cardiovascular exam Rhythm:regular Rate:Normal     Neuro/Psych Anxiety Depression neuropathy negative neurological ROS  negative psych ROS   GI/Hepatic negative GI ROS, Neg liver ROS, GERD  Medicated and Controlled,  Endo/Other  negative endocrine ROS  Renal/GU negative Renal ROS  negative genitourinary   Musculoskeletal   Abdominal   Peds  Hematology negative hematology ROS (+)   Anesthesia Other Findings   Reproductive/Obstetrics negative OB ROS                             Anesthesia Physical Anesthesia Plan  ASA: III  Anesthesia Plan: MAC   Post-op Pain Management:    Induction:   Airway Management Planned:   Additional Equipment:   Intra-op Plan:   Post-operative Plan:   Informed Consent: I have reviewed the patients History and Physical, chart, labs and discussed the procedure including the risks, benefits and alternatives for the proposed anesthesia with the patient or authorized representative who has indicated his/her understanding and acceptance.   Dental Advisory Given  Plan Discussed with: CRNA  Anesthesia Plan Comments:         Anesthesia Quick Evaluation

## 2016-04-19 NOTE — Transfer of Care (Signed)
Immediate Anesthesia Transfer of Care Note  Patient: Teresa Morgan  Procedure(s) Performed: Procedure(s): REMOVAL PORT-A-CATH (Left)  Patient Location: PACU  Anesthesia Type:MAC  Level of Consciousness: awake, alert  and oriented  Airway & Oxygen Therapy: Patient Spontanous Breathing  Post-op Assessment: Report given to RN and Post -op Vital signs reviewed and stable  Post vital signs: Reviewed and stable  Last Vitals:  Filed Vitals:   04/19/16 0803  BP: 139/70  Pulse: 75  Temp: 36.8 C  Resp: 20    Last Pain: There were no vitals filed for this visit.       Complications: No apparent anesthesia complications

## 2016-04-20 ENCOUNTER — Other Ambulatory Visit: Payer: Self-pay | Admitting: *Deleted

## 2016-04-20 ENCOUNTER — Telehealth: Payer: Self-pay | Admitting: *Deleted

## 2016-04-20 ENCOUNTER — Encounter (HOSPITAL_COMMUNITY): Payer: Self-pay | Admitting: General Surgery

## 2016-04-20 DIAGNOSIS — G4700 Insomnia, unspecified: Secondary | ICD-10-CM

## 2016-04-20 DIAGNOSIS — J430 Unilateral pulmonary emphysema [MacLeod's syndrome]: Secondary | ICD-10-CM

## 2016-04-20 DIAGNOSIS — G629 Polyneuropathy, unspecified: Secondary | ICD-10-CM

## 2016-04-20 DIAGNOSIS — C180 Malignant neoplasm of cecum: Secondary | ICD-10-CM

## 2016-04-20 MED ORDER — GABAPENTIN 600 MG PO TABS: ORAL_TABLET | ORAL | Status: DC

## 2016-04-20 MED ORDER — ALPRAZOLAM 1 MG PO TABS: 1.0000 mg | ORAL_TABLET | Freq: Every evening | ORAL | Status: DC | PRN

## 2016-04-20 MED FILL — GABAPENTIN 600 MG TABLET: 600 | 30 days supply | Qty: 120 | Fill #0

## 2016-04-20 MED FILL — ALPRAZolam 1 MG TABS: 1 | 30 days supply | Qty: 30 | Fill #0

## 2016-04-20 NOTE — Telephone Encounter (Signed)
Received call from patient asking for medication refill on Percocet..  Noted that patient received 15 from Dr. Barry Dienes yesterday. Left message for patient that we cant refill Percocet for this reason but will refill Xanax and Gabapentin.

## 2016-04-22 ENCOUNTER — Other Ambulatory Visit: Payer: Self-pay | Admitting: *Deleted

## 2016-04-22 DIAGNOSIS — C180 Malignant neoplasm of cecum: Secondary | ICD-10-CM

## 2016-04-22 DIAGNOSIS — G629 Polyneuropathy, unspecified: Secondary | ICD-10-CM

## 2016-04-22 DIAGNOSIS — G4700 Insomnia, unspecified: Secondary | ICD-10-CM

## 2016-04-22 DIAGNOSIS — J430 Unilateral pulmonary emphysema [MacLeod's syndrome]: Secondary | ICD-10-CM

## 2016-04-22 MED ORDER — OXYCODONE-ACETAMINOPHEN 10-325 MG PO TABS: ORAL_TABLET | ORAL | Status: DC

## 2016-04-25 MED FILL — OXYCODONE-APAP 10-325 TAB: 10-325 | 10 days supply | Qty: 120 | Fill #0

## 2016-05-09 ENCOUNTER — Telehealth: Payer: Self-pay | Admitting: Family

## 2016-05-09 MED ORDER — VORTIOXETINE HBR 5 MG PO TABS: 1.0000 | ORAL_TABLET | Freq: Every day | ORAL | Status: DC

## 2016-05-09 NOTE — Telephone Encounter (Signed)
°  Relationship to patient: Self  Can be reached: [phone]  Pharmacy:  [facility] [address] ([address]), [place] - [place] [id number] (Phone) [phone] (Fax)        Reason for call: Refill on Vortioxetine HBr (TRINTELLIX) 5 MG TABS [id number]

## 2016-05-09 NOTE — Telephone Encounter (Signed)
30 day supply sent to pharmacy. Pt was due for 6 month f/u with PCP and is past due. Needs OV for further refills. Mychart message sent to pt.

## 2016-05-11 ENCOUNTER — Telehealth: Payer: Self-pay | Admitting: Pulmonary Disease

## 2016-05-11 MED ORDER — FLUTICASONE FUROATE-VILANTEROL 100-25 MCG/INH IN AEPB: INHALATION_SPRAY | RESPIRATORY_TRACT | Status: DC

## 2016-05-11 NOTE — Telephone Encounter (Signed)
Called spoke with pt. Aware Memory Dance has been sent to the pharmacy. Nothing further needed

## 2016-05-20 ENCOUNTER — Other Ambulatory Visit: Payer: Self-pay | Admitting: Nurse Practitioner

## 2016-05-20 DIAGNOSIS — G4700 Insomnia, unspecified: Secondary | ICD-10-CM

## 2016-05-20 DIAGNOSIS — G629 Polyneuropathy, unspecified: Secondary | ICD-10-CM

## 2016-05-20 DIAGNOSIS — C180 Malignant neoplasm of cecum: Secondary | ICD-10-CM

## 2016-05-20 DIAGNOSIS — J430 Unilateral pulmonary emphysema [MacLeod's syndrome]: Secondary | ICD-10-CM

## 2016-05-20 MED ORDER — ALPRAZOLAM 1 MG PO TABS: 1.0000 mg | ORAL_TABLET | Freq: Every evening | ORAL | Status: DC | PRN

## 2016-05-20 MED ORDER — OXYCODONE-ACETAMINOPHEN 10-325 MG PO TABS: ORAL_TABLET | ORAL | Status: DC

## 2016-05-23 MED FILL — ALPRAZolam 1 MG TABS: 1 | 30 days supply | Qty: 30 | Fill #0

## 2016-05-23 MED FILL — OXYCODONE-APAP 10-325 TAB: 10-325 | 10 days supply | Qty: 120 | Fill #0

## 2016-06-20 ENCOUNTER — Other Ambulatory Visit: Payer: Self-pay | Admitting: Nurse Practitioner

## 2016-06-20 DIAGNOSIS — G629 Polyneuropathy, unspecified: Secondary | ICD-10-CM

## 2016-06-20 DIAGNOSIS — C180 Malignant neoplasm of cecum: Secondary | ICD-10-CM

## 2016-06-20 DIAGNOSIS — J430 Unilateral pulmonary emphysema [MacLeod's syndrome]: Secondary | ICD-10-CM

## 2016-06-20 DIAGNOSIS — G4700 Insomnia, unspecified: Secondary | ICD-10-CM

## 2016-06-20 MED ORDER — OXYCODONE-ACETAMINOPHEN 10-325 MG PO TABS: ORAL_TABLET | ORAL | 0 refills | Status: DC

## 2016-06-21 MED FILL — OXYCODONE-APAP 10-325 TAB: 10-325 | 10 days supply | Qty: 120 | Fill #0

## 2016-06-27 ENCOUNTER — Other Ambulatory Visit: Payer: Self-pay | Admitting: Emergency Medicine

## 2016-06-27 DIAGNOSIS — C180 Malignant neoplasm of cecum: Secondary | ICD-10-CM

## 2016-06-27 DIAGNOSIS — J430 Unilateral pulmonary emphysema [MacLeod's syndrome]: Secondary | ICD-10-CM

## 2016-06-27 DIAGNOSIS — G629 Polyneuropathy, unspecified: Secondary | ICD-10-CM

## 2016-06-27 DIAGNOSIS — G4700 Insomnia, unspecified: Secondary | ICD-10-CM

## 2016-06-27 MED ORDER — ALPRAZOLAM 1 MG PO TABS: 1.0000 mg | ORAL_TABLET | Freq: Every evening | ORAL | 0 refills | Status: DC | PRN

## 2016-06-27 MED ORDER — GABAPENTIN 600 MG PO TABS: ORAL_TABLET | ORAL | 3 refills | Status: DC

## 2016-06-30 MED FILL — GABAPENTIN 600 MG TABLET: 600 | 30 days supply | Qty: 120 | Fill #0

## 2016-06-30 MED FILL — ALPRAZolam 1 MG TABS: 1 | 30 days supply | Qty: 30 | Fill #0

## 2016-07-20 ENCOUNTER — Ambulatory Visit (HOSPITAL_BASED_OUTPATIENT_CLINIC_OR_DEPARTMENT_OTHER): Payer: 59 | Admitting: Hematology & Oncology

## 2016-07-20 ENCOUNTER — Encounter: Payer: Self-pay | Admitting: Hematology & Oncology

## 2016-07-20 ENCOUNTER — Other Ambulatory Visit (HOSPITAL_BASED_OUTPATIENT_CLINIC_OR_DEPARTMENT_OTHER): Payer: 59

## 2016-07-20 VITALS — BP 115/78 | HR 79 | Temp 97.6°F | Resp 16 | Ht 68.0 in | Wt 155.0 lb

## 2016-07-20 DIAGNOSIS — Z720 Tobacco use: Secondary | ICD-10-CM

## 2016-07-20 DIAGNOSIS — C180 Malignant neoplasm of cecum: Secondary | ICD-10-CM

## 2016-07-20 DIAGNOSIS — J430 Unilateral pulmonary emphysema [MacLeod's syndrome]: Secondary | ICD-10-CM

## 2016-07-20 DIAGNOSIS — G620 Drug-induced polyneuropathy: Secondary | ICD-10-CM

## 2016-07-20 DIAGNOSIS — G4700 Insomnia, unspecified: Secondary | ICD-10-CM

## 2016-07-20 DIAGNOSIS — G629 Polyneuropathy, unspecified: Secondary | ICD-10-CM

## 2016-07-20 LAB — CBC WITH DIFFERENTIAL (CANCER CENTER ONLY)
BASO#: 0 10*3/uL (ref 0.0–0.2)
BASO%: 0.2 % (ref 0.0–2.0)
EOS ABS: 0.3 10*3/uL (ref 0.0–0.5)
EOS%: 3.5 % (ref 0.0–7.0)
HCT: 42.5 % (ref 34.8–46.6)
HGB: 14.3 g/dL (ref 11.6–15.9)
LYMPH#: 1.9 10*3/uL (ref 0.9–3.3)
LYMPH%: 22.1 % (ref 14.0–48.0)
MCH: 23.7 pg — AB (ref 26.0–34.0)
MCHC: 33.6 g/dL (ref 32.0–36.0)
MCV: 71 fL — AB (ref 81–101)
MONO#: 0.4 10*3/uL (ref 0.1–0.9)
MONO%: 4.9 % (ref 0.0–13.0)
NEUT#: 5.8 10*3/uL (ref 1.5–6.5)
NEUT%: 69.3 % (ref 39.6–80.0)
PLATELETS: 164 10*3/uL (ref 145–400)
RBC: 6.03 10*6/uL — ABNORMAL HIGH (ref 3.70–5.32)
RDW: 16.4 % — AB (ref 11.1–15.7)
WBC: 8.4 10*3/uL (ref 3.9–10.0)

## 2016-07-20 LAB — CMP (CANCER CENTER ONLY)
ALT(SGPT): 20 U/L (ref 10–47)
AST: 22 U/L (ref 11–38)
Albumin: 3.8 g/dL (ref 3.3–5.5)
Alkaline Phosphatase: 67 U/L (ref 26–84)
BUN: 8 mg/dL (ref 7–22)
CHLORIDE: 106 meq/L (ref 98–108)
CO2: 29 meq/L (ref 18–33)
Calcium: 8.8 mg/dL (ref 8.0–10.3)
Creat: 0.9 mg/dl (ref 0.6–1.2)
GLUCOSE: 88 mg/dL (ref 73–118)
POTASSIUM: 3.8 meq/L (ref 3.3–4.7)
Sodium: 137 mEq/L (ref 128–145)
Total Bilirubin: 0.7 mg/dl (ref 0.20–1.60)
Total Protein: 7.2 g/dL (ref 6.4–8.1)

## 2016-07-20 LAB — LACTATE DEHYDROGENASE: LDH: 214 U/L (ref 125–245)

## 2016-07-20 MED ORDER — OXYCODONE-ACETAMINOPHEN 10-325 MG PO TABS: ORAL_TABLET | ORAL | 0 refills | Status: DC

## 2016-07-20 MED ORDER — ALPRAZOLAM 1 MG PO TABS
1.0000 mg | ORAL_TABLET | Freq: Every evening | ORAL | 0 refills | Status: DC | PRN
Start: 2016-07-20 — End: 2016-08-18

## 2016-07-20 MED FILL — OXYCODONE-APAP 10-325: 10-325 | 10 days supply | Qty: 120 | Fill #0

## 2016-07-20 NOTE — Progress Notes (Signed)
Hematology and Oncology Follow Up Visit  Teresa Morgan [id number] 18-Oct-1956 60 y.o. 07/20/2016   Principle Diagnosis:  1. Stage IIIB (T2 N2 M0) adenocarcinoma of the cecum. 2. Neuropathy secondary to chemotherapy.  Current Therapy:    Observation     Interim History:  Ms.  Morgan is back for followup. She is doing pretty well. 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   We do not have to do any more scans on her unless she has symptoms, or frequent some on her lab work. Her last CT scan was done back in March 2017. Everything looked very stable. As such, I don't think any additional scans will change our management on her.  She is still smoking. She is trying her best to cut back.  She's had no problems with her bowels or bladder.  Her last mammogram was recently. She had to have this done back in September 2016. Everything looked fine.  She's had no problems with her skin. She's had no joint issues. She's had no nausea or vomiting.  Her last CEA level was 2.6 back in March.  Her Port-A-Cath is now out. She is happy about this.  Her performance status is ECOG 1  Medications:  Current Outpatient Prescriptions:  .  ALPRAZolam (XANAX) 1 MG tablet, Take 1 tablet (1 mg total) by mouth at bedtime as needed for anxiety or sleep., Disp: 30 tablet, Rfl: 0 .  Calcium-Vitamin D 600-125 MG-UNIT TABS, Take 1 tablet by mouth daily. , Disp: , Rfl:  .  fluticasone furoate-vilanterol (BREO ELLIPTA) 100-25 MCG/INH AEPB, INHALE 1 PUFF BY MOUTH EVERY DAY, Disp: 60 each, Rfl: 11 .  gabapentin (NEURONTIN) 600 MG tablet, TAKE 1 TABLET (600 MG TOTAL) BY MOUTH 4 (FOUR) TIMES DAILY., Disp: 120 tablet, Rfl: 3 .  LORazepam (ATIVAN) 0.5 MG tablet, One tablet by mouth once daily in the AM as needed for anxiety (Patient taking differently: Take 0.5 mg by mouth daily as needed for anxiety. ), Disp: 30 tablet, Rfl: 0 .  Multiple Vitamin (MULTIVITAMIN) tablet, Take 1 tablet by mouth  daily.  , Disp: , Rfl:  .  oxyCODONE-acetaminophen (PERCOCET) 10-325 MG tablet, 1 - 2 tabs every 4 hours as needed for pain, Disp: 120 tablet, Rfl: 0 .  pantoprazole (PROTONIX) 40 MG tablet, TAKE 1 TABLET BY MOUTH EVERY DAY, Disp: 30 tablet, Rfl: 5 .  polyethylene glycol powder (GLYCOLAX/MIRALAX) powder, Take 17 g by mouth 2 (two) times daily as needed. (Patient taking differently: Take 17 g by mouth 2 (two) times daily as needed for moderate constipation. ), Disp: 3350 g, Rfl: 1 .  Pyridoxine HCl (VITAMIN B-6) 250 MG tablet, Take 1 tablet (250 mg total) by mouth daily., Disp: 30 tablet, Rfl: 12 .  TURMERIC PO, Take 1 tablet by mouth daily. , Disp: , Rfl:  .  vitamin E 200 UNIT capsule, Take 200 Units by mouth daily.  , Disp: , Rfl:  .  vortioxetine HBr (TRINTELLIX) 5 MG TABS, Take 1 tablet (5 mg total) by mouth daily., Disp: 30 tablet, Rfl: 0  Allergies:  Allergies  Allergen Reactions  . Aspirin     Upset stomach  . Cymbalta [Duloxetine Hcl] Swelling    Swollen lips   . Prozac [Fluoxetine Hcl] Swelling    Lip swelling  . Wellbutrin [Bupropion] Swelling    Lip swelling    Past Medical History, Surgical history, Social history, and Family History were reviewed and updated.  Review of Systems: As above  Physical Exam:  height is 5\' 8"  (1.727 m) and weight is 155 lb (70.3 kg). Her oral temperature is 97.6 F (36.4 C). Her blood pressure is 115/78 and her pulse is 79. Her respiration is 16.   Head and neck exam shows no ocular or oral lesions. She has no adenopathy in the neck. There is no scleral icterus. Lungs are clear. There are no rales, wheezes or rhonchi are noted. Cardiac exam regular rate and rhythm with no murmurs rubs or bruits. Abdomen is soft. She has good bowel sounds. There is no fluid wave. There is no palpable liver or spleen tip. She has well-healed laparoscopy scar. Back exam shows no tenderness over the spine, ribs or hips. Extremities shows no clubbing cyanosis or  edema. Neurological exam shows no focal neurological deficits. Skin exam no rashes, ecchymoses or petechia.  Lab Results  Component Value Date   WBC 8.4 07/20/2016   HGB 14.3 07/20/2016   HCT 42.5 07/20/2016   MCV 71 (L) 07/20/2016   PLT 164 07/20/2016     Chemistry      Component Value Date/Time   NA 141 04/12/2016 1332   NA 141 01/18/2016 0749   NA 142 09/21/2015 1306   K 3.6 04/12/2016 1332   K 3.9 01/18/2016 0749   K 3.8 09/21/2015 1306   CL 107 04/12/2016 1332   CL 107 01/18/2016 0749   CO2 27 04/12/2016 1332   CO2 28 01/18/2016 0749   CO2 27 09/21/2015 1306   BUN 7 04/12/2016 1332   BUN 13 01/18/2016 0749   BUN 9.8 09/21/2015 1306   CREATININE 0.84 04/12/2016 1332   CREATININE 0.9 01/18/2016 0749   CREATININE 0.8 09/21/2015 1306      Component Value Date/Time   CALCIUM 9.2 04/12/2016 1332   CALCIUM 8.5 01/18/2016 0749   CALCIUM 9.2 09/21/2015 1306   ALKPHOS 63 01/18/2016 0749   ALKPHOS 77 09/21/2015 1306   AST 24 01/18/2016 0749   AST 15 09/21/2015 1306   ALT 23 01/18/2016 0749   ALT 13 09/21/2015 1306   BILITOT 0.70 01/18/2016 0749   BILITOT 0.40 09/21/2015 1306         Impression and Plan: Teresa Morgan is 60 year old female with stage IIIB colon cancer. This was of the cecum. She underwent resection back in September of 2012. She had 4 positive lymph nodes. She completed chemotherapy in March of 2013.  I'm glad that her CT scan did not show any evidence of pulmonary disease. She is still smoking. I suppose that she is at risk for lung cancer.  We get her back in 6 months. I think this would be very reasonable.  She is still working. She probably will will work for another 1-2 years before she retires.   Volanda Napoleon, MD 9/6/20172:43 PM

## 2016-07-20 NOTE — Addendum Note (Signed)
Addended by: Burney Gauze R on: 07/20/2016 02:51 PM   Modules accepted: Orders

## 2016-07-21 LAB — CEA (IN HOUSE-CHCC): CEA (CHCC-In House): 4.54 ng/mL (ref 0.00–5.00)

## 2016-07-21 LAB — CEA: CEA: 4.7 ng/mL (ref 0.0–4.7)

## 2016-07-29 MED FILL — ALPRAZolam 1 MG TABS: 1 | 30 days supply | Qty: 30 | Fill #0

## 2016-08-01 ENCOUNTER — Telehealth: Payer: Self-pay | Admitting: Family

## 2016-08-01 NOTE — Telephone Encounter (Signed)
lvm advising patient of message below °

## 2016-08-01 NOTE — Telephone Encounter (Signed)
°  Relation to [id number] Call back [phone] Pharmacy: [facility] ([place]), [place] - [place]  Reason for call:  Patient requesting a refill vortioxetine HBr (TRINTELLIX) 5 MG

## 2016-08-01 NOTE — Telephone Encounter (Signed)
Tiffany-- please call pt and let her know she needs to be seen before we can refill this medication. We sent a 30 day supply to her pharmacy in June with a note that she needed to be seen before any further refills and we have not filled Rx since then. Thank you!

## 2016-08-08 ENCOUNTER — Encounter: Payer: Self-pay | Admitting: Family

## 2016-08-08 ENCOUNTER — Ambulatory Visit (INDEPENDENT_AMBULATORY_CARE_PROVIDER_SITE_OTHER): Payer: 59 | Admitting: Family

## 2016-08-08 DIAGNOSIS — F418 Other specified anxiety disorders: Secondary | ICD-10-CM

## 2016-08-08 DIAGNOSIS — F419 Anxiety disorder, unspecified: Principal | ICD-10-CM

## 2016-08-08 DIAGNOSIS — F329 Major depressive disorder, single episode, unspecified: Secondary | ICD-10-CM

## 2016-08-08 MED ORDER — VORTIOXETINE HBR 5 MG PO TABS: ORAL_TABLET | ORAL | 3 refills | Status: DC

## 2016-08-08 MED FILL — TRINTELLIX 5 MG TABLET: 5 | 30 days supply | Qty: 30 | Fill #0

## 2016-08-08 NOTE — Progress Notes (Signed)
Subjective:    Patient ID: Teresa Morgan, female    DOB: August 09, 1956, 60 y.o.   MRN: [id number]  HPI  Teresa Morgan is a 60 yr old female who presents today for follow up of her anxiety and depression.  Reports that she has some anxiety at this time. Depression symptoms come and go. Reports that she tries not to take the trintellix every day.  Afraid she will become She had her flu shot done at work.   Review of Systems See HPI  Past Medical History:  Diagnosis Date  . Anemia   . Anxiety   . Arthritis   . Colon cancer ([place])   . COPD with emphysema ([place]) 06/18/2012  . Depression   . GERD (gastroesophageal reflux disease)   . Peptic ulcer   . Pneumonia   . Shortness of breath dyspnea    with exertion     Social History   Social History  . Marital status: Married    Spouse name: N/A  . Number of children: 3  . Years of education: N/A   Occupational History  . 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 Smoking status: Current Every Day Smoker    Packs/day: 1.00    Years: 20.00    Types: Cigarettes    Start date: 04/23/1986  . Smokeless tobacco: Never Used     Comment: quit 8 years ago  . Alcohol use 0.0 oz/week     Comment: social  . Drug use: No  . Sexual activity: Not on file   Other Topics Concern  . 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 APPENDECTOMY  07/07/11  . COLON RESECTION  2012   for colon cancer  . COLONOSCOPY    . EYE SURGERY Bilateral    cataract surgery with lens implant  . heel spurs Right   . hemrrhoids    . PLANTAR FASCIA SURGERY Right 2009  . PORT-A-CATH REMOVAL Left 04/19/2016   Procedure: REMOVAL PORT-A-CATH;  Surgeon: [name], MD;  Location: [facility];  Service: General;  Laterality: Left;  . portacath    . TUBAL LIGATION  07/1980    Family History  Problem Relation  Age of Onset  . Diabetes Mother   . Colon cancer Cousin     Allergies  Allergen Reactions  . Aspirin     Upset stomach  . Cymbalta [Duloxetine Hcl] Swelling    Swollen lips   . Prozac [Fluoxetine Hcl] Swelling    Lip swelling  . Wellbutrin [Bupropion] Swelling    Lip swelling    Current Outpatient Prescriptions on File Prior to Visit  Medication Sig Dispense Refill  . ALPRAZolam (XANAX) 1 MG tablet Take 1 tablet (1 mg total) by mouth at bedtime as needed for anxiety or sleep. 30 tablet 0  . Calcium-Vitamin D 600-125 MG-UNIT TABS Take 1 tablet by mouth daily.     . fluticasone furoate-vilanterol (BREO ELLIPTA) 100-25 MCG/INH AEPB INHALE 1 PUFF BY MOUTH EVERY DAY 60 each 11  . gabapentin (NEURONTIN) 600 MG tablet TAKE 1 TABLET (600 MG TOTAL) BY MOUTH 4 (FOUR) TIMES DAILY. 120 tablet 3  . Multiple Vitamin (MULTIVITAMIN) tablet Take 1 tablet by mouth daily.      Marland Kitchen oxyCODONE-acetaminophen (  PERCOCET) 10-325 MG tablet 1 - 2 tabs every 4 hours as needed for pain 120 tablet 0  . pantoprazole (PROTONIX) 40 MG tablet TAKE 1 TABLET BY MOUTH EVERY DAY 30 tablet 5  . polyethylene glycol powder (GLYCOLAX/MIRALAX) powder Take 17 g by mouth 2 (two) times daily as needed. (Patient taking differently: Take 17 g by mouth 2 (two) times daily as needed for moderate constipation. ) 3350 g 1  . Pyridoxine HCl (VITAMIN B-6) 250 MG tablet Take 1 tablet (250 mg total) by mouth daily. 30 tablet 12  . TURMERIC PO Take 1 tablet by mouth daily.     . vitamin E 200 UNIT capsule Take 200 Units by mouth daily.      Marland Kitchen vortioxetine HBr (TRINTELLIX) 5 MG TABS Take 1 tablet (5 mg total) by mouth daily. 30 tablet 0   No current facility-administered medications on file prior to visit.     BP 125/73 (BP Location: Right Arm, Cuff Size: Normal)   Pulse 78   Temp 98.3 F (36.8 C) (Oral)   Resp 16   Ht 5\' 7"  (1.702 m)   Wt 155 lb (70.3 kg)   SpO2 100% Comment: room air  BMI 24.28 kg/m       Objective:    Physical Exam  Constitutional: She is oriented to person, place, and time. She appears well-developed and well-nourished.  HENT:  Head: Normocephalic and atraumatic.  Cardiovascular: Normal rate, regular rhythm and normal heart sounds.   No murmur heard. Pulmonary/Chest: Effort normal and breath sounds normal. No respiratory distress. She has no wheezes.  Musculoskeletal: She exhibits no edema.  Neurological: She is alert and oriented to person, place, and time.  Psychiatric: She has a normal mood and affect. Her behavior is normal. Judgment and thought content normal.          Assessment & Plan:

## 2016-08-08 NOTE — Assessment & Plan Note (Addendum)
Not optimally controlled. Advised pt to take trintellix daily to obtain full benefit.  Advised pt that this is not a habit forming drug, but that in the future if she wants to come off we will need to taper her off. Restart 5mg  daily. Will re-assess in next 3 months at her [facility].  15 minutes spent with pt today.  >50% of this time was spent counseling patient on anxiety and depression treatment.

## 2016-08-08 NOTE — Patient Instructions (Signed)
Take Trintellix every day. Schedule a complete physical at the front desk for some time in the next 3 months.

## 2016-08-08 NOTE — Progress Notes (Signed)
Pre visit review using our clinic review tool, if applicable. No additional management support is needed unless otherwise documented below in the visit note. 

## 2016-08-15 ENCOUNTER — Ambulatory Visit (INDEPENDENT_AMBULATORY_CARE_PROVIDER_SITE_OTHER): Payer: 59 | Admitting: Pulmonary Disease

## 2016-08-15 ENCOUNTER — Encounter: Payer: Self-pay | Admitting: Pulmonary Disease

## 2016-08-15 ENCOUNTER — Other Ambulatory Visit: Payer: Self-pay | Admitting: *Deleted

## 2016-08-15 DIAGNOSIS — J301 Allergic rhinitis due to pollen: Secondary | ICD-10-CM

## 2016-08-15 DIAGNOSIS — J439 Emphysema, unspecified: Secondary | ICD-10-CM

## 2016-08-15 MED ORDER — FLUTICASONE FUROATE-VILANTEROL 100-25 MCG/INH IN AEPB: INHALATION_SPRAY | RESPIRATORY_TRACT | 11 refills | Status: DC

## 2016-08-15 MED FILL — BREO ELLIPTA 100-25 MCG INH: 100-25 | 30 days supply | Qty: 60 | Fill #0

## 2016-08-15 NOTE — Assessment & Plan Note (Signed)
Continue Claritin

## 2016-08-15 NOTE — Assessment & Plan Note (Signed)
Continue breo We discussed early signs and symptoms of bronchitis

## 2016-08-15 NOTE — Progress Notes (Signed)
   Subjective:    Patient ID: Teresa Morgan, female    DOB: 1956/10/09, 60 y.o.   MRN: [id number]  HPI  60 yo female smoker with GOLD B COPD/Emphysema.  Hx of Cecum adenocarcinoma Stage III B f/by Dr. Martha Clan.  S/p resection and chemo 2012-2013   08/15/2016  Chief Complaint  Patient presents with  . Follow-up    breathing is doing okay except when outdoors doing yardwork, she will get SOB.  no concerns.   She gets an episode of bronchitis about once a year Has done well on breo No wheezing-has not needed rescue MDI 2 often Denies nocturnal symptoms or pedal edema She works at the [employer] and [employer]  and is thinking of retiring in 2 years She remains on Protonix for GERD   CT chest in 2015 with tiny nodules .  CT chest in 01/2016 with stable right lung nodules , additional nodules improved or resolved.    Review of Systems Patient denies significant dyspnea,cough, hemoptysis,  chest pain, palpitations, pedal edema, orthopnea, paroxysmal nocturnal dyspnea, lightheadedness, nausea, vomiting, abdominal or  leg pains      Objective:   Physical Exam  Gen. Pleasant, well-nourished, in no distress ENT - no lesions, no post nasal drip Neck: No JVD, no thyromegaly, no carotid bruits Lungs: no use of accessory muscles, no dullness to percussion, clear without rales or rhonchi  Cardiovascular: Rhythm regular, heart sounds  normal, no murmurs or gallops, no peripheral edema Musculoskeletal: No deformities, no cyanosis or clubbing        Assessment & Plan:

## 2016-08-15 NOTE — Patient Instructions (Signed)
Refills on BREO

## 2016-08-18 ENCOUNTER — Other Ambulatory Visit: Payer: Self-pay | Admitting: *Deleted

## 2016-08-18 DIAGNOSIS — J430 Unilateral pulmonary emphysema [MacLeod's syndrome]: Secondary | ICD-10-CM

## 2016-08-18 DIAGNOSIS — G629 Polyneuropathy, unspecified: Secondary | ICD-10-CM

## 2016-08-18 DIAGNOSIS — C180 Malignant neoplasm of cecum: Secondary | ICD-10-CM

## 2016-08-18 MED ORDER — GABAPENTIN 600 MG PO TABS: ORAL_TABLET | ORAL | 3 refills | Status: DC

## 2016-08-18 MED ORDER — OXYCODONE-ACETAMINOPHEN 10-325 MG PO TABS: ORAL_TABLET | ORAL | 0 refills | Status: DC

## 2016-08-18 MED ORDER — ALPRAZOLAM 1 MG PO TABS: 1.0000 mg | ORAL_TABLET | Freq: Every evening | ORAL | 0 refills | Status: DC | PRN

## 2016-08-19 MED FILL — OXYCODONE-APAP 10-325: 10-325 | 10 days supply | Qty: 120 | Fill #0

## 2016-08-24 MED FILL — GABAPENTIN 600 MG TABLET: 600 | 30 days supply | Qty: 120 | Fill #1

## 2016-08-26 MED FILL — ALPRAZolam 1 MG TABS: 1 | 30 days supply | Qty: 30 | Fill #0

## 2016-09-19 ENCOUNTER — Other Ambulatory Visit: Payer: Self-pay | Admitting: Nurse Practitioner

## 2016-09-19 DIAGNOSIS — J430 Unilateral pulmonary emphysema [MacLeod's syndrome]: Secondary | ICD-10-CM

## 2016-09-19 DIAGNOSIS — C180 Malignant neoplasm of cecum: Secondary | ICD-10-CM

## 2016-09-19 DIAGNOSIS — G629 Polyneuropathy, unspecified: Secondary | ICD-10-CM

## 2016-09-19 MED ORDER — OXYCODONE-ACETAMINOPHEN 10-325 MG PO TABS: ORAL_TABLET | ORAL | 0 refills | Status: DC

## 2016-09-19 MED FILL — TRINTELLIX 5 MG TABLET: 5 | 30 days supply | Qty: 30 | Fill #1

## 2016-09-20 MED FILL — OXYCODONE-APAP 10-325: 10-325 | 10 days supply | Qty: 120 | Fill #0

## 2016-09-26 ENCOUNTER — Other Ambulatory Visit: Payer: Self-pay | Admitting: *Deleted

## 2016-09-26 DIAGNOSIS — C180 Malignant neoplasm of cecum: Secondary | ICD-10-CM

## 2016-09-26 DIAGNOSIS — G629 Polyneuropathy, unspecified: Secondary | ICD-10-CM

## 2016-09-26 DIAGNOSIS — J430 Unilateral pulmonary emphysema [MacLeod's syndrome]: Secondary | ICD-10-CM

## 2016-09-28 MED ORDER — ALPRAZOLAM 1 MG PO TABS: 1.0000 mg | ORAL_TABLET | Freq: Every evening | ORAL | 0 refills | Status: DC | PRN

## 2016-09-28 MED FILL — ALPRAZolam 1 MG TABS: 1 | 30 days supply | Qty: 30 | Fill #0

## 2016-10-12 ENCOUNTER — Other Ambulatory Visit: Payer: Self-pay | Admitting: *Deleted

## 2016-10-14 ENCOUNTER — Other Ambulatory Visit: Payer: Self-pay | Admitting: *Deleted

## 2016-10-14 DIAGNOSIS — J430 Unilateral pulmonary emphysema [MacLeod's syndrome]: Secondary | ICD-10-CM

## 2016-10-14 DIAGNOSIS — G629 Polyneuropathy, unspecified: Secondary | ICD-10-CM

## 2016-10-14 DIAGNOSIS — C180 Malignant neoplasm of cecum: Secondary | ICD-10-CM

## 2016-10-14 MED ORDER — OXYCODONE-ACETAMINOPHEN 10-325 MG PO TABS: ORAL_TABLET | ORAL | 0 refills | Status: DC

## 2016-10-17 MED FILL — OXYCODONE-APAP 10-325: 10-325 | 10 days supply | Qty: 120 | Fill #0

## 2016-10-19 ENCOUNTER — Other Ambulatory Visit: Payer: Self-pay

## 2016-10-19 ENCOUNTER — Telehealth: Payer: Self-pay | Admitting: Pulmonary Disease

## 2016-10-19 ENCOUNTER — Telehealth: Payer: Self-pay | Admitting: Family

## 2016-10-19 DIAGNOSIS — C180 Malignant neoplasm of cecum: Secondary | ICD-10-CM

## 2016-10-19 DIAGNOSIS — G629 Polyneuropathy, unspecified: Secondary | ICD-10-CM

## 2016-10-19 DIAGNOSIS — J430 Unilateral pulmonary emphysema [MacLeod's syndrome]: Secondary | ICD-10-CM

## 2016-10-19 MED ORDER — GABAPENTIN 600 MG PO TABS: ORAL_TABLET | ORAL | 3 refills | Status: DC

## 2016-10-19 MED FILL — TRINTELLIX 5 MG TABLET: 5 | 30 days supply | Qty: 30 | Fill #2

## 2016-10-19 MED FILL — BREO ELLIPTA 100-25 MCG INH: 100-25 | 30 days supply | Qty: 60 | Fill #1

## 2016-10-19 NOTE — Telephone Encounter (Signed)
Pt calling back a/b rx, told her to just call the medcnt for this.Teresa Morgan

## 2016-10-19 NOTE — Telephone Encounter (Signed)
Spoke with pharmacy and confirmed that pt still has 2 refills left on file for trintellix. They will process refill. Notified pt.

## 2016-10-19 NOTE — Telephone Encounter (Signed)
LMOM TCB x1   On 08/15/16 a rx was sent to [facility] [place] [facility] for her Breo with #11 refills

## 2016-10-19 NOTE — Telephone Encounter (Signed)
Patient is requesting a refill of vortioxetine HBr (TRINTELLIX) 5 MG TABS Please advise.    Pharmacy: [facility], [place] [address]

## 2016-10-21 ENCOUNTER — Other Ambulatory Visit: Payer: Self-pay | Admitting: *Deleted

## 2016-10-21 DIAGNOSIS — J430 Unilateral pulmonary emphysema [MacLeod's syndrome]: Secondary | ICD-10-CM

## 2016-10-21 DIAGNOSIS — G629 Polyneuropathy, unspecified: Secondary | ICD-10-CM

## 2016-10-21 DIAGNOSIS — C180 Malignant neoplasm of cecum: Secondary | ICD-10-CM

## 2016-10-21 MED ORDER — GABAPENTIN 600 MG PO TABS: ORAL_TABLET | ORAL | 3 refills | Status: DC

## 2016-10-21 MED ORDER — ALPRAZOLAM 1 MG PO TABS: 1.0000 mg | ORAL_TABLET | Freq: Every evening | ORAL | 0 refills | Status: DC | PRN

## 2016-10-21 MED FILL — GABAPENTIN 600 MG TABLET: 600 | 30 days supply | Qty: 120 | Fill #0

## 2016-10-31 ENCOUNTER — Other Ambulatory Visit: Payer: Self-pay | Admitting: *Deleted

## 2016-10-31 ENCOUNTER — Ambulatory Visit (INDEPENDENT_AMBULATORY_CARE_PROVIDER_SITE_OTHER): Payer: 59 | Admitting: Family

## 2016-10-31 ENCOUNTER — Encounter: Payer: Self-pay | Admitting: Family

## 2016-10-31 DIAGNOSIS — C180 Malignant neoplasm of cecum: Secondary | ICD-10-CM

## 2016-10-31 DIAGNOSIS — G629 Polyneuropathy, unspecified: Secondary | ICD-10-CM

## 2016-10-31 DIAGNOSIS — F418 Other specified anxiety disorders: Secondary | ICD-10-CM | POA: Diagnosis not present

## 2016-10-31 DIAGNOSIS — F419 Anxiety disorder, unspecified: Principal | ICD-10-CM

## 2016-10-31 DIAGNOSIS — J430 Unilateral pulmonary emphysema [MacLeod's syndrome]: Secondary | ICD-10-CM

## 2016-10-31 DIAGNOSIS — F329 Major depressive disorder, single episode, unspecified: Secondary | ICD-10-CM

## 2016-10-31 DIAGNOSIS — F32A Depression, unspecified: Secondary | ICD-10-CM

## 2016-10-31 MED ORDER — ALPRAZOLAM 1 MG PO TABS: 1.0000 mg | ORAL_TABLET | Freq: Every evening | ORAL | 0 refills | Status: DC | PRN

## 2016-10-31 MED ORDER — VORTIOXETINE HBR 5 MG PO TABS: ORAL_TABLET | ORAL | 5 refills | Status: DC

## 2016-10-31 MED FILL — ALPRAZolam 1 MG TABS: 1 | 30 days supply | Qty: 30 | Fill #0

## 2016-10-31 NOTE — Patient Instructions (Signed)
Continue trintellix 

## 2016-10-31 NOTE — Assessment & Plan Note (Signed)
Stable and improved on current dose of trintellix. Continue same.

## 2016-10-31 NOTE — Progress Notes (Signed)
Pre visit review using our clinic review tool, if applicable. No additional management support is needed unless otherwise documented below in the visit note. 

## 2016-10-31 NOTE — Progress Notes (Signed)
Subjective:    Patient ID: Teresa Morgan, female    DOB: [birth year], 60 y.o.   MRN: [id number]  HPI   Teresa Morgan is a 60 yr old female who presents today for follow up of her anxiety and depression.  Last visit she stated that she was not taking the Trintellix every day. She was advised to begin taking daily for maximal improvement of her anxiety and depression. She reports improved compliance. Reports that her anxiety and her depression are well controlled. She denies side effects from the medication. 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    Review of Systems See HPI  Past Medical History:  Diagnosis Date  . Anemia   . Anxiety   . Arthritis   . Colon cancer ([place])   . COPD with emphysema ([place]) 06/18/2012  . Depression   . GERD (gastroesophageal reflux disease)   . Peptic ulcer   . Pneumonia   . Shortness of breath dyspnea    with exertion     Social History   Social History  . Marital status: Married    Spouse name: N/A  . Number of children: 3  . Years of education: N/A   Occupational History  . 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 Smoking status: Current Every Day Smoker    Packs/day: 1.00    Years: 20.00    Types: Cigarettes    Start date: 04/23/1986  . Smokeless tobacco: Never Used  . Alcohol use 0.0 oz/week     Comment: social  . Drug use: No  . Sexual activity: Not on file   Other Topics Concern  . 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 APPENDECTOMY  07/07/11  . COLON RESECTION  2012   for colon cancer  . COLONOSCOPY    . EYE SURGERY Bilateral    cataract surgery with lens implant  . heel spurs Right   . hemrrhoids    . PLANTAR FASCIA SURGERY Right 2009  . PORT-A-CATH REMOVAL Left 04/19/2016   Procedure: REMOVAL PORT-A-CATH;  Surgeon: [name], MD;  Location: [facility];  Service: General;  Laterality: Left;  . portacath    . TUBAL LIGATION  07/1980    Family History  Problem Relation Age of Onset  . Diabetes Mother   . Colon cancer Cousin     Allergies  Allergen Reactions  . Aspirin     Upset stomach  . Cymbalta [Duloxetine Hcl] Swelling    Swollen lips   . Prozac [Fluoxetine Hcl] Swelling    Lip swelling  . Wellbutrin [Bupropion] Swelling    Lip swelling    Current Outpatient Prescriptions on File Prior to Visit  Medication Sig Dispense Refill  . Calcium-Vitamin D 600-125 MG-UNIT TABS Take 1 tablet by mouth daily.     . fluticasone furoate-vilanterol (BREO ELLIPTA) 100-25 MCG/INH AEPB INHALE 1 PUFF BY MOUTH EVERY DAY 60 each 11  . gabapentin (NEURONTIN) 600 MG tablet TAKE 1 TABLET (600 MG TOTAL) BY MOUTH 4 (FOUR) TIMES DAILY. 120 tablet 3  . Multiple Vitamin (MULTIVITAMIN) tablet Take 1 tablet by mouth daily.      Marland Kitchen oxyCODONE-acetaminophen (PERCOCET) 10-325 MG tablet 1 - 2 tabs  every 4 hours as needed for pain 120 tablet 0  . pantoprazole (PROTONIX) 40 MG tablet TAKE 1 TABLET BY MOUTH EVERY DAY 30 tablet 5  . polyethylene glycol powder (GLYCOLAX/MIRALAX) powder Take 17 g by mouth 2 (two) times daily as needed. (Patient taking differently: Take 17 g by mouth 2 (two) times daily as needed for moderate constipation. ) 3350 g 1  . Pyridoxine HCl (VITAMIN B-6) 250 MG tablet Take 1 tablet (250 mg total) by mouth daily. 30 tablet 12  . TURMERIC PO Take 1 tablet by mouth daily.     . vitamin E 200 UNIT capsule Take 200 Units by mouth daily.      Marland Kitchen vortioxetine HBr (TRINTELLIX) 5 MG TABS 1 tablet by mouth once daily 30 tablet 3   No current facility-administered medications on file prior to visit.     BP 126/77 (BP Location: Right Arm, Cuff Size: Normal)   Pulse 77   Temp 98.3 F (36.8 C) (Oral)   Resp 18   Ht 5\' 7"  (1.702 m)   Wt 155 lb 6.4 oz (70.5 kg)   SpO2 98% Comment: room air  BMI 24.34 kg/m         Objective:   Physical Exam  Constitutional: She is oriented to person, place, and time. She appears well-developed and well-nourished.  HENT:  Head: Normocephalic and atraumatic.  Cardiovascular: Normal rate, regular rhythm and normal heart sounds.   No murmur heard. Pulmonary/Chest: Effort normal and breath sounds normal. No respiratory distress. She has no wheezes.  Musculoskeletal: She exhibits no edema.  Neurological: She is alert and oriented to person, place, and time.  Psychiatric: She has a normal mood and affect. Her behavior is normal. Judgment and thought content normal.          Assessment & Plan:

## 2016-11-15 ENCOUNTER — Other Ambulatory Visit: Payer: Self-pay | Admitting: *Deleted

## 2016-11-15 DIAGNOSIS — C180 Malignant neoplasm of cecum: Secondary | ICD-10-CM

## 2016-11-15 DIAGNOSIS — G629 Polyneuropathy, unspecified: Secondary | ICD-10-CM

## 2016-11-15 DIAGNOSIS — J430 Unilateral pulmonary emphysema [MacLeod's syndrome]: Secondary | ICD-10-CM

## 2016-11-15 MED ORDER — OXYCODONE-ACETAMINOPHEN 10-325 MG PO TABS: ORAL_TABLET | ORAL | 0 refills | Status: DC

## 2016-11-15 MED FILL — OXYCODONE-APAP 10-325: 10-325 | 10 days supply | Qty: 120 | Fill #0

## 2016-11-15 NOTE — Telephone Encounter (Signed)
Script left for patient at front desk, caller notified

## 2016-11-22 ENCOUNTER — Other Ambulatory Visit: Payer: Self-pay | Admitting: *Deleted

## 2016-11-22 DIAGNOSIS — C180 Malignant neoplasm of cecum: Secondary | ICD-10-CM

## 2016-11-22 DIAGNOSIS — G629 Polyneuropathy, unspecified: Secondary | ICD-10-CM

## 2016-11-22 DIAGNOSIS — J430 Unilateral pulmonary emphysema [MacLeod's syndrome]: Secondary | ICD-10-CM

## 2016-11-22 MED ORDER — ALPRAZOLAM 1 MG PO TABS: 1.0000 mg | ORAL_TABLET | Freq: Every evening | ORAL | 0 refills | Status: DC | PRN

## 2016-12-02 MED FILL — ALPRAZolam 1 MG TABS: 1 | 30 days supply | Qty: 30 | Fill #0

## 2016-12-05 MED FILL — TRINTELLIX 5 MG TABLET: 5 | 30 days supply | Qty: 30 | Fill #3

## 2016-12-12 ENCOUNTER — Encounter: Payer: Self-pay | Admitting: Family

## 2016-12-12 ENCOUNTER — Telehealth: Payer: Self-pay | Admitting: Pulmonary Disease

## 2016-12-12 ENCOUNTER — Other Ambulatory Visit: Payer: Self-pay | Admitting: *Deleted

## 2016-12-12 DIAGNOSIS — J430 Unilateral pulmonary emphysema [MacLeod's syndrome]: Secondary | ICD-10-CM

## 2016-12-12 DIAGNOSIS — C180 Malignant neoplasm of cecum: Secondary | ICD-10-CM

## 2016-12-12 DIAGNOSIS — G629 Polyneuropathy, unspecified: Secondary | ICD-10-CM

## 2016-12-12 MED ORDER — OXYCODONE-ACETAMINOPHEN 10-325 MG PO TABS: ORAL_TABLET | ORAL | 0 refills | Status: DC

## 2016-12-12 NOTE — Telephone Encounter (Signed)
Left message for patient to call back to verify pharmacy before sending RX.

## 2016-12-13 ENCOUNTER — Telehealth: Payer: Self-pay | Admitting: Family

## 2016-12-13 MED ORDER — FLUTICASONE FUROATE-VILANTEROL 100-25 MCG/INH IN AEPB: INHALATION_SPRAY | RESPIRATORY_TRACT | 5 refills | Status: DC

## 2016-12-13 MED FILL — OXYCODONE-APAP 10-325: 10-325 | 10 days supply | Qty: 120 | Fill #0

## 2016-12-13 MED FILL — GABAPENTIN 600 MG TABLET: 600 | 30 days supply | Qty: 120 | Fill #1

## 2016-12-13 NOTE — Telephone Encounter (Signed)
Pt returning call.Teresa Morgan ° °

## 2016-12-13 NOTE — Telephone Encounter (Signed)
lmtcb X2 for pt.  

## 2016-12-13 NOTE — Telephone Encounter (Signed)
Ok to waive the fee.

## 2016-12-13 NOTE — Telephone Encounter (Signed)
Spoke with pt. She is needing a refill on Breo. I have clarified which pharmacy she wants this to go to. Rx has been sent in. Nothing further was needed.

## 2016-12-13 NOTE — Telephone Encounter (Signed)
Patient requesting 12/12/2016 $50 no show fee waive due to patient VM not being set up, patient [facility] appointment to 01/30/17, charge or no charge

## 2016-12-29 ENCOUNTER — Other Ambulatory Visit: Payer: Self-pay | Admitting: *Deleted

## 2016-12-29 ENCOUNTER — Telehealth: Payer: Self-pay | Admitting: Family

## 2016-12-29 DIAGNOSIS — J430 Unilateral pulmonary emphysema [MacLeod's syndrome]: Secondary | ICD-10-CM

## 2016-12-29 DIAGNOSIS — C180 Malignant neoplasm of cecum: Secondary | ICD-10-CM

## 2016-12-29 DIAGNOSIS — G629 Polyneuropathy, unspecified: Secondary | ICD-10-CM

## 2016-12-29 MED ORDER — ALPRAZOLAM 1 MG PO TABS: 1.0000 mg | ORAL_TABLET | Freq: Every evening | ORAL | 0 refills | Status: DC | PRN

## 2016-12-29 NOTE — Telephone Encounter (Signed)
Relation to [id number] Call back [phone] Pharmacy:  [facility] ([address]), [place] [id number] (Phone) [phone] (Fax)     Reason for call:  Patient requesting a refill vortioxetine HBr (TRINTELLIX) 5 MG TABS, patient has a scheduled appointment for 01/16/17, please advise

## 2016-12-29 NOTE — Telephone Encounter (Signed)
lvm advising patient of message below °

## 2016-12-29 NOTE — Telephone Encounter (Signed)
Trintillex was refilled on 10/31/2016 #30 and 5 refills by Melissa to [facility].

## 2017-01-09 ENCOUNTER — Other Ambulatory Visit: Payer: Self-pay | Admitting: *Deleted

## 2017-01-09 ENCOUNTER — Telehealth: Payer: Self-pay | Admitting: *Deleted

## 2017-01-09 DIAGNOSIS — C180 Malignant neoplasm of cecum: Secondary | ICD-10-CM

## 2017-01-09 DIAGNOSIS — J430 Unilateral pulmonary emphysema [MacLeod's syndrome]: Secondary | ICD-10-CM

## 2017-01-09 DIAGNOSIS — G629 Polyneuropathy, unspecified: Secondary | ICD-10-CM

## 2017-01-09 MED ORDER — OXYCODONE-ACETAMINOPHEN 10-325 MG PO TABS: ORAL_TABLET | ORAL | 0 refills | Status: DC

## 2017-01-09 NOTE — Telephone Encounter (Signed)
Received fax from [facility] requesting orders for bilateral knee high compression stockings due to swelling of feet/legs, spider veins, plantar pain and ?neuropathy. Form needs dx code and PCP signature. Form forwarded to PCP's red folder for completion.

## 2017-01-09 NOTE — Telephone Encounter (Signed)
Form completed and placed in red completed paperwork folder.

## 2017-01-10 MED FILL — OXYCODONE-ACETAMINOPHEN 10-: 10-325 | 10 days supply | Qty: 120 | Fill #0

## 2017-01-16 ENCOUNTER — Telehealth: Payer: Self-pay | Admitting: Family

## 2017-01-16 ENCOUNTER — Telehealth: Payer: Self-pay | Admitting: Gastroenterology

## 2017-01-16 ENCOUNTER — Encounter: Payer: Self-pay | Admitting: Family

## 2017-01-16 ENCOUNTER — Ambulatory Visit (INDEPENDENT_AMBULATORY_CARE_PROVIDER_SITE_OTHER): Payer: 59 | Admitting: Family

## 2017-01-16 ENCOUNTER — Other Ambulatory Visit (HOSPITAL_COMMUNITY)
Admission: RE | Admit: 2017-01-16 | Discharge: 2017-01-16 | Disposition: A | Discharge status: Home / Self Care | Payer: 59 | Source: Ambulatory Visit | Attending: Family | Admitting: Family

## 2017-01-16 VITALS — BP 106/78 | HR 83 | Temp 98.4°F | Ht 67.0 in | Wt 154.5 lb

## 2017-01-16 DIAGNOSIS — Z0000 Encounter for general adult medical examination without abnormal findings: Secondary | ICD-10-CM | POA: Diagnosis not present

## 2017-01-16 DIAGNOSIS — Z1151 Encounter for screening for human papillomavirus (HPV): Secondary | ICD-10-CM | POA: Insufficient documentation

## 2017-01-16 DIAGNOSIS — R9431 Abnormal electrocardiogram [ECG] [EKG]: Secondary | ICD-10-CM

## 2017-01-16 DIAGNOSIS — Z01419 Encounter for gynecological examination (general) (routine) without abnormal findings: Secondary | ICD-10-CM | POA: Insufficient documentation

## 2017-01-16 DIAGNOSIS — G5603 Carpal tunnel syndrome, bilateral upper limbs: Secondary | ICD-10-CM | POA: Diagnosis not present

## 2017-01-16 DIAGNOSIS — E2839 Other primary ovarian failure: Secondary | ICD-10-CM

## 2017-01-16 NOTE — Progress Notes (Signed)
   Subjective:    Patient ID: Teresa Morgan, female    DOB: 07/02/1956, 61 y.o.   MRN: [id number]  HPI  Patient presents today for complete physical.  Immunizations: prevnar, flu and tetanus up to date  Diet: reports diet is healthy Exercise: not exercising regularly Colonoscopy: due Dexa: due  Pap Smear: due Mammogram: 9/16    Review of Systems  Constitutional: Negative for unexpected weight change.  HENT: Negative for hearing loss and rhinorrhea.   Eyes: Negative for visual disturbance.  Respiratory: Negative for cough.   Cardiovascular: Negative for leg swelling.  Gastrointestinal: Negative for constipation and diarrhea.  Genitourinary: Negative for dysuria and frequency.  Musculoskeletal: Negative for arthralgias and myalgias.       She reports some tingling in her fingers up her arm. Has been present x 1 month.   Neurological: Negative for headaches.  Hematological: Negative for adenopathy.  Psychiatric/Behavioral:       Denies depression/anxiety       Objective:   Physical Exam Physical Exam  Constitutional: She is oriented to person, place, and time. She appears well-developed and well-nourished. No distress.  HENT:  Head: Normocephalic and atraumatic.  Right Ear: Tympanic membrane and ear canal normal.  Left Ear: Tympanic membrane and ear canal normal.  Mouth/Throat: Oropharynx is clear and moist.  Eyes: Pupils are equal, round, and reactive to light. No scleral icterus.  Neck: Normal range of motion. No thyromegaly present.  Cardiovascular: Normal rate and regular rhythm.   No murmur heard. Pulmonary/Chest: Effort normal and breath sounds normal. No respiratory distress. He has no wheezes. She has no rales. She exhibits no tenderness.  Abdominal: Soft. Bowel sounds are normal. She exhibits no distension and no mass. There is no tenderness. There is no rebound and no guarding.  Musculoskeletal: She exhibits no edema.  Lymphadenopathy:    She has no cervical  adenopathy.  Neurological: She is alert and oriented to person, place, and time. She has normal patellar reflexes. She exhibits normal muscle tone. Coordination normal. bilateral + tinels + phalans R>L.  Skin: Skin is warm and dry.  Psychiatric: She has a normal mood and affect. Her behavior is normal. Judgment and thought content normal.  Breasts: Examined lying Right: Without masses,  discharge or axillary adenopathy. Right nipple inverted Left: Without masses, retractions, discharge or axillary adenopathy.  Inguinal/mons: Normal without inguinal adenopathy  External genitalia: Normal  BUS/Urethra/Skene's glands: Normal  Bladder: Normal  Vagina: Normal  Cervix: Normal  Uterus: normal in size, shape and contour. Midline and mobile  Adnexa/parametria:  Rt: Without masses or tenderness.  Lt: Without masses or tenderness.  Anus and perineum: Normal            Assessment & Plan:   Preventative care- discussed healthy diet, exercise.  Obtain routine labs. She will check coverage for shingles vaccine with her insurance.  Refer for bone density. Reviewed EKG, notes NSR,  Mildly short PR interval. Asymptomatic, has had short PR interval dating back to 2012.  I don't see that he has ever seen cardiology. Will arrange consultation.   Carpal tunnel- R>L, advised trial of a wrist immobilizer.        Assessment & Plan:

## 2017-01-16 NOTE — Patient Instructions (Signed)
Please complete lab work prior to leaving. Purchase a right wrist immobilizer to wear at night and as able throughout the day for your right carpal tunnel symptoms. Call if new/worsening symptoms or if not improved in 1 month.

## 2017-01-16 NOTE — Progress Notes (Signed)
Pre visit review using our clinic review tool, if applicable. No additional management support is needed unless otherwise documented below in the visit note. 

## 2017-01-16 NOTE — Telephone Encounter (Signed)
We need all records from Dr Collene Mares for review. Thanks

## 2017-01-16 NOTE — Telephone Encounter (Signed)
Left message for patient to let her know we need records from Dr.Mann's office to be reviewed first.

## 2017-01-16 NOTE — Telephone Encounter (Signed)
See my chart message

## 2017-01-17 ENCOUNTER — Encounter: Payer: Self-pay | Admitting: *Deleted

## 2017-01-17 LAB — URINALYSIS, ROUTINE W REFLEX MICROSCOPIC
BILIRUBIN URINE: NEGATIVE
Hgb urine dipstick: NEGATIVE
KETONES UR: NEGATIVE
Leukocytes, UA: NEGATIVE
Nitrite: NEGATIVE
RBC / HPF: NONE SEEN (ref 0–?)
SPECIFIC GRAVITY, URINE: 1.015 (ref 1.000–1.030)
Total Protein, Urine: NEGATIVE
UROBILINOGEN UA: 0.2 (ref 0.0–1.0)
Urine Glucose: NEGATIVE
pH: 6 (ref 5.0–8.0)

## 2017-01-17 LAB — CBC WITH DIFFERENTIAL/PLATELET
Basophils Absolute: 0.1 10*3/uL (ref 0.0–0.1)
Basophils Relative: 0.8 % (ref 0.0–3.0)
EOS PCT: 1.8 % (ref 0.0–5.0)
Eosinophils Absolute: 0.1 10*3/uL (ref 0.0–0.7)
HCT: 42.6 % (ref 36.0–46.0)
HEMOGLOBIN: 13.7 g/dL (ref 12.0–15.0)
Lymphocytes Relative: 34 % (ref 12.0–46.0)
Lymphs Abs: 2.8 10*3/uL (ref 0.7–4.0)
MCHC: 32.2 g/dL (ref 30.0–36.0)
MCV: 72.9 fl — ABNORMAL LOW (ref 78.0–100.0)
MONO ABS: 0.4 10*3/uL (ref 0.1–1.0)
Monocytes Relative: 4.8 % (ref 3.0–12.0)
Neutro Abs: 4.9 10*3/uL (ref 1.4–7.7)
Neutrophils Relative %: 58.6 % (ref 43.0–77.0)
Platelets: 160 10*3/uL (ref 150.0–400.0)
RBC: 5.84 Mil/uL — AB (ref 3.87–5.11)
RDW: 15.9 % — ABNORMAL HIGH (ref 11.5–15.5)
WBC: 8.4 10*3/uL (ref 4.0–10.5)

## 2017-01-17 LAB — HEPATIC FUNCTION PANEL
ALT: 13 U/L (ref 0–35)
AST: 17 U/L (ref 0–37)
Albumin: 4.3 g/dL (ref 3.5–5.2)
Alkaline Phosphatase: 61 U/L (ref 39–117)
BILIRUBIN DIRECT: 0.1 mg/dL (ref 0.0–0.3)
BILIRUBIN TOTAL: 0.5 mg/dL (ref 0.2–1.2)
TOTAL PROTEIN: 7.2 g/dL (ref 6.0–8.3)

## 2017-01-17 LAB — BASIC METABOLIC PANEL
BUN: 9 mg/dL (ref 6–23)
CALCIUM: 9.1 mg/dL (ref 8.4–10.5)
CO2: 31 meq/L (ref 19–32)
Chloride: 108 mEq/L (ref 96–112)
Creatinine, Ser: 0.8 mg/dL (ref 0.40–1.20)
GFR: 77.28 mL/min (ref >60.00)
GLUCOSE: 74 mg/dL (ref 70–99)
Potassium: 3.6 mEq/L (ref 3.5–5.1)
SODIUM: 141 meq/L (ref 135–145)

## 2017-01-17 LAB — LIPID PANEL
CHOL/HDL RATIO: 5
Cholesterol: 200 mg/dL (ref 0–200)
HDL: 39 mg/dL — AB (ref >39.00)
LDL Cholesterol: 129 mg/dL — ABNORMAL HIGH (ref 0–99)
NonHDL: 160.8
TRIGLYCERIDES: 161 mg/dL — AB (ref 0.0–149.0)
VLDL: 32.2 mg/dL (ref 0.0–40.0)

## 2017-01-17 LAB — TSH: TSH: 1.04 u[IU]/mL (ref 0.35–4.50)

## 2017-01-17 NOTE — Telephone Encounter (Signed)
Opened in error

## 2017-01-18 ENCOUNTER — Other Ambulatory Visit (HOSPITAL_BASED_OUTPATIENT_CLINIC_OR_DEPARTMENT_OTHER): Payer: 59

## 2017-01-18 ENCOUNTER — Telehealth: Payer: Self-pay | Admitting: *Deleted

## 2017-01-18 ENCOUNTER — Ambulatory Visit (HOSPITAL_BASED_OUTPATIENT_CLINIC_OR_DEPARTMENT_OTHER): Payer: 59 | Admitting: Hematology & Oncology

## 2017-01-18 VITALS — BP 128/69 | HR 80 | Temp 97.7°F | Resp 20 | Wt 155.1 lb

## 2017-01-18 DIAGNOSIS — Z85038 Personal history of other malignant neoplasm of large intestine: Secondary | ICD-10-CM

## 2017-01-18 DIAGNOSIS — G4700 Insomnia, unspecified: Secondary | ICD-10-CM

## 2017-01-18 DIAGNOSIS — G8929 Other chronic pain: Secondary | ICD-10-CM

## 2017-01-18 DIAGNOSIS — C180 Malignant neoplasm of cecum: Secondary | ICD-10-CM

## 2017-01-18 DIAGNOSIS — Z720 Tobacco use: Secondary | ICD-10-CM

## 2017-01-18 DIAGNOSIS — G629 Polyneuropathy, unspecified: Secondary | ICD-10-CM

## 2017-01-18 DIAGNOSIS — J430 Unilateral pulmonary emphysema [MacLeod's syndrome]: Secondary | ICD-10-CM

## 2017-01-18 LAB — CMP (CANCER CENTER ONLY)
ALBUMIN: 3.7 g/dL (ref 3.3–5.5)
ALT(SGPT): 18 U/L (ref 10–47)
AST: 19 U/L (ref 11–38)
Alkaline Phosphatase: 62 U/L (ref 26–84)
BILIRUBIN TOTAL: 0.7 mg/dL (ref 0.20–1.60)
BUN, Bld: 10 mg/dL (ref 7–22)
CALCIUM: 9.3 mg/dL (ref 8.0–10.3)
CHLORIDE: 105 meq/L (ref 98–108)
CO2: 24 mEq/L (ref 18–33)
Creat: 1 mg/dl (ref 0.6–1.2)
Glucose, Bld: 117 mg/dL (ref 73–118)
Potassium: 3.5 mEq/L (ref 3.3–4.7)
Sodium: 142 mEq/L (ref 128–145)
Total Protein: 6.8 g/dL (ref 6.4–8.1)

## 2017-01-18 LAB — LACTATE DEHYDROGENASE: LDH: 195 U/L (ref 125–245)

## 2017-01-18 LAB — CYTOLOGY - PAP
DIAGNOSIS: NEGATIVE
HPV (WINDOPATH): NOT DETECTED

## 2017-01-18 LAB — CBC WITH DIFFERENTIAL (CANCER CENTER ONLY)
BASO#: 0 10*3/uL (ref 0.0–0.2)
BASO%: 0.1 % (ref 0.0–2.0)
EOS ABS: 0.2 10*3/uL (ref 0.0–0.5)
EOS%: 2.1 % (ref 0.0–7.0)
HEMATOCRIT: 39.1 % (ref 34.8–46.6)
HEMOGLOBIN: 13.1 g/dL (ref 11.6–15.9)
LYMPH#: 1.8 10*3/uL (ref 0.9–3.3)
LYMPH%: 23 % (ref 14.0–48.0)
MCH: 23.7 pg — AB (ref 26.0–34.0)
MCHC: 33.5 g/dL (ref 32.0–36.0)
MCV: 71 fL — AB (ref 81–101)
MONO#: 0.3 10*3/uL (ref 0.1–0.9)
MONO%: 4.2 % (ref 0.0–13.0)
NEUT#: 5.5 10*3/uL (ref 1.5–6.5)
NEUT%: 70.6 % (ref 39.6–80.0)
PLATELETS: 149 10*3/uL (ref 145–400)
RBC: 5.52 10*6/uL — AB (ref 3.70–5.32)
RDW: 15.9 % — ABNORMAL HIGH (ref 11.1–15.7)
WBC: 7.8 10*3/uL (ref 3.9–10.0)

## 2017-01-18 NOTE — Progress Notes (Signed)
Hematology and Oncology Follow Up Visit  AMENDA DUCLOS [id number] 28-Oct-1956 61 y.o. 01/18/2017   Principle Diagnosis:  1. Stage IIIB (T2 N2 M0) adenocarcinoma of the cecum. 2. Neuropathy secondary to chemotherapy.  Current Therapy:    Observation     Interim History:  Ms.  Laumann is back for followup. She is doing pretty well. 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  She is still smoking. She is trying to stop this. This is a very tough for her.  She is not exercising as much as she needs to. I told her that exercise will certainly help with her lungs. We will help also with respect to any pain issues that she might have. She has have chronic pain from her surgery and chemotherapy. She is managing this pretty well.  Her last CEA level was normal at 4.7.  Her appetite is doing okay. She's having no issues with bowels or bladder.  She is due for a mammogram next week.  She needs to have a colonoscopy this year. She is changing gastroenterologist and will likely have this during the summer.  She is not had any issues with fever. She did have a little bit of bronchitis back in January.  Her performance status is ECOG 1  Medications:  Current Outpatient Prescriptions:  .  ALPRAZolam (XANAX) 1 MG tablet, Take 1 tablet (1 mg total) by mouth at bedtime as needed for anxiety or sleep., Disp: 30 tablet, Rfl: 0 .  Calcium-Vitamin D 600-125 MG-UNIT TABS, Take 1 tablet by mouth daily. , Disp: , Rfl:  .  fluticasone furoate-vilanterol (BREO ELLIPTA) 100-25 MCG/INH AEPB, INHALE 1 PUFF BY MOUTH EVERY DAY, Disp: 60 each, Rfl: 5 .  gabapentin (NEURONTIN) 600 MG tablet, TAKE 1 TABLET (600 MG TOTAL) BY MOUTH 4 (FOUR) TIMES DAILY., Disp: 120 tablet, Rfl: 3 .  Multiple Vitamin (MULTIVITAMIN) tablet, Take 1 tablet by mouth daily.  , Disp: , Rfl:  .  oxyCODONE-acetaminophen (PERCOCET) 10-325 MG tablet, 1 - 2 tabs every 4 hours as  needed for pain, Disp: 120 tablet, Rfl: 0 .  polyethylene glycol powder (GLYCOLAX/MIRALAX) powder, Take 17 g by mouth 2 (two) times daily as needed. (Patient taking differently: Take 17 g by mouth 2 (two) times daily as needed for moderate constipation. ), Disp: 3350 g, Rfl: 1 .  Pyridoxine HCl (VITAMIN B-6) 250 MG tablet, Take 1 tablet (250 mg total) by mouth daily., Disp: 30 tablet, Rfl: 12 .  TURMERIC PO, Take 1 tablet by mouth daily. , Disp: , Rfl:  .  vitamin E 200 UNIT capsule, Take 200 Units by mouth daily.  , Disp: , Rfl:  .  vortioxetine HBr (TRINTELLIX) 5 MG TABS, 1 tablet by mouth once daily, Disp: 30 tablet, Rfl: 5  Allergies:  Allergies  Allergen Reactions  . Aspirin     Upset stomach  . Cymbalta [Duloxetine Hcl] Swelling    Swollen lips   . Prozac [Fluoxetine Hcl] Swelling    Lip swelling  . Wellbutrin [Bupropion] Swelling    Lip swelling    Past Medical History, Surgical history, Social history, and Family History were reviewed and updated.  Review of Systems: As above  Physical Exam:  weight is 155 lb 1.9 oz (70.4 kg). Her oral temperature is 97.7 F (36.5 C). Her blood pressure is 128/69 and her pulse is 80. Her respiration is 20.   Head  and neck exam shows no ocular or oral lesions. She has no adenopathy in the neck. There is no scleral icterus. Lungs are clear. There are no rales, wheezes or rhonchi are noted. Cardiac exam regular rate and rhythm with no murmurs rubs or bruits. Abdomen is soft. She has good bowel sounds. There is no fluid wave. There is no palpable liver or spleen tip. She has well-healed laparoscopy scar. Back exam shows no tenderness over the spine, ribs or hips. Extremities shows no clubbing cyanosis or edema. Neurological exam shows no focal neurological deficits. Skin exam no rashes, ecchymoses or petechia.  Lab Results  Component Value Date   WBC 7.8 01/18/2017   HGB 13.1 01/18/2017   HCT 39.1 01/18/2017   MCV 71 (L) 01/18/2017   PLT 149  01/18/2017     Chemistry      Component Value Date/Time   NA 141 01/16/2017 1613   NA 137 07/20/2016 1325   NA 142 09/21/2015 1306   K 3.6 01/16/2017 1613   K 3.8 07/20/2016 1325   K 3.8 09/21/2015 1306   CL 108 01/16/2017 1613   CL 106 07/20/2016 1325   CO2 31 01/16/2017 1613   CO2 29 07/20/2016 1325   CO2 27 09/21/2015 1306   BUN 9 01/16/2017 1613   BUN 8 07/20/2016 1325   BUN 9.8 09/21/2015 1306   CREATININE 0.80 01/16/2017 1613   CREATININE 0.9 07/20/2016 1325   CREATININE 0.8 09/21/2015 1306      Component Value Date/Time   CALCIUM 9.1 01/16/2017 1613   CALCIUM 8.8 07/20/2016 1325   CALCIUM 9.2 09/21/2015 1306   ALKPHOS 61 01/16/2017 1613   ALKPHOS 67 07/20/2016 1325   ALKPHOS 77 09/21/2015 1306   AST 17 01/16/2017 1613   AST 22 07/20/2016 1325   AST 15 09/21/2015 1306   ALT 13 01/16/2017 1613   ALT 20 07/20/2016 1325   ALT 13 09/21/2015 1306   BILITOT 0.5 01/16/2017 1613   BILITOT 0.70 07/20/2016 1325   BILITOT 0.40 09/21/2015 1306         Impression and Plan: Ms. Kiesler is 61 year old female with stage IIIB colon cancer. This was of the cecum. She underwent resection back in September of 2012. She had 4 positive lymph nodes. She completed chemotherapy in March of 2013.  Thankfully, I don't see any evidence of colon cancer recurrence. She is still smoking however so she is at risk for lung cancer. At some point, we may have to get a chest x-ray on her. She is pretty much asymptomatic right now.  We will go ahead and plan to get her back in 6 months. By then, she should be close to retiring.   Volanda Napoleon, MD 3/7/20182:20 PM

## 2017-01-18 NOTE — Telephone Encounter (Signed)
Spoke with pt and advised her of mychart message regarding cardiology referral and pt is agreeable. She also requested lab / pap smear results. Advised her pap smear result still pending and we will call when results are final. Please advise?

## 2017-01-19 LAB — CEA: CEA1: 4.2 ng/mL (ref 0.0–4.7)

## 2017-01-19 LAB — CEA (IN HOUSE-CHCC): CEA (CHCC-In House): 4.88 ng/mL (ref 0.00–5.00)

## 2017-01-20 NOTE — Telephone Encounter (Signed)
Pap is negative.  HPV testing is negative.

## 2017-01-20 NOTE — Telephone Encounter (Signed)
Attempted to reach pt and left message to check mychart acct. Message sent. 

## 2017-01-23 ENCOUNTER — Ambulatory Visit (HOSPITAL_BASED_OUTPATIENT_CLINIC_OR_DEPARTMENT_OTHER): Payer: Self-pay

## 2017-01-23 ENCOUNTER — Other Ambulatory Visit (HOSPITAL_BASED_OUTPATIENT_CLINIC_OR_DEPARTMENT_OTHER): Payer: Self-pay

## 2017-01-30 ENCOUNTER — Encounter: Payer: Self-pay | Admitting: Family

## 2017-01-30 ENCOUNTER — Other Ambulatory Visit: Payer: Self-pay | Admitting: *Deleted

## 2017-01-30 ENCOUNTER — Ambulatory Visit (HOSPITAL_BASED_OUTPATIENT_CLINIC_OR_DEPARTMENT_OTHER)
Admission: RE | Admit: 2017-01-30 | Discharge: 2017-01-30 | Disposition: A | Discharge status: Home / Self Care | Payer: 59 | Source: Ambulatory Visit | Attending: Family | Admitting: Family

## 2017-01-30 ENCOUNTER — Telehealth: Payer: Self-pay | Admitting: Family

## 2017-01-30 ENCOUNTER — Encounter (HOSPITAL_BASED_OUTPATIENT_CLINIC_OR_DEPARTMENT_OTHER): Payer: Self-pay

## 2017-01-30 DIAGNOSIS — E2839 Other primary ovarian failure: Secondary | ICD-10-CM | POA: Diagnosis not present

## 2017-01-30 DIAGNOSIS — Z1231 Encounter for screening mammogram for malignant neoplasm of breast: Secondary | ICD-10-CM | POA: Diagnosis not present

## 2017-01-30 DIAGNOSIS — C180 Malignant neoplasm of cecum: Secondary | ICD-10-CM

## 2017-01-30 DIAGNOSIS — Z0000 Encounter for general adult medical examination without abnormal findings: Secondary | ICD-10-CM

## 2017-01-30 DIAGNOSIS — R9431 Abnormal electrocardiogram [ECG] [EKG]: Secondary | ICD-10-CM

## 2017-01-30 DIAGNOSIS — G629 Polyneuropathy, unspecified: Secondary | ICD-10-CM

## 2017-01-30 DIAGNOSIS — J430 Unilateral pulmonary emphysema [MacLeod's syndrome]: Secondary | ICD-10-CM

## 2017-01-30 MED ORDER — ALPRAZOLAM 1 MG PO TABS: 1.0000 mg | ORAL_TABLET | Freq: Every evening | ORAL | 0 refills | Status: DC | PRN

## 2017-01-30 MED FILL — GABAPENTIN 600 MG TABLET: 600 | 30 days supply | Qty: 120 | Fill #2

## 2017-01-30 MED FILL — ALPRAZolam 1 MG TABS: 1 | 30 days supply | Qty: 30 | Fill #0

## 2017-01-30 NOTE — Telephone Encounter (Signed)
Tried to reach pt left message on voicemail to call back.

## 2017-01-30 NOTE — Telephone Encounter (Signed)
Spoke with pt about results and PCP would like for her to see a cardiologist.  Pt. Voiced understanding.

## 2017-01-30 NOTE — Telephone Encounter (Signed)
Please let pt know that I reviewed her EKG that was performed during her physical and it has a mild electrical conduction abnormality.  I would like for her to see cardiology for consultation.

## 2017-02-03 NOTE — Telephone Encounter (Signed)
Pt has not read mychart message; mailed letter.

## 2017-02-06 ENCOUNTER — Other Ambulatory Visit: Payer: Self-pay | Admitting: *Deleted

## 2017-02-06 DIAGNOSIS — C180 Malignant neoplasm of cecum: Secondary | ICD-10-CM

## 2017-02-06 DIAGNOSIS — J430 Unilateral pulmonary emphysema [MacLeod's syndrome]: Secondary | ICD-10-CM

## 2017-02-06 DIAGNOSIS — G629 Polyneuropathy, unspecified: Secondary | ICD-10-CM

## 2017-02-06 MED ORDER — OXYCODONE-ACETAMINOPHEN 10-325 MG PO TABS: ORAL_TABLET | ORAL | 0 refills | Status: DC

## 2017-02-06 MED FILL — BREO ELLIPTA 100-25 MCG INH: 100-25 | 30 days supply | Qty: 60 | Fill #2

## 2017-02-07 ENCOUNTER — Encounter: Payer: Self-pay | Admitting: *Deleted

## 2017-02-07 MED FILL — OXYCODONE-ACETAMINOPHEN 10-: 10-325 | 10 days supply | Qty: 120 | Fill #0

## 2017-02-20 MED FILL — BENZONATATE 100 MG CAP: 100 | 10 days supply | Qty: 30 | Fill #0

## 2017-02-20 MED FILL — levoFLOXacin 500 MG TABS: 500 | 10 days supply | Qty: 10 | Fill #0

## 2017-02-28 ENCOUNTER — Telehealth: Payer: Self-pay | Admitting: Gastroenterology

## 2017-02-28 NOTE — Telephone Encounter (Signed)
Dr. Ardis Hughs reviewed records and has accepted patient. Ok to schedule [facility]. Left message for patient to return my call.

## 2017-03-01 ENCOUNTER — Other Ambulatory Visit: Payer: Self-pay | Admitting: *Deleted

## 2017-03-01 DIAGNOSIS — C180 Malignant neoplasm of cecum: Secondary | ICD-10-CM

## 2017-03-01 DIAGNOSIS — J430 Unilateral pulmonary emphysema [MacLeod's syndrome]: Secondary | ICD-10-CM

## 2017-03-01 DIAGNOSIS — G629 Polyneuropathy, unspecified: Secondary | ICD-10-CM

## 2017-03-01 MED ORDER — ALPRAZOLAM 1 MG PO TABS
1.0000 mg | ORAL_TABLET | Freq: Every evening | ORAL | 0 refills | Status: DC | PRN
Start: 2017-03-01 — End: 2017-05-01

## 2017-03-01 MED FILL — ALPRAZolam 1 MG TABS: 1 | 30 days supply | Qty: 30 | Fill #0

## 2017-03-07 ENCOUNTER — Other Ambulatory Visit: Payer: Self-pay | Admitting: Family

## 2017-03-07 NOTE — Telephone Encounter (Signed)
Received refill request from [facility] for Trintellix. Pt should still have refills on file at [facility] on [address].  Attempted to verify with pt if she is changing pharmacies and left message for her to check mychart message. Message sent.

## 2017-03-08 ENCOUNTER — Other Ambulatory Visit: Payer: Self-pay | Admitting: *Deleted

## 2017-03-08 DIAGNOSIS — J430 Unilateral pulmonary emphysema [MacLeod's syndrome]: Secondary | ICD-10-CM

## 2017-03-08 DIAGNOSIS — G629 Polyneuropathy, unspecified: Secondary | ICD-10-CM

## 2017-03-08 DIAGNOSIS — C180 Malignant neoplasm of cecum: Secondary | ICD-10-CM

## 2017-03-08 MED ORDER — OXYCODONE-ACETAMINOPHEN 10-325 MG PO TABS: ORAL_TABLET | ORAL | 0 refills | Status: DC

## 2017-03-09 MED FILL — OXYCODONE-ACETAMINOPHEN 10-: 10-325 | 10 days supply | Qty: 120 | Fill #0

## 2017-03-20 ENCOUNTER — Other Ambulatory Visit: Payer: Self-pay | Admitting: Family

## 2017-03-21 NOTE — Telephone Encounter (Signed)
[facility] Last filled - 10/31/16, #30x5 Last AEX - 01/16/17 Next AEX - 6-Mths Refill sent per [facility] refill protocol; fill on or after 04/20/17.SLS 05/08 Note to Pharmacy:  **[facility] ON HOLD, RECEIVED LAST RX AT [facility] ON 03/20/17**

## 2017-03-29 MED FILL — BREO ELLIPTA 100-25 MCG INH: 100-25 | 30 days supply | Qty: 60 | Fill #3

## 2017-03-29 MED FILL — GABAPENTIN 600 MG TABLET: 600 | 30 days supply | Qty: 120 | Fill #3

## 2017-03-29 MED FILL — ALPRAZolam 1 MG TABS: 1 | 30 days supply | Qty: 30 | Fill #0

## 2017-03-31 NOTE — Telephone Encounter (Signed)
Patient has not returned phone call. Records will be in "records reviewed" folder.

## 2017-04-03 ENCOUNTER — Other Ambulatory Visit: Payer: Self-pay | Admitting: *Deleted

## 2017-04-03 DIAGNOSIS — G629 Polyneuropathy, unspecified: Secondary | ICD-10-CM

## 2017-04-03 DIAGNOSIS — C180 Malignant neoplasm of cecum: Secondary | ICD-10-CM

## 2017-04-03 DIAGNOSIS — J430 Unilateral pulmonary emphysema [MacLeod's syndrome]: Secondary | ICD-10-CM

## 2017-04-03 MED ORDER — OXYCODONE-ACETAMINOPHEN 10-325 MG PO TABS: ORAL_TABLET | ORAL | 0 refills | Status: DC

## 2017-04-04 MED FILL — OXYCODONE-ACETAMINOPHEN 10-: 10-325 | 10 days supply | Qty: 120 | Fill #0

## 2017-05-01 ENCOUNTER — Other Ambulatory Visit: Payer: Self-pay | Admitting: *Deleted

## 2017-05-01 DIAGNOSIS — J430 Unilateral pulmonary emphysema [MacLeod's syndrome]: Secondary | ICD-10-CM

## 2017-05-01 DIAGNOSIS — C180 Malignant neoplasm of cecum: Secondary | ICD-10-CM

## 2017-05-01 DIAGNOSIS — G629 Polyneuropathy, unspecified: Secondary | ICD-10-CM

## 2017-05-01 MED ORDER — ALPRAZOLAM 1 MG PO TABS
1.0000 mg | ORAL_TABLET | Freq: Every evening | ORAL | 0 refills | Status: DC | PRN
Start: 2017-05-01 — End: 2017-06-02

## 2017-05-01 MED FILL — ALPRAZolam 1 MG TABS: 1 | 30 days supply | Qty: 30 | Fill #0

## 2017-05-08 ENCOUNTER — Other Ambulatory Visit: Payer: Self-pay | Admitting: *Deleted

## 2017-05-08 DIAGNOSIS — G629 Polyneuropathy, unspecified: Secondary | ICD-10-CM

## 2017-05-08 DIAGNOSIS — J430 Unilateral pulmonary emphysema [MacLeod's syndrome]: Secondary | ICD-10-CM

## 2017-05-08 DIAGNOSIS — C180 Malignant neoplasm of cecum: Secondary | ICD-10-CM

## 2017-05-08 MED ORDER — OXYCODONE-ACETAMINOPHEN 10-325 MG PO TABS: ORAL_TABLET | ORAL | 0 refills | Status: DC

## 2017-05-08 MED FILL — OXYCODONE-ACETAMINOPHEN 10-: 10-325 | 10 days supply | Qty: 120 | Fill #0

## 2017-05-22 MED FILL — GABAPENTIN 600 MG TABLET: 600 | 30 days supply | Qty: 120 | Fill #2

## 2017-05-29 ENCOUNTER — Other Ambulatory Visit: Payer: Self-pay | Admitting: *Deleted

## 2017-06-02 ENCOUNTER — Other Ambulatory Visit: Payer: Self-pay | Admitting: *Deleted

## 2017-06-02 DIAGNOSIS — C180 Malignant neoplasm of cecum: Secondary | ICD-10-CM

## 2017-06-02 DIAGNOSIS — J430 Unilateral pulmonary emphysema [MacLeod's syndrome]: Secondary | ICD-10-CM

## 2017-06-02 DIAGNOSIS — G629 Polyneuropathy, unspecified: Secondary | ICD-10-CM

## 2017-06-02 MED ORDER — OXYCODONE-ACETAMINOPHEN 10-325 MG PO TABS: ORAL_TABLET | ORAL | 0 refills | Status: DC

## 2017-06-02 MED ORDER — ALPRAZOLAM 1 MG PO TABS
1.0000 mg | ORAL_TABLET | Freq: Every evening | ORAL | 0 refills | Status: DC | PRN
Start: 2017-06-02 — End: 2017-06-28

## 2017-06-02 MED FILL — BREO ELLIPTA 100-25 MCG INH: 100-25 | 30 days supply | Qty: 60 | Fill #4

## 2017-06-02 MED FILL — ALPRAZolam 1 MG TABS: 1 | 30 days supply | Qty: 30 | Fill #0

## 2017-06-05 MED FILL — OXYCODONE-APAP 10-325 MG TA: 10-325 | 10 days supply | Qty: 120 | Fill #0

## 2017-06-28 ENCOUNTER — Other Ambulatory Visit: Payer: Self-pay | Admitting: *Deleted

## 2017-06-28 DIAGNOSIS — C180 Malignant neoplasm of cecum: Secondary | ICD-10-CM

## 2017-06-28 DIAGNOSIS — J430 Unilateral pulmonary emphysema [MacLeod's syndrome]: Secondary | ICD-10-CM

## 2017-06-28 DIAGNOSIS — G629 Polyneuropathy, unspecified: Secondary | ICD-10-CM

## 2017-06-28 MED ORDER — ALPRAZOLAM 1 MG PO TABS
1.0000 mg | ORAL_TABLET | Freq: Every evening | ORAL | 2 refills | Status: DC | PRN
Start: 2017-06-28 — End: 2017-09-25

## 2017-07-03 ENCOUNTER — Other Ambulatory Visit: Payer: Self-pay | Admitting: *Deleted

## 2017-07-03 DIAGNOSIS — J430 Unilateral pulmonary emphysema [MacLeod's syndrome]: Secondary | ICD-10-CM

## 2017-07-03 DIAGNOSIS — G629 Polyneuropathy, unspecified: Secondary | ICD-10-CM

## 2017-07-03 DIAGNOSIS — C180 Malignant neoplasm of cecum: Secondary | ICD-10-CM

## 2017-07-03 MED ORDER — OXYCODONE-ACETAMINOPHEN 10-325 MG PO TABS: ORAL_TABLET | ORAL | 0 refills | Status: DC

## 2017-07-04 MED FILL — OXYCODONE-APAP 10-325 MG TA: 10-325 | 10 days supply | Qty: 120 | Fill #0

## 2017-07-24 ENCOUNTER — Ambulatory Visit (HOSPITAL_BASED_OUTPATIENT_CLINIC_OR_DEPARTMENT_OTHER): Payer: 59 | Admitting: Family

## 2017-07-24 ENCOUNTER — Ambulatory Visit (INDEPENDENT_AMBULATORY_CARE_PROVIDER_SITE_OTHER): Payer: 59 | Admitting: Family

## 2017-07-24 ENCOUNTER — Telehealth: Payer: Self-pay | Admitting: Family

## 2017-07-24 ENCOUNTER — Other Ambulatory Visit: Payer: Self-pay

## 2017-07-24 ENCOUNTER — Ambulatory Visit: Payer: Self-pay | Admitting: Hematology & Oncology

## 2017-07-24 ENCOUNTER — Encounter: Payer: Self-pay | Admitting: Family

## 2017-07-24 ENCOUNTER — Other Ambulatory Visit (HOSPITAL_BASED_OUTPATIENT_CLINIC_OR_DEPARTMENT_OTHER): Payer: 59

## 2017-07-24 VITALS — BP 106/58 | HR 88 | Temp 98.2°F | Resp 16 | Ht 67.0 in | Wt 157.4 lb

## 2017-07-24 DIAGNOSIS — J430 Unilateral pulmonary emphysema [MacLeod's syndrome]: Secondary | ICD-10-CM

## 2017-07-24 DIAGNOSIS — F419 Anxiety disorder, unspecified: Secondary | ICD-10-CM

## 2017-07-24 DIAGNOSIS — C180 Malignant neoplasm of cecum: Secondary | ICD-10-CM

## 2017-07-24 DIAGNOSIS — J449 Chronic obstructive pulmonary disease, unspecified: Secondary | ICD-10-CM

## 2017-07-24 DIAGNOSIS — Z23 Encounter for immunization: Secondary | ICD-10-CM | POA: Diagnosis not present

## 2017-07-24 DIAGNOSIS — Z85038 Personal history of other malignant neoplasm of large intestine: Secondary | ICD-10-CM

## 2017-07-24 DIAGNOSIS — F329 Major depressive disorder, single episode, unspecified: Secondary | ICD-10-CM | POA: Diagnosis not present

## 2017-07-24 DIAGNOSIS — G629 Polyneuropathy, unspecified: Secondary | ICD-10-CM

## 2017-07-24 LAB — CBC WITH DIFFERENTIAL (CANCER CENTER ONLY)
BASO#: 0 10*3/uL (ref 0.0–0.2)
BASO%: 0.1 % (ref 0.0–2.0)
EOS ABS: 0.2 10*3/uL (ref 0.0–0.5)
EOS%: 2.2 % (ref 0.0–7.0)
HCT: 41.8 % (ref 34.8–46.6)
HGB: 13.7 g/dL (ref 11.6–15.9)
LYMPH#: 1.8 10*3/uL (ref 0.9–3.3)
LYMPH%: 21.4 % (ref 14.0–48.0)
MCH: 23.4 pg — AB (ref 26.0–34.0)
MCHC: 32.8 g/dL (ref 32.0–36.0)
MCV: 72 fL — AB (ref 81–101)
MONO#: 0.4 10*3/uL (ref 0.1–0.9)
MONO%: 4.7 % (ref 0.0–13.0)
NEUT#: 6.1 10*3/uL (ref 1.5–6.5)
NEUT%: 71.6 % (ref 39.6–80.0)
PLATELETS: 145 10*3/uL (ref 145–400)
RBC: 5.85 10*6/uL — AB (ref 3.70–5.32)
RDW: 15.9 % — ABNORMAL HIGH (ref 11.1–15.7)
WBC: 8.6 10*3/uL (ref 3.9–10.0)

## 2017-07-24 LAB — CMP (CANCER CENTER ONLY)
ALK PHOS: 81 U/L (ref 26–84)
ALT: 20 U/L (ref 10–47)
AST: 18 U/L (ref 11–38)
Albumin: 3.6 g/dL (ref 3.3–5.5)
BILIRUBIN TOTAL: 0.7 mg/dL (ref 0.20–1.60)
BUN: 8 mg/dL (ref 7–22)
CO2: 29 meq/L (ref 18–33)
Calcium: 9 mg/dL (ref 8.0–10.3)
Chloride: 103 mEq/L (ref 98–108)
Creat: 0.9 mg/dl (ref 0.6–1.2)
GLUCOSE: 123 mg/dL — AB (ref 73–118)
POTASSIUM: 3.3 meq/L (ref 3.3–4.7)
Sodium: 141 mEq/L (ref 128–145)
Total Protein: 7 g/dL (ref 6.4–8.1)

## 2017-07-24 LAB — LACTATE DEHYDROGENASE: LDH: 209 U/L (ref 125–245)

## 2017-07-24 MED ORDER — GABAPENTIN 600 MG PO TABS: ORAL_TABLET | ORAL | 3 refills | Status: DC

## 2017-07-24 MED ORDER — VORTIOXETINE HBR 5 MG PO TABS: 1.0000 | ORAL_TABLET | Freq: Every day | ORAL | 5 refills | Status: DC

## 2017-07-24 MED FILL — TRINTELLIX 5 MG TABLET: 5 | 30 days supply | Qty: 30 | Fill #0

## 2017-07-24 MED FILL — GABAPENTIN 600 MG TABLET: 600 | 30 days supply | Qty: 120 | Fill #0

## 2017-07-24 NOTE — Addendum Note (Signed)
Addended by: Kelle Darting A on: 07/24/2017 01:42 PM   Modules accepted: Orders

## 2017-07-24 NOTE — Telephone Encounter (Signed)
Opened in error

## 2017-07-24 NOTE — Progress Notes (Signed)
Hematology and Oncology Follow Up Visit  Teresa Morgan [id number] Apr 10, 1956 61 y.o. 07/24/2017   Principle Diagnosis:  1. Stage IIIB (T2 N2 M0) adenocarcinoma of the cecum 2. Neuropathy secondary to chemotherapy  Current Therapy:   Observation    Interim History:  Teresa Morgan is here today for follow-up. She is doing well and has no new complaints at this time. Her CEA in March was 4.88.  The neuropathy in her hands and feet is unchanged. She states that this has not effected her gait or dexterity. She denies any falls of syncopal episodes.  She has had no abdominal pain or changes in bowel habits. No episodes of bleeding or bruising.  She has COPD and will occasionally have SOB with over exertion and a dry cough.  She has had no fever, chills, n/v, rash, dizziness, chest pain, palpitations or changes in bladder habits.  No swelling in her extremities. She has arthritic pain in her lower back and sciatica down the right leg that comes and goes. She wears a back brace as needed which helps with support and reduces discomfort.  She has maintained a good appetite and is staying well hydrated. Her weight is stable.   ECOG Performance Status: 0 - Asymptomatic  Medications:  Allergies as of 07/24/2017      Reactions   Aspirin    Upset stomach   Cymbalta [duloxetine Hcl] Swelling   Swollen lips    Prozac [fluoxetine Hcl] Swelling   Lip swelling   Wellbutrin [bupropion] Swelling   Lip swelling      Medication List       Accurate as of 07/24/17 11:58 AM. Always use your most recent med list.          ALPRAZolam 1 MG tablet Commonly known as:  XANAX Take 1 tablet (1 mg total) by mouth at bedtime as needed for anxiety or sleep.   Calcium-Vitamin D 600-125 MG-UNIT Tabs Take 1 tablet by mouth daily.   fluticasone furoate-vilanterol 100-25 MCG/INH Aepb Commonly known as:  BREO ELLIPTA INHALE 1 PUFF BY MOUTH EVERY DAY   gabapentin 600 MG tablet Commonly known as:   NEURONTIN TAKE 1 TABLET (600 MG TOTAL) BY MOUTH 4 (FOUR) TIMES DAILY.   multivitamin tablet Take 1 tablet by mouth daily.   oxyCODONE-acetaminophen 10-325 MG tablet Commonly known as:  PERCOCET 1 - 2 tabs every 4 hours as needed for pain   polyethylene glycol powder powder Commonly known as:  GLYCOLAX/MIRALAX Take 17 g by mouth 2 (two) times daily as needed.   TURMERIC PO Take 1 tablet by mouth daily.   vitamin B-6 250 MG tablet Take 1 tablet (250 mg total) by mouth daily.   vitamin E 200 UNIT capsule Take 200 Units by mouth daily.   vortioxetine HBr 5 MG Tabs Commonly known as:  TRINTELLIX Take 1 tablet (5 mg total) by mouth daily. Fill on or after 04/20/17       Allergies:  Allergies  Allergen Reactions  . Aspirin     Upset stomach  . Cymbalta [Duloxetine Hcl] Swelling    Swollen lips   . Prozac [Fluoxetine Hcl] Swelling    Lip swelling  . Wellbutrin [Bupropion] Swelling    Lip swelling    Past Medical History, Surgical history, Social history, and Family History were reviewed and updated.  Review of Systems: All other 10 point review of systems is negative.   Physical Exam:  vitals were not taken for this visit.  Wt Readings  from Last 3 Encounters:  01/16/17 154 lb 8 oz (70.1 kg)  10/31/16 155 lb 6.4 oz (70.5 kg)  08/15/16 155 lb 6.4 oz (70.5 kg)    Ocular: Sclerae unicteric, pupils equal, round and reactive to light Ear-nose-throat: Oropharynx clear, dentition fair Lymphatic: No cervical, supraclavicular or axillary adenopathy Lungs no rales or rhonchi, good excursion bilaterally Heart regular rate and rhythm, no murmur appreciated Abd soft, nontender, positive bowel sounds, no liver or spleen tip palpated on exam, no fluid wave MSK no focal spinal tenderness, no joint edema Neuro: non-focal, well-oriented, appropriate affect Breasts: Deferred   Lab Results  Component Value Date   WBC 8.6 07/24/2017   HGB 13.7 07/24/2017   HCT 41.8 07/24/2017    MCV 72 (L) 07/24/2017   PLT 145 07/24/2017   Lab Results  Component Value Date   FERRITIN 227 05/07/2012   IRON 114 03/11/2015   TIBC 246 (L) 05/07/2012   UIBC 137 05/07/2012   IRONPCTSAT 44 05/07/2012   Lab Results  Component Value Date   RBC 5.85 (H) 07/24/2017   No results found for: KPAFRELGTCHN, LAMBDASER, KAPLAMBRATIO No results found for: Kandis Cocking, IGMSERUM No results found for: Odetta Pink, SPEI   Chemistry      Component Value Date/Time   NA 142 01/18/2017 1313   NA 142 09/21/2015 1306   K 3.5 01/18/2017 1313   K 3.8 09/21/2015 1306   CL 105 01/18/2017 1313   CO2 24 01/18/2017 1313   CO2 27 09/21/2015 1306   BUN 10 01/18/2017 1313   BUN 9.8 09/21/2015 1306   CREATININE 1.0 01/18/2017 1313   CREATININE 0.8 09/21/2015 1306      Component Value Date/Time   CALCIUM 9.3 01/18/2017 1313   CALCIUM 9.2 09/21/2015 1306   ALKPHOS 62 01/18/2017 1313   ALKPHOS 77 09/21/2015 1306   AST 19 01/18/2017 1313   AST 15 09/21/2015 1306   ALT 18 01/18/2017 1313   ALT 13 09/21/2015 1306   BILITOT 0.70 01/18/2017 1313   BILITOT 0.40 09/21/2015 1306      Impression and Plan: Teresa Morgan is a very pleasant 57 to female with history of stage IIIB colon cancer of the cecum diagnosed in September 2012. She had a resection and 4 positive lymph nodes. She then completed chemotherapy in March 2013. She is doing well and so far there has been no evidence of recurrence. She continues to do well and has no complaints at this time. CEA is pending. Last result was 4.88.  We will continue to follow along with her and plan to see her back again in another 6 months.  She will contact our office with any questions or concerns. We can certainly see her sooner if need be and perform scans if she becomes symptomatic.   Eliezer Bottom, NP 9/10/201811:58 AM

## 2017-07-24 NOTE — Progress Notes (Signed)
Subjective:    Patient ID: Teresa Morgan, female    DOB: 11/23/1955, 61 y.o.   MRN: [id number]  HPI  Teresa Morgan is a 61 yr old female who presents today for follow up of her anxiety/depression. Reports that she uses xanax prn sleep. Reports mood is good, no panic attacks or crying spells.     Review of Systems See HPI  Past Medical History:  Diagnosis Date  . Anemia   . Anxiety   . Arthritis   . Colon cancer ([place])   . COPD with emphysema ([place]) 06/18/2012  . Depression   . GERD (gastroesophageal reflux disease)   . Peptic ulcer   . Pneumonia   . Shortness of breath dyspnea    with exertion     Social History   Social History  . Marital status: Married    Spouse name: N/A  . Number of children: 3  . Years of education: N/A   Occupational History  . 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 Smoking status: Current Every Day Smoker    Packs/day: 1.00    Years: 20.00    Types: Cigarettes    Start date: 04/23/1986  . Smokeless tobacco: Never Used  . Alcohol use 0.0 oz/week     Comment: social  . Drug use: No  . Sexual activity: Not on file   Other Topics Concern  . 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 APPENDECTOMY  07/07/11  . COLON RESECTION  2012   for colon cancer  . COLONOSCOPY    . EYE SURGERY Bilateral    cataract surgery with lens implant  . heel spurs Right   . hemrrhoids    . PLANTAR FASCIA SURGERY Right 2009  . PORT-A-CATH REMOVAL Left 04/19/2016   Procedure: REMOVAL PORT-A-CATH;  Surgeon: [name], MD;  Location: [facility];  Service: General;  Laterality: Left;  . portacath    . TUBAL LIGATION  07/1980    Family History  Problem Relation Age of Onset  . Diabetes Mother   . Colon cancer Cousin     Allergies  Allergen Reactions  . Aspirin     Upset stomach  . Cymbalta  [Duloxetine Hcl] Swelling    Swollen lips   . Prozac [Fluoxetine Hcl] Swelling    Lip swelling  . Wellbutrin [Bupropion] Swelling    Lip swelling    Current Outpatient Prescriptions on File Prior to Visit  Medication Sig Dispense Refill  . ALPRAZolam (XANAX) 1 MG tablet Take 1 tablet (1 mg total) by mouth at bedtime as needed for anxiety or sleep. 30 tablet 2  . Calcium-Vitamin D 600-125 MG-UNIT TABS Take 1 tablet by mouth daily.     . fluticasone furoate-vilanterol (BREO ELLIPTA) 100-25 MCG/INH AEPB INHALE 1 PUFF BY MOUTH EVERY DAY 60 each 5  . Multiple Vitamin (MULTIVITAMIN) tablet Take 1 tablet by mouth daily.      Marland Kitchen oxyCODONE-acetaminophen (PERCOCET) 10-325 MG tablet 1 - 2 tabs every 4 hours as needed for pain 120 tablet 0  . polyethylene glycol powder (GLYCOLAX/MIRALAX) powder Take 17 g by mouth 2 (two) times daily as needed. (Patient taking differently: Take 17 g by mouth 2 (two) times daily as needed for moderate constipation. )  3350 g 1  . Pyridoxine HCl (VITAMIN B-6) 250 MG tablet Take 1 tablet (250 mg total) by mouth daily. 30 tablet 12  . TURMERIC PO Take 1 tablet by mouth daily.     . vitamin E 200 UNIT capsule Take 200 Units by mouth daily.      Marland Kitchen vortioxetine HBr (TRINTELLIX) 5 MG TABS Take 1 tablet (5 mg total) by mouth daily. Fill on or after 04/20/17 30 tablet 3   No current facility-administered medications on file prior to visit.     BP (!) 106/58 (BP Location: Right Arm, Cuff Size: Normal)   Pulse 88   Temp 98.2 F (36.8 C) (Oral)   Resp 16   Ht 5\' 7"  (1.702 m)   Wt 157 lb 6.4 oz (71.4 kg)   SpO2 97%   BMI 24.65 kg/m       Objective:   Physical Exam  Constitutional: She is oriented to person, place, and time. She appears well-developed and well-nourished.  Cardiovascular: Normal rate, regular rhythm and normal heart sounds.   No murmur heard. Pulmonary/Chest: Effort normal and breath sounds normal. No respiratory distress. She has no wheezes.    Neurological: She is alert and oriented to person, place, and time.  Psychiatric: She has a normal mood and affect. Her behavior is normal. Judgment and thought content normal.          Assessment & Plan:

## 2017-07-24 NOTE — Assessment & Plan Note (Signed)
Stable on trintellix. Continue same.

## 2017-07-25 LAB — CEA (IN HOUSE-CHCC): CEA (CHCC-In House): 4.88 ng/mL (ref 0.00–5.00)

## 2017-07-28 ENCOUNTER — Other Ambulatory Visit: Payer: Self-pay | Admitting: *Deleted

## 2017-08-03 ENCOUNTER — Other Ambulatory Visit: Payer: Self-pay | Admitting: *Deleted

## 2017-08-03 DIAGNOSIS — G629 Polyneuropathy, unspecified: Secondary | ICD-10-CM

## 2017-08-03 DIAGNOSIS — J430 Unilateral pulmonary emphysema [MacLeod's syndrome]: Secondary | ICD-10-CM

## 2017-08-03 DIAGNOSIS — C180 Malignant neoplasm of cecum: Secondary | ICD-10-CM

## 2017-08-03 MED ORDER — OXYCODONE-ACETAMINOPHEN 10-325 MG PO TABS: ORAL_TABLET | ORAL | 0 refills | Status: DC

## 2017-08-03 MED FILL — BREO ELLIPTA 100-25 MCG INH: 100-25 | 30 days supply | Qty: 60 | Fill #5

## 2017-08-04 MED FILL — OXYCODONE-APAP 10-325 MG TA: 10-325 | 10 days supply | Qty: 120 | Fill #0

## 2017-08-22 MED FILL — TRINTELLIX 5 MG TABLET: 5 | 30 days supply | Qty: 30 | Fill #1

## 2017-09-01 ENCOUNTER — Other Ambulatory Visit: Payer: Self-pay | Admitting: *Deleted

## 2017-09-01 DIAGNOSIS — J430 Unilateral pulmonary emphysema [MacLeod's syndrome]: Secondary | ICD-10-CM

## 2017-09-01 DIAGNOSIS — G629 Polyneuropathy, unspecified: Secondary | ICD-10-CM

## 2017-09-01 DIAGNOSIS — C180 Malignant neoplasm of cecum: Secondary | ICD-10-CM

## 2017-09-01 MED ORDER — OXYCODONE-ACETAMINOPHEN 10-325 MG PO TABS: ORAL_TABLET | ORAL | 0 refills | Status: DC

## 2017-09-04 MED FILL — OXYCODONE-APAP 10-325 MG TA: 10-325 | 10 days supply | Qty: 120 | Fill #0

## 2017-09-22 MED FILL — TRINTELLIX 5 MG TABLET: 5 | 30 days supply | Qty: 30 | Fill #2

## 2017-09-22 MED FILL — GABAPENTIN 600 MG TABS: 600 | 30 days supply | Qty: 120 | Fill #1

## 2017-09-25 ENCOUNTER — Other Ambulatory Visit: Payer: Self-pay | Admitting: *Deleted

## 2017-09-25 DIAGNOSIS — J430 Unilateral pulmonary emphysema [MacLeod's syndrome]: Secondary | ICD-10-CM

## 2017-09-25 DIAGNOSIS — G629 Polyneuropathy, unspecified: Secondary | ICD-10-CM

## 2017-09-25 DIAGNOSIS — C180 Malignant neoplasm of cecum: Secondary | ICD-10-CM

## 2017-09-25 MED ORDER — ALPRAZOLAM 1 MG PO TABS: 1.0000 mg | ORAL_TABLET | Freq: Every evening | ORAL | 2 refills | Status: DC | PRN

## 2017-09-28 MED FILL — ALPRAZolam 1 MG TABS: 1 | 30 days supply | Qty: 30 | Fill #0

## 2017-10-02 ENCOUNTER — Other Ambulatory Visit: Payer: Self-pay | Admitting: *Deleted

## 2017-10-02 DIAGNOSIS — C180 Malignant neoplasm of cecum: Secondary | ICD-10-CM

## 2017-10-02 DIAGNOSIS — J430 Unilateral pulmonary emphysema [MacLeod's syndrome]: Secondary | ICD-10-CM

## 2017-10-02 DIAGNOSIS — G629 Polyneuropathy, unspecified: Secondary | ICD-10-CM

## 2017-10-02 MED ORDER — OXYCODONE-ACETAMINOPHEN 10-325 MG PO TABS: ORAL_TABLET | ORAL | 0 refills | Status: DC

## 2017-10-03 ENCOUNTER — Other Ambulatory Visit: Payer: Self-pay | Admitting: Pulmonary Disease

## 2017-10-03 ENCOUNTER — Other Ambulatory Visit: Payer: Self-pay | Admitting: *Deleted

## 2017-10-03 DIAGNOSIS — J430 Unilateral pulmonary emphysema [MacLeod's syndrome]: Secondary | ICD-10-CM

## 2017-10-03 DIAGNOSIS — C180 Malignant neoplasm of cecum: Secondary | ICD-10-CM

## 2017-10-03 DIAGNOSIS — G629 Polyneuropathy, unspecified: Secondary | ICD-10-CM

## 2017-10-03 MED ORDER — OXYCODONE-ACETAMINOPHEN 10-325 MG PO TABS: ORAL_TABLET | ORAL | 0 refills | Status: DC

## 2017-10-03 MED FILL — OXYCODONE-APAP 10-325 MG TA: 10-325 | 10 days supply | Qty: 120 | Fill #0

## 2017-10-03 MED FILL — BREO ELLIPTA 100-25 MCG INH: 100-25 | 60 days supply | Qty: 60 | Fill #0

## 2017-10-25 MED FILL — TRINTELLIX 5 MG TABLET: 5 | 30 days supply | Qty: 30 | Fill #3

## 2017-10-26 MED FILL — ALPRAZolam 1 MG TABS: 1 | 30 days supply | Qty: 30 | Fill #1

## 2017-10-30 ENCOUNTER — Other Ambulatory Visit: Payer: Self-pay | Admitting: *Deleted

## 2017-10-30 DIAGNOSIS — C180 Malignant neoplasm of cecum: Secondary | ICD-10-CM

## 2017-10-30 DIAGNOSIS — G629 Polyneuropathy, unspecified: Secondary | ICD-10-CM

## 2017-10-30 DIAGNOSIS — J430 Unilateral pulmonary emphysema [MacLeod's syndrome]: Secondary | ICD-10-CM

## 2017-10-30 MED ORDER — OXYCODONE-ACETAMINOPHEN 10-325 MG PO TABS: ORAL_TABLET | ORAL | 0 refills | Status: DC

## 2017-10-30 MED FILL — OXYCODONE-APAP 10-325 MG TA: 10-325 | 10 days supply | Qty: 120 | Fill #0

## 2017-11-23 ENCOUNTER — Other Ambulatory Visit: Payer: Self-pay | Admitting: Pulmonary Disease

## 2017-11-23 MED FILL — GABAPENTIN 600 MG TABS: 600 | 30 days supply | Qty: 120 | Fill #2

## 2017-11-23 MED FILL — TRINTELLIX 5 MG TABLET: 5 | 30 days supply | Qty: 30 | Fill #4

## 2017-11-27 ENCOUNTER — Telehealth: Payer: Self-pay | Admitting: Pharmacy Technician

## 2017-11-27 ENCOUNTER — Other Ambulatory Visit: Payer: Self-pay | Admitting: *Deleted

## 2017-11-27 DIAGNOSIS — C180 Malignant neoplasm of cecum: Secondary | ICD-10-CM

## 2017-11-27 DIAGNOSIS — J430 Unilateral pulmonary emphysema [MacLeod's syndrome]: Secondary | ICD-10-CM

## 2017-11-27 DIAGNOSIS — G629 Polyneuropathy, unspecified: Secondary | ICD-10-CM

## 2017-11-27 MED ORDER — FLUTICASONE FUROATE-VILANTEROL 100-25 MCG/INH IN AEPB: INHALATION_SPRAY | RESPIRATORY_TRACT | 0 refills | Status: DC

## 2017-11-27 MED ORDER — OXYCODONE-ACETAMINOPHEN 10-325 MG PO TABS: ORAL_TABLET | ORAL | 0 refills | Status: DC

## 2017-11-27 MED FILL — ALPRAZolam 1 MG TABS: 1 | 30 days supply | Qty: 30 | Fill #2

## 2017-11-27 MED FILL — OXYCODONE-APAP 10-325 MG TA: 10-325 | 10 days supply | Qty: 120 | Fill #0

## 2017-11-27 MED FILL — BREO ELLIPTA 100-25 MCG INH: 100-25 | 30 days supply | Qty: 60 | Fill #0

## 2017-11-27 NOTE — Telephone Encounter (Signed)
Spoke with pt. She is needing a refill on Breo. Pt has a pending OV with RA on 12/27/17. 30 day supply has been sent in. Nothing further was needed.

## 2017-12-06 MED FILL — VIRTUSSIN AC LIQUID: 100-10 | 6 days supply | Qty: 240 | Fill #0

## 2017-12-06 MED FILL — AZELASTINE HCL 137 MCG SPRY: 0.1 | 50 days supply | Qty: 30 | Fill #0

## 2017-12-06 MED FILL — predniSONE 20 MG TABS: 20 | 5 days supply | Qty: 15 | Fill #0

## 2017-12-06 MED FILL — AMOX-CLAV 875-125 MG TABLET: 875-125 | 10 days supply | Qty: 20 | Fill #0

## 2017-12-07 MED FILL — VIRTUSSIN AC LIQUID: 100-10 | 6 days supply | Qty: 240 | Fill #0

## 2017-12-25 ENCOUNTER — Other Ambulatory Visit: Payer: Self-pay | Admitting: *Deleted

## 2017-12-25 DIAGNOSIS — C180 Malignant neoplasm of cecum: Secondary | ICD-10-CM

## 2017-12-25 DIAGNOSIS — J430 Unilateral pulmonary emphysema [MacLeod's syndrome]: Secondary | ICD-10-CM

## 2017-12-25 DIAGNOSIS — G629 Polyneuropathy, unspecified: Secondary | ICD-10-CM

## 2017-12-25 MED ORDER — ALPRAZOLAM 1 MG PO TABS: 1.0000 mg | ORAL_TABLET | Freq: Every evening | ORAL | 2 refills | Status: DC | PRN

## 2017-12-25 MED FILL — ALPRAZolam 1 MG TABS: 1 | 30 days supply | Qty: 30 | Fill #0

## 2017-12-25 MED FILL — TRINTELLIX 5 MG TABLET: 5 | 30 days supply | Qty: 30 | Fill #5

## 2017-12-26 ENCOUNTER — Other Ambulatory Visit: Payer: Self-pay | Admitting: *Deleted

## 2017-12-26 DIAGNOSIS — G629 Polyneuropathy, unspecified: Secondary | ICD-10-CM

## 2017-12-26 DIAGNOSIS — J430 Unilateral pulmonary emphysema [MacLeod's syndrome]: Secondary | ICD-10-CM

## 2017-12-26 DIAGNOSIS — C180 Malignant neoplasm of cecum: Secondary | ICD-10-CM

## 2017-12-26 MED ORDER — OXYCODONE-ACETAMINOPHEN 10-325 MG PO TABS: ORAL_TABLET | ORAL | 0 refills | Status: DC

## 2017-12-26 MED FILL — OXYCODONE-APAP 10-325: 10-325 | 10 days supply | Qty: 120 | Fill #0

## 2017-12-27 ENCOUNTER — Ambulatory Visit (INDEPENDENT_AMBULATORY_CARE_PROVIDER_SITE_OTHER): Payer: 59 | Admitting: Pulmonary Disease

## 2017-12-27 ENCOUNTER — Encounter: Payer: Self-pay | Admitting: Pulmonary Disease

## 2017-12-27 VITALS — BP 122/70 | HR 92 | Ht 67.0 in | Wt 152.0 lb

## 2017-12-27 DIAGNOSIS — Z720 Tobacco use: Secondary | ICD-10-CM | POA: Diagnosis not present

## 2017-12-27 DIAGNOSIS — J439 Emphysema, unspecified: Secondary | ICD-10-CM | POA: Diagnosis not present

## 2017-12-27 DIAGNOSIS — Z87891 Personal history of nicotine dependence: Secondary | ICD-10-CM | POA: Insufficient documentation

## 2017-12-27 LAB — PULMONARY FUNCTION TEST
FEF 25-75 Pre: 1.45 L/sec
FEF2575-%PRED-PRE: 58 %
FEV1-%Pred-Pre: 77 %
FEV1-PRE: 2.23 L
FEV1FVC-%Pred-Pre: 90 %
FEV6-%PRED-PRE: 89 %
FEV6-Pre: 3.21 L
FEV6FVC-%Pred-Pre: 103 %
FVC-%PRED-PRE: 85 %
FVC-PRE: 3.21 L
PRE FEV6/FVC RATIO: 100 %
Pre FEV1/FVC ratio: 69 %

## 2017-12-27 NOTE — Assessment & Plan Note (Signed)
Smoking cessation again emphasized of the most important intervention here. She will start with nicotine patch 21 mg daily. She will call back for prescription for Chantix if she is able to commit to a quit date

## 2017-12-27 NOTE — Patient Instructions (Signed)
Refills on Breo Smoking cessation

## 2017-12-27 NOTE — Progress Notes (Signed)
   Subjective:    Patient ID: Teresa Morgan, female    DOB: 07-Jun-1956, 62 y.o.   MRN: [id number]  HPI  62 yo smoker with GOLD B COPD/Emphysema.  Hx of Cecum adenocarcinoma Stage III B f/by Dr. Martha Clan. S/p resection and chemo 2012-2013  She works at the [employer] and [employer]  and is thinking of retiring this year Continues to smoke up to a pack a day.  Breathing has been stable. She recently had an attack of sinobronchitis and was given Augmentin, short course of prednisone and codeine cough syrup.  Still has occasional dry cough but breathing is improved and back to baseline.  Spirometry today remarkably shows mild airway obstruction with ratio 69 and FEV1 77%   Significant tests/ events reviewed  CT chest in 2015 with tiny nodules .  CT chest in 01/2016 with stable right lung nodules , additional nodules improved or resolved.    Review of Systems Patient denies significant dyspnea,cough, hemoptysis,  chest pain, palpitations, pedal edema, orthopnea, paroxysmal nocturnal dyspnea, lightheadedness, nausea, vomiting, abdominal or  leg pains      Objective:   Physical Exam  Gen. Pleasant, well-nourished, in no distress ENT - no thrush, no post nasal drip Neck: No JVD, no thyromegaly, no carotid bruits Lungs: no use of accessory muscles, no dullness to percussion, clear without rales or rhonchi  Cardiovascular: Rhythm regular, heart sounds  normal, no murmurs or gallops, no peripheral edema Musculoskeletal: No deformities, no cyanosis or clubbing         Assessment & Plan:

## 2017-12-27 NOTE — Assessment & Plan Note (Signed)
Surprisingly lung function is not bad. Continue on breo but wonder if she would needs this especially if she is able to quit smoking

## 2017-12-27 NOTE — Progress Notes (Signed)
PFT completed today.  

## 2018-01-10 MED FILL — HYDROMET SYRUP: 5-1.5 | 6 days supply | Qty: 120 | Fill #0

## 2018-01-10 MED FILL — DOXYCYCLINE HYCLATE 100 MG: 100 | 10 days supply | Qty: 20 | Fill #0

## 2018-01-10 MED FILL — predniSONE 20 MG TABS: 20 | 5 days supply | Qty: 10 | Fill #0

## 2018-01-15 ENCOUNTER — Other Ambulatory Visit: Payer: Self-pay | Admitting: Pulmonary Disease

## 2018-01-15 MED FILL — BREO ELLIPTA 100-25 MCG INH: 100-25 | 30 days supply | Qty: 60 | Fill #0

## 2018-01-17 MED FILL — HYDROCODONE-CHLORPHENIRAM S: 10-8 | 7 days supply | Qty: 70 | Fill #0

## 2018-01-17 MED FILL — VENTOLIN HFA 90 MCG INHALER: 108 (90 BAS | 17 days supply | Qty: 18 | Fill #0

## 2018-01-17 MED FILL — GABAPENTIN 600 MG TAB: 600 | 30 days supply | Qty: 120 | Fill #3

## 2018-01-22 ENCOUNTER — Inpatient Hospital Stay: Payer: 59 | Attending: Hematology & Oncology

## 2018-01-22 ENCOUNTER — Other Ambulatory Visit: Payer: Self-pay | Admitting: *Deleted

## 2018-01-22 ENCOUNTER — Other Ambulatory Visit: Payer: Self-pay | Admitting: Hematology & Oncology

## 2018-01-22 ENCOUNTER — Other Ambulatory Visit: Payer: Self-pay

## 2018-01-22 ENCOUNTER — Ambulatory Visit (INDEPENDENT_AMBULATORY_CARE_PROVIDER_SITE_OTHER): Admitting: Family

## 2018-01-22 ENCOUNTER — Encounter: Payer: Self-pay | Admitting: Family

## 2018-01-22 ENCOUNTER — Inpatient Hospital Stay (HOSPITAL_BASED_OUTPATIENT_CLINIC_OR_DEPARTMENT_OTHER): Payer: 59 | Admitting: Hematology & Oncology

## 2018-01-22 ENCOUNTER — Encounter: Payer: Self-pay | Admitting: Hematology & Oncology

## 2018-01-22 VITALS — BP 121/77 | HR 100 | Temp 98.6°F | Resp 16 | Wt 152.0 lb

## 2018-01-22 VITALS — BP 124/78 | HR 96 | Temp 98.4°F | Resp 16 | Ht 67.0 in | Wt 152.8 lb

## 2018-01-22 DIAGNOSIS — Z0001 Encounter for general adult medical examination with abnormal findings: Secondary | ICD-10-CM

## 2018-01-22 DIAGNOSIS — J430 Unilateral pulmonary emphysema [MacLeod's syndrome]: Secondary | ICD-10-CM

## 2018-01-22 DIAGNOSIS — T451X5S Adverse effect of antineoplastic and immunosuppressive drugs, sequela: Secondary | ICD-10-CM | POA: Insufficient documentation

## 2018-01-22 DIAGNOSIS — R059 Cough, unspecified: Secondary | ICD-10-CM

## 2018-01-22 DIAGNOSIS — Z9221 Personal history of antineoplastic chemotherapy: Secondary | ICD-10-CM | POA: Diagnosis not present

## 2018-01-22 DIAGNOSIS — C180 Malignant neoplasm of cecum: Secondary | ICD-10-CM

## 2018-01-22 DIAGNOSIS — Z85038 Personal history of other malignant neoplasm of large intestine: Secondary | ICD-10-CM | POA: Diagnosis present

## 2018-01-22 DIAGNOSIS — Z0000 Encounter for general adult medical examination without abnormal findings: Secondary | ICD-10-CM | POA: Diagnosis not present

## 2018-01-22 DIAGNOSIS — G629 Polyneuropathy, unspecified: Secondary | ICD-10-CM

## 2018-01-22 DIAGNOSIS — R05 Cough: Secondary | ICD-10-CM | POA: Diagnosis not present

## 2018-01-22 DIAGNOSIS — B370 Candidal stomatitis: Secondary | ICD-10-CM | POA: Diagnosis not present

## 2018-01-22 DIAGNOSIS — H6691 Otitis media, unspecified, right ear: Secondary | ICD-10-CM | POA: Diagnosis not present

## 2018-01-22 DIAGNOSIS — G620 Drug-induced polyneuropathy: Secondary | ICD-10-CM | POA: Diagnosis not present

## 2018-01-22 DIAGNOSIS — Z79899 Other long term (current) drug therapy: Secondary | ICD-10-CM | POA: Diagnosis not present

## 2018-01-22 LAB — CEA (IN HOUSE-CHCC): CEA (CHCC-IN HOUSE): 5.12 ng/mL — AB (ref 0.00–5.00)

## 2018-01-22 LAB — CBC WITH DIFFERENTIAL (CANCER CENTER ONLY)
BASOS ABS: 0 10*3/uL (ref 0.0–0.1)
Basophils Relative: 0 %
EOS ABS: 0.1 10*3/uL (ref 0.0–0.5)
EOS PCT: 1 %
HEMATOCRIT: 41.5 % (ref 34.8–46.6)
Hemoglobin: 13.6 g/dL (ref 11.6–15.9)
Lymphocytes Relative: 13 %
Lymphs Abs: 1.7 10*3/uL (ref 0.9–3.3)
MCH: 23 pg — ABNORMAL LOW (ref 26.0–34.0)
MCHC: 32.8 g/dL (ref 32.0–36.0)
MCV: 70.2 fL — ABNORMAL LOW (ref 81.0–101.0)
MONO ABS: 0.8 10*3/uL (ref 0.1–0.9)
Monocytes Relative: 6 %
NEUTROS ABS: 10 10*3/uL — AB (ref 1.5–6.5)
Neutrophils Relative %: 80 %
PLATELETS: 170 10*3/uL (ref 145–400)
RBC: 5.91 MIL/uL — ABNORMAL HIGH (ref 3.70–5.32)
RDW: 17.1 % — AB (ref 11.1–15.7)
WBC Count: 12.6 10*3/uL — ABNORMAL HIGH (ref 3.9–10.0)

## 2018-01-22 LAB — CBC WITH DIFFERENTIAL/PLATELET
BASOS PCT: 0.3 % (ref 0.0–3.0)
Basophils Absolute: 0 10*3/uL (ref 0.0–0.1)
Eosinophils Absolute: 0.1 10*3/uL (ref 0.0–0.7)
Eosinophils Relative: 0.7 % (ref 0.0–5.0)
HCT: 42.3 % (ref 36.0–46.0)
Hemoglobin: 13.5 g/dL (ref 12.0–15.0)
LYMPHS ABS: 1.8 10*3/uL (ref 0.7–4.0)
Lymphocytes Relative: 13.5 % (ref 12.0–46.0)
MCHC: 32 g/dL (ref 30.0–36.0)
MCV: 72 fl — AB (ref 78.0–100.0)
MONOS PCT: 4.8 % (ref 3.0–12.0)
Monocytes Absolute: 0.6 10*3/uL (ref 0.1–1.0)
NEUTROS ABS: 10.6 10*3/uL — AB (ref 1.4–7.7)
NEUTROS PCT: 80.7 % — AB (ref 43.0–77.0)
PLATELETS: 167 10*3/uL (ref 150.0–400.0)
RBC: 5.87 Mil/uL — ABNORMAL HIGH (ref 3.87–5.11)
RDW: 16.5 % — AB (ref 11.5–15.5)
WBC: 13.1 10*3/uL — ABNORMAL HIGH (ref 4.0–10.5)

## 2018-01-22 LAB — HEPATIC FUNCTION PANEL
ALBUMIN: 3.7 g/dL (ref 3.5–5.2)
ALT: 12 U/L (ref 0–35)
AST: 12 U/L (ref 0–37)
Alkaline Phosphatase: 55 U/L (ref 39–117)
Bilirubin, Direct: 0.1 mg/dL (ref 0.0–0.3)
Total Bilirubin: 0.6 mg/dL (ref 0.2–1.2)
Total Protein: 6.8 g/dL (ref 6.0–8.3)

## 2018-01-22 LAB — LIPID PANEL
CHOLESTEROL: 156 mg/dL (ref 0–200)
HDL: 39.5 mg/dL (ref >39.00)
LDL Cholesterol: 87 mg/dL (ref 0–99)
NonHDL: 116.22
TRIGLYCERIDES: 148 mg/dL (ref 0.0–149.0)
Total CHOL/HDL Ratio: 4
VLDL: 29.6 mg/dL (ref 0.0–40.0)

## 2018-01-22 LAB — BASIC METABOLIC PANEL
BUN: 12 mg/dL (ref 6–23)
CO2: 30 meq/L (ref 19–32)
CREATININE: 0.75 mg/dL (ref 0.40–1.20)
Calcium: 9.5 mg/dL (ref 8.4–10.5)
Chloride: 101 mEq/L (ref 96–112)
GFR: 82.99 mL/min (ref >60.00)
GLUCOSE: 87 mg/dL (ref 70–99)
Potassium: 3.8 mEq/L (ref 3.5–5.1)
SODIUM: 137 meq/L (ref 135–145)

## 2018-01-22 LAB — COMPREHENSIVE METABOLIC PANEL
ALBUMIN: 3.6 g/dL (ref 3.5–5.0)
ALT: 14 U/L (ref 0–55)
ANION GAP: 9 (ref 3–11)
AST: 14 U/L (ref 5–34)
Alkaline Phosphatase: 67 U/L (ref 40–150)
BILIRUBIN TOTAL: 0.7 mg/dL (ref 0.2–1.2)
BUN: 12 mg/dL (ref 7–26)
CHLORIDE: 103 mmol/L (ref 98–109)
CO2: 26 mmol/L (ref 22–29)
Calcium: 9.8 mg/dL (ref 8.4–10.4)
Creatinine, Ser: 0.86 mg/dL (ref 0.60–1.10)
GFR calc Af Amer: >60 mL/min (ref >60)
GFR calc non Af Amer: >60 mL/min (ref >60)
Glucose, Bld: 105 mg/dL (ref 70–140)
POTASSIUM: 4.4 mmol/L (ref 3.5–5.1)
Sodium: 138 mmol/L (ref 136–145)
TOTAL PROTEIN: 7.1 g/dL (ref 6.4–8.3)

## 2018-01-22 LAB — URINALYSIS, ROUTINE W REFLEX MICROSCOPIC
Bilirubin Urine: NEGATIVE
Hgb urine dipstick: NEGATIVE
Ketones, ur: NEGATIVE
Leukocytes, UA: NEGATIVE
Nitrite: NEGATIVE
PH: 5 (ref 5.0–8.0)
RBC / HPF: NONE SEEN (ref 0–?)
SPECIFIC GRAVITY, URINE: 1.025 (ref 1.000–1.030)
Total Protein, Urine: NEGATIVE
Urine Glucose: NEGATIVE
Urobilinogen, UA: 0.2 (ref 0.0–1.0)

## 2018-01-22 MED ORDER — AMOXICILLIN 500 MG PO CAPS: 500.0000 mg | ORAL_CAPSULE | Freq: Three times a day (TID) | ORAL | 0 refills | Status: DC

## 2018-01-22 MED ORDER — OXYCODONE-ACETAMINOPHEN 10-325 MG PO TABS: ORAL_TABLET | ORAL | 0 refills | Status: DC

## 2018-01-22 MED ORDER — NYSTATIN 100000 UNIT/ML MT SUSP: 5.0000 mL | Freq: Four times a day (QID) | OROMUCOSAL | 0 refills | Status: DC

## 2018-01-22 MED ORDER — HYDROCOD POLST-CPM POLST ER 10-8 MG/5ML PO SUER: 5.0000 mL | Freq: Two times a day (BID) | ORAL | 0 refills | Status: DC | PRN

## 2018-01-22 MED ORDER — ZOSTER VAC RECOMB ADJUVANTED 50 MCG/0.5ML IM SUSR: INTRAMUSCULAR | 1 refills | Status: DC

## 2018-01-22 MED FILL — AMOXICILLIN 500 MG CAPSULE: 500 | 10 days supply | Qty: 30 | Fill #0

## 2018-01-22 MED FILL — OXYCODONE-APAP 10-325: 10-325 | 10 days supply | Qty: 120 | Fill #0

## 2018-01-22 MED FILL — FLUCONAZOLE 150 MG TABS: 150 | 1 days supply | Qty: 1 | Fill #0

## 2018-01-22 NOTE — Progress Notes (Signed)
Subjective:    Patient ID: Teresa Morgan, female    DOB: 07-20-1956, 62 y.o.   MRN: [id number]  HPI  Patient presents today for complete physical.  Immunizations: tdap 2014, pneumovax 2014,  Diet: healthy Exercise: active, no formal exercise Colonoscopy:  2013 Dexa: 3/18 Pap Smear: 3/18 Mammogram: 3/18 Vision:  due Dental: up to date    Review of Systems  Constitutional: Negative for unexpected weight change.  HENT: Positive for rhinorrhea. Negative for hearing loss.   Eyes: Negative for visual disturbance.  Respiratory: Positive for cough.   Cardiovascular: Negative for leg swelling.  Gastrointestinal: Negative for blood in stool, constipation and diarrhea.  Genitourinary: Negative for dysuria and frequency.  Musculoskeletal: Negative for arthralgias and myalgias.  Skin: Negative for rash.  Neurological:       Frontal HA, ? sinus  Hematological: Negative for adenopathy.  Psychiatric/Behavioral:       Denies depression/anxiety   Past Medical History:  Diagnosis Date  . Anemia   . Anxiety   . Arthritis   . Colon cancer ([place])   . COPD with emphysema ([place]) 06/18/2012  . Depression   . GERD (gastroesophageal reflux disease)   . Peptic ulcer   . Pneumonia   . Shortness of breath dyspnea    with exertion     Social History   Socioeconomic History  . Marital status: Married    Spouse name: Not on file  . Number of children: 3  . Years of education: Not on file  . Highest education level: Not on file  Social Needs  . Financial resource strain: Not on file  . Food insecurity - worry: Not on file  . Food insecurity - inability: Not on file  . Transportation needs - medical: Not on file  . Transportation needs - non-medical: Not on file  Occupational History  . Occupation: Armed forces operational officer: [employer]  Tobacco Use  . Smoking status: Former Smoker    Packs/day: 1.00    Years: 20.00    Pack years: 20.00    Types: Cigarettes    Start date: 04/23/1986    Last attempt to quit: 12/25/2017    Years since quitting: 0.0  . Smokeless tobacco: Never Used  Substance and Sexual Activity  . Alcohol use: Yes    Alcohol/week: 0.0 oz    Comment: social  . Drug use: No  . Sexual activity: Not on file  Other Topics Concern  . 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 APPENDECTOMY  07/07/11  . COLON RESECTION  2012   for colon cancer  . COLONOSCOPY    . EYE SURGERY Bilateral    cataract surgery with lens implant  . heel spurs Right   . hemrrhoids    . PLANTAR FASCIA SURGERY Right 2009  . PORT-A-CATH REMOVAL Left 04/19/2016   Procedure: REMOVAL PORT-A-CATH;  Surgeon: [name], MD;  Location: [facility];  Service: General;  Laterality: Left;  . portacath    . TUBAL LIGATION  07/1980    Family History  Problem Relation Age of Onset  . Diabetes Mother   . Colon cancer Cousin     Allergies  Allergen Reactions  . Aspirin     Upset stomach  . Cymbalta [Duloxetine Hcl]  Swelling    Swollen lips   . Prozac [Fluoxetine Hcl] Swelling    Lip swelling  . Wellbutrin [Bupropion] Swelling    Lip swelling    Current Outpatient Medications on File Prior to Visit  Medication Sig Dispense Refill  . ALPRAZolam (XANAX) 1 MG tablet Take 1 tablet (1 mg total) by mouth at bedtime as needed for anxiety or sleep. 30 tablet 2  . BREO ELLIPTA 100-25 MCG/INH AEPB INHALE 1 PUFF INTO THE LUNGS ONCE DAILY 60 each 3  . Calcium-Vitamin D 600-125 MG-UNIT TABS Take 1 tablet by mouth daily.     Marland Kitchen gabapentin (NEURONTIN) 600 MG tablet TAKE 1 TABLET (600 MG TOTAL) BY MOUTH 4 (FOUR) TIMES DAILY. 120 tablet 3  . Multiple Vitamin (MULTIVITAMIN) tablet Take 1 tablet by mouth daily.      Marland Kitchen oxyCODONE-acetaminophen (PERCOCET) 10-325 MG tablet 1 - 2 tabs every 4 hours as needed for pain 120 tablet 0  . polyethylene glycol  powder (GLYCOLAX/MIRALAX) powder Take 17 g by mouth 2 (two) times daily as needed. (Patient taking differently: Take 17 g by mouth 2 (two) times daily as needed for moderate constipation. ) 3350 g 1  . Pyridoxine HCl (VITAMIN B-6) 250 MG tablet Take 1 tablet (250 mg total) by mouth daily. 30 tablet 12  . TURMERIC PO Take 1 tablet by mouth daily.     . vitamin E 200 UNIT capsule Take 200 Units by mouth daily.      Marland Kitchen vortioxetine HBr (TRINTELLIX) 5 MG TABS Take 1 tablet (5 mg total) by mouth daily. Fill on or after 04/20/17 30 tablet 5   No current facility-administered medications on file prior to visit.     BP 124/78 (BP Location: Right Arm, Cuff Size: Normal)   Pulse 96   Temp 98.4 F (36.9 C) (Oral)   Resp 16   Ht 5\' 7"  (1.702 m)   Wt 152 lb 12.8 oz (69.3 kg)   SpO2 100%   BMI 23.93 kg/m       Objective:   Physical Exam  Physical Exam  Constitutional: She is oriented to person, place, and time. She appears well-developed and well-nourished. No distress.  HENT:  Head: Normocephalic and atraumatic.  Right Ear: Tympanic membrane + erythema and ear canal normal.  Left Ear: Tympanic membrane and ear canal normal.  Mouth/Throat: Oral thrush noted on tongue Eyes: Pupils are equal, round, and reactive to light. No scleral icterus.  Neck: Normal range of motion. No thyromegaly present.  Cardiovascular: Normal rate and regular rhythm.   No murmur heard. Pulmonary/Chest: Effort normal and breath sounds normal. No respiratory distress. He has no wheezes. She has no rales. She exhibits no tenderness.  Abdominal: Soft. Bowel sounds are normal. She exhibits no distension and no mass. There is no tenderness. There is no rebound and no guarding.  Musculoskeletal: She exhibits no edema.  Lymphadenopathy:    She has no cervical adenopathy.  Neurological: She is alert and oriented to person, place, and time. She has normal patellar reflexes. She exhibits normal muscle tone. Coordination  normal.  Skin: Skin is warm and dry.  Psychiatric: She has a normal mood and affect. Her behavior is normal. Judgment and thought content normal.  Breasts: Examined lying Right: Without masses, retractions, discharge or axillary adenopathy.  Left: Without masses, retractions, discharge or axillary adenopathy.       Assessment & Plan:         Assessment & Plan:  Preventative care- overdue  for colo, advised pt to schedule. Obtain routine lab work. EKG is performed and personally reviewed.  Notes NSR.   I do not see flutter as read by computer. I showed to another provider who agreed- NSR.   Cough- requests refill of tussionex.  I told her I would give a 1 time refill. States cough is better but not gone and this is the "only thing that helps." I see she is on oxycodone. Advised not safe to take together. She verbalizes understanding and states she will stop oxycodone while taking tussionex.   R OM- rx with amoxicillin  Oral thrush- rx with nystatin.

## 2018-01-22 NOTE — Patient Instructions (Signed)
Please try to add 30 minutes of walking 5 days a week. Complete lab work prior to leaving You should be contacted about scheduling your colonoscopy.

## 2018-01-22 NOTE — Progress Notes (Signed)
Hematology and Oncology Follow Up Visit  Teresa Morgan [id number] [birth year] 62 y.o. 01/22/2018   Principle Diagnosis:  1. Stage IIIB (T2 N2 M0) adenocarcinoma of the cecum. 2. Neuropathy secondary to chemotherapy.  Current Therapy:    Observation     Interim History:  Ms.  Morgan is back for followup.  We see her every 6 months.  Since we last saw her, she is been doing pretty well.  She is going to retire.  She works for the [employer].  She is 62 years old.  She wants to retire this year.  I do not blame her.  She and her family went to [place] a couple weeks ago for a wedding.  They had a good time.  Her last CEA level was stable at 4.9.  She is due for a colonoscopy.  She missed this.  She says that she will call the gastroenterology group and have this colonoscopy scheduled.  She knows how important this is.  She is not smoking.  I think is great that she stop smoking..  She still has the neuropathy.  Her Percocet seem to help her quite a bit with this.  This does seem to make her functional.  She is had no bleeding.  She is had no influenza.  She is had no fever.   Her performance status is ECOG 1  Medications:  Current Outpatient Medications:  .  ALPRAZolam (XANAX) 1 MG tablet, Take 1 tablet (1 mg total) by mouth at bedtime as needed for anxiety or sleep., Disp: 30 tablet, Rfl: 2 .  amoxicillin (AMOXIL) 500 MG capsule, Take 1 capsule (500 mg total) by mouth 3 (three) times daily., Disp: 30 capsule, Rfl: 0 .  BREO ELLIPTA 100-25 MCG/INH AEPB, INHALE 1 PUFF INTO THE LUNGS ONCE DAILY, Disp: 60 each, Rfl: 3 .  Calcium-Vitamin D 600-125 MG-UNIT TABS, Take 1 tablet by mouth daily. , Disp: , Rfl:  .  chlorpheniramine-HYDROcodone (TUSSIONEX PENNKINETIC ER) 10-8 MG/5ML SUER, Take 5 mLs by mouth every 12 (twelve) hours as needed for cough. (do not take with percocet), Disp: 60 mL, Rfl: 0 .  gabapentin (NEURONTIN) 600 MG tablet, TAKE 1 TABLET (600 MG TOTAL) BY MOUTH 4 (FOUR)  TIMES DAILY., Disp: 120 tablet, Rfl: 3 .  Multiple Vitamin (MULTIVITAMIN) tablet, Take 1 tablet by mouth daily.  , Disp: , Rfl:  .  nystatin (MYCOSTATIN) 100000 UNIT/ML suspension, Take 5 mLs (500,000 Units total) by mouth 4 (four) times daily., Disp: 200 mL, Rfl: 0 .  oxyCODONE-acetaminophen (PERCOCET) 10-325 MG tablet, 1 - 2 tabs every 4 hours as needed for pain, Disp: 120 tablet, Rfl: 0 .  polyethylene glycol powder (GLYCOLAX/MIRALAX) powder, Take 17 g by mouth 2 (two) times daily as needed. (Patient taking differently: Take 17 g by mouth 2 (two) times daily as needed for moderate constipation. ), Disp: 3350 g, Rfl: 1 .  Pyridoxine HCl (VITAMIN B-6) 250 MG tablet, Take 1 tablet (250 mg total) by mouth daily., Disp: 30 tablet, Rfl: 12 .  TURMERIC PO, Take 1 tablet by mouth daily. , Disp: , Rfl:  .  vitamin E 200 UNIT capsule, Take 200 Units by mouth daily.  , Disp: , Rfl:  .  vortioxetine HBr (TRINTELLIX) 5 MG TABS, Take 1 tablet (5 mg total) by mouth daily. Fill on or after 04/20/17, Disp: 30 tablet, Rfl: 5 .  Zoster Vaccine Adjuvanted [facility]) injection, Inject 0.5mg  IM now and again in 2-6 months., Disp: 0.5 mL, Rfl:  1  Allergies:  Allergies  Allergen Reactions  . Aspirin     Upset stomach  . Cymbalta [Duloxetine Hcl] Swelling    Swollen lips   . Prozac [Fluoxetine Hcl] Swelling    Lip swelling  . Wellbutrin [Bupropion] Swelling    Lip swelling    Past Medical History, Surgical history, Social history, and Family History were reviewed and updated.  Review of Systems: Review of Systems  Constitutional: Negative.   HENT: Negative.   Eyes: Negative.   Respiratory: Negative.   Cardiovascular: Negative.   Gastrointestinal: Positive for nausea.  Genitourinary: Negative.   Musculoskeletal: Negative.   Skin: Negative.   Neurological: Positive for tingling.  Endo/Heme/Allergies: Negative.   Psychiatric/Behavioral: Negative.     Physical Exam:  weight is 152 lb (68.9 kg). Her  oral temperature is 98.6 F (37 C). Her blood pressure is 121/77 and her pulse is 100. Her respiration is 16 and oxygen saturation is 97%.   Physical Exam  Constitutional: She is oriented to person, place, and time.  HENT:  Head: Normocephalic and atraumatic.  Mouth/Throat: Oropharynx is clear and moist.  Eyes: EOM are normal. Pupils are equal, round, and reactive to light.  Neck: Normal range of motion.  Cardiovascular: Normal rate, regular rhythm and normal heart sounds.  Pulmonary/Chest: Effort normal and breath sounds normal.  Abdominal: Soft. Bowel sounds are normal.  Musculoskeletal: Normal range of motion. She exhibits no edema, tenderness or deformity.  Lymphadenopathy:    She has no cervical adenopathy.  Neurological: She is alert and oriented to person, place, and time.  Skin: Skin is warm and dry. No rash noted. No erythema.  Psychiatric: She has a normal mood and affect. Her behavior is normal. Judgment and thought content normal.  Vitals reviewed.    Lab Results  Component Value Date   WBC 12.6 (H) 01/22/2018   HGB 13.7 07/24/2017   HCT 41.5 01/22/2018   MCV 70.2 (L) 01/22/2018   PLT 170 01/22/2018     Chemistry      Component Value Date/Time   NA 141 07/24/2017 1147   NA 142 09/21/2015 1306   K 3.3 07/24/2017 1147   K 3.8 09/21/2015 1306   CL 103 07/24/2017 1147   CO2 29 07/24/2017 1147   CO2 27 09/21/2015 1306   BUN 8 07/24/2017 1147   BUN 9.8 09/21/2015 1306   CREATININE 0.9 07/24/2017 1147   CREATININE 0.8 09/21/2015 1306      Component Value Date/Time   CALCIUM 9.0 07/24/2017 1147   CALCIUM 9.2 09/21/2015 1306   ALKPHOS 81 07/24/2017 1147   ALKPHOS 77 09/21/2015 1306   AST 18 07/24/2017 1147   AST 15 09/21/2015 1306   ALT 20 07/24/2017 1147   ALT 13 09/21/2015 1306   BILITOT 0.70 07/24/2017 1147   BILITOT 0.40 09/21/2015 1306         Impression and Plan: Teresa Morgan is 62 year old female with stage IIIB colon cancer. This was of the  cecum. She underwent resection back in September of 2012. She had 4 positive lymph nodes. She completed chemotherapy in March of 2013.  Thankfully, I don't see any evidence of colon cancer recurrence.   I am glad that she is going to retire.  She really deserves to retire.  She is not smoking which is great.  She will get her colonoscopy done.  I will plan to see her back in 6 months.  Volanda Napoleon, MD 3/11/20191:32 PM

## 2018-01-23 LAB — TSH: TSH: 0.57 u[IU]/mL (ref 0.35–4.50)

## 2018-01-23 MED FILL — HYDROCODONE-CHLORPHENIRAM S: 10-8 | 6 days supply | Qty: 60 | Fill #0

## 2018-01-24 ENCOUNTER — Other Ambulatory Visit: Payer: Self-pay | Admitting: Family

## 2018-01-24 ENCOUNTER — Encounter: Payer: Self-pay | Admitting: Gastroenterology

## 2018-01-24 MED FILL — ALPRAZolam 1 MG TABS: 1 | 30 days supply | Qty: 30 | Fill #1

## 2018-01-24 MED FILL — TRINTELLIX 5 MG TABLET: 5 | 30 days supply | Qty: 30 | Fill #0

## 2018-02-05 ENCOUNTER — Inpatient Hospital Stay (HOSPITAL_BASED_OUTPATIENT_CLINIC_OR_DEPARTMENT_OTHER): Admission: RE | Admit: 2018-02-05 | Payer: Self-pay | Source: Ambulatory Visit

## 2018-02-12 ENCOUNTER — Ambulatory Visit (HOSPITAL_BASED_OUTPATIENT_CLINIC_OR_DEPARTMENT_OTHER)
Admission: RE | Admit: 2018-02-12 | Discharge: 2018-02-12 | Disposition: A | Discharge status: Home / Self Care | Payer: 59 | Source: Ambulatory Visit | Attending: Family | Admitting: Family

## 2018-02-12 ENCOUNTER — Other Ambulatory Visit (INDEPENDENT_AMBULATORY_CARE_PROVIDER_SITE_OTHER): Payer: 59

## 2018-02-12 DIAGNOSIS — Z0000 Encounter for general adult medical examination without abnormal findings: Secondary | ICD-10-CM

## 2018-02-12 DIAGNOSIS — Z1231 Encounter for screening mammogram for malignant neoplasm of breast: Secondary | ICD-10-CM | POA: Insufficient documentation

## 2018-02-12 DIAGNOSIS — D72829 Elevated white blood cell count, unspecified: Secondary | ICD-10-CM | POA: Diagnosis not present

## 2018-02-12 LAB — CBC WITH DIFFERENTIAL/PLATELET
BASOS PCT: 0.3 % (ref 0.0–3.0)
Basophils Absolute: 0 10*3/uL (ref 0.0–0.1)
EOS PCT: 0.8 % (ref 0.0–5.0)
Eosinophils Absolute: 0.1 10*3/uL (ref 0.0–0.7)
HCT: 41.4 % (ref 36.0–46.0)
Hemoglobin: 13 g/dL (ref 12.0–15.0)
LYMPHS ABS: 1.4 10*3/uL (ref 0.7–4.0)
Lymphocytes Relative: 17.6 % (ref 12.0–46.0)
MCHC: 31.4 g/dL (ref 30.0–36.0)
MCV: 72.6 fl — AB (ref 78.0–100.0)
MONOS PCT: 3.1 % (ref 3.0–12.0)
Monocytes Absolute: 0.2 10*3/uL (ref 0.1–1.0)
Neutro Abs: 6.2 10*3/uL (ref 1.4–7.7)
Neutrophils Relative %: 78.2 % — ABNORMAL HIGH (ref 43.0–77.0)
Platelets: 195 10*3/uL (ref 150.0–400.0)
RBC: 5.71 Mil/uL — AB (ref 3.87–5.11)
RDW: 16.7 % — AB (ref 11.5–15.5)
WBC: 7.9 10*3/uL (ref 4.0–10.5)

## 2018-02-13 ENCOUNTER — Other Ambulatory Visit: Payer: Self-pay | Admitting: Family

## 2018-02-13 DIAGNOSIS — R928 Other abnormal and inconclusive findings on diagnostic imaging of breast: Secondary | ICD-10-CM

## 2018-02-14 ENCOUNTER — Telehealth: Payer: Self-pay | Admitting: *Deleted

## 2018-02-14 NOTE — Telephone Encounter (Signed)
Copied from [place] [id number]. Topic: Inquiry >> Feb 14, 2018 12:27 PM Neva Seat wrote: Pt checking on labs done on Mon. 02-12-18.

## 2018-02-15 ENCOUNTER — Other Ambulatory Visit: Payer: Self-pay

## 2018-02-15 ENCOUNTER — Encounter: Payer: Self-pay | Admitting: Family

## 2018-02-15 NOTE — Telephone Encounter (Signed)
This encounter was created in error - please disregard.

## 2018-02-15 NOTE — Telephone Encounter (Signed)
Patient called for lab results, results given per note Debbrah Alar 02/15/18, patient verbalized understanding.

## 2018-02-15 NOTE — Telephone Encounter (Signed)
White blood cell count has returned to normal.

## 2018-02-16 ENCOUNTER — Other Ambulatory Visit: Payer: Self-pay | Admitting: Family

## 2018-02-16 ENCOUNTER — Ambulatory Visit
Admission: RE | Admit: 2018-02-16 | Discharge: 2018-02-16 | Disposition: A | Discharge status: Home / Self Care | Payer: 59 | Source: Ambulatory Visit | Attending: Family | Admitting: Family

## 2018-02-16 DIAGNOSIS — N6489 Other specified disorders of breast: Secondary | ICD-10-CM

## 2018-02-16 DIAGNOSIS — R928 Other abnormal and inconclusive findings on diagnostic imaging of breast: Secondary | ICD-10-CM

## 2018-02-16 NOTE — Progress Notes (Signed)
Patient reports she is scheduled to have additional images today.

## 2018-02-19 ENCOUNTER — Telehealth: Payer: Self-pay | Admitting: *Deleted

## 2018-02-19 NOTE — Telephone Encounter (Signed)
Received Physician Orders from [place]; forwarded to provider/SLS 04/08

## 2018-02-20 ENCOUNTER — Other Ambulatory Visit: Payer: Self-pay | Admitting: *Deleted

## 2018-02-20 DIAGNOSIS — J430 Unilateral pulmonary emphysema [MacLeod's syndrome]: Secondary | ICD-10-CM

## 2018-02-20 DIAGNOSIS — G629 Polyneuropathy, unspecified: Secondary | ICD-10-CM

## 2018-02-20 DIAGNOSIS — C180 Malignant neoplasm of cecum: Secondary | ICD-10-CM

## 2018-02-20 MED ORDER — OXYCODONE-ACETAMINOPHEN 10-325 MG PO TABS: ORAL_TABLET | ORAL | 0 refills | Status: DC

## 2018-02-20 MED FILL — OXYCODONE-APAP 10-325 MG TA: 10-325 | 10 days supply | Qty: 120 | Fill #0

## 2018-02-23 ENCOUNTER — Ambulatory Visit
Admission: RE | Admit: 2018-02-23 | Discharge: 2018-02-23 | Disposition: A | Discharge status: Home / Self Care | Payer: 59 | Source: Ambulatory Visit | Attending: Family | Admitting: Family

## 2018-02-23 ENCOUNTER — Other Ambulatory Visit: Payer: Self-pay | Admitting: Family

## 2018-02-23 DIAGNOSIS — N6489 Other specified disorders of breast: Secondary | ICD-10-CM

## 2018-02-26 MED FILL — ALPRAZolam 1 MG TABS: 1 | 30 days supply | Qty: 30 | Fill #2

## 2018-02-26 MED FILL — TRINTELLIX 5 MG TABLET: 5 | 30 days supply | Qty: 30 | Fill #1

## 2018-02-28 ENCOUNTER — Telehealth: Payer: Self-pay | Admitting: *Deleted

## 2018-02-28 NOTE — Telephone Encounter (Signed)
Spoke with Jon Gills at the [facility] to verify if 02/23/18 breast biopsy had been addressed or if PCP needed to address results.  She states pt has already been contacted with results and appointment has been scheduled with a surgeon for excision.

## 2018-03-03 NOTE — Telephone Encounter (Signed)
Noted  

## 2018-03-08 ENCOUNTER — Other Ambulatory Visit: Payer: Self-pay | Admitting: General Surgery

## 2018-03-08 DIAGNOSIS — R928 Other abnormal and inconclusive findings on diagnostic imaging of breast: Secondary | ICD-10-CM

## 2018-03-09 ENCOUNTER — Other Ambulatory Visit: Payer: Self-pay

## 2018-03-09 ENCOUNTER — Other Ambulatory Visit: Payer: Self-pay | Admitting: General Surgery

## 2018-03-09 ENCOUNTER — Encounter (HOSPITAL_BASED_OUTPATIENT_CLINIC_OR_DEPARTMENT_OTHER): Payer: Self-pay | Admitting: *Deleted

## 2018-03-09 DIAGNOSIS — R928 Other abnormal and inconclusive findings on diagnostic imaging of breast: Secondary | ICD-10-CM

## 2018-03-12 NOTE — H&P (Signed)
Teresa Morgan Documented: 03/08/2018 3:20 PM Location: [facility] Patient #: [id number] DOB: Nov 23, 1955 Married / Language: Teresa Morgan / Race: White Female   History of Present Illness Teresa Morgan; 03/08/2018 4:14 PM) The patient is a 62 year old female who presents with a complaint of Breast problems. Pt is a 62 yo F that I know from prior surgical tx of cecal cancer who presents in consultation at the request of Dr. Rosana Morgan for a new dx of abnormal right mammogram. I did a laparoscopic right hemocolectomy 07/2011. She was stage IIIB. She received adjuvant chemotherapy until 01/2012.   She has been followed by Dr. Marin Morgan during this time. Last CEA was 5.12. This was reasonably stable. She had an abnormal mammogram this spring. Dx mammograms showed screening detected architectural distortion. Core needle biopsy showed a complex sclerosing lesion. She presents for discordant pathology. She denies breast pain. She has no family history of colon cancer.    dx mammogram right 02/16/18 ACR Breast Density Category b: There are scattered areas of fibroglandular density.  FINDINGS: 2D and 3D spot compression views of the RIGHT breast demonstrate persistent distortion within the INNER slightly UPPER RIGHT breast.  Targeted ultrasound is performed, showing no sonographic correlate to the distortion identified.  No abnormal RIGHT axillary lymph nodes are identified.  IMPRESSION: 1. Persistent INNER slightly UPPER RIGHT breast distortion without sonographic correlate. Tissue sampling is recommended.  RECOMMENDATION: Stereotactic/3D guided biopsy of RIGHT breast distortion, which will be arranged.  I have discussed the findings and recommendations with the patient. Results were also provided in writing at the conclusion of the visit. If applicable, a reminder letter will be sent to the patient regarding the next appointment.  BI-RADS CATEGORY 4: Suspicious.  Right  breast needle bx 02/23/2018 Diagnosis Breast, right, needle core biopsy, medial - COMPLEX SCLEROSING LESION. - SEE COMMENT.   Past Surgical History (Teresa Morgan, [name]; 03/08/2018 3:20 PM) Breast Biopsy  Right.  Diagnostic Studies History (Teresa Morgan, [name]; 03/08/2018 3:20 PM) Colonoscopy  within last year Mammogram  within last year  Allergies (Teresa Morgan, [place]; 03/08/2018 3:22 PM) No Known Drug Allergies [03/08/2018]: Allergies Reconciled   Medication History (Teresa Morgan, [name]; 03/08/2018 3:22 PM) ALPRAZolam (1MG  Tablet, Oral) Active. Breo Ellipta (100-25MCG/INH Aero Pow Br Act, Inhalation) Active. Gabapentin (600MG  Tablet, Oral) Active. Trintellix (5MG  Tablet, Oral) Active. Ventolin HFA (108 (90 Base)MCG/ACT Aerosol Soln, Inhalation) Active. Medications Reconciled  Social History (Teresa Morgan, [name]; 03/08/2018 3:20 PM) Alcohol use  Occasional alcohol use. Caffeine use  Carbonated beverages, Coffee, Tea. No drug use  Tobacco use  Former smoker.  Family History (Teresa Morgan, [place]; 03/08/2018 3:20 PM) Cerebrovascular Accident  Mother. Colon Cancer  Family Members In General. Hypertension  Mother.  Pregnancy / Birth History (Teresa Morgan, [name]; 03/08/2018 3:20 PM) Age at menarche  62 years. Age of menopause  57-50 Gravida  3 Length (months) of breastfeeding  7-12 Maternal age  60-20 Para  3  Other Problems (Teresa Morgan, [name]; 03/08/2018 3:20 PM) Back Pain     Review of Systems Teresa Morgan; 03/08/2018 4:14 PM) General Present- Weight Loss. Not Present- Appetite Loss, Chills, Fatigue, Fever, Night Sweats and Weight Gain. Skin Not Present- Change in Wart/Mole, Dryness, Hives, Jaundice, New Lesions, Non-Healing Wounds, Rash and Ulcer. HEENT Not Present- Earache, Hearing Loss, Hoarseness, Nose Bleed, Oral Ulcers, Ringing in the Ears, Seasonal Allergies, Sinus Pain, Sore Throat, Visual Disturbances, Wears  glasses/contact lenses and Yellow Eyes.  Respiratory Present- Difficulty Breathing and Snoring. Not Present- Bloody sputum, Chronic Cough and Wheezing. Breast Not Present- Breast Mass, Breast Pain, Nipple Discharge and Skin Changes. Cardiovascular Present- Shortness of Breath. Not Present- Chest Pain, Difficulty Breathing Lying Down, Leg Cramps, Palpitations, Rapid Heart Rate and Swelling of Extremities. Gastrointestinal Present- Change in Bowel Habits, Chronic diarrhea and Hemorrhoids. Not Present- Abdominal Pain, Bloating, Bloody Stool, Constipation, Difficulty Swallowing, Excessive gas, Gets full quickly at meals, Indigestion, Nausea, Rectal Pain and Vomiting. Female Genitourinary Not Present- Frequency, Nocturia, Painful Urination, Pelvic Pain and Urgency. Musculoskeletal Present- Back Pain. Not Present- Joint Pain, Joint Stiffness, Muscle Pain, Muscle Weakness and Swelling of Extremities. Neurological Not Present- Decreased Memory, Fainting, Headaches, Numbness, Seizures, Tingling, Tremor, Trouble walking and Weakness. Psychiatric Present- Depression. Not Present- Anxiety, Bipolar, Change in Sleep Pattern, Fearful and Frequent crying. Endocrine Not Present- Cold Intolerance, Excessive Hunger, Hair Changes, Heat Intolerance, Hot flashes and New Diabetes. Hematology Not Present- Blood Thinners, Easy Bruising, Excessive bleeding, Gland problems, HIV and Persistent Infections. All other systems negative  Vitals (Teresa Morgan; 03/08/2018 3:22 PM) 03/08/2018 3:21 PM Weight: 151.8 lb Height: 68in Body Surface Area: 1.82 m Body Mass Index: 23.08 kg/m  Temp.: 98.13F  Pulse: 96 (Regular)  BP: 122/78 (Sitting, Left Arm, Standard)       Physical Exam Teresa Morgan; 03/08/2018 4:15 PM) General Mental Status-Alert. General Appearance-Consistent with stated age. Hydration-Well hydrated. Voice-Normal.  Head and Neck Head-normocephalic, atraumatic with no lesions  or palpable masses.  Eye Sclera/Conjunctiva - Bilateral-No scleral icterus.  Chest and Lung Exam Chest and lung exam reveals -quiet, even and easy respiratory effort with no use of accessory muscles. Inspection Chest Wall - Normal. Back - normal.  Breast Note: no palpable masses. no nipple retraction or skin dimpling. breasts are symmetric bilaterally. no LAD. no nipple discharge. faint old bruising on medial right breast.   Cardiovascular Cardiovascular examination reveals -normal pedal pulses bilaterally. Note: regular rate and rhythm  Abdomen Inspection-Inspection Normal. Palpation/Percussion Palpation and Percussion of the abdomen reveal - Soft, Non Tender, No Rebound tenderness, No Rigidity (guarding) and No hepatosplenomegaly.  Peripheral Vascular Upper Extremity Inspection - Bilateral - Normal - No Clubbing, No Cyanosis, No Edema, Pulses Intact. Lower Extremity Palpation - Edema - Bilateral - No edema.  Neurologic Neurologic evaluation reveals -alert and oriented x 3 with no impairment of recent or remote memory. Mental Status-Normal.  Musculoskeletal Global Assessment -Note: no gross deformities.  Normal Exam - Left-Upper Extremity Strength Normal and Lower Extremity Strength Normal. Normal Exam - Right-Upper Extremity Strength Normal and Lower Extremity Strength Normal.  Lymphatic Head & Neck  General Head & Neck Lymphatics: Bilateral - Description - Normal. Axillary  General Axillary Region: Bilateral - Description - Normal. Tenderness - Non Tender.    Assessment & Plan Teresa Morgan; 03/08/2018 4:15 PM) ABNORMAL MAMMOGRAM OF RIGHT BREAST (R92.8) Impression: Will plan seed localized excisional biopsy.  The surgical procedure was described to the patient. I discussed the incision type and location and that we would need radiology involved on with a wire or seed marker and/or sentinel node.  The risks and benefits of the procedure  were described to the patient and she wishes to proceed.  We discussed the risks bleeding, infection, damage to other structures, need for further procedures/surgeries. We discussed the risk of seroma. The patient was advised if the area in the breast in cancer, we may need to go back to surgery for additional tissue to obtain negative margins or for  a lymph node biopsy. The patient was advised that these are the most common complications, but that others can occur as well. They were advised against taking aspirin or other anti-inflammatory agents/blood thinners the week before surgery. Current Plans Pt Education - [facility] Breast Biopsy HCI: discussed with patient and provided information. Schedule for Surgery   Signed by Teresa Klein, Morgan (03/08/2018 4:16 PM)

## 2018-03-14 ENCOUNTER — Ambulatory Visit
Admission: RE | Admit: 2018-03-14 | Discharge: 2018-03-14 | Disposition: A | Discharge status: Home / Self Care | Payer: 59 | Source: Ambulatory Visit | Attending: General Surgery | Admitting: General Surgery

## 2018-03-14 DIAGNOSIS — R928 Other abnormal and inconclusive findings on diagnostic imaging of breast: Secondary | ICD-10-CM

## 2018-03-14 NOTE — Progress Notes (Signed)
Ensure pre surgery drink given with instructions to complete by 1000 dos, pt verbalized understanding. 

## 2018-03-15 ENCOUNTER — Ambulatory Visit (HOSPITAL_BASED_OUTPATIENT_CLINIC_OR_DEPARTMENT_OTHER)
Admission: RE | Admit: 2018-03-15 | Discharge: 2018-03-15 | Disposition: A | Discharge status: Home / Self Care | Payer: 59 | Source: Ambulatory Visit | Attending: General Surgery | Admitting: General Surgery

## 2018-03-15 ENCOUNTER — Other Ambulatory Visit: Payer: Self-pay

## 2018-03-15 ENCOUNTER — Encounter (HOSPITAL_BASED_OUTPATIENT_CLINIC_OR_DEPARTMENT_OTHER)
Admission: RE | Disposition: A | Discharge status: Home / Self Care | Payer: Self-pay | Source: Ambulatory Visit | Attending: General Surgery

## 2018-03-15 ENCOUNTER — Ambulatory Visit
Admission: RE | Admit: 2018-03-15 | Discharge: 2018-03-15 | Disposition: A | Discharge status: Home / Self Care | Payer: 59 | Source: Ambulatory Visit | Attending: General Surgery | Admitting: General Surgery

## 2018-03-15 ENCOUNTER — Ambulatory Visit (HOSPITAL_BASED_OUTPATIENT_CLINIC_OR_DEPARTMENT_OTHER): Payer: 59 | Admitting: Anesthesiology

## 2018-03-15 ENCOUNTER — Encounter (HOSPITAL_BASED_OUTPATIENT_CLINIC_OR_DEPARTMENT_OTHER): Payer: Self-pay | Admitting: Anesthesiology

## 2018-03-15 DIAGNOSIS — J449 Chronic obstructive pulmonary disease, unspecified: Secondary | ICD-10-CM | POA: Insufficient documentation

## 2018-03-15 DIAGNOSIS — N6021 Fibroadenosis of right breast: Secondary | ICD-10-CM | POA: Insufficient documentation

## 2018-03-15 DIAGNOSIS — K219 Gastro-esophageal reflux disease without esophagitis: Secondary | ICD-10-CM | POA: Diagnosis not present

## 2018-03-15 DIAGNOSIS — Z87891 Personal history of nicotine dependence: Secondary | ICD-10-CM | POA: Insufficient documentation

## 2018-03-15 DIAGNOSIS — F418 Other specified anxiety disorders: Secondary | ICD-10-CM | POA: Diagnosis not present

## 2018-03-15 DIAGNOSIS — Z79899 Other long term (current) drug therapy: Secondary | ICD-10-CM | POA: Diagnosis not present

## 2018-03-15 DIAGNOSIS — R928 Other abnormal and inconclusive findings on diagnostic imaging of breast: Secondary | ICD-10-CM

## 2018-03-15 HISTORY — PX: RADIOACTIVE SEED GUIDED EXCISIONAL BREAST BIOPSY: SHX6490

## 2018-03-15 HISTORY — PX: BREAST EXCISIONAL BIOPSY: SUR124

## 2018-03-15 HISTORY — DX: Myoneural disorder, unspecified: G70.9

## 2018-03-15 SURGERY — RADIOACTIVE SEED GUIDED BREAST BIOPSY
Anesthesia: General | Site: Breast | Laterality: Right

## 2018-03-15 MED ORDER — GABAPENTIN 300 MG PO CAPS
300.0000 mg | ORAL_CAPSULE | ORAL | Status: AC
  Administered 2018-03-15: 300 mg via ORAL

## 2018-03-15 MED ORDER — KETOROLAC TROMETHAMINE 30 MG/ML IJ SOLN
INTRAMUSCULAR | Status: AC
  Filled 2018-03-15: qty 1

## 2018-03-15 MED ORDER — MEPERIDINE HCL 25 MG/ML IJ SOLN: 6.2500 mg | INTRAMUSCULAR | Status: DC | PRN

## 2018-03-15 MED ORDER — CEFAZOLIN SODIUM-DEXTROSE 2-3 GM-%(50ML) IV SOLR
INTRAVENOUS | Status: DC | PRN
  Administered 2018-03-15: 2 g via INTRAVENOUS

## 2018-03-15 MED ORDER — FENTANYL CITRATE (PF) 100 MCG/2ML IJ SOLN
INTRAMUSCULAR | Status: AC
  Filled 2018-03-15: qty 2

## 2018-03-15 MED ORDER — SCOPOLAMINE 1 MG/3DAYS TD PT72: 1.0000 | MEDICATED_PATCH | Freq: Once | TRANSDERMAL | Status: DC | PRN

## 2018-03-15 MED ORDER — FENTANYL CITRATE (PF) 100 MCG/2ML IJ SOLN
50.0000 ug | INTRAMUSCULAR | Status: AC | PRN
  Administered 2018-03-15: 100 ug via INTRAVENOUS
  Administered 2018-03-15 (×2): 25 ug via INTRAVENOUS

## 2018-03-15 MED ORDER — CHLORHEXIDINE GLUCONATE CLOTH 2 % EX PADS: 6.0000 | MEDICATED_PAD | Freq: Once | CUTANEOUS | Status: DC

## 2018-03-15 MED ORDER — PROMETHAZINE HCL 25 MG/ML IJ SOLN: 6.2500 mg | INTRAMUSCULAR | Status: DC | PRN

## 2018-03-15 MED ORDER — PROPOFOL 10 MG/ML IV BOLUS
INTRAVENOUS | Status: DC | PRN
  Administered 2018-03-15: 200 mg via INTRAVENOUS

## 2018-03-15 MED ORDER — DEXAMETHASONE SODIUM PHOSPHATE 10 MG/ML IJ SOLN
INTRAMUSCULAR | Status: AC
  Filled 2018-03-15: qty 1

## 2018-03-15 MED ORDER — ACETAMINOPHEN 500 MG PO TABS
ORAL_TABLET | ORAL | Status: AC
  Filled 2018-03-15: qty 2

## 2018-03-15 MED ORDER — LIDOCAINE HCL (CARDIAC) PF 100 MG/5ML IV SOSY
PREFILLED_SYRINGE | INTRAVENOUS | Status: AC
  Filled 2018-03-15: qty 5

## 2018-03-15 MED ORDER — MIDAZOLAM HCL 2 MG/2ML IJ SOLN
1.0000 mg | INTRAMUSCULAR | Status: DC | PRN
  Administered 2018-03-15: 2 mg via INTRAVENOUS

## 2018-03-15 MED ORDER — ACETAMINOPHEN 500 MG PO TABS
1000.0000 mg | ORAL_TABLET | ORAL | Status: AC
  Administered 2018-03-15: 1000 mg via ORAL

## 2018-03-15 MED ORDER — LACTATED RINGERS IV SOLN
INTRAVENOUS | Status: DC
  Administered 2018-03-15 (×2): via INTRAVENOUS

## 2018-03-15 MED ORDER — KETOROLAC TROMETHAMINE 30 MG/ML IJ SOLN
15.0000 mg | Freq: Once | INTRAMUSCULAR | Status: AC
  Administered 2018-03-15: 15 mg via INTRAVENOUS

## 2018-03-15 MED ORDER — PROPOFOL 10 MG/ML IV BOLUS
INTRAVENOUS | Status: AC
  Filled 2018-03-15: qty 20

## 2018-03-15 MED ORDER — LIDOCAINE HCL (CARDIAC) PF 100 MG/5ML IV SOSY
PREFILLED_SYRINGE | INTRAVENOUS | Status: DC | PRN
  Administered 2018-03-15: 100 mg via INTRAVENOUS

## 2018-03-15 MED ORDER — EPHEDRINE SULFATE 50 MG/ML IJ SOLN
INTRAMUSCULAR | Status: DC | PRN
  Administered 2018-03-15: 10 mg via INTRAVENOUS
  Administered 2018-03-15: 5 mg via INTRAVENOUS

## 2018-03-15 MED ORDER — OXYCODONE HCL 5 MG PO TABS
5.0000 mg | ORAL_TABLET | Freq: Four times a day (QID) | ORAL | 0 refills | Status: DC | PRN
Start: 2018-03-15 — End: 2018-10-12

## 2018-03-15 MED ORDER — CEFAZOLIN SODIUM-DEXTROSE 2-4 GM/100ML-% IV SOLN
2.0000 g | INTRAVENOUS | Status: AC
  Administered 2018-03-15: 2 g via INTRAVENOUS

## 2018-03-15 MED ORDER — FENTANYL CITRATE (PF) 100 MCG/2ML IJ SOLN
25.0000 ug | INTRAMUSCULAR | Status: DC | PRN
  Administered 2018-03-15: 25 ug via INTRAVENOUS
  Administered 2018-03-15: 50 ug via INTRAVENOUS

## 2018-03-15 MED ORDER — BUPIVACAINE-EPINEPHRINE 0.5% -1:200000 IJ SOLN
INTRAMUSCULAR | Status: DC | PRN
  Administered 2018-03-15: 10 mL

## 2018-03-15 MED ORDER — ONDANSETRON HCL 4 MG/2ML IJ SOLN
INTRAMUSCULAR | Status: AC
  Filled 2018-03-15: qty 2

## 2018-03-15 MED ORDER — OXYCODONE HCL 5 MG PO TABS
5.0000 mg | ORAL_TABLET | Freq: Once | ORAL | Status: AC | PRN
  Administered 2018-03-15: 5 mg via ORAL

## 2018-03-15 MED ORDER — LACTATED RINGERS IV SOLN: INTRAVENOUS | Status: DC

## 2018-03-15 MED ORDER — 0.9 % SODIUM CHLORIDE (POUR BTL) OPTIME
TOPICAL | Status: DC | PRN
  Administered 2018-03-15: 300 mL

## 2018-03-15 MED ORDER — LIDOCAINE HCL (PF) 1 % IJ SOLN
INTRAMUSCULAR | Status: DC | PRN
  Administered 2018-03-15: 10 mL

## 2018-03-15 MED ORDER — OXYCODONE HCL 5 MG/5ML PO SOLN: 5.0000 mg | Freq: Once | ORAL | Status: AC | PRN

## 2018-03-15 MED ORDER — MIDAZOLAM HCL 2 MG/2ML IJ SOLN
INTRAMUSCULAR | Status: AC
  Filled 2018-03-15: qty 2

## 2018-03-15 MED ORDER — CEFAZOLIN SODIUM-DEXTROSE 2-4 GM/100ML-% IV SOLN
INTRAVENOUS | Status: AC
  Filled 2018-03-15: qty 100

## 2018-03-15 MED ORDER — OXYCODONE HCL 5 MG PO TABS
ORAL_TABLET | ORAL | Status: AC
  Filled 2018-03-15: qty 1

## 2018-03-15 MED ORDER — GABAPENTIN 300 MG PO CAPS
ORAL_CAPSULE | ORAL | Status: AC
  Filled 2018-03-15: qty 1

## 2018-03-15 SURGICAL SUPPLY — 51 items
BINDER BREAST LRG (GAUZE/BANDAGES/DRESSINGS) ×3 IMPLANT
BLADE SURG 10 STRL SS (BLADE) ×3 IMPLANT
BLADE SURG 15 STRL LF DISP TIS (BLADE) ×1 IMPLANT
BLADE SURG 15 STRL SS (BLADE) ×3
CANISTER SUC SOCK COL 7IN (MISCELLANEOUS) IMPLANT
CANISTER SUCT 1200ML W/VALVE (MISCELLANEOUS) ×3 IMPLANT
CHLORAPREP W/TINT 26ML (MISCELLANEOUS) ×3 IMPLANT
CLIP VESOCCLUDE LG 6/CT (CLIP) IMPLANT
CLOSURE WOUND 1/2 X4 (GAUZE/BANDAGES/DRESSINGS) ×1
COVER BACK TABLE 60X90IN (DRAPES) ×3 IMPLANT
COVER MAYO STAND STRL (DRAPES) ×3 IMPLANT
COVER PROBE W GEL 5X96 (DRAPES) ×3 IMPLANT
DECANTER SPIKE VIAL GLASS SM (MISCELLANEOUS) IMPLANT
DERMABOND ADVANCED (GAUZE/BANDAGES/DRESSINGS) ×2
DERMABOND ADVANCED .7 DNX12 (GAUZE/BANDAGES/DRESSINGS) ×1 IMPLANT
DEVICE DUBIN W/COMP PLATE 8390 (MISCELLANEOUS) ×3 IMPLANT
DRAPE LAPAROSCOPIC ABDOMINAL (DRAPES) ×3 IMPLANT
DRAPE UTILITY XL STRL (DRAPES) ×3 IMPLANT
ELECT COATED BLADE 2.86 ST (ELECTRODE) ×3 IMPLANT
ELECT REM PT RETURN 9FT ADLT (ELECTROSURGICAL) ×3
ELECTRODE REM PT RTRN 9FT ADLT (ELECTROSURGICAL) ×1 IMPLANT
GAUZE SPONGE 4X4 12PLY STRL LF (GAUZE/BANDAGES/DRESSINGS) ×3 IMPLANT
GLOVE BIO SURGEON STRL SZ 6 (GLOVE) ×3 IMPLANT
GLOVE BIOGEL PI IND STRL 6.5 (GLOVE) ×1 IMPLANT
GLOVE BIOGEL PI INDICATOR 6.5 (GLOVE) ×2
GOWN STRL REUS W/ TWL LRG LVL3 (GOWN DISPOSABLE) ×1 IMPLANT
GOWN STRL REUS W/TWL 2XL LVL3 (GOWN DISPOSABLE) ×3 IMPLANT
GOWN STRL REUS W/TWL LRG LVL3 (GOWN DISPOSABLE) ×3
KIT MARKER MARGIN INK (KITS) ×3 IMPLANT
LIGHT WAVEGUIDE WIDE FLAT (MISCELLANEOUS) IMPLANT
NEEDLE HYPO 25X1 1.5 SAFETY (NEEDLE) ×3 IMPLANT
NS IRRIG 1000ML POUR BTL (IV SOLUTION) ×3 IMPLANT
PACK BASIN DAY SURGERY FS (CUSTOM PROCEDURE TRAY) ×3 IMPLANT
PENCIL BUTTON HOLSTER BLD 10FT (ELECTRODE) ×3 IMPLANT
SLEEVE SCD COMPRESS KNEE MED (MISCELLANEOUS) ×3 IMPLANT
SPONGE LAP 18X18 RF (DISPOSABLE) ×3 IMPLANT
STAPLER VISISTAT 35W (STAPLE) IMPLANT
STRIP CLOSURE SKIN 1/2X4 (GAUZE/BANDAGES/DRESSINGS) ×2 IMPLANT
SUT MON AB 4-0 PC3 18 (SUTURE) ×3 IMPLANT
SUT SILK 2 0 SH (SUTURE) IMPLANT
SUT VIC AB 2-0 SH 18 (SUTURE) IMPLANT
SUT VIC AB 3-0 SH 27 (SUTURE) ×3
SUT VIC AB 3-0 SH 27X BRD (SUTURE) ×1 IMPLANT
SUT VICRYL 3-0 CR8 SH (SUTURE) IMPLANT
SYR BULB 3OZ (MISCELLANEOUS) ×3 IMPLANT
SYR CONTROL 10ML LL (SYRINGE) ×3 IMPLANT
TOWEL OR 17X24 6PK STRL BLUE (TOWEL DISPOSABLE) ×3 IMPLANT
TOWEL OR NON WOVEN STRL DISP B (DISPOSABLE) IMPLANT
TUBE CONNECTING 20'X1/4 (TUBING) ×1
TUBE CONNECTING 20X1/4 (TUBING) ×2 IMPLANT
YANKAUER SUCT BULB TIP NO VENT (SUCTIONS) ×3 IMPLANT

## 2018-03-15 NOTE — Anesthesia Preprocedure Evaluation (Addendum)
Anesthesia Evaluation  Patient identified by MRN, date of birth, ID band Patient awake    Reviewed: Allergy & Precautions, NPO status , Patient's Chart, lab work & pertinent test results  Airway Mallampati: II  TM Distance: >3 FB Neck ROM: Full    Dental  (+) Teeth Intact, Dental Advisory Given   Pulmonary COPD,  COPD inhaler, former smoker,     + decreased breath sounds      Cardiovascular negative cardio ROS   Rhythm:Regular Rate:Normal     Neuro/Psych PSYCHIATRIC DISORDERS Anxiety Depression  Neuromuscular disease    GI/Hepatic Neg liver ROS, PUD, GERD  ,  Endo/Other  negative endocrine ROS  Renal/GU negative Renal ROS     Musculoskeletal  (+) Arthritis , Osteoarthritis,    Abdominal Normal abdominal exam  (+)   Peds  Hematology   Anesthesia Other Findings   Reproductive/Obstetrics                            Lab Results  Component Value Date   WBC 7.9 02/12/2018   HGB 13.0 02/12/2018   HCT 41.4 02/12/2018   MCV 72.6 (L) 02/12/2018   PLT 195.0 02/12/2018   Lab Results  Component Value Date   CREATININE 0.86 01/22/2018   BUN 12 01/22/2018   NA 138 01/22/2018   K 4.4 01/22/2018   CL 103 01/22/2018   CO2 26 01/22/2018   Lab Results  Component Value Date   INR 1.03 06/27/2011   EKG: atrial flutter.  03/15/18 Repeat EKG: normal sinus rhythm, PAC's noted.   Anesthesia Physical Anesthesia Plan  ASA: II  Anesthesia Plan: General   Post-op Pain Management:    Induction: Intravenous  PONV Risk Score and Plan: 4 or greater and Ondansetron, Dexamethasone, Midazolam and Scopolamine patch - Pre-op  Airway Management Planned: LMA  Additional Equipment: None  Intra-op Plan:   Post-operative Plan: Extubation in OR  Informed Consent: I have reviewed the patients History and Physical, chart, labs and discussed the procedure including the risks, benefits and alternatives for  the proposed anesthesia with the patient or authorized representative who has indicated his/her understanding and acceptance.   Dental advisory given  Plan Discussed with: CRNA  Anesthesia Plan Comments:        Anesthesia Quick Evaluation

## 2018-03-15 NOTE — Anesthesia Postprocedure Evaluation (Signed)
Anesthesia Post Note  Patient: Teresa Morgan  Procedure(s) Performed: RADIOACTIVE SEED GUIDED EXCISIONAL BREAST BIOPSY (Right Breast)     Patient location during evaluation: PACU Anesthesia Type: General Level of consciousness: awake and alert Pain management: pain level controlled Vital Signs Assessment: post-procedure vital signs reviewed and stable Respiratory status: spontaneous breathing, nonlabored ventilation, respiratory function stable and patient connected to nasal cannula oxygen Cardiovascular status: blood pressure returned to baseline and stable Postop Assessment: no apparent nausea or vomiting Anesthetic complications: no    Last Vitals:  Vitals:   03/15/18 1715 03/15/18 1730  BP: 132/74 135/77  Pulse: 82 71  Resp: 20 12  Temp:    SpO2: 100% 96%                  Effie Berkshire

## 2018-03-15 NOTE — Anesthesia Procedure Notes (Signed)
Procedure Name: LMA Insertion Performed by: Derell Bruun M, CRNA Pre-anesthesia Checklist: Patient identified, Emergency Drugs available, Suction available, Patient being monitored and Timeout performed Patient Re-evaluated:Patient Re-evaluated prior to induction Oxygen Delivery Method: Circle system utilized Preoxygenation: Pre-oxygenation with 100% oxygen Induction Type: IV induction LMA: LMA inserted LMA Size: 4.0 Placement Confirmation: positive ETCO2,  CO2 detector and breath sounds checked- equal and bilateral Tube secured with: Tape Dental Injury: Teeth and Oropharynx as per pre-operative assessment        

## 2018-03-15 NOTE — Discharge Instructions (Addendum)
[place] Office Phone Number [phone]  BREAST BIOPSY/ PARTIAL MASTECTOMY: POST OP INSTRUCTIONS  Always review your discharge instruction sheet given to you by the facility where your surgery was performed.  IF YOU HAVE DISABILITY OR FAMILY LEAVE FORMS, YOU MUST BRING THEM TO THE OFFICE FOR PROCESSING.  DO NOT GIVE THEM TO YOUR DOCTOR.  1. A prescription for pain medication may be given to you upon discharge.  Take your pain medication as prescribed, if needed.  If narcotic pain medicine is not needed, then you may take acetaminophen (Tylenol) or ibuprofen (Advil) as needed. No ibuprofen until midnight. Tylenol given at 1pm, Oxycodone 5mg  given at 6pm. 2. Take your usually prescribed medications unless otherwise directed 3. If you need a refill on your pain medication, please contact your pharmacy.  They will contact our office to request authorization.  Prescriptions will not be filled after 5pm or on week-ends. 4. You should eat very light the first 24 hours after surgery, such as soup, crackers, pudding, etc.  Resume your normal diet the day after surgery. 5. Most patients will experience some swelling and bruising in the breast.  Ice packs and a good support bra will help.  Swelling and bruising can take several days to resolve.  6. It is common to experience some constipation if taking pain medication after surgery.  Increasing fluid intake and taking a stool softener will usually help or prevent this problem from occurring.  A mild laxative (Milk of Magnesia or Miralax) should be taken according to package directions if there are no bowel movements after 48 hours. 7. Unless discharge instructions indicate otherwise, you may remove your bandages 48 hours after surgery, and you may shower at that time.  You may have steri-strips (small skin tapes) in place directly over the incision.  These strips should be left on the skin for 7-10 days.   Any sutures or staples will be  removed at the office during your follow-up visit. 8. ACTIVITIES:  You may resume regular daily activities (gradually increasing) beginning the next day.  Wearing a good support bra or sports bra (or the breast binder) minimizes pain and swelling.  You may have sexual intercourse when it is comfortable. a. You may drive when you no longer are taking prescription pain medication, you can comfortably wear a seatbelt, and you can safely maneuver your car and apply brakes. b. RETURN TO WORK:  __________1 week_______________ 9. You should see your doctor in the office for a follow-up appointment approximately two weeks after your surgery.  Your doctors nurse will typically make your follow-up appointment when she calls you with your pathology report.  Expect your pathology report 2-3 business days after your surgery.  You may call to check if you do not hear from [place] after three days.   WHEN TO CALL YOUR DOCTOR: 1. Fever over 101.0 2. Nausea and/or vomiting. 3. Extreme swelling or bruising. 4. Continued bleeding from incision. 5. Increased pain, redness, or drainage from the incision.  The clinic staff is available to answer your questions during regular business hours.  Please dont hesitate to call and ask to speak to one of the nurses for clinical concerns.  If you have a medical emergency, go to the nearest emergency room or call 911.  A surgeon from [facility] is always on call at the hospital.  For further questions, please visit [email or web address]     Post Anesthesia Home Care Instructions  Activity: Get plenty of rest for  the remainder of the day. A responsible individual must stay with you for 24 hours following the procedure.  For the next 24 hours, DO NOT: -Drive a car -Paediatric nurse -Drink alcoholic beverages -Take any medication unless instructed by your physician -Make any legal decisions or sign important papers.  Meals: Start with liquid foods such  as gelatin or soup. Progress to regular foods as tolerated. Avoid greasy, spicy, heavy foods. If nausea and/or vomiting occur, drink only clear liquids until the nausea and/or vomiting subsides. Call your physician if vomiting continues.  Special Instructions/Symptoms: Your throat may feel dry or sore from the anesthesia or the breathing tube placed in your throat during surgery. If this causes discomfort, gargle with warm salt water. The discomfort should disappear within 24 hours.  If you had a scopolamine patch placed behind your ear for the management of post- operative nausea and/or vomiting:  1. The medication in the patch is effective for 72 hours, after which it should be removed.  Wrap patch in a tissue and discard in the trash. Wash hands thoroughly with soap and water. 2. You may remove the patch earlier than 72 hours if you experience unpleasant side effects which may include dry mouth, dizziness or visual disturbances. 3. Avoid touching the patch. Wash your hands with soap and water after contact with the patch.

## 2018-03-15 NOTE — Transfer of Care (Signed)
Immediate Anesthesia Transfer of Care Note  Patient: Teresa Morgan  Procedure(s) Performed: RADIOACTIVE SEED GUIDED EXCISIONAL BREAST BIOPSY (Right Breast)  Patient Location: PACU  Anesthesia Type:General  Level of Consciousness: awake, alert  and oriented  Airway & Oxygen Therapy: Patient Spontanous Breathing and Patient connected to face mask oxygen  Post-op Assessment: Report given to RN and Post -op Vital signs reviewed and stable  Post vital signs: Reviewed and stable  Last Vitals:  Vitals Value Taken Time  BP    Temp    Pulse    Resp    SpO2      Last Pain:  Vitals:   03/15/18 1230  TempSrc: Oral         Complications: No apparent anesthesia complications

## 2018-03-15 NOTE — Interval H&P Note (Signed)
History and Physical Interval Note:  03/15/2018 3:49 PM  Teresa Morgan  has presented today for surgery, with the diagnosis of RIGHT ABNORMAL MAMMOGRAM  The various methods of treatment have been discussed with the patient and family. After consideration of risks, benefits and other options for treatment, the patient has consented to  Procedure(s): RADIOACTIVE SEED GUIDED EXCISIONAL BREAST BIOPSY (Right) as a surgical intervention .  The patient's history has been reviewed, patient examined, no change in status, stable for surgery.  I have reviewed the patient's chart and labs.  Questions were answered to the patient's satisfaction.     [name]

## 2018-03-15 NOTE — Op Note (Signed)
Right Breast Radioactive seed localized excisional biopsy  Indications: This patient presents with history of abnormal right mammogram, core needle biopsy showed complex sclerosing lesion which was discordant.    Pre-operative Diagnosis: abnormal right mammogram  Post-operative Diagnosis: Same  Surgeon: [name]   Anesthesia: General endotracheal anesthesia  ASA Class: 2  Procedure Details  The patient was seen in the Holding Room. The risks, benefits, complications, treatment options, and expected outcomes were discussed with the patient. The possibilities of bleeding, infection, the need for additional procedures, failure to diagnose a condition, and creating a complication requiring transfusion or operation were discussed with the patient. The patient concurred with the proposed plan, giving informed consent.  The site of surgery properly noted/marked. The patient was taken to Operating Room # 2, identified, and the procedure verified as Right Breast Seed Localized excisional biopsy. A Time Out was held and the above information confirmed.  The right breast and chest were prepped and draped in standard fashion. The lumpectomy was performed by creating a medial circumareola incision near the previously placed radioactive seed.  Dissection was carried down to around the point of maximum signal intensity. The cautery was used to perform the dissection.  Hemostasis was achieved with cautery.  The specimen was inked with the margin marker paint kit.    Specimen radiography confirmed inclusion of the mammographic lesion, the clip, and the seed.  The background signal in the breast was zero.  The wound was irrigated and closed with 3-0 vicryl in layers and 4-0 monocryl subcuticular suture.      Sterile dressings were applied. At the end of the operation, all sponge, instrument, and needle counts were correct.  Findings: grossly clear surgical margins and no adenopathy, dense central breast  tissue  Estimated Blood Loss:  min         Specimens: right breast tissue with seed.         Complications:  None; patient tolerated the procedure well.         Disposition: PACU - hemodynamically stable.         Condition: stable

## 2018-03-16 ENCOUNTER — Encounter (HOSPITAL_BASED_OUTPATIENT_CLINIC_OR_DEPARTMENT_OTHER): Payer: Self-pay | Admitting: General Surgery

## 2018-03-16 ENCOUNTER — Other Ambulatory Visit: Payer: Self-pay | Admitting: *Deleted

## 2018-03-16 DIAGNOSIS — C180 Malignant neoplasm of cecum: Secondary | ICD-10-CM

## 2018-03-16 DIAGNOSIS — J430 Unilateral pulmonary emphysema [MacLeod's syndrome]: Secondary | ICD-10-CM

## 2018-03-16 DIAGNOSIS — G629 Polyneuropathy, unspecified: Secondary | ICD-10-CM

## 2018-03-16 MED ORDER — OXYCODONE-ACETAMINOPHEN 10-325 MG PO TABS: ORAL_TABLET | ORAL | 0 refills | Status: DC

## 2018-03-16 MED FILL — OXYCODONE-APAP 10-325: 10-325 | 10 days supply | Qty: 120 | Fill #0

## 2018-03-19 ENCOUNTER — Other Ambulatory Visit: Payer: Self-pay | Admitting: Family

## 2018-03-19 DIAGNOSIS — J430 Unilateral pulmonary emphysema [MacLeod's syndrome]: Secondary | ICD-10-CM

## 2018-03-19 DIAGNOSIS — G629 Polyneuropathy, unspecified: Secondary | ICD-10-CM

## 2018-03-19 DIAGNOSIS — C180 Malignant neoplasm of cecum: Secondary | ICD-10-CM

## 2018-03-19 MED FILL — BREO ELLIPTA 100-25 MCG INH: 100-25 | 30 days supply | Qty: 60 | Fill #1

## 2018-03-19 MED FILL — GABAPENTIN 600 MG TABS: 600 | 30 days supply | Qty: 120 | Fill #0

## 2018-03-19 NOTE — Progress Notes (Signed)
Please let this patient know that biopsy was benign.

## 2018-03-26 ENCOUNTER — Other Ambulatory Visit: Payer: Self-pay

## 2018-03-26 ENCOUNTER — Ambulatory Visit (AMBULATORY_SURGERY_CENTER): Payer: Self-pay | Admitting: *Deleted

## 2018-03-26 VITALS — Ht 68.0 in | Wt 152.8 lb

## 2018-03-26 DIAGNOSIS — Z85038 Personal history of other malignant neoplasm of large intestine: Secondary | ICD-10-CM

## 2018-03-26 MED ORDER — PEG 3350-KCL-NA BICARB-NACL 420 G PO SOLR: 4000.0000 mL | Freq: Once | ORAL | 0 refills | Status: AC

## 2018-03-26 MED FILL — PEG-3350 SOLUTION: 420 | 1 days supply | Qty: 4000 | Fill #0

## 2018-03-26 NOTE — Progress Notes (Signed)
No egg or soy allergy known to patient  No issues with past sedation with any surgeries  or procedures, no intubation problems  No diet pills per patient No home 02 use per patient  No blood thinners per patient  Pt denies issues with constipation  No A fib or A flutter  EMMI video sent to pt's e mail  Pt had lumpectomy 5-2 that was benign and asked to RS colon a few weeks out- RS for 6-10 Monday at 9 am with pt in [place]

## 2018-03-28 ENCOUNTER — Other Ambulatory Visit: Payer: Self-pay | Admitting: *Deleted

## 2018-03-28 DIAGNOSIS — G629 Polyneuropathy, unspecified: Secondary | ICD-10-CM

## 2018-03-28 DIAGNOSIS — J430 Unilateral pulmonary emphysema [MacLeod's syndrome]: Secondary | ICD-10-CM

## 2018-03-28 DIAGNOSIS — C180 Malignant neoplasm of cecum: Secondary | ICD-10-CM

## 2018-03-28 MED ORDER — ALPRAZOLAM 1 MG PO TABS: 1.0000 mg | ORAL_TABLET | Freq: Every evening | ORAL | 2 refills | Status: DC | PRN

## 2018-03-28 MED FILL — TRINTELLIX 5 MG TABLET: 5 | 30 days supply | Qty: 30 | Fill #2

## 2018-03-28 MED FILL — ALPRAZolam 1 MG TABS: 1 | 30 days supply | Qty: 30 | Fill #0

## 2018-04-06 ENCOUNTER — Encounter: Payer: Self-pay | Admitting: Gastroenterology

## 2018-04-12 ENCOUNTER — Other Ambulatory Visit: Payer: Self-pay | Admitting: *Deleted

## 2018-04-12 DIAGNOSIS — C180 Malignant neoplasm of cecum: Secondary | ICD-10-CM

## 2018-04-12 DIAGNOSIS — G629 Polyneuropathy, unspecified: Secondary | ICD-10-CM

## 2018-04-12 DIAGNOSIS — J430 Unilateral pulmonary emphysema [MacLeod's syndrome]: Secondary | ICD-10-CM

## 2018-04-12 MED ORDER — OXYCODONE-ACETAMINOPHEN 10-325 MG PO TABS: ORAL_TABLET | ORAL | 0 refills | Status: DC

## 2018-04-12 MED FILL — OXYCODONE-APAP 10-325: 10-325 | 20 days supply | Qty: 120 | Fill #0

## 2018-04-23 ENCOUNTER — Other Ambulatory Visit: Payer: Self-pay

## 2018-04-23 ENCOUNTER — Encounter: Payer: Self-pay | Admitting: Gastroenterology

## 2018-04-23 ENCOUNTER — Ambulatory Visit (AMBULATORY_SURGERY_CENTER): Payer: 59 | Admitting: Gastroenterology

## 2018-04-23 VITALS — BP 116/70 | HR 80 | Temp 96.8°F | Resp 16 | Ht 68.0 in | Wt 152.0 lb

## 2018-04-23 DIAGNOSIS — Z85038 Personal history of other malignant neoplasm of large intestine: Secondary | ICD-10-CM

## 2018-04-23 DIAGNOSIS — K5730 Diverticulosis of large intestine without perforation or abscess without bleeding: Secondary | ICD-10-CM | POA: Diagnosis not present

## 2018-04-23 MED ORDER — SODIUM CHLORIDE 0.9 % IV SOLN: 500.0000 mL | Freq: Once | INTRAVENOUS | Status: DC

## 2018-04-23 NOTE — Op Note (Signed)
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 Juanita Craver 05/2011. She had repeat colonoscopy after 1                            year interval (per patient) and it was normal. Medicines:                Monitored Anesthesia Care Procedure:                Pre-Anesthesia Assessment:                           - Prior to the procedure, a History and Physical                            was performed, and patient medications and                            allergies were reviewed. The patient's tolerance of                            previous anesthesia was also reviewed. The risks                            and benefits of the procedure and the sedation                            options and risks were discussed with the patient.                            All questions were answered, and informed consent                            was obtained. Prior Anticoagulants: The patient has                            taken no previous anticoagulant or antiplatelet  agents. ASA Grade Assessment: II - A patient with                            mild systemic disease. After reviewing the risks                            and benefits, the patient was deemed in                            satisfactory condition to undergo the procedure.                           After obtaining informed consent, the colonoscope                            was passed under direct  vision. Throughout the                            procedure, the patient's blood pressure, pulse, and                            oxygen saturations were monitored continuously. The                            Colonoscope was introduced through the anus and                            advanced to the the ileocolonic anastomosis. The                            colonoscopy was performed without difficulty. The                            patient tolerated the procedure well. The quality                            of the bowel preparation was good. The rectum was                            photographed. Scope In: 8:40:14 AM Scope Out: 8:49:36 AM Scope Withdrawal Time: 0 hours 5 minutes 44 seconds  Total Procedure Duration: 0 hours 9 minutes 22 seconds  Findings:                 Normal right sided ileocolonic anastomosis.                           Multiple medium-mouthed diverticula were found in                            the left colon.                           The exam was otherwise without abnormality on  direct and retroflexion views. Complications:            No immediate complications. Estimated blood loss:                            None. Estimated Blood Loss:     Estimated blood loss: none. Impression:               - Diverticulosis in the left colon.                           - Normal right sided ileocolonic anastomosis.                           - No polyps or cancers. Recommendation:           - Patient has a contact number available for                            emergencies. The signs and symptoms of potential                            delayed complications were discussed with the                            patient. Return to normal activities tomorrow.                            Written discharge instructions were provided to the                            patient.                           - Resume previous diet.                           - Continue  present medications.                           - Repeat colonoscopy in 5 years for surveillance. Milus Banister, MD 04/23/2018 8:53:16 AM This report has been signed electronically.

## 2018-04-23 NOTE — Progress Notes (Signed)
Report given to PACU, vss 

## 2018-04-23 NOTE — Patient Instructions (Signed)
YOU HAD AN ENDOSCOPIC PROCEDURE TODAY AT THE [facility]:   Refer to the procedure report that was given to you for any specific questions about what was found during the examination.  If the procedure report does not answer your questions, please call your gastroenterologist to clarify.  If you requested that your care partner not be given the details of your procedure findings, then the procedure report has been included in a sealed envelope for you to review at your convenience later.  YOU SHOULD EXPECT: Some feelings of bloating in the abdomen. Passage of more gas than usual.  Walking can help get rid of the air that was put into your GI tract during the procedure and reduce the bloating. If you had a lower endoscopy (such as a colonoscopy or flexible sigmoidoscopy) you may notice spotting of blood in your stool or on the toilet paper. If you underwent a bowel prep for your procedure, you may not have a normal bowel movement for a few days.  Please Note:  You might notice some irritation and congestion in your nose or some drainage.  This is from the oxygen used during your procedure.  There is no need for concern and it should clear up in a day or so.  SYMPTOMS TO REPORT IMMEDIATELY:   Following lower endoscopy (colonoscopy or flexible sigmoidoscopy):  Excessive amounts of blood in the stool  Significant tenderness or worsening of abdominal pains  Swelling of the abdomen that is new, acute  Fever of 100F or higher  For urgent or emergent issues, a gastroenterologist can be reached at any hour by calling [phone].   DIET:  We do recommend a small meal at first, but then you may proceed to your regular diet.  Drink plenty of fluids but you should avoid alcoholic beverages for 24 hours.  ACTIVITY:  You should plan to take it easy for the rest of today and you should NOT DRIVE or use heavy machinery until tomorrow (because of the sedation medicines used during the test).     FOLLOW UP: Our staff will call the number listed on your records the next business day following your procedure to check on you and address any questions or concerns that you may have regarding the information given to you following your procedure. If we do not reach you, we will leave a message.  However, if you are feeling well and you are not experiencing any problems, there is no need to return our call.  We will assume that you have returned to your regular daily activities without incident.  If any biopsies were taken you will be contacted by phone or by letter within the next 1-3 weeks.  Please call us at [phone] if you have not heard about the biopsies in 3 weeks.    SIGNATURES/CONFIDENTIALITY: You and/or your care partner have signed paperwork which will be entered into your electronic medical record.  These signatures attest to the fact that that the information above on your After Visit Summary has been reviewed and is understood.  Full responsibility of the confidentiality of this discharge information lies with you and/or your care-partner. 

## 2018-04-24 ENCOUNTER — Telehealth: Payer: Self-pay | Admitting: *Deleted

## 2018-04-24 NOTE — Telephone Encounter (Signed)
  Follow up Call-  Call back number 04/23/2018  Post procedure Call Back phone  # [phone]  Permission to leave phone message Yes  Some recent data might be hidden     Patient questions:  Do you have a fever, pain , or abdominal swelling? No. Pain Score  0 *  Have you tolerated food without any problems? Yes.    Have you been able to return to your normal activities? Yes.    Do you have any questions about your discharge instructions: Diet   No. Medications  No. Follow up visit  No.  Do you have questions or concerns about your Care? No.  Actions: * If pain score is 4 or above: No action needed, pain <4.

## 2018-04-24 NOTE — Telephone Encounter (Signed)
No answer, message left for the patient. 

## 2018-04-27 MED FILL — TRINTELLIX 5 MG TABLET: 5 | 30 days supply | Qty: 30 | Fill #3

## 2018-04-27 MED FILL — ALPRAZolam 1 MG TABS: 1 | 30 days supply | Qty: 30 | Fill #1

## 2018-05-08 ENCOUNTER — Other Ambulatory Visit: Payer: Self-pay | Admitting: *Deleted

## 2018-05-08 DIAGNOSIS — G629 Polyneuropathy, unspecified: Secondary | ICD-10-CM

## 2018-05-08 DIAGNOSIS — J430 Unilateral pulmonary emphysema [MacLeod's syndrome]: Secondary | ICD-10-CM

## 2018-05-08 DIAGNOSIS — C180 Malignant neoplasm of cecum: Secondary | ICD-10-CM

## 2018-05-08 MED ORDER — OXYCODONE-ACETAMINOPHEN 10-325 MG PO TABS: ORAL_TABLET | ORAL | 0 refills | Status: DC

## 2018-05-08 MED FILL — OXYCODONE-APAP 10-325: 10-325 | 10 days supply | Qty: 120 | Fill #0

## 2018-05-22 MED FILL — GABAPENTIN 600 MG TABS: 600 | 30 days supply | Qty: 120 | Fill #1

## 2018-05-28 MED FILL — TRINTELLIX 5 MG TABLET: 5 | 30 days supply | Qty: 30 | Fill #4

## 2018-05-28 MED FILL — ALPRAZolam 1 MG TABS: 1 | 30 days supply | Qty: 30 | Fill #2

## 2018-06-01 ENCOUNTER — Other Ambulatory Visit: Payer: Self-pay | Admitting: *Deleted

## 2018-06-01 DIAGNOSIS — J430 Unilateral pulmonary emphysema [MacLeod's syndrome]: Secondary | ICD-10-CM

## 2018-06-01 DIAGNOSIS — G629 Polyneuropathy, unspecified: Secondary | ICD-10-CM

## 2018-06-01 DIAGNOSIS — C180 Malignant neoplasm of cecum: Secondary | ICD-10-CM

## 2018-06-01 MED ORDER — OXYCODONE-ACETAMINOPHEN 10-325 MG PO TABS: ORAL_TABLET | ORAL | 0 refills | Status: DC

## 2018-06-01 MED FILL — OXYCODONE-APAP 10-325: 10-325 | 10 days supply | Qty: 120 | Fill #0

## 2018-06-09 ENCOUNTER — Ambulatory Visit (INDEPENDENT_AMBULATORY_CARE_PROVIDER_SITE_OTHER): Payer: 59

## 2018-06-09 ENCOUNTER — Ambulatory Visit (HOSPITAL_COMMUNITY)
Admission: EM | Admit: 2018-06-09 | Discharge: 2018-06-09 | Disposition: A | Discharge status: Home / Self Care | Payer: 59 | Attending: Internal Medicine | Admitting: Internal Medicine

## 2018-06-09 ENCOUNTER — Other Ambulatory Visit: Payer: Self-pay

## 2018-06-09 DIAGNOSIS — R2242 Localized swelling, mass and lump, left lower limb: Secondary | ICD-10-CM | POA: Diagnosis not present

## 2018-06-09 DIAGNOSIS — M79672 Pain in left foot: Secondary | ICD-10-CM

## 2018-06-09 DIAGNOSIS — S93602A Unspecified sprain of left foot, initial encounter: Secondary | ICD-10-CM

## 2018-06-09 NOTE — Discharge Instructions (Addendum)
No fracture Use anti-inflammatories for pain/swelling. You may take up to 800 mg Ibuprofen every 8 hours with food. You may supplement Ibuprofen with Tylenol [phone] mg every 8 hours.   Ice and elevate  Weight bear as toelrated

## 2018-06-09 NOTE — ED Triage Notes (Addendum)
Per pt she hurt her left foot during swimming, she jumped in a 3 ft pool and landed on her left foot. Took hydrocodone pain meds 1 hour ago.
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
## 2018-06-22 ENCOUNTER — Telehealth: Payer: Self-pay

## 2018-06-22 NOTE — Telephone Encounter (Signed)
Received VM from pt requesting refill on Percocet. In message pt states she is aware this request is early, but she had a recent fall so has been utilizing meds more frequently. Per Dr Marin Olp, he will NOT refill script early. This information left on pt's VM. dph

## 2018-06-25 ENCOUNTER — Other Ambulatory Visit: Payer: Self-pay | Admitting: *Deleted

## 2018-06-25 DIAGNOSIS — J430 Unilateral pulmonary emphysema [MacLeod's syndrome]: Secondary | ICD-10-CM

## 2018-06-25 DIAGNOSIS — G629 Polyneuropathy, unspecified: Secondary | ICD-10-CM

## 2018-06-25 DIAGNOSIS — C180 Malignant neoplasm of cecum: Secondary | ICD-10-CM

## 2018-06-25 MED ORDER — OXYCODONE-ACETAMINOPHEN 10-325 MG PO TABS: ORAL_TABLET | ORAL | 0 refills | Status: DC

## 2018-06-26 ENCOUNTER — Other Ambulatory Visit: Payer: Self-pay | Admitting: Hematology & Oncology

## 2018-06-26 DIAGNOSIS — G629 Polyneuropathy, unspecified: Secondary | ICD-10-CM

## 2018-06-26 DIAGNOSIS — J430 Unilateral pulmonary emphysema [MacLeod's syndrome]: Secondary | ICD-10-CM

## 2018-06-26 DIAGNOSIS — C180 Malignant neoplasm of cecum: Secondary | ICD-10-CM

## 2018-06-26 MED FILL — ALPRAZolam 1 MG TABS: 1 | 30 days supply | Qty: 30 | Fill #0

## 2018-06-26 MED FILL — TRINTELLIX 5 MG TABLET: 5 | 30 days supply | Qty: 30 | Fill #5

## 2018-06-27 MED FILL — BREO ELLIPTA 100-25 MCG INH: 100-25 | 30 days supply | Qty: 60 | Fill #2

## 2018-06-29 MED FILL — OXYCODONE-APAP 10-325: 10-325 | 10 days supply | Qty: 120 | Fill #0

## 2018-07-20 MED FILL — GUAIATUSSIN AC LIQUID: 100-10 | 5 days supply | Qty: 200 | Fill #0

## 2018-07-20 MED FILL — predniSONE 10 MG TABS: 10 | 12 days supply | Qty: 48 | Fill #0

## 2018-07-20 MED FILL — VENTOLIN HFA 90 MCG INHALER: 108 (90 BAS | 17 days supply | Qty: 18 | Fill #0

## 2018-07-23 ENCOUNTER — Other Ambulatory Visit: Payer: Self-pay | Admitting: Family

## 2018-07-23 ENCOUNTER — Other Ambulatory Visit: Payer: Self-pay | Admitting: *Deleted

## 2018-07-23 DIAGNOSIS — J430 Unilateral pulmonary emphysema [MacLeod's syndrome]: Secondary | ICD-10-CM

## 2018-07-23 DIAGNOSIS — G629 Polyneuropathy, unspecified: Secondary | ICD-10-CM

## 2018-07-23 DIAGNOSIS — C180 Malignant neoplasm of cecum: Secondary | ICD-10-CM

## 2018-07-23 MED ORDER — GABAPENTIN 600 MG PO TABS
600.0000 mg | ORAL_TABLET | Freq: Four times a day (QID) | ORAL | 3 refills | Status: DC
Start: 2018-07-23 — End: 2019-05-13

## 2018-07-23 MED ORDER — OXYCODONE-ACETAMINOPHEN 10-325 MG PO TABS: ORAL_TABLET | ORAL | 0 refills | Status: DC

## 2018-07-23 MED FILL — TRINTELLIX 5 MG TABLET: 5 | 30 days supply | Qty: 30 | Fill #0

## 2018-07-23 MED FILL — ALPRAZolam 1 MG TABS: 1 | 30 days supply | Qty: 30 | Fill #1

## 2018-07-23 MED FILL — GABAPENTIN 600 MG TABS: 600 | 30 days supply | Qty: 120 | Fill #2

## 2018-07-23 NOTE — Telephone Encounter (Signed)
Refill sent. Pt due for follow up. Sent mychart reminder.

## 2018-07-25 ENCOUNTER — Inpatient Hospital Stay: Payer: 59 | Attending: Hematology & Oncology | Admitting: Hematology & Oncology

## 2018-07-25 ENCOUNTER — Inpatient Hospital Stay: Payer: 59

## 2018-07-25 ENCOUNTER — Encounter: Payer: Self-pay | Admitting: Hematology & Oncology

## 2018-07-25 ENCOUNTER — Other Ambulatory Visit: Payer: Self-pay

## 2018-07-25 VITALS — BP 138/83 | HR 78 | Temp 97.9°F | Resp 16 | Wt 159.4 lb

## 2018-07-25 DIAGNOSIS — G620 Drug-induced polyneuropathy: Secondary | ICD-10-CM | POA: Diagnosis not present

## 2018-07-25 DIAGNOSIS — C180 Malignant neoplasm of cecum: Secondary | ICD-10-CM

## 2018-07-25 DIAGNOSIS — F17210 Nicotine dependence, cigarettes, uncomplicated: Secondary | ICD-10-CM | POA: Diagnosis not present

## 2018-07-25 DIAGNOSIS — Z9221 Personal history of antineoplastic chemotherapy: Secondary | ICD-10-CM | POA: Insufficient documentation

## 2018-07-25 DIAGNOSIS — D500 Iron deficiency anemia secondary to blood loss (chronic): Secondary | ICD-10-CM

## 2018-07-25 DIAGNOSIS — Z79899 Other long term (current) drug therapy: Secondary | ICD-10-CM | POA: Insufficient documentation

## 2018-07-25 DIAGNOSIS — T451X5A Adverse effect of antineoplastic and immunosuppressive drugs, initial encounter: Secondary | ICD-10-CM

## 2018-07-25 DIAGNOSIS — Z85038 Personal history of other malignant neoplasm of large intestine: Secondary | ICD-10-CM | POA: Insufficient documentation

## 2018-07-25 DIAGNOSIS — S93602A Unspecified sprain of left foot, initial encounter: Secondary | ICD-10-CM | POA: Diagnosis not present

## 2018-07-25 LAB — CBC WITH DIFFERENTIAL (CANCER CENTER ONLY)
Basophils Absolute: 0 10*3/uL (ref 0.0–0.1)
Basophils Relative: 0 %
EOS ABS: 0.1 10*3/uL (ref 0.0–0.5)
EOS PCT: 1 %
HEMATOCRIT: 39.9 % (ref 34.8–46.6)
HEMOGLOBIN: 13 g/dL (ref 11.6–15.9)
LYMPHS PCT: 40 %
Lymphs Abs: 4.4 10*3/uL — ABNORMAL HIGH (ref 0.9–3.3)
MCH: 23.2 pg — ABNORMAL LOW (ref 26.0–34.0)
MCHC: 32.6 g/dL (ref 32.0–36.0)
MCV: 71.3 fL — ABNORMAL LOW (ref 81.0–101.0)
MONO ABS: 0.6 10*3/uL (ref 0.1–0.9)
Monocytes Relative: 5 %
Neutro Abs: 5.8 10*3/uL (ref 1.5–6.5)
Neutrophils Relative %: 54 %
Platelet Count: 196 10*3/uL (ref 145–400)
RBC: 5.6 MIL/uL — AB (ref 3.70–5.32)
RDW: 16.1 % — ABNORMAL HIGH (ref 11.1–15.7)
WBC Count: 10.8 10*3/uL — ABNORMAL HIGH (ref 3.9–10.0)

## 2018-07-25 LAB — CMP (CANCER CENTER ONLY)
ALBUMIN: 3.7 g/dL (ref 3.5–5.0)
ALK PHOS: 72 U/L (ref 38–126)
ALT: 19 U/L (ref 0–44)
AST: 17 U/L (ref 15–41)
Anion gap: 10 (ref 5–15)
BILIRUBIN TOTAL: 0.5 mg/dL (ref 0.3–1.2)
BUN: 13 mg/dL (ref 8–23)
CALCIUM: 8.9 mg/dL (ref 8.9–10.3)
CO2: 27 mmol/L (ref 22–32)
Chloride: 106 mmol/L (ref 98–111)
Creatinine: 0.86 mg/dL (ref 0.44–1.00)
GFR, Est AFR Am: >60 mL/min (ref >60)
GFR, Estimated: >60 mL/min (ref >60)
GLUCOSE: 97 mg/dL (ref 70–99)
Potassium: 3.2 mmol/L — ABNORMAL LOW (ref 3.5–5.1)
Sodium: 143 mmol/L (ref 135–145)
Total Protein: 6.6 g/dL (ref 6.5–8.1)

## 2018-07-25 MED FILL — PROMETHAZINE W/DM SYRUP: 6.25-15 | 9 days supply | Qty: 180 | Fill #0

## 2018-07-25 NOTE — Progress Notes (Signed)
Hematology and Oncology Follow Up Visit  CRISTA NUON [id number] May 29, 1956 62 y.o. 07/25/2018   Principle Diagnosis:  1. Stage IIIB (T2 N2 M0) adenocarcinoma of the cecum. 2. Neuropathy secondary to chemotherapy.  Current Therapy:    Observation     Interim History:  Ms.  Assad is back for followup.  We see her every 6 months.  Unfortunately, she now has a boot on her left foot.  She sprained her foot while playing with her grandkids.  This happened about 5 weeks ago.  She has not seen an orthopedist.  She went to an urgent care center.  I am not sure what she is going to do once the boot comes off.  She is now going to retire this year.  She will wait until November.  Her last CEA was stable at 5.1.  She is still smoking.  She smokes about half pack per day.    She has good pain control.  She has bad neuropathy from her chemotherapy.  She is having no problems with bowels or bladder.  Her performance status is ECOG 1  Medications:  Current Outpatient Medications:  .  ALPRAZolam (XANAX) 1 MG tablet, TAKE 1 TABLET (1 MG TOTAL) BY MOUTH AT BEDTIME AS NEEDED FOR ANXIETY OR SLEEP., Disp: 30 tablet, Rfl: 2 .  BREO ELLIPTA 100-25 MCG/INH AEPB, INHALE 1 PUFF INTO THE LUNGS ONCE DAILY, Disp: 60 each, Rfl: 3 .  Calcium-Vitamin D 600-125 MG-UNIT TABS, Take 1 tablet by mouth daily. , Disp: , Rfl:  .  gabapentin (NEURONTIN) 600 MG tablet, Take 1 tablet (600 mg total) by mouth 4 (four) times daily., Disp: 120 tablet, Rfl: 3 .  Multiple Vitamin (MULTIVITAMIN) tablet, Take 1 tablet by mouth daily.  , Disp: , Rfl:  .  nystatin (MYCOSTATIN) 100000 UNIT/ML suspension, Take 5 mLs (500,000 Units total) by mouth 4 (four) times daily., Disp: 200 mL, Rfl: 0 .  oxyCODONE (OXY IR/ROXICODONE) 5 MG immediate release tablet, Take 1 tablet (5 mg total) by mouth every 6 (six) hours as needed for severe pain., Disp: 20 tablet, Rfl: 0 .  oxyCODONE-acetaminophen (PERCOCET) 10-325 MG tablet, 1 - 2 tabs every 4  hours as needed for pain, Disp: 120 tablet, Rfl: 0 .  Pyridoxine HCl (VITAMIN B-6) 250 MG tablet, Take 1 tablet (250 mg total) by mouth daily., Disp: 30 tablet, Rfl: 12 .  TRINTELLIX 5 MG TABS tablet, TAKE 1 TABLET (5 MG TOTAL) BY MOUTH DAILY., Disp: 30 tablet, Rfl: 1 .  TURMERIC PO, Take 1 tablet by mouth daily. , Disp: , Rfl:  .  vitamin E 200 UNIT capsule, Take 200 Units by mouth daily.  , Disp: , Rfl:  .  Zoster Vaccine Adjuvanted [facility]) injection, Inject 0.5mg  IM now and again in 2-6 months., Disp: 0.5 mL, Rfl: 1  Current Facility-Administered Medications:  .  0.9 %  sodium chloride infusion, 500 mL, Intravenous, Once, Milus Banister, MD  Allergies:  Allergies  Allergen Reactions  . Aspirin     Upset stomach  . Cymbalta [Duloxetine Hcl] Swelling    Swollen lips   . Prozac [Fluoxetine Hcl] Swelling    Lip swelling  . Wellbutrin [Bupropion] Swelling    Lip swelling    Past Medical History, Surgical history, Social history, and Family History were reviewed and updated.  Review of Systems: Review of Systems  Constitutional: Negative.   HENT: Negative.   Eyes: Negative.   Respiratory: Negative.   Cardiovascular: Negative.  Gastrointestinal: Positive for nausea.  Genitourinary: Negative.   Musculoskeletal: Negative.   Skin: Negative.   Neurological: Positive for tingling.  Endo/Heme/Allergies: Negative.   Psychiatric/Behavioral: Negative.     Physical Exam:  weight is 159 lb 6.4 oz (72.3 kg). Her oral temperature is 97.9 F (36.6 C). Her blood pressure is 138/83 and her pulse is 78. Her respiration is 16 and oxygen saturation is 97%.   Physical Exam  Constitutional: She is oriented to person, place, and time.  HENT:  Head: Normocephalic and atraumatic.  Mouth/Throat: Oropharynx is clear and moist.  Eyes: Pupils are equal, round, and reactive to light. EOM are normal.  Neck: Normal range of motion.  Cardiovascular: Normal rate, regular rhythm and normal heart  sounds.  Pulmonary/Chest: Effort normal and breath sounds normal.  Abdominal: Soft. Bowel sounds are normal.  Musculoskeletal: Normal range of motion. She exhibits no edema, tenderness or deformity.  Lymphadenopathy:    She has no cervical adenopathy.  Neurological: She is alert and oriented to person, place, and time.  Skin: Skin is warm and dry. No rash noted. No erythema.  Psychiatric: She has a normal mood and affect. Her behavior is normal. Judgment and thought content normal.  Vitals reviewed.    Lab Results  Component Value Date   WBC 10.8 (H) 07/25/2018   HGB 13.0 07/25/2018   HCT 39.9 07/25/2018   MCV 71.3 (L) 07/25/2018   PLT 196 07/25/2018     Chemistry      Component Value Date/Time   NA 138 01/22/2018 1133   NA 141 07/24/2017 1147   NA 142 09/21/2015 1306   K 4.4 01/22/2018 1133   K 3.3 07/24/2017 1147   K 3.8 09/21/2015 1306   CL 103 01/22/2018 1133   CL 103 07/24/2017 1147   CO2 26 01/22/2018 1133   CO2 29 07/24/2017 1147   CO2 27 09/21/2015 1306   BUN 12 01/22/2018 1133   BUN 8 07/24/2017 1147   BUN 9.8 09/21/2015 1306   CREATININE 0.86 01/22/2018 1133   CREATININE 0.9 07/24/2017 1147   CREATININE 0.8 09/21/2015 1306      Component Value Date/Time   CALCIUM 9.8 01/22/2018 1133   CALCIUM 9.0 07/24/2017 1147   CALCIUM 9.2 09/21/2015 1306   ALKPHOS 67 01/22/2018 1133   ALKPHOS 81 07/24/2017 1147   ALKPHOS 77 09/21/2015 1306   AST 14 01/22/2018 1133   AST 18 07/24/2017 1147   AST 15 09/21/2015 1306   ALT 14 01/22/2018 1133   ALT 20 07/24/2017 1147   ALT 13 09/21/2015 1306   BILITOT 0.7 01/22/2018 1133   BILITOT 0.70 07/24/2017 1147   BILITOT 0.40 09/21/2015 1306         Impression and Plan: Ms. Boughner is 62 year old female with stage IIIB colon cancer. This was of the cecum. She underwent resection back in September of 2012. She had 4 positive lymph nodes. She completed chemotherapy in March of 2013.  Thankfully, I don't see any evidence  of colon cancer recurrence.   I am glad that she is going to retire.  She really deserves to retire.  Hopefully, her foot will heal up okay.  I really told her that she needed to see orthopedic surgery to make sure that there is no long-term issues with her foot.  I will see her back in 6 months.  Volanda Napoleon, MD 9/11/201912:50 PM

## 2018-07-26 LAB — CEA (IN HOUSE-CHCC): CEA (CHCC-In House): 4.88 ng/mL (ref 0.00–5.00)

## 2018-07-27 MED FILL — OXYCODONE-APAP 10-325: 10-325 | 10 days supply | Qty: 120 | Fill #0

## 2018-08-20 ENCOUNTER — Other Ambulatory Visit: Payer: Self-pay | Admitting: *Deleted

## 2018-08-20 DIAGNOSIS — G629 Polyneuropathy, unspecified: Secondary | ICD-10-CM

## 2018-08-20 DIAGNOSIS — C180 Malignant neoplasm of cecum: Secondary | ICD-10-CM

## 2018-08-20 DIAGNOSIS — J430 Unilateral pulmonary emphysema [MacLeod's syndrome]: Secondary | ICD-10-CM

## 2018-08-20 MED ORDER — OXYCODONE-ACETAMINOPHEN 10-325 MG PO TABS: ORAL_TABLET | ORAL | 0 refills | Status: DC

## 2018-08-20 MED FILL — OXYCODONE-APAP 10-325: 10-325 | 20 days supply | Qty: 120 | Fill #0

## 2018-08-21 MED FILL — BREO ELLIPTA 100-25 MCG INH: 100-25 | 30 days supply | Qty: 60 | Fill #3

## 2018-08-28 MED FILL — ALPRAZolam 1 MG TABS: 1 | 30 days supply | Qty: 30 | Fill #2

## 2018-09-03 MED FILL — TRINTELLIX 5 MG TABLET: 5 | 30 days supply | Qty: 30 | Fill #1

## 2018-09-03 MED FILL — GABAPENTIN 600 MG TABS: 600 | 30 days supply | Qty: 120 | Fill #3

## 2018-09-17 ENCOUNTER — Other Ambulatory Visit: Payer: Self-pay | Admitting: *Deleted

## 2018-09-17 DIAGNOSIS — J430 Unilateral pulmonary emphysema [MacLeod's syndrome]: Secondary | ICD-10-CM

## 2018-09-17 DIAGNOSIS — G629 Polyneuropathy, unspecified: Secondary | ICD-10-CM

## 2018-09-17 DIAGNOSIS — C180 Malignant neoplasm of cecum: Secondary | ICD-10-CM

## 2018-09-17 MED ORDER — OXYCODONE-ACETAMINOPHEN 10-325 MG PO TABS: ORAL_TABLET | ORAL | 0 refills | Status: DC

## 2018-09-17 MED FILL — OXYCODONE-APAP 10-325: 10-325 | 10 days supply | Qty: 120 | Fill #0

## 2018-09-25 ENCOUNTER — Other Ambulatory Visit: Payer: Self-pay | Admitting: Family

## 2018-09-25 DIAGNOSIS — J430 Unilateral pulmonary emphysema [MacLeod's syndrome]: Secondary | ICD-10-CM

## 2018-09-25 DIAGNOSIS — G629 Polyneuropathy, unspecified: Secondary | ICD-10-CM

## 2018-09-25 DIAGNOSIS — C180 Malignant neoplasm of cecum: Secondary | ICD-10-CM

## 2018-09-25 MED FILL — ALPRAZolam 1 MG TABS: 1 | 30 days supply | Qty: 30 | Fill #0

## 2018-10-02 ENCOUNTER — Other Ambulatory Visit: Payer: Self-pay | Admitting: Family

## 2018-10-02 MED FILL — TRINTELLIX 5 MG TABLET: 5 | 14 days supply | Qty: 14 | Fill #0

## 2018-10-02 NOTE — Telephone Encounter (Signed)
Pt past due for follow up. 2 week supply of Trintellix sent to pharmacy. Please call pt to schedule OV with Melissa soon. Thanks!

## 2018-10-03 NOTE — Telephone Encounter (Signed)
Called pt and LVM informing her that a 2 week supply of her Rx was sent. Also informed that pt was past due for a follow up and would not be able to get any further refills until she is seen. Advised pt to call to schedule an OV at her convenience.

## 2018-10-12 ENCOUNTER — Other Ambulatory Visit: Payer: Self-pay | Admitting: *Deleted

## 2018-10-12 DIAGNOSIS — C180 Malignant neoplasm of cecum: Secondary | ICD-10-CM

## 2018-10-12 DIAGNOSIS — G629 Polyneuropathy, unspecified: Secondary | ICD-10-CM

## 2018-10-12 DIAGNOSIS — J430 Unilateral pulmonary emphysema [MacLeod's syndrome]: Secondary | ICD-10-CM

## 2018-10-12 MED ORDER — OXYCODONE-ACETAMINOPHEN 10-325 MG PO TABS: ORAL_TABLET | ORAL | 0 refills | Status: DC

## 2018-10-12 MED FILL — OXYCODONE-APAP 10-325: 10-325 | 120 days supply | Qty: 120 | Fill #0

## 2018-10-16 MED ORDER — VORTIOXETINE HBR 5 MG PO TABS: ORAL_TABLET | ORAL | 0 refills | Status: DC

## 2018-10-16 NOTE — Telephone Encounter (Signed)
pt calling made an appt for Monday 10-22-18 she states that she will need some medicine TRINTELLIX 5 MG TABS tablet until her appt with [place]  on Monday she has 1 pill left  [facility] ([facility]), [place] [facility] [place] [phone] (Phone) [phone] (Fax)

## 2018-10-16 NOTE — Telephone Encounter (Signed)
30 day supply sent

## 2018-10-22 ENCOUNTER — Ambulatory Visit (INDEPENDENT_AMBULATORY_CARE_PROVIDER_SITE_OTHER): Payer: 59 | Admitting: Family

## 2018-10-22 ENCOUNTER — Other Ambulatory Visit: Payer: Self-pay | Admitting: Pulmonary Disease

## 2018-10-22 ENCOUNTER — Encounter: Payer: Self-pay | Admitting: Family

## 2018-10-22 VITALS — BP 119/74 | HR 93 | Temp 98.4°F | Resp 16 | Ht 68.0 in | Wt 160.0 lb

## 2018-10-22 DIAGNOSIS — Z23 Encounter for immunization: Secondary | ICD-10-CM | POA: Diagnosis not present

## 2018-10-22 DIAGNOSIS — F329 Major depressive disorder, single episode, unspecified: Secondary | ICD-10-CM | POA: Diagnosis not present

## 2018-10-22 DIAGNOSIS — F419 Anxiety disorder, unspecified: Secondary | ICD-10-CM

## 2018-10-22 MED FILL — BREO ELLIPTA 100-25 MCG INH: 100-25 | 30 days supply | Qty: 60 | Fill #0

## 2018-10-22 NOTE — Progress Notes (Signed)
Subjective:    Patient ID: Teresa Morgan, female    DOB: Feb 26, 1956, 62 y.o.   MRN: [id number]  HPI  Patient is a 62 yr old female who presents today for follow up.  Depression/anxiety-reports mood has been good.  Reports no panic attacks since she stopped working a few weeks ago. She just retired. She has been busy with her grandchildren.    Review of Systems    see HPI  Past Medical History:  Diagnosis Date  . Anemia   . Anxiety   . Arthritis   . Cataract    bilat removed  . Colon cancer [facility]) 2013   cecal cancer  . COPD with emphysema ([place]) 06/18/2012  . Depression   . GERD (gastroesophageal reflux disease)   . Neuromuscular disorder ([place])    neuropathy from chemo  . Peptic ulcer   . Pneumonia   . Shortness of breath dyspnea    with exertion     Social History   Socioeconomic History  . Marital status: Married    Spouse name: Not on file  . Number of children: 3  . Years of education: Not on file  . Highest education level: Not on file  Occupational History  . Occupation: Armed forces operational officer: [employer]  Social Needs  . Financial resource strain: Not on file  . Food insecurity:    Worry: Not on file    Inability: Not on file  . Transportation needs:    Medical: Not on file    Non-medical: Not on file  Tobacco Use  . Smoking status: Former Smoker    Packs/day: 1.00    Years: 20.00    Pack years: 20.00    Types: Cigarettes    Start date: 04/23/1986    Last attempt to quit: 12/25/2017    Years since quitting: 0.8  . Smokeless tobacco: Never Used  Substance and Sexual Activity  . Alcohol use: Yes    Alcohol/week: 0.0 standard drinks    Comment: social  . Drug use: No  . Sexual activity: Not on file  Lifestyle  . Physical activity:    Days per week: Not on file    Minutes per session: Not on file  . Stress: Not on file  Relationships  . Social connections:    Talks on phone: Not on file    Gets together: Not on file    Attends religious  service: Not on file    Active member of club or organization: Not on file    Attends meetings of clubs or organizations: Not on file    Relationship status: Not on file  . Intimate partner violence:    Fear of current or ex partner: Not on file    Emotionally abused: Not on file    Physically abused: Not on file    Forced sexual activity: Not on file  Other Topics Concern  . 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 APPENDECTOMY  07/07/11  . COLON RESECTION  2012   for colon cancer  . COLONOSCOPY    . EYE SURGERY Bilateral    cataract surgery with lens implant  . heel spurs Right   . hemrrhoids    . PLANTAR FASCIA SURGERY Right 2009  .  PORT-A-CATH REMOVAL Left 04/19/2016   Procedure: REMOVAL PORT-A-CATH;  Surgeon: [name], MD;  Location: [place];  Service: General;  Laterality: Left;  . portacath    . RADIOACTIVE SEED GUIDED EXCISIONAL BREAST BIOPSY Right 03/15/2018   Procedure: RADIOACTIVE SEED GUIDED EXCISIONAL BREAST BIOPSY;  Surgeon: [name], MD;  Location: [place];  Service: General;  Laterality: Right;  . TUBAL LIGATION  07/1980    Family History  Problem Relation Age of Onset  . Diabetes Mother   . Colon cancer Cousin   . Colon cancer Cousin   . Colon polyps Neg Hx   . Rectal cancer Neg Hx   . Stomach cancer Neg Hx     Allergies  Allergen Reactions  . Aspirin     Upset stomach  . Cymbalta [Duloxetine Hcl] Swelling    Swollen lips   . Prozac [Fluoxetine Hcl] Swelling    Lip swelling  . Wellbutrin [Bupropion] Swelling    Lip swelling    Current Outpatient Medications on File Prior to Visit  Medication Sig Dispense Refill  . ALPRAZolam (XANAX) 1 MG tablet TAKE 1 TABLET (1 MG TOTAL) BY MOUTH AT BEDTIME AS NEEDED FOR ANXIETY OR SLEEP. 30 tablet 2  . BREO ELLIPTA 100-25 MCG/INH AEPB  INHALE 1 PUFF INTO THE LUNGS ONCE DAILY 60 each 3  . Calcium-Vitamin D 600-125 MG-UNIT TABS Take 1 tablet by mouth daily.     Marland Kitchen gabapentin (NEURONTIN) 600 MG tablet Take 1 tablet (600 mg total) by mouth 4 (four) times daily. 120 tablet 3  . Multiple Vitamin (MULTIVITAMIN) tablet Take 1 tablet by mouth daily.      Marland Kitchen nystatin (MYCOSTATIN) 100000 UNIT/ML suspension Take 5 mLs (500,000 Units total) by mouth 4 (four) times daily. 200 mL 0  . oxyCODONE-acetaminophen (PERCOCET) 10-325 MG tablet Take 1 - 2 tablets every 4 hours as needed for pain. 120 tablet 0  . Pyridoxine HCl (VITAMIN B-6) 250 MG tablet Take 1 tablet (250 mg total) by mouth daily. 30 tablet 12  . TURMERIC PO Take 1 tablet by mouth daily.     . vitamin E 200 UNIT capsule Take 200 Units by mouth daily.      Marland Kitchen vortioxetine HBr (TRINTELLIX) 5 MG TABS tablet TAKE 1 TABLET (5 MG TOTAL) BY MOUTH DAILY. 30 tablet 0  . Zoster Vaccine Adjuvanted [facility]) injection Inject 0.5mg  IM now and again in 2-6 months. 0.5 mL 1   Current Facility-Administered Medications on File Prior to Visit  Medication Dose Route Frequency Provider Last Rate Last Dose  . 0.9 %  sodium chloride infusion  500 mL Intravenous Once Milus Banister, MD        BP 119/74 (BP Location: Right Arm, Patient Position: Sitting, Cuff Size: Small)   Pulse 93   Temp 98.4 F (36.9 C) (Oral)   Resp 16   Ht 5\' 8"  (1.727 m)   Wt 160 lb (72.6 kg)   SpO2 96%   BMI 24.33 kg/m    Objective:   Physical Exam  Constitutional: She is oriented to person, place, and time. She appears well-developed and well-nourished. No distress.  Musculoskeletal: She exhibits no edema.  Neurological: She is alert and oriented to person, place, and time.  Skin: Skin is warm and dry.  Psychiatric: She has a normal mood and affect. Her behavior is normal.          Assessment & Plan:  Depression/anxiety- stable/improved now that she is retired. Continue current meds.  Flu shot and shingles  shot today.

## 2018-10-25 MED FILL — ALPRAZolam 1 MG TABS: 1 | 30 days supply | Qty: 30 | Fill #1

## 2018-10-29 ENCOUNTER — Ambulatory Visit: Payer: 59 | Admitting: Family

## 2018-11-05 ENCOUNTER — Other Ambulatory Visit: Payer: Self-pay | Admitting: *Deleted

## 2018-11-05 DIAGNOSIS — C180 Malignant neoplasm of cecum: Secondary | ICD-10-CM

## 2018-11-05 DIAGNOSIS — J430 Unilateral pulmonary emphysema [MacLeod's syndrome]: Secondary | ICD-10-CM

## 2018-11-05 DIAGNOSIS — G629 Polyneuropathy, unspecified: Secondary | ICD-10-CM

## 2018-11-05 MED ORDER — OXYCODONE-ACETAMINOPHEN 10-325 MG PO TABS: ORAL_TABLET | ORAL | 0 refills | Status: DC

## 2018-11-05 MED FILL — OXYCODONE-APAP 10-325: 10-325 | 20 days supply | Qty: 120 | Fill #0

## 2018-11-12 MED FILL — GABAPENTIN 600 MG TABLET: 600 | 30 days supply | Qty: 120 | Fill #0

## 2018-11-20 ENCOUNTER — Other Ambulatory Visit: Payer: Self-pay | Admitting: Family

## 2018-11-20 MED FILL — TRINTELLIX 5 MG TABLET: 5 | 14 days supply | Qty: 14 | Fill #0

## 2018-11-23 MED FILL — ALPRAZolam 1 MG TABS: 1 | 30 days supply | Qty: 30 | Fill #2

## 2018-11-28 MED FILL — BREO ELLIPTA 100-25 MCG INH: 100-25 | 30 days supply | Qty: 60 | Fill #1

## 2018-11-29 ENCOUNTER — Telehealth: Payer: Self-pay | Admitting: Family

## 2018-11-29 NOTE — Telephone Encounter (Signed)
Copied from [place] [id number]. Topic: Quick Communication - See Telephone Encounter >> Nov 29, 2018  2:57 PM Vernona Rieger wrote: CRM for notification. See Telephone encounter for: 11/29/18.  Pt states she will be going out of the [place] in a few days and will be there for two weeks. She said when she got her refill on 1/7 for TRINTELLIX 5 MG TABS tablet it said to make an appt for further refills. Patient is asking to speak with a nurse. She said she was in the office on 12/9 for medication follow up. She has abut 4 pills left.

## 2018-11-30 ENCOUNTER — Other Ambulatory Visit: Payer: Self-pay

## 2018-11-30 ENCOUNTER — Other Ambulatory Visit: Payer: Self-pay | Admitting: *Deleted

## 2018-11-30 DIAGNOSIS — C180 Malignant neoplasm of cecum: Secondary | ICD-10-CM

## 2018-11-30 DIAGNOSIS — J430 Unilateral pulmonary emphysema [MacLeod's syndrome]: Secondary | ICD-10-CM

## 2018-11-30 DIAGNOSIS — G629 Polyneuropathy, unspecified: Secondary | ICD-10-CM

## 2018-11-30 MED ORDER — OXYCODONE-ACETAMINOPHEN 10-325 MG PO TABS: ORAL_TABLET | ORAL | 0 refills | Status: DC

## 2018-11-30 MED ORDER — VORTIOXETINE HBR 5 MG PO TABS: 5.0000 mg | ORAL_TABLET | Freq: Every day | ORAL | 0 refills | Status: DC

## 2018-11-30 MED FILL — TRINTELLIX 5 MG TABLET: 5 | 30 days supply | Qty: 30 | Fill #0

## 2018-11-30 MED FILL — OXYCODONE-APAP 10-325: 10-325 | 10 days supply | Qty: 120 | Fill #0

## 2018-11-30 NOTE — Telephone Encounter (Signed)
rx sent to patient's pharmacy for 30 day supply.

## 2018-12-24 ENCOUNTER — Telehealth: Payer: Self-pay | Admitting: *Deleted

## 2018-12-24 ENCOUNTER — Other Ambulatory Visit: Payer: Self-pay | Admitting: Family

## 2018-12-24 ENCOUNTER — Other Ambulatory Visit: Payer: Self-pay | Admitting: Hematology & Oncology

## 2018-12-24 DIAGNOSIS — G629 Polyneuropathy, unspecified: Secondary | ICD-10-CM

## 2018-12-24 DIAGNOSIS — J430 Unilateral pulmonary emphysema [MacLeod's syndrome]: Secondary | ICD-10-CM

## 2018-12-24 DIAGNOSIS — C180 Malignant neoplasm of cecum: Secondary | ICD-10-CM

## 2018-12-24 MED ORDER — OXYCODONE-ACETAMINOPHEN 10-325 MG PO TABS: ORAL_TABLET | ORAL | 0 refills | Status: DC

## 2018-12-24 MED FILL — GABAPENTIN 600 MG TABS: 600 | 30 days supply | Qty: 120 | Fill #1

## 2018-12-24 NOTE — Telephone Encounter (Signed)
Message left from patient requesting a refill for Xanax and Oxycodone to be sent to [facility].  Prescriptions sent.

## 2018-12-25 MED FILL — ALPRAZolam 1 MG TABS: 1 | 30 days supply | Qty: 30 | Fill #0

## 2018-12-25 MED FILL — OXYCODONE-APAP 10-325: 10-325 | 10 days supply | Qty: 120 | Fill #0

## 2019-01-03 ENCOUNTER — Other Ambulatory Visit: Payer: Self-pay | Admitting: Family

## 2019-01-04 MED FILL — TRINTELLIX 5 MG TABLET: 5 | 30 days supply | Qty: 30 | Fill #0

## 2019-01-11 ENCOUNTER — Other Ambulatory Visit: Payer: Self-pay | Admitting: *Deleted

## 2019-01-17 ENCOUNTER — Other Ambulatory Visit: Payer: Self-pay | Admitting: Hematology & Oncology

## 2019-01-17 DIAGNOSIS — G629 Polyneuropathy, unspecified: Secondary | ICD-10-CM

## 2019-01-17 DIAGNOSIS — C180 Malignant neoplasm of cecum: Secondary | ICD-10-CM

## 2019-01-17 DIAGNOSIS — J430 Unilateral pulmonary emphysema [MacLeod's syndrome]: Secondary | ICD-10-CM

## 2019-01-17 MED FILL — OXYCODONE-APAP 10-325: 10-325 | 10 days supply | Qty: 120 | Fill #0

## 2019-01-18 MED FILL — BREO ELLIPTA 100-25 MCG INH: 100-25 | 30 days supply | Qty: 60 | Fill #2

## 2019-01-23 ENCOUNTER — Inpatient Hospital Stay: Payer: 59

## 2019-01-23 ENCOUNTER — Encounter: Payer: Self-pay | Admitting: Hematology & Oncology

## 2019-01-23 ENCOUNTER — Inpatient Hospital Stay: Payer: 59 | Attending: Hematology & Oncology | Admitting: Hematology & Oncology

## 2019-01-23 ENCOUNTER — Other Ambulatory Visit: Payer: Self-pay

## 2019-01-23 VITALS — BP 119/71 | HR 84 | Temp 98.3°F | Resp 20 | Wt 161.0 lb

## 2019-01-23 DIAGNOSIS — C180 Malignant neoplasm of cecum: Secondary | ICD-10-CM

## 2019-01-23 DIAGNOSIS — Z79899 Other long term (current) drug therapy: Secondary | ICD-10-CM | POA: Diagnosis not present

## 2019-01-23 DIAGNOSIS — G620 Drug-induced polyneuropathy: Secondary | ICD-10-CM | POA: Diagnosis not present

## 2019-01-23 DIAGNOSIS — Z85038 Personal history of other malignant neoplasm of large intestine: Secondary | ICD-10-CM | POA: Diagnosis present

## 2019-01-23 DIAGNOSIS — D500 Iron deficiency anemia secondary to blood loss (chronic): Secondary | ICD-10-CM

## 2019-01-23 DIAGNOSIS — Z9221 Personal history of antineoplastic chemotherapy: Secondary | ICD-10-CM | POA: Insufficient documentation

## 2019-01-23 DIAGNOSIS — F17210 Nicotine dependence, cigarettes, uncomplicated: Secondary | ICD-10-CM | POA: Diagnosis not present

## 2019-01-23 LAB — CBC WITH DIFFERENTIAL (CANCER CENTER ONLY)
Abs Immature Granulocytes: 0.02 10*3/uL (ref 0.00–0.07)
Basophils Absolute: 0 10*3/uL (ref 0.0–0.1)
Basophils Relative: 0 %
EOS PCT: 2 %
Eosinophils Absolute: 0.2 10*3/uL (ref 0.0–0.5)
HCT: 44.5 % (ref 36.0–46.0)
HEMOGLOBIN: 13.9 g/dL (ref 12.0–15.0)
Immature Granulocytes: 0 %
LYMPHS ABS: 2 10*3/uL (ref 0.7–4.0)
LYMPHS PCT: 25 %
MCH: 23.2 pg — AB (ref 26.0–34.0)
MCHC: 31.2 g/dL (ref 30.0–36.0)
MCV: 74.2 fL — ABNORMAL LOW (ref 80.0–100.0)
MONO ABS: 0.3 10*3/uL (ref 0.1–1.0)
Monocytes Relative: 4 %
Neutro Abs: 5.6 10*3/uL (ref 1.7–7.7)
Neutrophils Relative %: 69 %
Platelet Count: 167 10*3/uL (ref 150–400)
RBC: 6 MIL/uL — ABNORMAL HIGH (ref 3.87–5.11)
RDW: 15.8 % — AB (ref 11.5–15.5)
WBC: 8.1 10*3/uL (ref 4.0–10.5)
nRBC: 0 % (ref 0.0–0.2)

## 2019-01-23 LAB — CMP (CANCER CENTER ONLY)
ALK PHOS: 81 U/L (ref 38–126)
ALT: 11 U/L (ref 0–44)
ANION GAP: 8 (ref 5–15)
AST: 14 U/L — ABNORMAL LOW (ref 15–41)
Albumin: 4.6 g/dL (ref 3.5–5.0)
BUN: 14 mg/dL (ref 8–23)
CALCIUM: 9.3 mg/dL (ref 8.9–10.3)
CO2: 27 mmol/L (ref 22–32)
Chloride: 104 mmol/L (ref 98–111)
Creatinine: 0.93 mg/dL (ref 0.44–1.00)
Glucose, Bld: 87 mg/dL (ref 70–99)
Potassium: 3.6 mmol/L (ref 3.5–5.1)
Sodium: 139 mmol/L (ref 135–145)
TOTAL PROTEIN: 7.1 g/dL (ref 6.5–8.1)
Total Bilirubin: 0.4 mg/dL (ref 0.3–1.2)

## 2019-01-23 LAB — LACTATE DEHYDROGENASE: LDH: 227 U/L — AB (ref 98–192)

## 2019-01-23 MED FILL — ALPRAZolam 1 MG TABS: 1 | 30 days supply | Qty: 30 | Fill #1

## 2019-01-23 NOTE — Progress Notes (Signed)
Hematology and Oncology Follow Up Visit  Teresa Morgan [id number] 06/09/1956 63 y.o. 01/23/2019   Principle Diagnosis:  1. Stage IIIB (T2 N2 M0) adenocarcinoma of the cecum. 2. Neuropathy secondary to chemotherapy.  Current Therapy:    Observation     Interim History:  Ms.  Morgan is back for followup.  We see her every 6 months.  She is doing okay.  She does not have any problems with her left foot.  The last time we saw her, she had a boot on the left foot.  Her big problem is that she is still smoking.  She is having a lot of stress at home.  She is now retired but still she is having issues.  She feels that smoking helps to relax her.  She has not had a chest x-ray in quite a while from what I can tell.  We may have to think about getting a chest x-ray on her.  Her last mammogram was back in April 2019.  She had an abnormality in the right breast.  She ultimately underwent a biopsy.SZ  This was done on 03/15/2018.  The pathology report ([id number]) showed usual ductal hyperplasia.  There is no evidence of malignancy.  She has had no issues with her bowels or bladder.  Her last CEA level was 4.88 back in September 2019.    Her performance status is ECOG 1  Medications:  Current Outpatient Medications:  .  ALPRAZolam (XANAX) 1 MG tablet, TAKE 1 TABLET (1 MG TOTAL) BY MOUTH AT BEDTIME AS NEEDED FOR ANXIETY OR SLEEP., Disp: 30 tablet, Rfl: 2 .  Calcium-Vitamin D 600-125 MG-UNIT TABS, Take 1 tablet by mouth daily. , Disp: , Rfl:  .  fluticasone furoate-vilanterol (BREO ELLIPTA) 100-25 MCG/INH AEPB, Inhale 1 puff into the lungs daily., Disp: , Rfl:  .  gabapentin (NEURONTIN) 600 MG tablet, Take 1 tablet (600 mg total) by mouth 4 (four) times daily., Disp: 120 tablet, Rfl: 3 .  Multiple Vitamin (MULTIVITAMIN) tablet, Take 1 tablet by mouth daily.  , Disp: , Rfl:  .  oxyCODONE-acetaminophen (PERCOCET) 10-325 MG tablet, TAKE 1 TO 2 TABLETS BY MOUTH BY MOUTH EVERY 4 HOURS AS NEEDED FOR  PAIN, Disp: 120 tablet, Rfl: 0 .  Pyridoxine HCl (VITAMIN B-6) 250 MG tablet, Take 1 tablet (250 mg total) by mouth daily., Disp: 30 tablet, Rfl: 12 .  TRINTELLIX 5 MG TABS tablet, TAKE 1 TABLET (5 MG TOTAL) BY MOUTH DAILY., Disp: 30 tablet, Rfl: 0 .  TURMERIC PO, Take 1 tablet by mouth daily. , Disp: , Rfl:  .  vitamin E 200 UNIT capsule, Take 200 Units by mouth daily.  , Disp: , Rfl:  .  BREO ELLIPTA 100-25 MCG/INH AEPB, INHALE 1 PUFF INTO THE LUNGS ONCE DAILY, Disp: 60 each, Rfl: 3 .  nystatin (MYCOSTATIN) 100000 UNIT/ML suspension, Take 5 mLs (500,000 Units total) by mouth 4 (four) times daily., Disp: 200 mL, Rfl: 0 .  Zoster Vaccine Adjuvanted [facility]) injection, Inject 0.5mg  IM now and again in 2-6 months. (Patient not taking: Reported on 01/23/2019), Disp: 0.5 mL, Rfl: 1  Current Facility-Administered Medications:  .  0.9 %  sodium chloride infusion, 500 mL, Intravenous, Once, Milus Banister, MD  Allergies:  Allergies  Allergen Reactions  . Aspirin     Upset stomach  . Cymbalta [Duloxetine Hcl] Swelling    Swollen lips   . Prozac [Fluoxetine Hcl] Swelling    Lip swelling  . Wellbutrin [Bupropion] Swelling  Lip swelling    Past Medical History, Surgical history, Social history, and Family History were reviewed and updated.  Review of Systems: Review of Systems  Constitutional: Negative.   HENT: Negative.   Eyes: Negative.   Respiratory: Negative.   Cardiovascular: Negative.   Gastrointestinal: Positive for nausea.  Genitourinary: Negative.   Musculoskeletal: Negative.   Skin: Negative.   Neurological: Positive for tingling.  Endo/Heme/Allergies: Negative.   Psychiatric/Behavioral: Negative.     Physical Exam:  weight is 161 lb (73 kg). Her oral temperature is 98.3 F (36.8 C). Her blood pressure is 119/71 and her pulse is 84. Her respiration is 20 and oxygen saturation is 98%.   Physical Exam Vitals signs reviewed.  HENT:     Head: Normocephalic and  atraumatic.  Eyes:     Pupils: Pupils are equal, round, and reactive to light.  Neck:     Musculoskeletal: Normal range of motion.  Cardiovascular:     Rate and Rhythm: Normal rate and regular rhythm.     Heart sounds: Normal heart sounds.  Pulmonary:     Effort: Pulmonary effort is normal.     Breath sounds: Normal breath sounds.  Abdominal:     General: Bowel sounds are normal.     Palpations: Abdomen is soft.  Musculoskeletal: Normal range of motion.        General: No tenderness or deformity.  Lymphadenopathy:     Cervical: No cervical adenopathy.  Skin:    General: Skin is warm and dry.     Findings: No erythema or rash.  Neurological:     Mental Status: She is alert and oriented to person, place, and time.  Psychiatric:        Behavior: Behavior normal.        Thought Content: Thought content normal.        Judgment: Judgment normal.      Lab Results  Component Value Date   WBC 8.1 01/23/2019   HGB 13.9 01/23/2019   HCT 44.5 01/23/2019   MCV 74.2 (L) 01/23/2019   PLT 167 01/23/2019     Chemistry      Component Value Date/Time   NA 139 01/23/2019 1113   NA 141 07/24/2017 1147   NA 142 09/21/2015 1306   K 3.6 01/23/2019 1113   K 3.3 07/24/2017 1147   K 3.8 09/21/2015 1306   CL 104 01/23/2019 1113   CL 103 07/24/2017 1147   CO2 27 01/23/2019 1113   CO2 29 07/24/2017 1147   CO2 27 09/21/2015 1306   BUN 14 01/23/2019 1113   BUN 8 07/24/2017 1147   BUN 9.8 09/21/2015 1306   CREATININE 0.93 01/23/2019 1113   CREATININE 0.9 07/24/2017 1147   CREATININE 0.8 09/21/2015 1306      Component Value Date/Time   CALCIUM 9.3 01/23/2019 1113   CALCIUM 9.0 07/24/2017 1147   CALCIUM 9.2 09/21/2015 1306   ALKPHOS 81 01/23/2019 1113   ALKPHOS 81 07/24/2017 1147   ALKPHOS 77 09/21/2015 1306   AST 14 (L) 01/23/2019 1113   AST 15 09/21/2015 1306   ALT 11 01/23/2019 1113   ALT 20 07/24/2017 1147   ALT 13 09/21/2015 1306   BILITOT 0.4 01/23/2019 1113   BILITOT 0.40  09/21/2015 1306         Impression and Plan: Teresa Morgan is 47 okay what is -year-old female with stage IIIB colon cancer. This was of the cecum. She underwent resection back in September of 2012. She  had 4 positive lymph nodes. She completed chemotherapy in March of 2013.  I doubt that colon cancer recurrence will ever be a problem for her.  With her smoking, one has to worry about the possibility of lung cancer.  We will see her back in another 6 months.  I think this is reasonable for follow-up.    Volanda Napoleon, MD 3/11/202012:43 PM

## 2019-01-24 LAB — CEA (IN HOUSE-CHCC): CEA (CHCC-IN HOUSE): 4.89 ng/mL (ref 0.00–5.00)

## 2019-02-06 ENCOUNTER — Other Ambulatory Visit: Payer: Self-pay | Admitting: Family

## 2019-02-07 MED FILL — TRINTELLIX 5 MG TABLET: 5 | 30 days supply | Qty: 30 | Fill #0

## 2019-02-08 ENCOUNTER — Other Ambulatory Visit: Payer: Self-pay | Admitting: Hematology & Oncology

## 2019-02-08 DIAGNOSIS — J430 Unilateral pulmonary emphysema [MacLeod's syndrome]: Secondary | ICD-10-CM

## 2019-02-08 DIAGNOSIS — C180 Malignant neoplasm of cecum: Secondary | ICD-10-CM

## 2019-02-08 DIAGNOSIS — G629 Polyneuropathy, unspecified: Secondary | ICD-10-CM

## 2019-02-08 MED FILL — OXYCODONE-APAP 10-325: 10-325 | 10 days supply | Qty: 120 | Fill #0

## 2019-02-08 MED FILL — GABAPENTIN 600 MG TAB: 600 | 30 days supply | Qty: 120 | Fill #2

## 2019-02-20 ENCOUNTER — Encounter: Payer: 59 | Admitting: Family

## 2019-02-25 MED FILL — BREO ELLIPTA 100-25 MCG INH: 100-25 | 30 days supply | Qty: 60 | Fill #3

## 2019-02-25 MED FILL — ALPRAZolam 1 MG TABS: 1 | 30 days supply | Qty: 30 | Fill #2

## 2019-03-04 ENCOUNTER — Other Ambulatory Visit: Payer: Self-pay | Admitting: Hematology & Oncology

## 2019-03-04 DIAGNOSIS — J430 Unilateral pulmonary emphysema [MacLeod's syndrome]: Secondary | ICD-10-CM

## 2019-03-04 DIAGNOSIS — G629 Polyneuropathy, unspecified: Secondary | ICD-10-CM

## 2019-03-04 DIAGNOSIS — C180 Malignant neoplasm of cecum: Secondary | ICD-10-CM

## 2019-03-05 ENCOUNTER — Other Ambulatory Visit: Payer: Self-pay | Admitting: *Deleted

## 2019-03-05 DIAGNOSIS — C180 Malignant neoplasm of cecum: Secondary | ICD-10-CM

## 2019-03-05 DIAGNOSIS — J430 Unilateral pulmonary emphysema [MacLeod's syndrome]: Secondary | ICD-10-CM

## 2019-03-05 DIAGNOSIS — G629 Polyneuropathy, unspecified: Secondary | ICD-10-CM

## 2019-03-06 MED FILL — TRINTELLIX 5 MG TABLET: 5 | 30 days supply | Qty: 30 | Fill #1

## 2019-03-08 ENCOUNTER — Other Ambulatory Visit: Payer: Self-pay | Admitting: *Deleted

## 2019-03-08 DIAGNOSIS — G629 Polyneuropathy, unspecified: Secondary | ICD-10-CM

## 2019-03-08 DIAGNOSIS — C180 Malignant neoplasm of cecum: Secondary | ICD-10-CM

## 2019-03-08 DIAGNOSIS — J430 Unilateral pulmonary emphysema [MacLeod's syndrome]: Secondary | ICD-10-CM

## 2019-03-08 MED ORDER — OXYCODONE-ACETAMINOPHEN 10-325 MG PO TABS: ORAL_TABLET | ORAL | 0 refills | Status: DC

## 2019-03-08 MED FILL — OXYCODONE-APAP 10-325: 10-325 | 10 days supply | Qty: 120 | Fill #0

## 2019-03-20 ENCOUNTER — Other Ambulatory Visit: Payer: Self-pay | Admitting: Hematology & Oncology

## 2019-03-20 DIAGNOSIS — C180 Malignant neoplasm of cecum: Secondary | ICD-10-CM

## 2019-03-20 DIAGNOSIS — J430 Unilateral pulmonary emphysema [MacLeod's syndrome]: Secondary | ICD-10-CM

## 2019-03-20 DIAGNOSIS — G629 Polyneuropathy, unspecified: Secondary | ICD-10-CM

## 2019-03-26 MED FILL — ALPRAZolam 1 MG TABS: 1 | 30 days supply | Qty: 30 | Fill #0

## 2019-04-02 ENCOUNTER — Other Ambulatory Visit: Payer: Self-pay | Admitting: *Deleted

## 2019-04-05 ENCOUNTER — Other Ambulatory Visit: Payer: Self-pay | Admitting: *Deleted

## 2019-04-05 DIAGNOSIS — J430 Unilateral pulmonary emphysema [MacLeod's syndrome]: Secondary | ICD-10-CM

## 2019-04-05 DIAGNOSIS — G629 Polyneuropathy, unspecified: Secondary | ICD-10-CM

## 2019-04-05 DIAGNOSIS — C180 Malignant neoplasm of cecum: Secondary | ICD-10-CM

## 2019-04-05 MED ORDER — OXYCODONE-ACETAMINOPHEN 10-325 MG PO TABS: ORAL_TABLET | ORAL | 0 refills | Status: DC

## 2019-04-05 MED FILL — OXYCODONE-APAP 10-325: 10-325 | 10 days supply | Qty: 120 | Fill #0

## 2019-04-10 ENCOUNTER — Other Ambulatory Visit: Payer: Self-pay | Admitting: Pulmonary Disease

## 2019-04-10 MED FILL — TRINTELLIX 5 MG TABLET: 5 | 30 days supply | Qty: 30 | Fill #2

## 2019-04-10 MED FILL — BREO ELLIPTA 100-25 MCG INH: 100-25 | 30 days supply | Qty: 60 | Fill #0

## 2019-04-10 MED FILL — GABAPENTIN 600 MG TABS: 600 | 30 days supply | Qty: 120 | Fill #3

## 2019-04-20 IMAGING — MG BREAST SURGICAL SPECIMEN
1 series · 1 of 1 positions shown · non-contrast
Comparison: Previous exam(s).

CLINICAL DATA: Excisional biopsy for high risk complex sclerosing
lesion involving the UPPER INNER QUADRANT of the RIGHT breast.
Radioactive seed localization was performed yesterday.

EXAM:
SPECIMEN RADIOGRAPH OF THE RIGHT BREAST

[R]
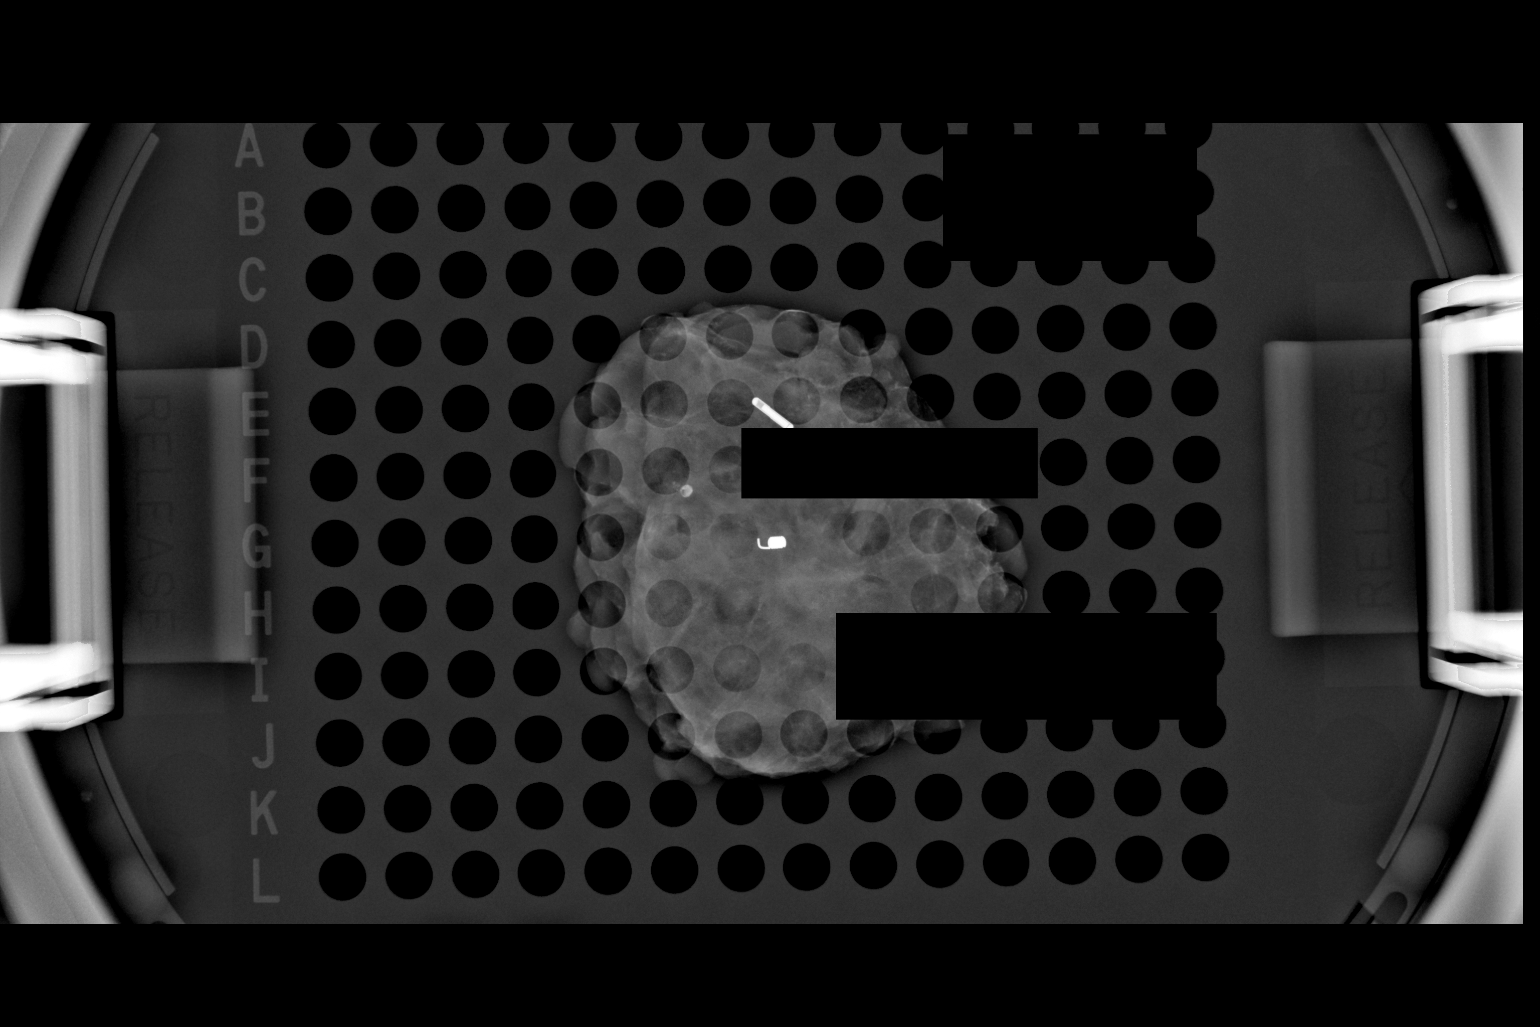

[1 of 1 positions shown; findings below may reference images not displayed]

FINDINGS: Status post excision of the right breast. The radioactive seed and
coil shaped biopsy marker clip are present, completely intact, and
are marked for pathology. This was discussed with the operating room
nurse at the time of interpretation on 03/15/2018 at [DATE] p.m..
IMPRESSION: Specimen radiograph of the right breast.

## 2019-04-24 MED FILL — ALPRAZolam 1 MG TABS: 1 | 30 days supply | Qty: 30 | Fill #1

## 2019-04-30 ENCOUNTER — Other Ambulatory Visit: Payer: Self-pay | Admitting: *Deleted

## 2019-04-30 DIAGNOSIS — J430 Unilateral pulmonary emphysema [MacLeod's syndrome]: Secondary | ICD-10-CM

## 2019-04-30 DIAGNOSIS — G629 Polyneuropathy, unspecified: Secondary | ICD-10-CM

## 2019-04-30 DIAGNOSIS — C180 Malignant neoplasm of cecum: Secondary | ICD-10-CM

## 2019-04-30 MED ORDER — OXYCODONE-ACETAMINOPHEN 10-325 MG PO TABS: ORAL_TABLET | ORAL | 0 refills | Status: DC

## 2019-04-30 MED FILL — OXYCODONE-APAP 10-325: 10-325 | 10 days supply | Qty: 120 | Fill #0

## 2019-05-07 ENCOUNTER — Other Ambulatory Visit: Payer: Self-pay

## 2019-05-07 ENCOUNTER — Encounter: Payer: Self-pay | Admitting: Family

## 2019-05-07 ENCOUNTER — Ambulatory Visit (INDEPENDENT_AMBULATORY_CARE_PROVIDER_SITE_OTHER): Payer: 59 | Admitting: Family

## 2019-05-07 ENCOUNTER — Telehealth: Payer: Self-pay

## 2019-05-07 VITALS — BP 130/73 | HR 83 | Temp 98.4°F | Resp 16 | Ht 68.0 in | Wt 154.0 lb

## 2019-05-07 DIAGNOSIS — M264 Malocclusion, unspecified: Secondary | ICD-10-CM

## 2019-05-07 DIAGNOSIS — Z23 Encounter for immunization: Secondary | ICD-10-CM | POA: Diagnosis not present

## 2019-05-07 DIAGNOSIS — Z0000 Encounter for general adult medical examination without abnormal findings: Secondary | ICD-10-CM

## 2019-05-07 LAB — HEPATIC FUNCTION PANEL
ALT: 11 U/L (ref 0–35)
AST: 15 U/L (ref 0–37)
Albumin: 4.3 g/dL (ref 3.5–5.2)
Alkaline Phosphatase: 76 U/L (ref 39–117)
Bilirubin, Direct: 0.1 mg/dL (ref 0.0–0.3)
Total Bilirubin: 0.4 mg/dL (ref 0.2–1.2)
Total Protein: 6.6 g/dL (ref 6.0–8.3)

## 2019-05-07 LAB — BASIC METABOLIC PANEL
BUN: 9 mg/dL (ref 6–23)
CO2: 28 mEq/L (ref 19–32)
Calcium: 8.9 mg/dL (ref 8.4–10.5)
Chloride: 103 mEq/L (ref 96–112)
Creatinine, Ser: 0.73 mg/dL (ref 0.40–1.20)
GFR: 80.22 mL/min (ref >60.00)
Glucose, Bld: 69 mg/dL — ABNORMAL LOW (ref 70–99)
Potassium: 3.8 mEq/L (ref 3.5–5.1)
Sodium: 139 mEq/L (ref 135–145)

## 2019-05-07 LAB — LIPID PANEL
Cholesterol: 208 mg/dL — ABNORMAL HIGH (ref 0–200)
HDL: 42.4 mg/dL (ref >39.00)
LDL Cholesterol: 133 mg/dL — ABNORMAL HIGH (ref 0–99)
NonHDL: 165.12
Total CHOL/HDL Ratio: 5
Triglycerides: 159 mg/dL — ABNORMAL HIGH (ref 0.0–149.0)
VLDL: 31.8 mg/dL (ref 0.0–40.0)

## 2019-05-07 LAB — CBC WITH DIFFERENTIAL/PLATELET
Basophils Absolute: 0 10*3/uL (ref 0.0–0.1)
Basophils Relative: 0.4 % (ref 0.0–3.0)
Eosinophils Absolute: 0.2 10*3/uL (ref 0.0–0.7)
Eosinophils Relative: 2.4 % (ref 0.0–5.0)
HCT: 44 % (ref 36.0–46.0)
Hemoglobin: 14 g/dL (ref 12.0–15.0)
Lymphocytes Relative: 27.1 % (ref 12.0–46.0)
Lymphs Abs: 1.9 10*3/uL (ref 0.7–4.0)
MCHC: 31.7 g/dL (ref 30.0–36.0)
MCV: 74.3 fl — ABNORMAL LOW (ref 78.0–100.0)
Monocytes Absolute: 0.3 10*3/uL (ref 0.1–1.0)
Monocytes Relative: 4.9 % (ref 3.0–12.0)
Neutro Abs: 4.5 10*3/uL (ref 1.4–7.7)
Neutrophils Relative %: 65.2 % (ref 43.0–77.0)
Platelets: 165 10*3/uL (ref 150.0–400.0)
RBC: 5.92 Mil/uL — ABNORMAL HIGH (ref 3.87–5.11)
RDW: 15.2 % (ref 11.5–15.5)
WBC: 6.8 10*3/uL (ref 4.0–10.5)

## 2019-05-07 LAB — TSH: TSH: 1 u[IU]/mL (ref 0.35–4.50)

## 2019-05-07 NOTE — Telephone Encounter (Signed)
Copied from [place] [phone]. Topic: General - Inquiry >> May 07, 2019  2:54 PM Mathis Bud wrote: Reason for CRM: Baldo Ash called from [facility], Patient did have referral from PCP but Baldo Ash stated that patient needs to see an orthodontist before seen by Primus Bravo, [place] call back # [phone]

## 2019-05-07 NOTE — Progress Notes (Signed)
Subjective:    Patient ID: Teresa Morgan, female    DOB: 16-Mar-1956, 63 y.o.   MRN: [id number]  HPI  Patient presents today for complete physical.  Immunizations: tdap 2014, due for shingrix #2 Diet:reports diet is healthy Exercise: lots of yard work Colonoscopy: 6/19  Dexa: 2018 Pap Smear: 2018 Mammogram: 4/19 Dental: due Reports that she has a crooked jaw and needs to see a surgeon to discuss surgical repair.  She believes that this is causing her headaches. Vision: due for follow up  Reports that she started smoking 1 PPD.  She plans to quit cold [place].  She complains of insomnia.  She reports that she drinks tea throughout the day and evening.   Past Medical History:  Diagnosis Date  . Anemia   . Anxiety   . Arthritis   . Cataract    bilat removed  . Colon cancer [facility]) 2013   cecal cancer  . COPD with emphysema ([place]) 06/18/2012  . Depression   . GERD (gastroesophageal reflux disease)   . Neuromuscular disorder ([place])    neuropathy from chemo  . Peptic ulcer   . Pneumonia   . Shortness of breath dyspnea    with exertion     Social History   Socioeconomic History  . Marital status: Married    Spouse name: Not on file  . Number of children: 3  . Years of education: Not on file  . Highest education level: Not on file  Occupational History  . Occupation: Armed forces operational officer: [employer]  Social Needs  . Financial resource strain: Not on file  . Food insecurity    Worry: Not on file    Inability: Not on file  . Transportation needs    Medical: Not on file    Non-medical: Not on file  Tobacco Use  . Smoking status: Current Every Day Smoker    Packs/day: 1.00    Years: 20.00    Pack years: 20.00    Types: Cigarettes    Start date: 04/23/1986    Last attempt to quit: 12/25/2017    Years since quitting: 1.3  . Smokeless tobacco: Never Used  Substance and Sexual Activity  . Alcohol use: Yes    Alcohol/week: 0.0 standard drinks    Comment: social  .  Drug use: No  . Sexual activity: Not on file  Lifestyle  . Physical activity    Days per week: Not on file    Minutes per session: Not on file  . Stress: Not on file  Relationships  . Social Herbalist on phone: Not on file    Gets together: Not on file    Attends religious service: Not on file    Active member of club or organization: Not on file    Attends meetings of clubs or organizations: Not on file    Relationship status: Not on file  . Intimate partner violence    Fear of current or ex partner: Not on file    Emotionally abused: Not on file    Physically abused: Not on file    Forced sexual activity: Not on file  Other Topics Concern  . 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 APPENDECTOMY  07/07/11  .  COLON RESECTION  2012   for colon cancer  . COLONOSCOPY    . EYE SURGERY Bilateral    cataract surgery with lens implant  . heel spurs Right   . hemrrhoids    . PLANTAR FASCIA SURGERY Right 2009  . PORT-A-CATH REMOVAL Left 04/19/2016   Procedure: REMOVAL PORT-A-CATH;  Surgeon: [name], MD;  Location: [place];  Service: General;  Laterality: Left;  . portacath    . RADIOACTIVE SEED GUIDED EXCISIONAL BREAST BIOPSY Right 03/15/2018   Procedure: RADIOACTIVE SEED GUIDED EXCISIONAL BREAST BIOPSY;  Surgeon: [name], MD;  Location: [place];  Service: General;  Laterality: Right;  . TUBAL LIGATION  07/1980    Family History  Problem Relation Age of Onset  . Diabetes Mother   . Colon cancer Cousin   . Colon cancer Cousin   . Thalassemia Daughter   . Colon polyps Neg Hx   . Rectal cancer Neg Hx   . Stomach cancer Neg Hx     Allergies  Allergen Reactions  . Aspirin     Upset stomach  . Cymbalta [Duloxetine Hcl] Swelling    Swollen lips   . Prozac [Fluoxetine Hcl] Swelling     Lip swelling  . Wellbutrin [Bupropion] Swelling    Lip swelling    Current Outpatient Medications on File Prior to Visit  Medication Sig Dispense Refill  . ALPRAZolam (XANAX) 1 MG tablet TAKE 1 TABLET BY MOUTH AT BEDTIME AS NEEDED FOR ANIXETY OR SLEEP 30 tablet 2  . BREO ELLIPTA 100-25 MCG/INH AEPB INHALE 1 PUFF INTO THE LUNGS ONCE DAILY 60 each 1  . Calcium-Vitamin D 600-125 MG-UNIT TABS Take 1 tablet by mouth daily.     . fluticasone furoate-vilanterol (BREO ELLIPTA) 100-25 MCG/INH AEPB Inhale 1 puff into the lungs daily.    Marland Kitchen gabapentin (NEURONTIN) 600 MG tablet Take 1 tablet (600 mg total) by mouth 4 (four) times daily. 120 tablet 3  . Multiple Vitamin (MULTIVITAMIN) tablet Take 1 tablet by mouth daily.      Marland Kitchen nystatin (MYCOSTATIN) 100000 UNIT/ML suspension Take 5 mLs (500,000 Units total) by mouth 4 (four) times daily. 200 mL 0  . oxyCODONE-acetaminophen (PERCOCET) 10-325 MG tablet TAKE 1 TO 2 TABLETS BY MOUTH EVERY 4 HOURS AS NEEDED FOR PAIN 120 tablet 0  . Pyridoxine HCl (VITAMIN B-6) 250 MG tablet Take 1 tablet (250 mg total) by mouth daily. 30 tablet 12  . TRINTELLIX 5 MG TABS tablet TAKE 1 TABLET (5 MG TOTAL) BY MOUTH DAILY. 30 tablet 2  . TURMERIC PO Take 1 tablet by mouth daily.     . vitamin E 200 UNIT capsule Take 200 Units by mouth daily.      Marland Kitchen Zoster Vaccine Adjuvanted [facility]) injection Inject 0.5mg  IM now and again in 2-6 months. 0.5 mL 1   Current Facility-Administered Medications on File Prior to Visit  Medication Dose Route Frequency Provider Last Rate Last Dose  . 0.9 %  sodium chloride infusion  500 mL Intravenous Once Milus Banister, MD        BP 130/73 (BP Location: Right Arm, Patient Position: Sitting, Cuff Size: Small)   Pulse 83   Temp 98.4 F (36.9 C) (Oral)   Resp 16   Ht 5\' 8"  (1.727 m)   Wt 154 lb (69.9 kg)   SpO2 98%   BMI 23.42 kg/m          Assessment & Plan:      Review of  Systems  Constitutional: Negative for unexpected weight  change.  HENT: Negative for hearing loss and rhinorrhea.   Eyes: Negative for visual disturbance.  Respiratory: Negative for cough.   Cardiovascular: Negative for leg swelling.  Gastrointestinal: Negative for constipation and diarrhea.  Genitourinary: Negative for dysuria, frequency and hematuria.  Musculoskeletal: Negative for arthralgias and myalgias.  Skin: Negative for rash.  Neurological: Positive for headaches (attributes this to malocclusion).  Hematological: Negative for adenopathy.  Psychiatric/Behavioral:       Denies depression/anxiety   Past Medical History:  Diagnosis Date  . Anemia   . Anxiety   . Arthritis   . Cataract    bilat removed  . Colon cancer [facility]) 2013   cecal cancer  . COPD with emphysema ([place]) 06/18/2012  . Depression   . GERD (gastroesophageal reflux disease)   . Neuromuscular disorder ([place])    neuropathy from chemo  . Peptic ulcer   . Pneumonia   . Shortness of breath dyspnea    with exertion     Social History   Socioeconomic History  . Marital status: Married    Spouse name: Not on file  . Number of children: 3  . Years of education: Not on file  . Highest education level: Not on file  Occupational History  . Occupation: Armed forces operational officer: [employer]  Social Needs  . Financial resource strain: Not on file  . Food insecurity    Worry: Not on file    Inability: Not on file  . Transportation needs    Medical: Not on file    Non-medical: Not on file  Tobacco Use  . Smoking status: Current Every Day Smoker    Packs/day: 1.00    Years: 20.00    Pack years: 20.00    Types: Cigarettes    Start date: 04/23/1986    Last attempt to quit: 12/25/2017    Years since quitting: 1.3  . Smokeless tobacco: Never Used  Substance and Sexual Activity  . Alcohol use: Yes    Alcohol/week: 0.0 standard drinks    Comment: social  . Drug use: No  . Sexual activity: Not on file  Lifestyle  . Physical activity    Days per week: Not on file     Minutes per session: Not on file  . Stress: Not on file  Relationships  . Social Herbalist on phone: Not on file    Gets together: Not on file    Attends religious service: Not on file    Active member of club or organization: Not on file    Attends meetings of clubs or organizations: Not on file    Relationship status: Not on file  . Intimate partner violence    Fear of current or ex partner: Not on file    Emotionally abused: Not on file    Physically abused: Not on file    Forced sexual activity: Not on file  Other Topics Concern  . 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 APPENDECTOMY  07/07/11  . COLON RESECTION  2012   for colon cancer  . COLONOSCOPY    . EYE SURGERY Bilateral    cataract surgery with lens implant  . heel spurs Right   .  hemrrhoids    . PLANTAR FASCIA SURGERY Right 2009  . PORT-A-CATH REMOVAL Left 04/19/2016   Procedure: REMOVAL PORT-A-CATH;  Surgeon: [name], MD;  Location: [place];  Service: General;  Laterality: Left;  . portacath    . RADIOACTIVE SEED GUIDED EXCISIONAL BREAST BIOPSY Right 03/15/2018   Procedure: RADIOACTIVE SEED GUIDED EXCISIONAL BREAST BIOPSY;  Surgeon: [name], MD;  Location: [place];  Service: General;  Laterality: Right;  . TUBAL LIGATION  07/1980    Family History  Problem Relation Age of Onset  . Diabetes Mother   . Colon cancer Cousin   . Colon cancer Cousin   . Thalassemia Daughter   . Colon polyps Neg Hx   . Rectal cancer Neg Hx   . Stomach cancer Neg Hx     Allergies  Allergen Reactions  . Aspirin     Upset stomach  . Cymbalta [Duloxetine Hcl] Swelling    Swollen lips   . Prozac [Fluoxetine Hcl] Swelling    Lip swelling  . Wellbutrin [Bupropion] Swelling    Lip swelling    Current Outpatient Medications on File Prior  to Visit  Medication Sig Dispense Refill  . ALPRAZolam (XANAX) 1 MG tablet TAKE 1 TABLET BY MOUTH AT BEDTIME AS NEEDED FOR ANIXETY OR SLEEP 30 tablet 2  . BREO ELLIPTA 100-25 MCG/INH AEPB INHALE 1 PUFF INTO THE LUNGS ONCE DAILY 60 each 1  . Calcium-Vitamin D 600-125 MG-UNIT TABS Take 1 tablet by mouth daily.     . fluticasone furoate-vilanterol (BREO ELLIPTA) 100-25 MCG/INH AEPB Inhale 1 puff into the lungs daily.    Marland Kitchen gabapentin (NEURONTIN) 600 MG tablet Take 1 tablet (600 mg total) by mouth 4 (four) times daily. 120 tablet 3  . Multiple Vitamin (MULTIVITAMIN) tablet Take 1 tablet by mouth daily.      Marland Kitchen nystatin (MYCOSTATIN) 100000 UNIT/ML suspension Take 5 mLs (500,000 Units total) by mouth 4 (four) times daily. 200 mL 0  . oxyCODONE-acetaminophen (PERCOCET) 10-325 MG tablet TAKE 1 TO 2 TABLETS BY MOUTH EVERY 4 HOURS AS NEEDED FOR PAIN 120 tablet 0  . Pyridoxine HCl (VITAMIN B-6) 250 MG tablet Take 1 tablet (250 mg total) by mouth daily. 30 tablet 12  . TRINTELLIX 5 MG TABS tablet TAKE 1 TABLET (5 MG TOTAL) BY MOUTH DAILY. 30 tablet 2  . TURMERIC PO Take 1 tablet by mouth daily.     . vitamin E 200 UNIT capsule Take 200 Units by mouth daily.      Marland Kitchen Zoster Vaccine Adjuvanted [facility]) injection Inject 0.5mg  IM now and again in 2-6 months. 0.5 mL 1   Current Facility-Administered Medications on File Prior to Visit  Medication Dose Route Frequency Provider Last Rate Last Dose  . 0.9 %  sodium chloride infusion  500 mL Intravenous Once Milus Banister, MD        BP 130/73 (BP Location: Right Arm, Patient Position: Sitting, Cuff Size: Small)   Pulse 83   Temp 98.4 F (36.9 C) (Oral)   Resp 16   Ht 5\' 8"  (1.727 m)   Wt 154 lb (69.9 kg)   SpO2 98%   BMI 23.42 kg/m        Objective:   Physical Exam  Physical Exam  Constitutional: She is oriented to person, place, and time. She appears well-developed and well-nourished. No distress.  HENT:  Head: Normocephalic and atraumatic.   Right Ear: Tympanic membrane and ear canal normal.  Left Ear: Tympanic membrane  and ear canal normal.  Mouth/Throat: uneven bite noted Eyes: Pupils are equal, round, and reactive to light. No scleral icterus.  Neck: Normal range of motion. No thyromegaly present.  Cardiovascular: Normal rate and regular rhythm.   No murmur heard. Pulmonary/Chest: Effort normal and breath sounds normal. No respiratory distress. He has no wheezes. She has no rales. She exhibits no tenderness.  Abdominal: Soft. Bowel sounds are normal. She exhibits no distension and no mass. There is no tenderness. There is no rebound and no guarding.  Musculoskeletal: She exhibits no edema.  Lymphadenopathy:    She has no cervical adenopathy.  Neurological: She is alert and oriented to person, place, and time. She has normal patellar reflexes. She exhibits normal muscle tone. Coordination normal.  Skin: Skin is warm and dry.  Psychiatric: She has a normal mood and affect. Her behavior is normal. Judgment and thought content normal.  Breast/pelvic: deferred     Assessment & Plan:   Preventative care- shingrix today.  Continue healthy diet and regular exercise.  We discussed the importance of quitting smoking.  She states she is motivated to quit.  Colonoscopy is up-to-date, tetanus is up-to-date, she is due for mammogram and order  been placed.  Malocclusion- will refer to maxillofacial surgeon for further evaluation     Assessment & Plan:

## 2019-05-07 NOTE — Patient Instructions (Signed)
Please work on quitting smoking. Set a bedtime around 11 oclock. Avoid caffeine after 2 PM in the afternoon.

## 2019-05-08 ENCOUNTER — Encounter: Payer: Self-pay | Admitting: Family

## 2019-05-08 NOTE — Telephone Encounter (Signed)
Noted, see mychart message to patient.

## 2019-05-13 ENCOUNTER — Other Ambulatory Visit: Payer: Self-pay | Admitting: Family

## 2019-05-13 ENCOUNTER — Other Ambulatory Visit: Payer: Self-pay | Admitting: Hematology & Oncology

## 2019-05-13 DIAGNOSIS — C180 Malignant neoplasm of cecum: Secondary | ICD-10-CM

## 2019-05-13 DIAGNOSIS — G629 Polyneuropathy, unspecified: Secondary | ICD-10-CM

## 2019-05-13 DIAGNOSIS — J430 Unilateral pulmonary emphysema [MacLeod's syndrome]: Secondary | ICD-10-CM

## 2019-05-13 MED FILL — TRINTELLIX 5 MG TABLET: 5 | 30 days supply | Qty: 30 | Fill #0

## 2019-05-13 MED FILL — GABAPENTIN 600 MG TAB: 600 | 30 days supply | Qty: 120 | Fill #0

## 2019-05-16 ENCOUNTER — Other Ambulatory Visit: Payer: Self-pay

## 2019-05-16 ENCOUNTER — Ambulatory Visit (HOSPITAL_BASED_OUTPATIENT_CLINIC_OR_DEPARTMENT_OTHER)
Admission: RE | Admit: 2019-05-16 | Discharge: 2019-05-16 | Disposition: A | Discharge status: Home / Self Care | Payer: 59 | Source: Ambulatory Visit | Attending: Family | Admitting: Family

## 2019-05-16 ENCOUNTER — Encounter (HOSPITAL_BASED_OUTPATIENT_CLINIC_OR_DEPARTMENT_OTHER): Payer: Self-pay

## 2019-05-16 DIAGNOSIS — Z1231 Encounter for screening mammogram for malignant neoplasm of breast: Secondary | ICD-10-CM | POA: Insufficient documentation

## 2019-05-16 DIAGNOSIS — Z0000 Encounter for general adult medical examination without abnormal findings: Secondary | ICD-10-CM

## 2019-05-24 MED FILL — ALPRAZolam 1 MG TABS: 1 | 30 days supply | Qty: 30 | Fill #2

## 2019-05-27 ENCOUNTER — Other Ambulatory Visit: Payer: Self-pay | Admitting: *Deleted

## 2019-05-27 DIAGNOSIS — G629 Polyneuropathy, unspecified: Secondary | ICD-10-CM

## 2019-05-27 DIAGNOSIS — C180 Malignant neoplasm of cecum: Secondary | ICD-10-CM

## 2019-05-27 DIAGNOSIS — J430 Unilateral pulmonary emphysema [MacLeod's syndrome]: Secondary | ICD-10-CM

## 2019-05-27 MED ORDER — OXYCODONE-ACETAMINOPHEN 10-325 MG PO TABS: ORAL_TABLET | ORAL | 0 refills | Status: DC

## 2019-05-27 MED FILL — OXYCODONE-APAP 10-325: 10-325 | 10 days supply | Qty: 120 | Fill #0

## 2019-06-13 MED FILL — BREO ELLIPTA 100-25 MCG INH: 100-25 | 30 days supply | Qty: 60 | Fill #1

## 2019-06-13 MED FILL — TRINTELLIX 5 MG TABLET: 5 | 30 days supply | Qty: 30 | Fill #1

## 2019-06-19 ENCOUNTER — Other Ambulatory Visit: Payer: Self-pay | Admitting: Family

## 2019-06-19 ENCOUNTER — Other Ambulatory Visit: Payer: Self-pay | Admitting: Hematology & Oncology

## 2019-06-19 DIAGNOSIS — G629 Polyneuropathy, unspecified: Secondary | ICD-10-CM

## 2019-06-19 DIAGNOSIS — J430 Unilateral pulmonary emphysema [MacLeod's syndrome]: Secondary | ICD-10-CM

## 2019-06-19 DIAGNOSIS — C180 Malignant neoplasm of cecum: Secondary | ICD-10-CM

## 2019-06-20 MED FILL — OXYCODONE-APAP 10-325: 10-325 | 10 days supply | Qty: 120 | Fill #0

## 2019-06-20 MED FILL — ALPRAZolam 1 MG TABS: 1 | 30 days supply | Qty: 30 | Fill #0

## 2019-07-11 ENCOUNTER — Other Ambulatory Visit: Payer: Self-pay | Admitting: Family

## 2019-07-11 DIAGNOSIS — G629 Polyneuropathy, unspecified: Secondary | ICD-10-CM

## 2019-07-11 DIAGNOSIS — J430 Unilateral pulmonary emphysema [MacLeod's syndrome]: Secondary | ICD-10-CM

## 2019-07-11 DIAGNOSIS — C180 Malignant neoplasm of cecum: Secondary | ICD-10-CM

## 2019-07-11 MED FILL — OXYCODONE-APAP 10-325: 10-325 | 10 days supply | Qty: 120 | Fill #0

## 2019-07-15 MED FILL — TRINTELLIX 5 MG TABLET: 5 | 30 days supply | Qty: 30 | Fill #2

## 2019-07-15 MED FILL — GABAPENTIN 600 MG TABS: 600 | 30 days supply | Qty: 120 | Fill #1

## 2019-07-23 ENCOUNTER — Other Ambulatory Visit: Payer: Self-pay | Admitting: Pulmonary Disease

## 2019-07-23 MED FILL — ALPRAZolam 1 MG TABS: 1 | 30 days supply | Qty: 30 | Fill #1

## 2019-07-25 ENCOUNTER — Telehealth: Payer: Self-pay | Admitting: Primary Care

## 2019-07-25 NOTE — Telephone Encounter (Signed)
Called and spoke with Patient. Patient is scheduled to see Beth,NP at 1:45pm, 07/26/19. Patient stated she has had diarrhea since eating at her in laws on Sunday.  Patient stated she has had 1 loose BM today.   Patient denied any fever,chills, or other covid symptoms.  Patient stated she has my chart, but does not remember her password. Patient OV changed to Tele Visit at 1:45pm for follow up with Beth,NP.  Nothing further at this time.

## 2019-07-26 ENCOUNTER — Inpatient Hospital Stay: Payer: 59 | Attending: Hematology & Oncology | Admitting: Hematology & Oncology

## 2019-07-26 ENCOUNTER — Encounter: Payer: Self-pay | Admitting: Primary Care

## 2019-07-26 ENCOUNTER — Encounter: Payer: Self-pay | Admitting: Hematology & Oncology

## 2019-07-26 ENCOUNTER — Ambulatory Visit (INDEPENDENT_AMBULATORY_CARE_PROVIDER_SITE_OTHER): Payer: 59 | Admitting: Primary Care

## 2019-07-26 ENCOUNTER — Telehealth: Payer: Self-pay | Admitting: Hematology & Oncology

## 2019-07-26 ENCOUNTER — Other Ambulatory Visit: Payer: Self-pay | Admitting: Pulmonary Disease

## 2019-07-26 ENCOUNTER — Inpatient Hospital Stay: Payer: 59

## 2019-07-26 ENCOUNTER — Telehealth: Payer: Self-pay | Admitting: Primary Care

## 2019-07-26 ENCOUNTER — Other Ambulatory Visit: Payer: Self-pay

## 2019-07-26 VITALS — BP 129/76 | HR 92 | Temp 97.7°F | Resp 18 | Wt 153.0 lb

## 2019-07-26 DIAGNOSIS — Z79899 Other long term (current) drug therapy: Secondary | ICD-10-CM | POA: Diagnosis not present

## 2019-07-26 DIAGNOSIS — C180 Malignant neoplasm of cecum: Secondary | ICD-10-CM | POA: Diagnosis not present

## 2019-07-26 DIAGNOSIS — R911 Solitary pulmonary nodule: Secondary | ICD-10-CM | POA: Diagnosis not present

## 2019-07-26 DIAGNOSIS — J449 Chronic obstructive pulmonary disease, unspecified: Secondary | ICD-10-CM | POA: Diagnosis not present

## 2019-07-26 DIAGNOSIS — F17210 Nicotine dependence, cigarettes, uncomplicated: Secondary | ICD-10-CM | POA: Diagnosis not present

## 2019-07-26 DIAGNOSIS — Z85038 Personal history of other malignant neoplasm of large intestine: Secondary | ICD-10-CM | POA: Diagnosis not present

## 2019-07-26 DIAGNOSIS — Z720 Tobacco use: Secondary | ICD-10-CM | POA: Diagnosis not present

## 2019-07-26 DIAGNOSIS — G620 Drug-induced polyneuropathy: Secondary | ICD-10-CM | POA: Diagnosis not present

## 2019-07-26 DIAGNOSIS — Z9221 Personal history of antineoplastic chemotherapy: Secondary | ICD-10-CM | POA: Insufficient documentation

## 2019-07-26 DIAGNOSIS — T451X5A Adverse effect of antineoplastic and immunosuppressive drugs, initial encounter: Secondary | ICD-10-CM | POA: Diagnosis not present

## 2019-07-26 LAB — CBC WITH DIFFERENTIAL (CANCER CENTER ONLY)
Abs Immature Granulocytes: 0.04 10*3/uL (ref 0.00–0.07)
Basophils Absolute: 0 10*3/uL (ref 0.0–0.1)
Basophils Relative: 0 %
Eosinophils Absolute: 0.2 10*3/uL (ref 0.0–0.5)
Eosinophils Relative: 2 %
HCT: 45.7 % (ref 36.0–46.0)
Hemoglobin: 14.5 g/dL (ref 12.0–15.0)
Immature Granulocytes: 0 %
Lymphocytes Relative: 23 %
Lymphs Abs: 2.3 10*3/uL (ref 0.7–4.0)
MCH: 23.4 pg — ABNORMAL LOW (ref 26.0–34.0)
MCHC: 31.7 g/dL (ref 30.0–36.0)
MCV: 73.7 fL — ABNORMAL LOW (ref 80.0–100.0)
Monocytes Absolute: 0.5 10*3/uL (ref 0.1–1.0)
Monocytes Relative: 5 %
Neutro Abs: 7 10*3/uL (ref 1.7–7.7)
Neutrophils Relative %: 70 %
Platelet Count: 182 10*3/uL (ref 150–400)
RBC: 6.2 MIL/uL — ABNORMAL HIGH (ref 3.87–5.11)
RDW: 17.4 % — ABNORMAL HIGH (ref 11.5–15.5)
WBC Count: 10 10*3/uL (ref 4.0–10.5)
nRBC: 0 % (ref 0.0–0.2)

## 2019-07-26 LAB — CMP (CANCER CENTER ONLY)
ALT: 15 U/L (ref 0–44)
AST: 16 U/L (ref 15–41)
Albumin: 4.5 g/dL (ref 3.5–5.0)
Alkaline Phosphatase: 87 U/L (ref 38–126)
Anion gap: 7 (ref 5–15)
BUN: 10 mg/dL (ref 8–23)
CO2: 29 mmol/L (ref 22–32)
Calcium: 9.4 mg/dL (ref 8.9–10.3)
Chloride: 104 mmol/L (ref 98–111)
Creatinine: 0.86 mg/dL (ref 0.44–1.00)
GFR, Est AFR Am: >60 mL/min (ref >60)
GFR, Estimated: >60 mL/min (ref >60)
Glucose, Bld: 84 mg/dL (ref 70–99)
Potassium: 3.8 mmol/L (ref 3.5–5.1)
Sodium: 140 mmol/L (ref 135–145)
Total Bilirubin: 0.5 mg/dL (ref 0.3–1.2)
Total Protein: 7.2 g/dL (ref 6.5–8.1)

## 2019-07-26 MED ORDER — ALBUTEROL SULFATE HFA 108 (90 BASE) MCG/ACT IN AERS: 2.0000 | INHALATION_SPRAY | Freq: Four times a day (QID) | RESPIRATORY_TRACT | 1 refills | Status: DC | PRN

## 2019-07-26 MED ORDER — BREO ELLIPTA 100-25 MCG/INH IN AEPB: INHALATION_SPRAY | RESPIRATORY_TRACT | 1 refills | Status: DC

## 2019-07-26 MED FILL — BREO ELLIPTA 100-25 MCG INH: 100-25 | 30 days supply | Qty: 60 | Fill #0

## 2019-07-26 MED FILL — PROAIR HFA 90 MCG INHALER: 108 (90 BAS | 25 days supply | Qty: 9 | Fill #0

## 2019-07-26 NOTE — Progress Notes (Signed)
Hematology and Oncology Follow Up Visit  Teresa Morgan [id number] [birth year] 63 y.o. 07/26/2019   Principle Diagnosis:  1. Stage IIIB (T2 N2 M0) adenocarcinoma of the cecum. 2. Neuropathy secondary to chemotherapy.  Current Therapy:    Observation     Interim History:  Ms.  Morgan is back for followup.  She is doing quite well.  She has had no problems since we last saw her.  She is retired.  She retired from the post office.  She is happy that she has retired because it will be crazy at the post office with the upcoming election and balance being mailed out.  She has had no change with respect to her smoking.  She is having a hard time quitting.  Of note, her last CT of the chest was back in 2017.  It may not be a bad idea to recheck a CT scan of her chest given her tobacco use.    Her last CEA level was holding steady.  It was 4.89.  She has had no issues with her bowels or bladder.  Her last mammogram was on 05/16/2019.  Everything looked fine with no evidence of suspicious calcifications.    Her performance status is ECOG 1  Medications:  Current Outpatient Medications:  .  ALPRAZolam (XANAX) 1 MG tablet, TAKE 1 TABLET BY MOUTH AT BEDTIME AS NEEDED FOR ANXIETY OR SLEEP, Disp: 30 tablet, Rfl: 2 .  BREO ELLIPTA 100-25 MCG/INH AEPB, INHALE 1 PUFF INTO THE LUNGS ONCE DAILY, Disp: 60 each, Rfl: 1 .  Calcium-Vitamin D 600-125 MG-UNIT TABS, Take 1 tablet by mouth daily. , Disp: , Rfl:  .  fluticasone furoate-vilanterol (BREO ELLIPTA) 100-25 MCG/INH AEPB, Inhale 1 puff into the lungs daily., Disp: , Rfl:  .  gabapentin (NEURONTIN) 600 MG tablet, TAKE 1 TABLET (600 MG TOTAL) BY MOUTH 4 (FOUR) TIMES DAILY., Disp: 120 tablet, Rfl: 3 .  Multiple Vitamin (MULTIVITAMIN) tablet, Take 1 tablet by mouth daily.  , Disp: , Rfl:  .  nystatin (MYCOSTATIN) 100000 UNIT/ML suspension, Take 5 mLs (500,000 Units total) by mouth 4 (four) times daily., Disp: 200 mL, Rfl: 0 .  oxyCODONE-acetaminophen  (PERCOCET) 10-325 MG tablet, TAKE 1-2 TABLETS BY MOUTH EVERY 4 HOURS AS NEEDED FOR PAIN, Disp: 120 tablet, Rfl: 0 .  Pyridoxine HCl (VITAMIN B-6) 250 MG tablet, Take 1 tablet (250 mg total) by mouth daily., Disp: 30 tablet, Rfl: 12 .  TRINTELLIX 5 MG TABS tablet, TAKE 1 TABLET (5 MG TOTAL) BY MOUTH DAILY., Disp: 30 tablet, Rfl: 2 .  TURMERIC PO, Take 1 tablet by mouth daily. , Disp: , Rfl:  .  vitamin E 200 UNIT capsule, Take 200 Units by mouth daily.  , Disp: , Rfl:  .  Zoster Vaccine Adjuvanted [facility]) injection, Inject 0.5mg  IM now and again in 2-6 months., Disp: 0.5 mL, Rfl: 1  Current Facility-Administered Medications:  .  0.9 %  sodium chloride infusion, 500 mL, Intravenous, Once, Milus Banister, MD  Allergies:  Allergies  Allergen Reactions  . Aspirin     Upset stomach  . Cymbalta [Duloxetine Hcl] Swelling    Swollen lips   . Prozac [Fluoxetine Hcl] Swelling    Lip swelling  . Wellbutrin [Bupropion] Swelling    Lip swelling    Past Medical History, Surgical history, Social history, and Family History were reviewed and updated.  Review of Systems: Review of Systems  Constitutional: Negative.   HENT: Negative.   Eyes: Negative.  Respiratory: Negative.   Cardiovascular: Negative.   Gastrointestinal: Positive for nausea.  Genitourinary: Negative.   Musculoskeletal: Negative.   Skin: Negative.   Neurological: Positive for tingling.  Endo/Heme/Allergies: Negative.   Psychiatric/Behavioral: Negative.     Physical Exam:  weight is 153 lb (69.4 kg). Her temporal temperature is 97.7 F (36.5 C). Her blood pressure is 129/76 and her pulse is 92. Her respiration is 18 and oxygen saturation is 100%.   Physical Exam Vitals signs reviewed.  HENT:     Head: Normocephalic and atraumatic.  Eyes:     Pupils: Pupils are equal, round, and reactive to light.  Neck:     Musculoskeletal: Normal range of motion.  Cardiovascular:     Rate and Rhythm: Normal rate and regular  rhythm.     Heart sounds: Normal heart sounds.  Pulmonary:     Effort: Pulmonary effort is normal.     Breath sounds: Normal breath sounds.  Abdominal:     General: Bowel sounds are normal.     Palpations: Abdomen is soft.  Musculoskeletal: Normal range of motion.        General: No tenderness or deformity.  Lymphadenopathy:     Cervical: No cervical adenopathy.  Skin:    General: Skin is warm and dry.     Findings: No erythema or rash.  Neurological:     Mental Status: She is alert and oriented to person, place, and time.  Psychiatric:        Behavior: Behavior normal.        Thought Content: Thought content normal.        Judgment: Judgment normal.      Lab Results  Component Value Date   WBC 10.0 07/26/2019   HGB 14.5 07/26/2019   HCT 45.7 07/26/2019   MCV 73.7 (L) 07/26/2019   PLT 182 07/26/2019     Chemistry      Component Value Date/Time   NA 140 07/26/2019 1117   NA 141 07/24/2017 1147   NA 142 09/21/2015 1306   K 3.8 07/26/2019 1117   K 3.3 07/24/2017 1147   K 3.8 09/21/2015 1306   CL 104 07/26/2019 1117   CL 103 07/24/2017 1147   CO2 29 07/26/2019 1117   CO2 29 07/24/2017 1147   CO2 27 09/21/2015 1306   BUN 10 07/26/2019 1117   BUN 8 07/24/2017 1147   BUN 9.8 09/21/2015 1306   CREATININE 0.86 07/26/2019 1117   CREATININE 0.9 07/24/2017 1147   CREATININE 0.8 09/21/2015 1306      Component Value Date/Time   CALCIUM 9.4 07/26/2019 1117   CALCIUM 9.0 07/24/2017 1147   CALCIUM 9.2 09/21/2015 1306   ALKPHOS 87 07/26/2019 1117   ALKPHOS 81 07/24/2017 1147   ALKPHOS 77 09/21/2015 1306   AST 16 07/26/2019 1117   AST 15 09/21/2015 1306   ALT 15 07/26/2019 1117   ALT 20 07/24/2017 1147   ALT 13 09/21/2015 1306   BILITOT 0.5 07/26/2019 1117   BILITOT 0.40 09/21/2015 1306         Impression and Plan: Teresa Morgan is 64 year-old female with stage IIIB colon cancer. This was of the cecum. She underwent resection back in September of 2012. She had 4  positive lymph nodes. She completed chemotherapy in March of 2013.  I doubt that colon cancer recurrence will ever be a problem for her.  With her smoking, one has to worry about the possibility of lung cancer.  We will  now get her back yearly.  I think this would be reasonable.  Again, I will see about a CT of the chest.  Maybe we can also get a low-dose CT scan and see how this helps with respect to early detection of lung cancer.    Volanda Napoleon, MD 9/11/202012:27 PM

## 2019-07-26 NOTE — Telephone Encounter (Signed)
lmom to inform pat of one year follow up in Sept 2021 per 9/11 LOS

## 2019-07-26 NOTE — Progress Notes (Signed)
Virtual Visit via Telephone Note  I connected with Teresa Morgan on 07/26/19 at  1:45 PM EDT by telephone and verified that I am speaking with the correct person using two identifiers.  Location: Patient: Home Provider: Office   I discussed the limitations, risks, security and privacy concerns of performing an evaluation and management service by telephone and the availability of in person appointments. I also discussed with the patient that there may be a patient responsible charge related to this service. The patient expressed understanding and agreed to proceed.   History of Present Illness: 63 year old female, current smoker (30-37+ pack year hx).  Past medical history significant for COPD Gold B, emphysema, allergic rhinitis, lung nodule, cecum adenocarcinoma stage III B (s/p resection and chemo 2012-2013), diverticulosis, iron deficiency anemia, snoring and tobacco abuse.  Patient of Dr. Elsworth Soho last seen February 2019.  07/26/2019  Patient contacted today for tele-visit.  She is doing well with no acute complaints.  She had diarrhea for 4 days which has now resolved. Breathing has been stable.  No recent COPD exacerbations requiring antibiotics or oral steroids. Still has some shortness of breath with weather change.  She does not have prescription for albuterol, feels she could benefit from this. Reports increased mucus production at night when lying down, clear in color.  She takes her Breo at night, needs refills.  Continues to smoke 1 pack/day. She has quit in the past for 7 years and then unfortunately started smoking again.  Not currently using nicotine patches or Chantix.  CT chest 2017 showed stable right lung nodules. Denies fever, wheezing, purulent mucus production.    Significant testing: Spirometry-FEV1 77%, ratio 69 (mild airway obstruction)  Imaging: CT chest in 2015 with tiny nodules .  CT chest in 01/2016 with stable right lung nodules , additional nodules improved or  resolved.   Observations/Objective:  - No shortness of breath, wheezing or cough  Assessment and Plan:  COPD GOLD II - Stable interval, no recent exacerbations - Eos absolute 200 - Continue Breo 100 two puffs twice daily  - RX Albuterol hfa 2 puffs every 6 hours as needed for sob/wheezing  - Patient to get influenza vaccine today at [facility]   Diarrhea - Resolved, BRAT diet - Felt to be related to something she ate   Tobacco abuse - Active 1ppd smoker; 30-37 pack year hx  - Refer to lung cancer screening clinic - Smoking cessation reviewed at length (taper amount, pick quit date)  Follow Up Instructions:  3 months in office with Dr. Elsworth Soho or NP   I discussed the assessment and treatment plan with the patient. The patient was provided an opportunity to ask questions and all were answered. The patient agreed with the plan and demonstrated an understanding of the instructions.   The patient was advised to call back or seek an in-person evaluation if the symptoms worsen or if the condition fails to improve as anticipated.  I provided 22 minutes of non-face-to-face time during this encounter.   Martyn Ehrich, NP

## 2019-07-26 NOTE — Patient Instructions (Signed)
Recommendations: Continue Breo 1 puff daily-try taking in the morning  RX Albuterol hfa- take 2 puffs every 6 hours as needed for breakthrough shortness of breath/wheezing  Take Mucinex twice daily as needed for increased mucus production/cough  Continue working on quitting smoking- TRY tapering amount and pick a quit date!   Recommend that you get the flu shot this year  Referral: Lung cancer screening clinic   Follow-up: 3 months with Dr. Elsworth Soho or NP

## 2019-07-26 NOTE — Telephone Encounter (Signed)
Spoke with pt and advised to discuss her Breo refill at telephone visit today with Derl Barrow. NP to make sure she wants her  To stay on the same medication. Nothing further needed at this time.

## 2019-07-29 LAB — CEA (IN HOUSE-CHCC): CEA (CHCC-In House): 7.06 ng/mL — ABNORMAL HIGH (ref 0.00–5.00)

## 2019-08-01 ENCOUNTER — Other Ambulatory Visit: Payer: Self-pay | Admitting: Family

## 2019-08-01 DIAGNOSIS — C180 Malignant neoplasm of cecum: Secondary | ICD-10-CM

## 2019-08-01 DIAGNOSIS — G629 Polyneuropathy, unspecified: Secondary | ICD-10-CM

## 2019-08-01 DIAGNOSIS — J430 Unilateral pulmonary emphysema [MacLeod's syndrome]: Secondary | ICD-10-CM

## 2019-08-01 MED FILL — OXYCODONE-APAP 10-325: 10-325 | 10 days supply | Qty: 120 | Fill #0

## 2019-08-15 ENCOUNTER — Other Ambulatory Visit: Payer: Self-pay | Admitting: Family

## 2019-08-15 ENCOUNTER — Ambulatory Visit (HOSPITAL_BASED_OUTPATIENT_CLINIC_OR_DEPARTMENT_OTHER)
Admission: RE | Admit: 2019-08-15 | Discharge: 2019-08-15 | Disposition: A | Discharge status: Home / Self Care | Payer: 59 | Source: Ambulatory Visit | Attending: Hematology & Oncology | Admitting: Hematology & Oncology

## 2019-08-15 ENCOUNTER — Other Ambulatory Visit (HOSPITAL_BASED_OUTPATIENT_CLINIC_OR_DEPARTMENT_OTHER): Payer: Self-pay | Admitting: Hematology & Oncology

## 2019-08-15 ENCOUNTER — Ambulatory Visit (HOSPITAL_BASED_OUTPATIENT_CLINIC_OR_DEPARTMENT_OTHER): Admission: RE | Admit: 2019-08-15 | Payer: 59 | Source: Ambulatory Visit

## 2019-08-15 ENCOUNTER — Telehealth: Payer: Self-pay | Admitting: *Deleted

## 2019-08-15 ENCOUNTER — Other Ambulatory Visit: Payer: Self-pay

## 2019-08-15 ENCOUNTER — Other Ambulatory Visit: Payer: Self-pay | Admitting: Hematology & Oncology

## 2019-08-15 ENCOUNTER — Encounter (HOSPITAL_BASED_OUTPATIENT_CLINIC_OR_DEPARTMENT_OTHER): Payer: Self-pay

## 2019-08-15 DIAGNOSIS — R911 Solitary pulmonary nodule: Secondary | ICD-10-CM

## 2019-08-15 DIAGNOSIS — C180 Malignant neoplasm of cecum: Secondary | ICD-10-CM | POA: Diagnosis present

## 2019-08-15 MED ORDER — IOHEXOL 300 MG/ML  SOLN
100.0000 mL | Freq: Once | INTRAMUSCULAR | Status: AC | PRN
  Administered 2019-08-15: 14:00:00 100 mL via INTRAVENOUS

## 2019-08-15 MED FILL — GABAPENTIN 600 MG TABS: 600 | 30 days supply | Qty: 120 | Fill #2

## 2019-08-15 MED FILL — TRINTELLIX 5 MG TABLET: 5 | 30 days supply | Qty: 30 | Fill #0

## 2019-08-15 NOTE — Progress Notes (Unsigned)
Ct abd

## 2019-08-15 NOTE — Telephone Encounter (Addendum)
-----   Message from Volanda Napoleon, MD sent at 08/15/2019  3:01 PM EDT ----- Called the CT scan shows NO cancer!!!  Please stop smoking!!

## 2019-08-21 ENCOUNTER — Other Ambulatory Visit: Payer: Self-pay | Admitting: Family

## 2019-08-21 DIAGNOSIS — J430 Unilateral pulmonary emphysema [MacLeod's syndrome]: Secondary | ICD-10-CM

## 2019-08-21 DIAGNOSIS — G629 Polyneuropathy, unspecified: Secondary | ICD-10-CM

## 2019-08-21 DIAGNOSIS — C180 Malignant neoplasm of cecum: Secondary | ICD-10-CM

## 2019-08-21 MED FILL — ALPRAZolam 1 MG TABS: 1 | 30 days supply | Qty: 30 | Fill #2

## 2019-08-21 MED FILL — OXYCODONE-APAP 10-325: 10-325 | 10 days supply | Qty: 120 | Fill #0

## 2019-09-11 ENCOUNTER — Other Ambulatory Visit: Payer: Self-pay | Admitting: Family

## 2019-09-11 DIAGNOSIS — C180 Malignant neoplasm of cecum: Secondary | ICD-10-CM

## 2019-09-11 DIAGNOSIS — G629 Polyneuropathy, unspecified: Secondary | ICD-10-CM

## 2019-09-11 DIAGNOSIS — J430 Unilateral pulmonary emphysema [MacLeod's syndrome]: Secondary | ICD-10-CM

## 2019-09-11 MED FILL — OXYCODONE-APAP 10-325: 10-325 | 10 days supply | Qty: 120 | Fill #0

## 2019-09-11 MED FILL — BREO ELLIPTA 100-25 MCG INH: 100-25 | 30 days supply | Qty: 60 | Fill #1

## 2019-09-17 ENCOUNTER — Other Ambulatory Visit: Payer: Self-pay | Admitting: Hematology & Oncology

## 2019-09-17 DIAGNOSIS — C180 Malignant neoplasm of cecum: Secondary | ICD-10-CM

## 2019-09-17 DIAGNOSIS — J430 Unilateral pulmonary emphysema [MacLeod's syndrome]: Secondary | ICD-10-CM

## 2019-09-17 DIAGNOSIS — G629 Polyneuropathy, unspecified: Secondary | ICD-10-CM

## 2019-09-17 MED FILL — TRINTELLIX 5 MG TABLET: 5 | 30 days supply | Qty: 30 | Fill #1

## 2019-09-19 MED FILL — ALPRAZolam 1 MG TABS: 1 | 30 days supply | Qty: 30 | Fill #0

## 2019-09-27 MED FILL — GABAPENTIN 600 MG TABS: 600 | 30 days supply | Qty: 120 | Fill #3

## 2019-10-01 ENCOUNTER — Other Ambulatory Visit: Payer: Self-pay | Admitting: Family

## 2019-10-01 DIAGNOSIS — C180 Malignant neoplasm of cecum: Secondary | ICD-10-CM

## 2019-10-01 DIAGNOSIS — J430 Unilateral pulmonary emphysema [MacLeod's syndrome]: Secondary | ICD-10-CM

## 2019-10-01 DIAGNOSIS — G629 Polyneuropathy, unspecified: Secondary | ICD-10-CM

## 2019-10-01 MED FILL — OXYCODONE-APAP 10-325: 10-325 | 10 days supply | Qty: 120 | Fill #0

## 2019-10-17 MED FILL — ALPRAZolam 1 MG TABS: 1 | 30 days supply | Qty: 30 | Fill #1

## 2019-10-17 MED FILL — TRINTELLIX 5 MG TABLET: 5 | 30 days supply | Qty: 30 | Fill #2

## 2019-10-21 ENCOUNTER — Other Ambulatory Visit: Payer: Self-pay | Admitting: Family

## 2019-10-21 DIAGNOSIS — C180 Malignant neoplasm of cecum: Secondary | ICD-10-CM

## 2019-10-21 DIAGNOSIS — G629 Polyneuropathy, unspecified: Secondary | ICD-10-CM

## 2019-10-21 DIAGNOSIS — J430 Unilateral pulmonary emphysema [MacLeod's syndrome]: Secondary | ICD-10-CM

## 2019-10-21 MED FILL — OXYCODONE-APAP 10-325: 10-325 | 10 days supply | Qty: 120 | Fill #0

## 2019-10-25 MED FILL — PROAIR HFA 90 MCG INHALER: 108 (90 BAS | 25 days supply | Qty: 9 | Fill #1

## 2019-10-30 ENCOUNTER — Other Ambulatory Visit: Payer: Self-pay | Admitting: Hematology & Oncology

## 2019-10-30 DIAGNOSIS — G629 Polyneuropathy, unspecified: Secondary | ICD-10-CM

## 2019-10-30 DIAGNOSIS — J430 Unilateral pulmonary emphysema [MacLeod's syndrome]: Secondary | ICD-10-CM

## 2019-10-30 DIAGNOSIS — C180 Malignant neoplasm of cecum: Secondary | ICD-10-CM

## 2019-10-30 MED FILL — GABAPENTIN 600 MG TABLET: 600 | 30 days supply | Qty: 120 | Fill #0

## 2019-11-11 ENCOUNTER — Other Ambulatory Visit: Payer: Self-pay | Admitting: Family

## 2019-11-11 DIAGNOSIS — J430 Unilateral pulmonary emphysema [MacLeod's syndrome]: Secondary | ICD-10-CM

## 2019-11-11 DIAGNOSIS — G629 Polyneuropathy, unspecified: Secondary | ICD-10-CM

## 2019-11-11 DIAGNOSIS — C180 Malignant neoplasm of cecum: Secondary | ICD-10-CM

## 2019-11-11 MED FILL — OXYCODONE-APAP 10-325: 10-325 | 10 days supply | Qty: 120 | Fill #0

## 2019-11-18 ENCOUNTER — Other Ambulatory Visit: Payer: Self-pay | Admitting: Family

## 2019-11-18 MED FILL — ALPRAZolam 1 MG TABS: 1 | 30 days supply | Qty: 30 | Fill #2

## 2019-11-18 MED FILL — TRINTELLIX 5 MG TABLET: 5 | 30 days supply | Qty: 30 | Fill #0

## 2019-11-29 ENCOUNTER — Other Ambulatory Visit: Payer: Self-pay | Admitting: Family

## 2019-11-29 DIAGNOSIS — G629 Polyneuropathy, unspecified: Secondary | ICD-10-CM

## 2019-11-29 DIAGNOSIS — C180 Malignant neoplasm of cecum: Secondary | ICD-10-CM

## 2019-11-29 DIAGNOSIS — J430 Unilateral pulmonary emphysema [MacLeod's syndrome]: Secondary | ICD-10-CM

## 2019-12-02 ENCOUNTER — Other Ambulatory Visit: Payer: Self-pay | Admitting: *Deleted

## 2019-12-02 DIAGNOSIS — J430 Unilateral pulmonary emphysema [MacLeod's syndrome]: Secondary | ICD-10-CM

## 2019-12-02 DIAGNOSIS — C180 Malignant neoplasm of cecum: Secondary | ICD-10-CM

## 2019-12-02 DIAGNOSIS — G629 Polyneuropathy, unspecified: Secondary | ICD-10-CM

## 2019-12-02 MED ORDER — OXYCODONE-ACETAMINOPHEN 10-325 MG PO TABS: 1.0000 | ORAL_TABLET | ORAL | 0 refills | Status: DC | PRN

## 2019-12-02 MED FILL — OXYCODONE-APAP 10-325: 10-325 | 10 days supply | Qty: 120 | Fill #0

## 2019-12-04 MED FILL — GABAPENTIN 600 MG TABLET: 600 | 30 days supply | Qty: 120 | Fill #1

## 2019-12-18 ENCOUNTER — Other Ambulatory Visit: Payer: Self-pay | Admitting: Pulmonary Disease

## 2019-12-18 ENCOUNTER — Other Ambulatory Visit: Payer: Self-pay | Admitting: Hematology & Oncology

## 2019-12-18 DIAGNOSIS — C180 Malignant neoplasm of cecum: Secondary | ICD-10-CM

## 2019-12-18 DIAGNOSIS — G629 Polyneuropathy, unspecified: Secondary | ICD-10-CM

## 2019-12-18 DIAGNOSIS — J430 Unilateral pulmonary emphysema [MacLeod's syndrome]: Secondary | ICD-10-CM

## 2019-12-18 MED FILL — BREO ELLIPTA 100-25 MCG INH: 100-25 | 30 days supply | Qty: 60 | Fill #0

## 2019-12-18 MED FILL — TRINTELLIX 5 MG TABLET: 5 | 30 days supply | Qty: 30 | Fill #1

## 2019-12-18 MED FILL — ALPRAZolam 1 MG TABS: 1 | 30 days supply | Qty: 30 | Fill #0

## 2019-12-20 ENCOUNTER — Other Ambulatory Visit: Payer: Self-pay | Admitting: Hematology & Oncology

## 2019-12-20 DIAGNOSIS — G629 Polyneuropathy, unspecified: Secondary | ICD-10-CM

## 2019-12-20 DIAGNOSIS — J430 Unilateral pulmonary emphysema [MacLeod's syndrome]: Secondary | ICD-10-CM

## 2019-12-20 DIAGNOSIS — C180 Malignant neoplasm of cecum: Secondary | ICD-10-CM

## 2019-12-23 ENCOUNTER — Telehealth: Payer: Self-pay | Admitting: *Deleted

## 2019-12-23 NOTE — Telephone Encounter (Signed)
Patient here after requesting refill on her Oxycodone which was last prescribed 12/02/2019 and sent downstairs to [facility].  Asked Dr. Marin Olp about a refill and Dr. Marin Olp states he can only prescribe 120 tablets once a month and it is too soon to refill.

## 2019-12-24 ENCOUNTER — Other Ambulatory Visit: Payer: Self-pay | Admitting: *Deleted

## 2019-12-24 DIAGNOSIS — G629 Polyneuropathy, unspecified: Secondary | ICD-10-CM

## 2019-12-24 DIAGNOSIS — J430 Unilateral pulmonary emphysema [MacLeod's syndrome]: Secondary | ICD-10-CM

## 2019-12-24 DIAGNOSIS — C180 Malignant neoplasm of cecum: Secondary | ICD-10-CM

## 2019-12-24 MED ORDER — OXYCODONE-ACETAMINOPHEN 10-325 MG PO TABS: 1.0000 | ORAL_TABLET | ORAL | 0 refills | Status: DC | PRN

## 2019-12-24 MED FILL — OXYCODONE-APAP 10-325: 10-325 | 10 days supply | Qty: 120 | Fill #0

## 2020-01-06 MED FILL — GABAPENTIN 600 MG TABLET: 600 | 30 days supply | Qty: 120 | Fill #2

## 2020-01-07 ENCOUNTER — Encounter: Payer: Self-pay | Admitting: Pulmonary Disease

## 2020-01-07 ENCOUNTER — Other Ambulatory Visit: Payer: Self-pay

## 2020-01-07 ENCOUNTER — Ambulatory Visit (INDEPENDENT_AMBULATORY_CARE_PROVIDER_SITE_OTHER): Payer: 59 | Admitting: Pulmonary Disease

## 2020-01-07 DIAGNOSIS — J432 Centrilobular emphysema: Secondary | ICD-10-CM

## 2020-01-07 DIAGNOSIS — Z720 Tobacco use: Secondary | ICD-10-CM | POA: Diagnosis not present

## 2020-01-07 MED ORDER — VARENICLINE TARTRATE 1 MG PO TABS: 1.0000 mg | ORAL_TABLET | Freq: Two times a day (BID) | ORAL | 2 refills | Status: DC

## 2020-01-07 MED ORDER — CHANTIX STARTING MONTH PAK 0.5 MG X 11 & 1 MG X 42 PO TABS: ORAL_TABLET | ORAL | 0 refills | Status: DC

## 2020-01-07 MED FILL — CHANTIX STARTING MONTH BOX: 0.5 MG X 11 | 28 days supply | Qty: 53 | Fill #0

## 2020-01-07 NOTE — Assessment & Plan Note (Signed)
Lets set April 26 as your quit date You can try nicotine patches 21 mg/day Prescription for Chantix starter pack will be sent in

## 2020-01-07 NOTE — Patient Instructions (Signed)
Refills on Breo.  Lets set April 26 as your quit date You can try nicotine patches 21 mg/day Prescription for Chantix starter pack will be sent in

## 2020-01-07 NOTE — Assessment & Plan Note (Signed)
Appears stable by history Refills on Breo. Emphasized smoking cessation

## 2020-01-07 NOTE — Assessment & Plan Note (Signed)
Stable on follow-up imaging Continue screening CT

## 2020-01-07 NOTE — Progress Notes (Signed)
   Subjective:    Patient ID: Teresa Morgan, female    DOB: 13-Jun-1956, 64 y.o.   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  Hx of Cecum adenocarcinoma Stage III B f/by Dr. Martha Clan. S/p resection and chemo 2012-2013  Continues to smoke 1 pack/day. No intercurrent exacerbations, breathing is stable, stays on Breo Screening CT from 2020 was reviewed.  In the past she was able to quit for 10 years She has gotten her first Covid shot  Significant tests/ events reviewed  12/2017 Spirometry  shows mild airway obstruction with ratio 69 and FEV1 77%  CT chest in 2015 with tiny nodules .  CT chest in 01/2016 with stable right lung nodules , additional nodules improved or resolved. LDCT 08/2019 RADS-2 , tiny nodules 24mm on RT  Review of Systems Patient denies significant dyspnea,cough, hemoptysis,  chest pain, palpitations, pedal edema, orthopnea, paroxysmal nocturnal dyspnea, lightheadedness, nausea, vomiting, abdominal or  leg pains      Objective:   Physical Exam  Gen. Pleasant, well-nourished, in no distress ENT - no thrush, no pallor/icterus,no post nasal drip Neck: No JVD, no thyromegaly, no carotid bruits Lungs: no use of accessory muscles, no dullness to percussion, clear without rales or rhonchi  Cardiovascular: Rhythm regular, heart sounds  normal, no murmurs or gallops, no peripheral edema Musculoskeletal: No deformities, no cyanosis or clubbing        Assessment & Plan:

## 2020-01-13 ENCOUNTER — Other Ambulatory Visit: Payer: Self-pay | Admitting: Hematology & Oncology

## 2020-01-13 DIAGNOSIS — C180 Malignant neoplasm of cecum: Secondary | ICD-10-CM

## 2020-01-13 DIAGNOSIS — J430 Unilateral pulmonary emphysema [MacLeod's syndrome]: Secondary | ICD-10-CM

## 2020-01-13 DIAGNOSIS — G629 Polyneuropathy, unspecified: Secondary | ICD-10-CM

## 2020-01-13 MED FILL — OXYCODONE-APAP 10-325: 10-325 | 10 days supply | Qty: 120 | Fill #0

## 2020-01-16 MED FILL — TRINTELLIX 5 MG TABLET: 5 | 30 days supply | Qty: 30 | Fill #2

## 2020-01-16 MED FILL — ALPRAZolam 1 MG TABS: 1 | 30 days supply | Qty: 30 | Fill #1

## 2020-02-03 ENCOUNTER — Other Ambulatory Visit: Payer: Self-pay | Admitting: Hematology & Oncology

## 2020-02-03 DIAGNOSIS — J430 Unilateral pulmonary emphysema [MacLeod's syndrome]: Secondary | ICD-10-CM

## 2020-02-03 DIAGNOSIS — C180 Malignant neoplasm of cecum: Secondary | ICD-10-CM

## 2020-02-03 DIAGNOSIS — G629 Polyneuropathy, unspecified: Secondary | ICD-10-CM

## 2020-02-03 MED FILL — OXYCODONE-APAP 10-325: 10-325 | 10 days supply | Qty: 120 | Fill #0

## 2020-02-10 MED FILL — BREO ELLIPTA 100-25 MCG INH: 100-25 | 30 days supply | Qty: 60 | Fill #1

## 2020-02-10 MED FILL — GABAPENTIN 600 MG TABLET: 600 | 30 days supply | Qty: 120 | Fill #3

## 2020-02-13 ENCOUNTER — Other Ambulatory Visit: Payer: Self-pay | Admitting: Family

## 2020-02-13 MED FILL — ALPRAZolam 1 MG TABS: 1 | 30 days supply | Qty: 30 | Fill #2

## 2020-02-13 MED FILL — TRINTELLIX 5 MG TABLET: 5 | 30 days supply | Qty: 30 | Fill #0

## 2020-02-24 ENCOUNTER — Other Ambulatory Visit: Payer: Self-pay | Admitting: Hematology & Oncology

## 2020-02-24 DIAGNOSIS — J430 Unilateral pulmonary emphysema [MacLeod's syndrome]: Secondary | ICD-10-CM

## 2020-02-24 DIAGNOSIS — C180 Malignant neoplasm of cecum: Secondary | ICD-10-CM

## 2020-02-24 DIAGNOSIS — G629 Polyneuropathy, unspecified: Secondary | ICD-10-CM

## 2020-02-24 MED FILL — OXYCODONE-APAP 10-325: 10-325 | 10 days supply | Qty: 120 | Fill #0

## 2020-02-25 ENCOUNTER — Other Ambulatory Visit: Payer: Self-pay | Admitting: *Deleted

## 2020-02-25 DIAGNOSIS — G629 Polyneuropathy, unspecified: Secondary | ICD-10-CM

## 2020-02-25 DIAGNOSIS — J430 Unilateral pulmonary emphysema [MacLeod's syndrome]: Secondary | ICD-10-CM

## 2020-02-25 DIAGNOSIS — C180 Malignant neoplasm of cecum: Secondary | ICD-10-CM

## 2020-02-27 ENCOUNTER — Telehealth: Payer: Self-pay | Admitting: Pulmonary Disease

## 2020-02-27 NOTE — Telephone Encounter (Signed)
Spoke with pt. States that she wants to be switched to Advair due to cost. [facility] recommended that she call to inquire about this. Advair is going to cost her $50 less than Breo.  Dr. Elsworth Soho - please advise. Thanks.

## 2020-02-28 MED ORDER — FLUTICASONE-SALMETEROL 250-50 MCG/DOSE IN AEPB: 1.0000 | INHALATION_SPRAY | Freq: Two times a day (BID) | RESPIRATORY_TRACT | 3 refills | Status: DC

## 2020-02-28 NOTE — Telephone Encounter (Signed)
Spoke with pt and advised rx for sent [facility] to pharmacy. Nothing further is needed.

## 2020-02-28 NOTE — Telephone Encounter (Signed)
Okay to change to Advair 250/50 1 puff twice daily

## 2020-03-09 ENCOUNTER — Other Ambulatory Visit: Payer: Self-pay | Admitting: *Deleted

## 2020-03-09 DIAGNOSIS — J430 Unilateral pulmonary emphysema [MacLeod's syndrome]: Secondary | ICD-10-CM

## 2020-03-09 DIAGNOSIS — C180 Malignant neoplasm of cecum: Secondary | ICD-10-CM

## 2020-03-09 DIAGNOSIS — G629 Polyneuropathy, unspecified: Secondary | ICD-10-CM

## 2020-03-09 MED ORDER — GABAPENTIN 600 MG PO TABS: 600.0000 mg | ORAL_TABLET | Freq: Four times a day (QID) | ORAL | 3 refills | Status: DC

## 2020-03-13 ENCOUNTER — Other Ambulatory Visit: Payer: Self-pay | Admitting: Hematology & Oncology

## 2020-03-13 DIAGNOSIS — G629 Polyneuropathy, unspecified: Secondary | ICD-10-CM

## 2020-03-13 DIAGNOSIS — C180 Malignant neoplasm of cecum: Secondary | ICD-10-CM

## 2020-03-13 DIAGNOSIS — J430 Unilateral pulmonary emphysema [MacLeod's syndrome]: Secondary | ICD-10-CM

## 2020-03-13 MED FILL — ALPRAZolam 1 MG TABS: 1 | 30 days supply | Qty: 30 | Fill #0

## 2020-03-16 ENCOUNTER — Other Ambulatory Visit: Payer: Self-pay | Admitting: Hematology & Oncology

## 2020-03-16 DIAGNOSIS — G629 Polyneuropathy, unspecified: Secondary | ICD-10-CM

## 2020-03-16 DIAGNOSIS — C180 Malignant neoplasm of cecum: Secondary | ICD-10-CM

## 2020-03-16 DIAGNOSIS — J430 Unilateral pulmonary emphysema [MacLeod's syndrome]: Secondary | ICD-10-CM

## 2020-03-16 MED FILL — TRINTELLIX 5 MG TABLET: 5 | 30 days supply | Qty: 30 | Fill #1

## 2020-03-16 MED FILL — OXYCODONE-APAP 10-325: 10-325 | 10 days supply | Qty: 120 | Fill #0

## 2020-03-18 ENCOUNTER — Other Ambulatory Visit: Payer: Self-pay

## 2020-03-18 MED ORDER — VORTIOXETINE HBR 5 MG PO TABS: 5.0000 mg | ORAL_TABLET | Freq: Every day | ORAL | 0 refills | Status: DC

## 2020-03-26 ENCOUNTER — Other Ambulatory Visit: Payer: Self-pay | Admitting: Hematology & Oncology

## 2020-03-26 DIAGNOSIS — C180 Malignant neoplasm of cecum: Secondary | ICD-10-CM

## 2020-03-26 DIAGNOSIS — J430 Unilateral pulmonary emphysema [MacLeod's syndrome]: Secondary | ICD-10-CM

## 2020-03-26 DIAGNOSIS — G629 Polyneuropathy, unspecified: Secondary | ICD-10-CM

## 2020-04-06 ENCOUNTER — Other Ambulatory Visit: Payer: Self-pay | Admitting: Hematology & Oncology

## 2020-04-06 DIAGNOSIS — J430 Unilateral pulmonary emphysema [MacLeod's syndrome]: Secondary | ICD-10-CM

## 2020-04-06 DIAGNOSIS — C180 Malignant neoplasm of cecum: Secondary | ICD-10-CM

## 2020-04-06 DIAGNOSIS — G629 Polyneuropathy, unspecified: Secondary | ICD-10-CM

## 2020-04-07 MED FILL — OXYCODONE-APAP 10-325: 10-325 | 10 days supply | Qty: 120 | Fill #0

## 2020-04-14 MED FILL — TRINTELLIX 5 MG TABLET: 5 | 30 days supply | Qty: 30 | Fill #2

## 2020-04-14 MED FILL — ALPRAZolam 1 MG TABS: 1 | 30 days supply | Qty: 30 | Fill #1

## 2020-04-23 ENCOUNTER — Other Ambulatory Visit: Payer: Self-pay | Admitting: Pulmonary Disease

## 2020-04-24 ENCOUNTER — Other Ambulatory Visit: Payer: Self-pay | Admitting: Pulmonary Disease

## 2020-04-27 ENCOUNTER — Other Ambulatory Visit: Payer: Self-pay | Admitting: *Deleted

## 2020-04-27 ENCOUNTER — Other Ambulatory Visit: Payer: Self-pay | Admitting: Hematology & Oncology

## 2020-04-27 DIAGNOSIS — C180 Malignant neoplasm of cecum: Secondary | ICD-10-CM

## 2020-04-27 DIAGNOSIS — G629 Polyneuropathy, unspecified: Secondary | ICD-10-CM

## 2020-04-27 DIAGNOSIS — J430 Unilateral pulmonary emphysema [MacLeod's syndrome]: Secondary | ICD-10-CM

## 2020-04-28 MED ORDER — OXYCODONE-ACETAMINOPHEN 10-325 MG PO TABS: 1.0000 | ORAL_TABLET | ORAL | 0 refills | Status: DC | PRN

## 2020-04-28 MED FILL — OXYCODONE-APAP 10-325: 10-325 | 10 days supply | Qty: 120 | Fill #0

## 2020-05-08 ENCOUNTER — Encounter: Payer: 59 | Admitting: Family

## 2020-05-11 ENCOUNTER — Other Ambulatory Visit: Payer: Self-pay | Admitting: Family

## 2020-05-12 MED FILL — TRINTELLIX 5 MG TABLET: 5 | 30 days supply | Qty: 30 | Fill #0

## 2020-05-13 MED FILL — ALPRAZolam 1 MG TABS: 1 | 30 days supply | Qty: 30 | Fill #2

## 2020-05-18 ENCOUNTER — Other Ambulatory Visit: Payer: Self-pay | Admitting: Hematology & Oncology

## 2020-05-18 DIAGNOSIS — G629 Polyneuropathy, unspecified: Secondary | ICD-10-CM

## 2020-05-18 DIAGNOSIS — J430 Unilateral pulmonary emphysema [MacLeod's syndrome]: Secondary | ICD-10-CM

## 2020-05-18 DIAGNOSIS — C180 Malignant neoplasm of cecum: Secondary | ICD-10-CM

## 2020-05-18 MED FILL — GABAPENTIN 600 MG TABLET: 600 | 30 days supply | Qty: 120 | Fill #2

## 2020-05-18 MED FILL — BREO ELLIPTA 100-25 MCG INH: 100-25 | 30 days supply | Qty: 60 | Fill #1

## 2020-05-20 ENCOUNTER — Encounter: Payer: Self-pay | Admitting: Family

## 2020-05-20 ENCOUNTER — Ambulatory Visit (INDEPENDENT_AMBULATORY_CARE_PROVIDER_SITE_OTHER): Payer: Medicare Other | Admitting: Family

## 2020-05-20 ENCOUNTER — Other Ambulatory Visit: Payer: Self-pay

## 2020-05-20 ENCOUNTER — Other Ambulatory Visit (HOSPITAL_COMMUNITY)
Admission: RE | Admit: 2020-05-20 | Discharge: 2020-05-20 | Disposition: A | Discharge status: Home / Self Care | Payer: Medicare Other | Source: Ambulatory Visit | Attending: Family | Admitting: Family

## 2020-05-20 ENCOUNTER — Other Ambulatory Visit: Payer: Self-pay | Admitting: *Deleted

## 2020-05-20 VITALS — BP 122/74 | HR 91 | Temp 97.5°F | Resp 16 | Ht 67.0 in | Wt 154.1 lb

## 2020-05-20 DIAGNOSIS — G629 Polyneuropathy, unspecified: Secondary | ICD-10-CM

## 2020-05-20 DIAGNOSIS — Z23 Encounter for immunization: Secondary | ICD-10-CM | POA: Diagnosis not present

## 2020-05-20 DIAGNOSIS — Z01419 Encounter for gynecological examination (general) (routine) without abnormal findings: Secondary | ICD-10-CM | POA: Diagnosis present

## 2020-05-20 DIAGNOSIS — C180 Malignant neoplasm of cecum: Secondary | ICD-10-CM

## 2020-05-20 DIAGNOSIS — J430 Unilateral pulmonary emphysema [MacLeod's syndrome]: Secondary | ICD-10-CM

## 2020-05-20 DIAGNOSIS — Z1151 Encounter for screening for human papillomavirus (HPV): Secondary | ICD-10-CM | POA: Diagnosis not present

## 2020-05-20 DIAGNOSIS — Z0000 Encounter for general adult medical examination without abnormal findings: Secondary | ICD-10-CM

## 2020-05-20 DIAGNOSIS — Z780 Asymptomatic menopausal state: Secondary | ICD-10-CM

## 2020-05-20 NOTE — Addendum Note (Signed)
Addended by: Jiles Prows on: 05/20/2020 02:46 PM   Modules accepted: Orders

## 2020-05-20 NOTE — Progress Notes (Signed)
Pre visit review using our clinic review tool, if applicable. No additional management support is needed unless otherwise documented below in the visit note. 

## 2020-05-20 NOTE — Progress Notes (Signed)
Subjective:    Patient ID: Teresa Morgan, female    DOB: [birth year], 64 y.o.   MRN: [id number]  HPI  Patient presents today for complete physical.  Immunizations:  Completed moderna series  Diet:  Healthy Wt Readings from Last 3 Encounters:  05/20/20 154 lb 2 oz (69.9 kg)  01/07/20 156 lb 12.8 oz (71.1 kg)  07/26/19 153 lb (69.4 kg)  Exercise:  Works in her yard Colonoscopy: 04/23/18- due in 2024 Dexa: due Pap Smear: due Mammogram: due Vision: due     Review of Systems  Constitutional: Negative for unexpected weight change.  HENT: Negative for rhinorrhea.   Eyes: Negative for visual disturbance.  Respiratory: Positive for cough (mild cough due to allergies).   Cardiovascular: Negative for leg swelling.  Gastrointestinal: Negative for constipation and diarrhea.  Genitourinary: Negative for dysuria, frequency and hematuria.  Musculoskeletal: Negative for arthralgias.  Skin: Negative for rash.  Neurological: Negative for headaches.  Hematological: Negative for adenopathy.  Psychiatric/Behavioral:       Denies depression/anxiety   Past Medical History:  Diagnosis Date  . Anemia   . Anxiety   . Arthritis   . Cataract    bilat removed  . Colon cancer [facility]) 2013   cecal cancer  . COPD with emphysema ([facility]) 06/18/2012  . Depression   . GERD (gastroesophageal reflux disease)   . Neuromuscular disorder ([place])    neuropathy from chemo  . Peptic ulcer   . Pneumonia   . Shortness of breath dyspnea    with exertion     Social History   Socioeconomic History  . Marital status: Married    Spouse name: Not on file  . Number of children: 3  . Years of education: Not on file  . Highest education level: Not on file  Occupational History  . Occupation: Armed forces operational officer: [employer]  Tobacco Use  . Smoking status: Current Every Day Smoker    Packs/day: 1.00    Years: 37.00    Pack years: 37.00    Types: Cigarettes    Start date: 04/23/1986    Last attempt to  quit: 12/25/2017    Years since quitting: 2.4  . Smokeless tobacco: Never Used  Substance and Sexual Activity  . Alcohol use: Yes    Alcohol/week: 0.0 standard drinks    Comment: social  . Drug use: No  . Sexual activity: Not on file  Other Topics Concern  . 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 Difficulty of Paying Living Expenses:   Food Insecurity:   . Worried About Charity fundraiser in the Last Year:   . Arboriculturist in the Last Year:   Transportation Needs:   . Film/video editor (Medical):   Marland Kitchen Lack of Transportation (Non-Medical):   Physical Activity:   . Days of Exercise per Week:   . Minutes of Exercise per Session:   Stress:   . Feeling of Stress :   Social Connections:   . Frequency of Communication with Friends and Family:   . Frequency of Social Gatherings with Friends and Family:   . Attends Religious Services:   . Active Member of Clubs or Organizations:   . Attends Archivist Meetings:   .  Marital Status:   Intimate Partner Violence:   . Fear of Current or Ex-Partner:   . Emotionally Abused:   Marland Kitchen Physically Abused:   . Sexually Abused:     Past Surgical History:  Procedure Laterality Date  . APPENDECTOMY  07/07/11  . BREAST EXCISIONAL BIOPSY Right 03/15/2018  . COLON RESECTION  2012   for colon cancer  . COLONOSCOPY    . EYE SURGERY Bilateral    cataract surgery with lens implant  . heel spurs Right   . hemrrhoids    . PLANTAR FASCIA SURGERY Right 2009  . PORT-A-CATH REMOVAL Left 04/19/2016   Procedure: REMOVAL PORT-A-CATH;  Surgeon: [name], MD;  Location: [facility];  Service: General;  Laterality: Left;  . portacath    . RADIOACTIVE SEED GUIDED EXCISIONAL BREAST BIOPSY Right 03/15/2018   Procedure: RADIOACTIVE SEED GUIDED EXCISIONAL BREAST BIOPSY;  Surgeon: [name], MD;  Location: [facility];  Service: General;  Laterality: Right;  . TUBAL LIGATION  07/1980    Family History  Problem Relation Age of Onset  . Diabetes Mother   . Colon cancer Cousin   . Colon cancer Cousin   . Thalassemia Daughter   . Colon polyps Neg Hx   . Rectal cancer Neg Hx   . Stomach cancer Neg Hx     Allergies  Allergen Reactions  . Aspirin     Upset stomach  . Cymbalta [Duloxetine Hcl] Swelling    Swollen lips   . Prozac [Fluoxetine Hcl] Swelling    Lip swelling  . Wellbutrin [Bupropion] Swelling    Lip swelling    Current Outpatient Medications on File Prior to Visit  Medication Sig Dispense Refill  . albuterol (PROAIR HFA) 108 (90 Base) MCG/ACT inhaler Inhale 2 puffs into the lungs every 6 (six) hours as needed for wheezing or shortness of breath. 6.7 g 1  . ALPRAZolam (XANAX) 1 MG tablet TAKE 1 TABLET BY MOUTH AT BEDTIME AS NEEDED FOR ANXIETY OR SLEEP 30 tablet 2  . Calcium-Vitamin D 600-125 MG-UNIT TABS Take 1 tablet by mouth daily.     . fluticasone furoate-vilanterol (BREO ELLIPTA) 100-25 MCG/INH AEPB INHALE 1 PUFF BY MOUTH INTO THE LUNGS ONCE DAILY 60 each 6  . Fluticasone-Salmeterol (ADVAIR) 250-50 MCG/DOSE AEPB Inhale 1 puff into the lungs every 12 (twelve) hours. 180 each 3  . gabapentin (NEURONTIN) 600 MG tablet TAKE 1 TABLET (600 MG TOTAL) BY MOUTH 4 (FOUR) TIMES DAILY. 120 tablet 3  . Multiple Vitamin (MULTIVITAMIN) tablet Take 1 tablet by mouth daily.      Marland Kitchen oxyCODONE-acetaminophen (PERCOCET) 10-325 MG tablet Take 1-2 tablets by mouth every 4 (four) hours as needed. for pain 120 tablet 0  . Pyridoxine HCl (VITAMIN B-6) 250 MG tablet Take 1 tablet (250 mg total) by mouth daily. 30 tablet 12  . TURMERIC PO Take 1 tablet by mouth daily.     . varenicline (CHANTIX CONTINUING MONTH PAK) 1 MG tablet Take 1 tablet (1 mg total) by mouth 2 (two) times daily. 60 tablet 2  . varenicline (CHANTIX STARTING MONTH PAK) 0.5 MG X 11 & 1 MG X 42  tablet Take one 0.5 mg tablet by mouth once daily for 3 days, then increase to one 0.5 mg tablet twice daily for 4 days, then increase to one 1 mg tablet twice daily. 53 tablet 0  . vitamin E 200 UNIT capsule Take 200 Units by mouth daily.      Marland Kitchen vortioxetine HBr (  TRINTELLIX) 5 MG TABS tablet Take 1 tablet (5 mg total) by mouth daily. 30 tablet 0   No current facility-administered medications on file prior to visit.    BP 122/74 (BP Location: Right Arm, Patient Position: Sitting, Cuff Size: Normal)   Pulse 91   Temp (!) 97.5 F (36.4 C) (Temporal)   Resp 16   Ht 5\' 7"  (1.702 m)   Wt 154 lb 2 oz (69.9 kg)   SpO2 97%   BMI 24.14 kg/m       Objective:   Physical Exam  Physical Exam  Constitutional: She is oriented to person, place, and time. She appears well-developed and well-nourished. No distress.  HENT:  Head: Normocephalic and atraumatic.  Right Ear: Tympanic membrane and ear canal normal.  Left Ear: Tympanic membrane and ear canal normal.  Mouth/Throat: not examined- pt wearing mask Eyes: Pupils are equal, round, and reactive to light. No scleral icterus.  Neck: Normal range of motion. No thyromegaly present.  Cardiovascular: Normal rate and regular rhythm.   No murmur heard. Pulmonary/Chest: Effort normal and breath sounds normal. No respiratory distress. He has no wheezes. She has no rales. She exhibits no tenderness.  Abdominal: Soft. Bowel sounds are normal. She exhibits no distension and no mass. There is no tenderness. There is no rebound and no guarding.  Musculoskeletal: She exhibits no edema.  Lymphadenopathy:    She has no cervical adenopathy.  Neurological: She is alert and oriented to person, place, and time. She has normal patellar reflexes. She exhibits normal muscle tone. Coordination normal.  Skin: Skin is warm and dry.  Psychiatric: She has a normal mood and affect. Her behavior is normal. Judgment and thought content normal.  Breasts: Examined  lying Right: Without masses, retractions, discharge or axillary adenopathy.  Left: Without masses, retractions, discharge or axillary adenopathy.  Inguinal/mons: Normal without inguinal adenopathy  External genitalia: Normal  BUS/Urethra/Skene's glands: Normal  Bladder: Normal  Vagina: Normal  Cervix: Normal  Uterus: normal in size, shape and contour. Midline and mobile  Adnexa/parametria:  Rt: Without masses or tenderness.  Lt: Without masses or tenderness.  Anus and perineum: Normal            Assessment & Plan:   Preventative care- due for mammo, dexa. Orders placed.  Immunizations reviewed and  Up to date. She does not have her shot records with her but will bring next visit (re: covid vaccine record).  Encouraged smoking cessation. Continue healthy diet and regular exercise. Pap performed today.   This visit occurred during the SARS-CoV-2 public health emergency.  Safety protocols were in place, including screening questions prior to the visit, additional usage of staff PPE, and extensive cleaning of exam room while observing appropriate contact time as indicated for disinfecting solutions.         Assessment & Plan:

## 2020-05-20 NOTE — Patient Instructions (Addendum)
Please schedule a routine follow up appointment with your eye doctor. Please schedule mammogram/bone density on the first floor.  Continue healthy diet and regular exercise.

## 2020-05-21 LAB — CYTOLOGY - PAP
Comment: NEGATIVE
Diagnosis: NEGATIVE
High risk HPV: NEGATIVE

## 2020-05-21 MED ORDER — OXYCODONE-ACETAMINOPHEN 10-325 MG PO TABS: 1.0000 | ORAL_TABLET | ORAL | 0 refills | Status: DC | PRN

## 2020-05-21 MED FILL — OXYCODONE-APAP 10-325: 10-325 | 10 days supply | Qty: 120 | Fill #0

## 2020-05-25 ENCOUNTER — Ambulatory Visit (HOSPITAL_BASED_OUTPATIENT_CLINIC_OR_DEPARTMENT_OTHER)
Admission: RE | Admit: 2020-05-25 | Discharge: 2020-05-25 | Disposition: A | Discharge status: Home / Self Care | Payer: Medicare Other | Source: Ambulatory Visit | Attending: Family | Admitting: Family

## 2020-05-25 ENCOUNTER — Other Ambulatory Visit: Payer: Self-pay

## 2020-05-25 ENCOUNTER — Encounter (HOSPITAL_BASED_OUTPATIENT_CLINIC_OR_DEPARTMENT_OTHER): Payer: Self-pay

## 2020-05-25 DIAGNOSIS — R29890 Loss of height: Secondary | ICD-10-CM | POA: Diagnosis not present

## 2020-05-25 DIAGNOSIS — Z780 Asymptomatic menopausal state: Secondary | ICD-10-CM | POA: Diagnosis not present

## 2020-05-25 DIAGNOSIS — Z1231 Encounter for screening mammogram for malignant neoplasm of breast: Secondary | ICD-10-CM | POA: Insufficient documentation

## 2020-05-25 DIAGNOSIS — Z13820 Encounter for screening for osteoporosis: Secondary | ICD-10-CM | POA: Diagnosis not present

## 2020-05-25 DIAGNOSIS — Z0000 Encounter for general adult medical examination without abnormal findings: Secondary | ICD-10-CM

## 2020-06-11 ENCOUNTER — Other Ambulatory Visit: Payer: Self-pay | Admitting: Hematology & Oncology

## 2020-06-11 DIAGNOSIS — C180 Malignant neoplasm of cecum: Secondary | ICD-10-CM

## 2020-06-11 DIAGNOSIS — J430 Unilateral pulmonary emphysema [MacLeod's syndrome]: Secondary | ICD-10-CM

## 2020-06-11 DIAGNOSIS — G629 Polyneuropathy, unspecified: Secondary | ICD-10-CM

## 2020-06-12 ENCOUNTER — Other Ambulatory Visit: Payer: Self-pay | Admitting: Family

## 2020-06-12 ENCOUNTER — Other Ambulatory Visit: Payer: Self-pay | Admitting: Hematology & Oncology

## 2020-06-12 DIAGNOSIS — J430 Unilateral pulmonary emphysema [MacLeod's syndrome]: Secondary | ICD-10-CM

## 2020-06-12 DIAGNOSIS — C180 Malignant neoplasm of cecum: Secondary | ICD-10-CM

## 2020-06-12 DIAGNOSIS — G629 Polyneuropathy, unspecified: Secondary | ICD-10-CM

## 2020-06-12 MED FILL — OXYCODONE-APAP 10-325: 10-325 | 10 days supply | Qty: 120 | Fill #0

## 2020-06-12 MED FILL — ALPRAZolam 1 MG TABS: 1 | 30 days supply | Qty: 30 | Fill #0

## 2020-06-12 MED FILL — TRINTELLIX 5 MG TABLET: 5 | 90 days supply | Qty: 90 | Fill #0

## 2020-07-02 ENCOUNTER — Other Ambulatory Visit: Payer: Self-pay | Admitting: Hematology & Oncology

## 2020-07-02 DIAGNOSIS — G629 Polyneuropathy, unspecified: Secondary | ICD-10-CM

## 2020-07-02 DIAGNOSIS — C180 Malignant neoplasm of cecum: Secondary | ICD-10-CM

## 2020-07-02 DIAGNOSIS — J430 Unilateral pulmonary emphysema [MacLeod's syndrome]: Secondary | ICD-10-CM

## 2020-07-02 MED FILL — GABAPENTIN 600 MG TABLET: 600 | 30 days supply | Qty: 120 | Fill #3

## 2020-07-03 ENCOUNTER — Other Ambulatory Visit: Payer: Self-pay | Admitting: *Deleted

## 2020-07-03 DIAGNOSIS — J430 Unilateral pulmonary emphysema [MacLeod's syndrome]: Secondary | ICD-10-CM

## 2020-07-03 DIAGNOSIS — G629 Polyneuropathy, unspecified: Secondary | ICD-10-CM

## 2020-07-03 DIAGNOSIS — C180 Malignant neoplasm of cecum: Secondary | ICD-10-CM

## 2020-07-03 MED ORDER — OXYCODONE-ACETAMINOPHEN 10-325 MG PO TABS: 1.0000 | ORAL_TABLET | ORAL | 0 refills | Status: DC | PRN

## 2020-07-03 MED FILL — OXYCODONE-APAP 10-325: 10-325 | 10 days supply | Qty: 120 | Fill #0

## 2020-07-06 MED FILL — BREO ELLIPTA 100-25 MCG INH: 100-25 | 30 days supply | Qty: 60 | Fill #2

## 2020-07-09 DIAGNOSIS — S50869A Insect bite (nonvenomous) of unspecified forearm, initial encounter: Secondary | ICD-10-CM | POA: Diagnosis not present

## 2020-07-09 DIAGNOSIS — T63461A Toxic effect of venom of wasps, accidental (unintentional), initial encounter: Secondary | ICD-10-CM | POA: Diagnosis not present

## 2020-07-10 MED FILL — ALPRAZolam 1 MG TABS: 1 | 30 days supply | Qty: 30 | Fill #1

## 2020-07-24 ENCOUNTER — Other Ambulatory Visit: Payer: Self-pay

## 2020-07-24 ENCOUNTER — Inpatient Hospital Stay: Payer: Medicare Other

## 2020-07-24 ENCOUNTER — Other Ambulatory Visit: Payer: Self-pay | Admitting: Hematology & Oncology

## 2020-07-24 ENCOUNTER — Inpatient Hospital Stay: Payer: Medicare Other | Attending: Hematology & Oncology | Admitting: Hematology & Oncology

## 2020-07-24 VITALS — BP 135/75 | HR 101 | Temp 98.4°F | Resp 17 | Wt 151.0 lb

## 2020-07-24 DIAGNOSIS — Z79899 Other long term (current) drug therapy: Secondary | ICD-10-CM | POA: Diagnosis not present

## 2020-07-24 DIAGNOSIS — G629 Polyneuropathy, unspecified: Secondary | ICD-10-CM | POA: Diagnosis not present

## 2020-07-24 DIAGNOSIS — Z9221 Personal history of antineoplastic chemotherapy: Secondary | ICD-10-CM | POA: Diagnosis not present

## 2020-07-24 DIAGNOSIS — C180 Malignant neoplasm of cecum: Secondary | ICD-10-CM | POA: Diagnosis not present

## 2020-07-24 DIAGNOSIS — Z7951 Long term (current) use of inhaled steroids: Secondary | ICD-10-CM | POA: Diagnosis not present

## 2020-07-24 DIAGNOSIS — R970 Elevated carcinoembryonic antigen [CEA]: Secondary | ICD-10-CM | POA: Diagnosis not present

## 2020-07-24 DIAGNOSIS — Z85038 Personal history of other malignant neoplasm of large intestine: Secondary | ICD-10-CM | POA: Insufficient documentation

## 2020-07-24 DIAGNOSIS — J430 Unilateral pulmonary emphysema [MacLeod's syndrome]: Secondary | ICD-10-CM

## 2020-07-24 LAB — CMP (CANCER CENTER ONLY)
ALT: 13 U/L (ref 0–44)
AST: 13 U/L — ABNORMAL LOW (ref 15–41)
Albumin: 4.1 g/dL (ref 3.5–5.0)
Alkaline Phosphatase: 75 U/L (ref 38–126)
Anion gap: 7 (ref 5–15)
BUN: 10 mg/dL (ref 8–23)
CO2: 29 mmol/L (ref 22–32)
Calcium: 9.4 mg/dL (ref 8.9–10.3)
Chloride: 104 mmol/L (ref 98–111)
Creatinine: 0.91 mg/dL (ref 0.44–1.00)
GFR, Est AFR Am: >60 mL/min (ref >60)
GFR, Estimated: >60 mL/min (ref >60)
Glucose, Bld: 142 mg/dL — ABNORMAL HIGH (ref 70–99)
Potassium: 3.7 mmol/L (ref 3.5–5.1)
Sodium: 140 mmol/L (ref 135–145)
Total Bilirubin: 0.4 mg/dL (ref 0.3–1.2)
Total Protein: 6.9 g/dL (ref 6.5–8.1)

## 2020-07-24 LAB — CBC WITH DIFFERENTIAL (CANCER CENTER ONLY)
Abs Immature Granulocytes: 0.05 10*3/uL (ref 0.00–0.07)
Basophils Absolute: 0 10*3/uL (ref 0.0–0.1)
Basophils Relative: 0 %
Eosinophils Absolute: 0.1 10*3/uL (ref 0.0–0.5)
Eosinophils Relative: 1 %
HCT: 45 % (ref 36.0–46.0)
Hemoglobin: 14.4 g/dL (ref 12.0–15.0)
Immature Granulocytes: 1 %
Lymphocytes Relative: 22 %
Lymphs Abs: 2.1 10*3/uL (ref 0.7–4.0)
MCH: 23.6 pg — ABNORMAL LOW (ref 26.0–34.0)
MCHC: 32 g/dL (ref 30.0–36.0)
MCV: 73.8 fL — ABNORMAL LOW (ref 80.0–100.0)
Monocytes Absolute: 0.4 10*3/uL (ref 0.1–1.0)
Monocytes Relative: 4 %
Neutro Abs: 7 10*3/uL (ref 1.7–7.7)
Neutrophils Relative %: 72 %
Platelet Count: 159 10*3/uL (ref 150–400)
RBC: 6.1 MIL/uL — ABNORMAL HIGH (ref 3.87–5.11)
RDW: 16.2 % — ABNORMAL HIGH (ref 11.5–15.5)
WBC Count: 9.7 10*3/uL (ref 4.0–10.5)
nRBC: 0 % (ref 0.0–0.2)

## 2020-07-24 MED ORDER — METHADONE HCL 10 MG PO TABS: 10.0000 mg | ORAL_TABLET | Freq: Two times a day (BID) | ORAL | 0 refills | Status: DC

## 2020-07-24 MED FILL — METHADONE HCL 10 MG TABLET: 10 | 30 days supply | Qty: 60 | Fill #0

## 2020-07-24 NOTE — Progress Notes (Signed)
Hematology and Oncology Follow Up Visit  Teresa Morgan [id number] [birth year] 64 y.o. 07/24/2020   Principle Diagnosis:  1. Stage IIIB (T2 N2 M0) adenocarcinoma of the cecum. 2. Neuropathy secondary to chemotherapy.  Current Therapy:    Observation     Interim History:  Ms.  Morgan is back for followup.  Her main problem continues to be the smoking.  I just wish she would stop smoking.  She has a cough.  This is a chronic cough.  Is relatively nonproductive.  She does have the neuropathy.  This is caused issues for her.  I am going to get her off the oxycodone.  I will try her on methadone and we will see if this might help with the neuropathy also, in addition to the gabapentin.  She has had no problems with bowels or bladder.  She has had no problems with fever.  She is avoided the coronavirus.  Her last CEA was up to 7.  We did do a CT scan of her abdomen pelvis in October 2020.  There were some sigmoid diverticulosis but nothing else was noted that was suspicious for recurrence.  Again I think the elevated CEA might be from her smoking.  A subsequent CT of the chest was done which was also unremarkable.  Currently, her performance status is ECOG 1  Medications:  Current Outpatient Medications:  .  albuterol (PROAIR HFA) 108 (90 Base) MCG/ACT inhaler, Inhale 2 puffs into the lungs every 6 (six) hours as needed for wheezing or shortness of breath., Disp: 6.7 g, Rfl: 1 .  ALPRAZolam (XANAX) 1 MG tablet, TAKE 1 TABLET BY MOUTH EVERY NIGHT AT BEDTIME AS NEEDED FOR ANXIETY OR SLEEP, Disp: 30 tablet, Rfl: 2 .  Calcium-Vitamin D 600-125 MG-UNIT TABS, Take 1 tablet by mouth daily. , Disp: , Rfl:  .  famotidine (PEPCID) 20 MG tablet, Take 40 mg by mouth 2 (two) times daily., Disp: , Rfl:  .  fluticasone furoate-vilanterol (BREO ELLIPTA) 100-25 MCG/INH AEPB, INHALE 1 PUFF BY MOUTH INTO THE LUNGS ONCE DAILY, Disp: 60 each, Rfl: 6 .  Fluticasone-Salmeterol (ADVAIR) 250-50 MCG/DOSE AEPB, Inhale 1  puff into the lungs every 12 (twelve) hours., Disp: 180 each, Rfl: 3 .  gabapentin (NEURONTIN) 600 MG tablet, TAKE 1 TABLET (600 MG TOTAL) BY MOUTH 4 (FOUR) TIMES DAILY., Disp: 120 tablet, Rfl: 3 .  hydrOXYzine (ATARAX/VISTARIL) 25 MG tablet, Take 25 mg by mouth every 8 (eight) hours., Disp: , Rfl:  .  Multiple Vitamin (MULTIVITAMIN) tablet, Take 1 tablet by mouth daily.  , Disp: , Rfl:  .  oxyCODONE-acetaminophen (PERCOCET) 10-325 MG tablet, Take 1-2 tablets by mouth every 4 (four) hours as needed. for pain, Disp: 120 tablet, Rfl: 0 .  Pyridoxine HCl (VITAMIN B-6) 250 MG tablet, Take 1 tablet (250 mg total) by mouth daily., Disp: 30 tablet, Rfl: 12 .  TURMERIC PO, Take 1 tablet by mouth daily. , Disp: , Rfl:  .  vitamin E 200 UNIT capsule, Take 200 Units by mouth daily.  , Disp: , Rfl:  .  vortioxetine HBr (TRINTELLIX) 5 MG TABS tablet, Take 1 tablet (5 mg total) by mouth daily., Disp: 90 tablet, Rfl: 1  Allergies:  Allergies  Allergen Reactions  . Aspirin     Upset stomach  . Cymbalta [Duloxetine Hcl] Swelling    Swollen lips   . Prozac [Fluoxetine Hcl] Swelling    Lip swelling  . Wellbutrin [Bupropion] Swelling    Lip swelling  Past Medical History, Surgical history, Social history, and Family History were reviewed and updated.  Review of Systems: Review of Systems  Constitutional: Negative.   HENT: Negative.   Eyes: Negative.   Respiratory: Negative.   Cardiovascular: Negative.   Gastrointestinal: Positive for nausea.  Genitourinary: Negative.   Musculoskeletal: Negative.   Skin: Negative.   Neurological: Positive for tingling.  Endo/Heme/Allergies: Negative.   Psychiatric/Behavioral: Negative.     Physical Exam:  weight is 151 lb (68.5 kg). Her oral temperature is 98.4 F (36.9 C). Her blood pressure is 135/75 and her pulse is 101 (abnormal). Her respiration is 17 and oxygen saturation is 97%.   Physical Exam Vitals reviewed.  HENT:     Head: Normocephalic and  atraumatic.  Eyes:     Pupils: Pupils are equal, round, and reactive to light.  Cardiovascular:     Rate and Rhythm: Normal rate and regular rhythm.     Heart sounds: Normal heart sounds.  Pulmonary:     Effort: Pulmonary effort is normal.     Breath sounds: Normal breath sounds.  Abdominal:     General: Bowel sounds are normal.     Palpations: Abdomen is soft.  Musculoskeletal:        General: No tenderness or deformity. Normal range of motion.     Cervical back: Normal range of motion.  Lymphadenopathy:     Cervical: No cervical adenopathy.  Skin:    General: Skin is warm and dry.     Findings: No erythema or rash.  Neurological:     Mental Status: She is alert and oriented to person, place, and time.  Psychiatric:        Behavior: Behavior normal.        Thought Content: Thought content normal.        Judgment: Judgment normal.      Lab Results  Component Value Date   WBC 9.7 07/24/2020   HGB 14.4 07/24/2020   HCT 45.0 07/24/2020   MCV 73.8 (L) 07/24/2020   PLT 159 07/24/2020     Chemistry      Component Value Date/Time   NA 140 07/24/2020 1121   NA 141 07/24/2017 1147   NA 142 09/21/2015 1306   K 3.7 07/24/2020 1121   K 3.3 07/24/2017 1147   K 3.8 09/21/2015 1306   CL 104 07/24/2020 1121   CL 103 07/24/2017 1147   CO2 29 07/24/2020 1121   CO2 29 07/24/2017 1147   CO2 27 09/21/2015 1306   BUN 10 07/24/2020 1121   BUN 8 07/24/2017 1147   BUN 9.8 09/21/2015 1306   CREATININE 0.91 07/24/2020 1121   CREATININE 0.9 07/24/2017 1147   CREATININE 0.8 09/21/2015 1306      Component Value Date/Time   CALCIUM 9.4 07/24/2020 1121   CALCIUM 9.0 07/24/2017 1147   CALCIUM 9.2 09/21/2015 1306   ALKPHOS 75 07/24/2020 1121   ALKPHOS 81 07/24/2017 1147   ALKPHOS 77 09/21/2015 1306   AST 13 (L) 07/24/2020 1121   AST 15 09/21/2015 1306   ALT 13 07/24/2020 1121   ALT 20 07/24/2017 1147   ALT 13 09/21/2015 1306   BILITOT 0.4 07/24/2020 1121   BILITOT 0.40  09/21/2015 1306      Impression and Plan: Teresa Morgan is 64 year-old female with stage IIIB colon cancer. This was of the cecum. She underwent resection back in September of 2012. She had 4 positive lymph nodes. She completed chemotherapy in March of 2013.  I doubt that colon cancer recurrence will ever be a problem for her.  With her smoking, one has to worry about the possibility of lung cancer.  We will now get her back yearly.  I think this would be reasonable.  Hopefully, the methadone will help with this neuropathy.  I just do not want her to take all of the Tylenol with the oxycodone.  I know her liver is doing okay right now but she definitely would be at risk for Tylenol hepatotoxicity.  We will still follow her up yearly.  Volanda Napoleon, MD 9/10/202112:18 PM

## 2020-07-26 ENCOUNTER — Encounter: Payer: Self-pay | Admitting: Hematology & Oncology

## 2020-07-27 LAB — CEA (IN HOUSE-CHCC): CEA (CHCC-In House): 6.98 ng/mL — ABNORMAL HIGH (ref 0.00–5.00)

## 2020-07-28 ENCOUNTER — Encounter: Payer: Self-pay | Admitting: *Deleted

## 2020-07-28 ENCOUNTER — Other Ambulatory Visit: Payer: Self-pay | Admitting: *Deleted

## 2020-08-04 ENCOUNTER — Encounter: Payer: Self-pay | Admitting: Hematology & Oncology

## 2020-08-05 ENCOUNTER — Other Ambulatory Visit: Payer: Self-pay | Admitting: *Deleted

## 2020-08-05 ENCOUNTER — Encounter: Payer: Self-pay | Admitting: *Deleted

## 2020-08-05 DIAGNOSIS — C180 Malignant neoplasm of cecum: Secondary | ICD-10-CM

## 2020-08-05 NOTE — Progress Notes (Signed)
I sent patient a MyChart message regarding Methadone. Per Dr. Marin Olp, break the 10 mg tablets in half. Each half is 5 mg. Take 5 mg by mouth every 12 hours as needed for pain. Instructed patient to call if she had any questions or concerns regarding new directions.

## 2020-08-10 ENCOUNTER — Other Ambulatory Visit: Payer: Self-pay | Admitting: Hematology & Oncology

## 2020-08-10 DIAGNOSIS — G629 Polyneuropathy, unspecified: Secondary | ICD-10-CM

## 2020-08-10 DIAGNOSIS — J430 Unilateral pulmonary emphysema [MacLeod's syndrome]: Secondary | ICD-10-CM

## 2020-08-10 DIAGNOSIS — C180 Malignant neoplasm of cecum: Secondary | ICD-10-CM

## 2020-08-10 MED FILL — GABAPENTIN 600 MG TABLET: 600 | 30 days supply | Qty: 120 | Fill #0

## 2020-08-10 MED FILL — ALPRAZolam 1 MG TABS: 1 | 30 days supply | Qty: 30 | Fill #2

## 2020-08-10 MED FILL — BREO ELLIPTA 100-25 MCG INH: 100-25 | 30 days supply | Qty: 60 | Fill #3

## 2020-08-12 ENCOUNTER — Other Ambulatory Visit: Payer: Self-pay | Admitting: *Deleted

## 2020-08-12 DIAGNOSIS — Z87891 Personal history of nicotine dependence: Secondary | ICD-10-CM

## 2020-08-12 DIAGNOSIS — F17210 Nicotine dependence, cigarettes, uncomplicated: Secondary | ICD-10-CM

## 2020-08-21 ENCOUNTER — Other Ambulatory Visit: Payer: Self-pay | Admitting: Hematology & Oncology

## 2020-08-21 MED ORDER — OXYCODONE HCL 5 MG PO TABS: 5.0000 mg | ORAL_TABLET | Freq: Four times a day (QID) | ORAL | 0 refills | Status: DC | PRN

## 2020-08-24 ENCOUNTER — Other Ambulatory Visit: Payer: Self-pay | Admitting: *Deleted

## 2020-08-24 DIAGNOSIS — C180 Malignant neoplasm of cecum: Secondary | ICD-10-CM

## 2020-08-24 MED FILL — FLUAD QUADRIVALENT 0.5 ML P: 0.5 | 1 days supply | Qty: 1 | Fill #0

## 2020-08-24 MED FILL — oxyCODONE HCL 5 MG TABS: 5 | 15 days supply | Qty: 60 | Fill #0

## 2020-08-31 ENCOUNTER — Encounter: Payer: Medicare Other | Admitting: Acute Care

## 2020-08-31 ENCOUNTER — Inpatient Hospital Stay: Admission: RE | Admit: 2020-08-31 | Payer: Medicare Other | Source: Ambulatory Visit

## 2020-08-31 ENCOUNTER — Telehealth: Payer: Self-pay | Admitting: Acute Care

## 2020-08-31 ENCOUNTER — Encounter: Payer: Self-pay | Admitting: Family

## 2020-08-31 NOTE — Telephone Encounter (Signed)
Spoke with patient to get a little more information and she states she is not having any dizziness or lightheadedness today just pain to right side of her body due to fall from Thursday night, patient states she had some lightheadedness the night of the fall but blacked out when she fell and does not remember anything except when she hit the floor. She feels like she still has pain around neck, right hip and bruise where she hit her head, but area it is not raised just sore. Please advise.   Thanks

## 2020-08-31 NOTE — Telephone Encounter (Signed)
Spoke with pt and rescheduled [facility] 09/14/20 2:30 CT will rescheduled Nothing further needed

## 2020-09-02 ENCOUNTER — Ambulatory Visit (INDEPENDENT_AMBULATORY_CARE_PROVIDER_SITE_OTHER): Payer: Medicare Other | Admitting: Family

## 2020-09-02 ENCOUNTER — Encounter: Payer: Self-pay | Admitting: Family

## 2020-09-02 ENCOUNTER — Other Ambulatory Visit: Payer: Self-pay

## 2020-09-02 VITALS — BP 127/88 | HR 93 | Temp 98.2°F | Resp 16 | Ht 68.0 in | Wt 152.0 lb

## 2020-09-02 DIAGNOSIS — S060X1A Concussion with loss of consciousness of 30 minutes or less, initial encounter: Secondary | ICD-10-CM

## 2020-09-02 DIAGNOSIS — S0990XA Unspecified injury of head, initial encounter: Secondary | ICD-10-CM | POA: Diagnosis not present

## 2020-09-02 DIAGNOSIS — R55 Syncope and collapse: Secondary | ICD-10-CM | POA: Diagnosis not present

## 2020-09-02 NOTE — Patient Instructions (Signed)
Please complete CT on the first floor. You should be contacted about scheduling your carotid and heart ultrasound tests. Please stand slowly. Go to ER if you have recurrent loss of consciousness, severe/worsening headache.   Concussion, Adult  A concussion is a brain injury from a hard, direct hit (trauma) to the head or body. This direct hit causes the brain to shake quickly back and forth inside the skull. This can damage brain cells and cause chemical changes in the brain. A concussion may also be known as a mild traumatic brain injury (TBI). Concussions are usually not life-threatening, but the effects of a concussion can be serious. If you have a concussion, you should be very careful to avoid having a second concussion. What are the causes? This condition is caused by:  A direct hit to your head, such as: ? Running into another player during a game. ? Being hit in a fight. ? Hitting your head on a hard surface.  Sudden movement of your body that causes your brain to move back and forth inside the skull, such as in a car crash. What are the signs or symptoms? The signs of a concussion can be hard to notice. Early on, they may be missed by you, family members, and health care providers. You may look fine on the outside but may act or feel differently. Symptoms are usually temporary and most often improve in 7-10 days. Some symptoms appear right away, but other symptoms may not show up for hours or days. If your symptoms last longer than normal, you may have post-concussion syndrome. Every head injury is different. Physical symptoms  Headaches. This can include a feeling of pressure in the head or migraine-like symptoms.  Tiredness (fatigue).  Dizziness.  Problems with coordination or balance.  Vision or hearing problems.  Sensitivity to light or noise.  Nausea or vomiting.  Changes in eating or sleeping patterns.  Numbness or tingling.  Seizure. Mental and emotional  symptoms  Memory problems.  Trouble concentrating, organizing, or making decisions.  Slowness in thinking, acting or reacting, speaking, or reading.  Irritability or mood changes.  Anxiety or depression. How is this diagnosed? This condition is diagnosed based on:  Your symptoms.  A description of your injury. You may also have tests, including:  Imaging tests, such as a CT scan or MRI.  Neuropsychological tests. These measure your thinking, understanding, learning, and remembering abilities. How is this treated? Treatment for this condition includes:  Stopping sports or activity if you are injured. If you hit your head or show signs of concussion: ? Do not return to sports or activities the same day. ? Get checked by a health care provider before you return to your activities.  Physical and mental rest and careful observation, usually at home. Gradually return to your normal activities.  Medicines to help with symptoms such as headaches, nausea, or difficulty sleeping. ? Avoid taking opioid pain medicine while recovering from a concussion.  Avoiding alcohol and drugs. These may slow your recovery and can put you at risk of further injury.  Referral to a concussion clinic or rehabilitation center. Recovery from a concussion can take time. How fast you recover depends on many factors. Return to activities only when:  Your symptoms are completely gone.  Your health care provider says that it is safe. Follow these instructions at home: Activity  Limit activities that require a lot of thought or concentration, such as: ? Doing homework or job-related work. ? Watching TV. ?  Working on the computer or phone. ? Playing memory games and puzzles.  Rest. Rest helps your brain heal. Make sure you: ? Get plenty of sleep. Most adults should get 7-9 hours of sleep each night. ? Rest during the day. Take naps or rest breaks when you feel tired.  Avoid physical activity like  exercise until your health care provider says it is safe. Stop any activity that worsens symptoms.  Do not do high-risk activities that could cause a second concussion, such as riding a bike or playing sports.  Ask your health care provider when you can return to your normal activities, such as school, work, athletics, and driving. Your ability to react may be slower after a brain injury. Never do these activities if you are dizzy. Your health care provider will likely give you a plan for gradually returning to activities. General instructions   Take over-the-counter and prescription medicines only as told by your health care provider. Some medicines, such as blood thinners (anticoagulants) and aspirin, may increase the risk for complications, such as bleeding.  Do not drink alcohol until your health care provider says you can.  Watch your symptoms and tell others around you to do the same. Complications sometimes occur after a concussion. Older adults with a brain injury may have a higher risk of serious complications.  Tell your work Freight forwarder, teachers, Government social research officer, school counselor, coach, or Product/process development scientist about your injury, symptoms, and restrictions.  Keep all follow-up visits as told by your health care provider. This is important. How is this prevented? Avoiding another brain injury is very important. In rare cases, another injury can lead to permanent brain damage, brain swelling, or death. The risk of this is greatest during the first 7-10 days after a head injury. Avoid injuries by:  Stopping activities that could lead to a second concussion, such as contact or recreational sports, until your health care provider says it is okay.  Taking these actions once you have returned to sports or activities: ? Avoiding plays or moves that can cause you to crash into another person. This is how most concussions occur. ? Following the rules and being respectful of other players. Do not  engage in violent or illegal plays.  Getting regular exercise that includes strength and balance training.  Wearing a properly fitting helmet during sports, biking, or other activities. Helmets can help protect you from serious skull and brain injuries, but they do not protect you from a concussion. Even when wearing a helmet, you should avoid being hit in the head. Contact a health care provider if:  Your symptoms get worse or they do not improve.  You have new symptoms.  You have another injury. Get help right away if:  You have severe or worsening headaches.  You have weakness or numbness in any part of your body.  You are confused.  Your coordination gets worse.  You vomit repeatedly.  You are sleepier than normal.  Your speech is slurred.  You cannot recognize people or places.  You have a seizure.  It is difficult to wake you up.  You have unusual behavior changes.  You have changes in your vision.  You lose consciousness. Summary  A concussion is a brain injury that results from a hard, direct hit (trauma) to your head or body.  You may have imaging tests and neuropsychological tests to diagnose a concussion.  Treatment for this condition includes physical and mental rest and careful observation.  Ask your  health care provider when you can return to your normal activities, such as school, work, athletics, and driving.  Get help right away if you have a severe headache, weakness on one side of the body, seizures, behavior changes, changes in vision, or if you are confused or sleepier than normal. This information is not intended to replace advice given to you by your health care provider. Make sure you discuss any questions you have with your health care provider. Document Revised: 06/21/2018 Document Reviewed: 06/21/2018 Elsevier Patient Education  [employer].

## 2020-09-02 NOTE — Progress Notes (Signed)
Subjective:    Patient ID: Teresa Morgan, female    DOB: [birth year], 64 y.o.   MRN: [id number]  HPI  Patient is a 64 yr old female who presents today with report of fall on 10/14.  Patient reports that she got up from bed to walk to the dining room. She does not remember the short walk from the bed to the dining room.  Husband heard a noise and ran down stairs. He found the patient awake and laying on the floor. She believes she had a momentary loc.  Husband did not see any seizure activity. Reports that she hit the right side of her head on the bar stool. Had some associated neck pain which is improving. Denies associated nausea or vomiting.  + HA is intermittent.  Improves with tylenol.  Denies changes in her vision.  Did not have incontinence of bowel or bladder during hte fall.    Review of Systems See HPI  Past Medical History:  Diagnosis Date  . Anemia   . Anxiety   . Arthritis   . Cataract    bilat removed  . Colon cancer [facility]) 2013   cecal cancer  . COPD with emphysema ([place]) 06/18/2012  . Depression   . GERD (gastroesophageal reflux disease)   . Neuromuscular disorder ([place])    neuropathy from chemo  . Peptic ulcer   . Pneumonia   . Shortness of breath dyspnea    with exertion     Social History   Socioeconomic History  . Marital status: Married    Spouse name: Not on file  . Number of children: 3  . Years of education: Not on file  . Highest education level: Not on file  Occupational History  . Occupation: Armed forces operational officer: [employer]  Tobacco Use  . Smoking status: Current Every Day Smoker    Packs/day: 1.00    Years: 37.00    Pack years: 37.00    Types: Cigarettes    Start date: 04/23/1986    Last attempt to quit: 12/25/2017    Years since quitting: 2.6  . Smokeless tobacco: Never Used  Substance and Sexual Activity  . Alcohol use: Yes    Alcohol/week: 0.0 standard drinks    Comment: social  . Drug use: No  . Sexual activity: Not on file  Other  Topics Concern  . 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 Difficulty of Paying Living Expenses: Not on file  Food Insecurity:   . Worried About Charity fundraiser in the Last Year: Not on file  . Ran Out of Food in the Last Year: Not on file  Transportation Needs:   . Lack of Transportation (Medical): Not on file  . Lack of Transportation (Non-Medical): Not on file  Physical Activity:   . Days of Exercise per Week: Not on file  . Minutes of Exercise per Session: Not on file  Stress:   . Feeling of Stress : Not on file  Social Connections:   . Frequency of Communication with Friends and Family: Not on file  . Frequency of Social Gatherings with Friends and Family: Not on file  . Attends Religious Services: Not on file  . Active Member of Clubs or Organizations: Not on  file  . Attends Archivist Meetings: Not on file  . Marital Status: Not on file  Intimate Partner Violence:   . Fear of Current or Ex-Partner: Not on file  . Emotionally Abused: Not on file  . Physically Abused: Not on file  . Sexually Abused: Not on file    Past Surgical History:  Procedure Laterality Date  . APPENDECTOMY  07/07/11  . BREAST EXCISIONAL BIOPSY Right 03/15/2018  . COLON RESECTION  2012   for colon cancer  . COLONOSCOPY    . EYE SURGERY Bilateral    cataract surgery with lens implant  . heel spurs Right   . hemrrhoids    . PLANTAR FASCIA SURGERY Right 2009  . PORT-A-CATH REMOVAL Left 04/19/2016   Procedure: REMOVAL PORT-A-CATH;  Surgeon: [name], MD;  Location: [facility];  Service: General;  Laterality: Left;  . portacath    . RADIOACTIVE SEED GUIDED EXCISIONAL BREAST BIOPSY Right 03/15/2018   Procedure: RADIOACTIVE SEED GUIDED EXCISIONAL BREAST BIOPSY;  Surgeon: [name], MD;  Location: [facility];  Service:  General;  Laterality: Right;  . TUBAL LIGATION  07/1980    Family History  Problem Relation Age of Onset  . Diabetes Mother   . Colon cancer Cousin   . Colon cancer Cousin   . Thalassemia Daughter   . Colon polyps Neg Hx   . Rectal cancer Neg Hx   . Stomach cancer Neg Hx     Allergies  Allergen Reactions  . Aspirin     Upset stomach  . Cymbalta [Duloxetine Hcl] Swelling    Swollen lips   . Prozac [Fluoxetine Hcl] Swelling    Lip swelling  . Wellbutrin [Bupropion] Swelling    Lip swelling    Current Outpatient Medications on File Prior to Visit  Medication Sig Dispense Refill  . albuterol (PROAIR HFA) 108 (90 Base) MCG/ACT inhaler Inhale 2 puffs into the lungs every 6 (six) hours as needed for wheezing or shortness of breath. 6.7 g 1  . ALPRAZolam (XANAX) 1 MG tablet TAKE 1 TABLET BY MOUTH EVERY NIGHT AT BEDTIME AS NEEDED FOR ANXIETY OR SLEEP 30 tablet 2  . Calcium-Vitamin D 600-125 MG-UNIT TABS Take 1 tablet by mouth daily.     . famotidine (PEPCID) 20 MG tablet Take 40 mg by mouth 2 (two) times daily.    Marland Kitchen gabapentin (NEURONTIN) 600 MG tablet TAKE 1 TABLET (600 MG TOTAL) BY MOUTH 4 (FOUR) TIMES DAILY. 120 tablet 3  . hydrOXYzine (ATARAX/VISTARIL) 25 MG tablet Take 25 mg by mouth every 8 (eight) hours.    . Multiple Vitamin (MULTIVITAMIN) tablet Take 1 tablet by mouth daily.      Marland Kitchen oxyCODONE (OXY IR/ROXICODONE) 5 MG immediate release tablet Take 1 tablet (5 mg total) by mouth every 6 (six) hours as needed for severe pain. 60 tablet 0  . Pyridoxine HCl (VITAMIN B-6) 250 MG tablet Take 1 tablet (250 mg total) by mouth daily. 30 tablet 12  . TURMERIC PO Take 1 tablet by mouth daily.     . vitamin E 200 UNIT capsule Take 200 Units by mouth daily.      Marland Kitchen vortioxetine HBr (TRINTELLIX) 5 MG TABS tablet Take 1 tablet (5 mg total) by mouth daily. 90 tablet 1   No current facility-administered medications on file prior to visit.    BP 130/79 (BP Location: Right Arm, Patient  Position: Sitting, Cuff Size: Small)   Pulse 87   Temp 98.2 F (  36.8 C) (Oral)   Resp 16   Ht 5\' 8"  (1.727 m)   Wt 152 lb (68.9 kg)   SpO2 96%   BMI 23.11 kg/m       Objective:   Physical Exam Constitutional:      Appearance: She is well-developed.  Eyes:     Extraocular Movements: Extraocular movements intact.     Pupils: Pupils are equal, round, and reactive to light.  Neck:     Thyroid: No thyromegaly.  Cardiovascular:     Rate and Rhythm: Normal rate and regular rhythm.     Heart sounds: Normal heart sounds. No murmur heard.   Pulmonary:     Effort: Pulmonary effort is normal. No respiratory distress.     Breath sounds: Normal breath sounds. No wheezing.  Musculoskeletal:     Cervical back: Neck supple.  Skin:    General: Skin is warm and dry.  Neurological:     Mental Status: She is alert and oriented to person, place, and time.     Cranial Nerves: Cranial nerves are intact.     Motor: Motor function is intact.     Deep Tendon Reflexes:     Reflex Scores:      Patellar reflexes are 2+ on the right side and 2+ on the left side.    Comments: Bilateral UE/LE strength is 5/5  Psychiatric:        Behavior: Behavior normal.        Thought Content: Thought content normal.        Judgment: Judgment normal.           Assessment & Plan:  Syncope- ? Vasovagal.  Orthostatic checks today are normal.  EKG tracing is personally reviewed.  EKG notes NSR.  No acute changes.  Will refer for carotid US/2D echo.  Concussion- most likely cause for HA, however I would also like for her to complete a CT head to rule out bleed.    Pt is advised as follows:  Please complete CT on the first floor. You should be contacted about scheduling your carotid and heart ultrasound tests. Please stand slowly. Go to ER if you have recurrent loss of consciousness, severe/worsening headache.  This visit occurred during the SARS-CoV-2 public health emergency.  Safety protocols were in  place, including screening questions prior to the visit, additional usage of staff PPE, and extensive cleaning of exam room while observing appropriate contact time as indicated for disinfecting solutions.

## 2020-09-07 ENCOUNTER — Other Ambulatory Visit (HOSPITAL_BASED_OUTPATIENT_CLINIC_OR_DEPARTMENT_OTHER): Payer: Medicare Other

## 2020-09-08 ENCOUNTER — Other Ambulatory Visit: Payer: Self-pay | Admitting: Hematology & Oncology

## 2020-09-09 ENCOUNTER — Encounter: Payer: Self-pay | Admitting: Physical Medicine and Rehabilitation

## 2020-09-09 ENCOUNTER — Telehealth: Payer: Self-pay | Admitting: *Deleted

## 2020-09-09 ENCOUNTER — Ambulatory Visit (HOSPITAL_BASED_OUTPATIENT_CLINIC_OR_DEPARTMENT_OTHER)
Admission: RE | Admit: 2020-09-09 | Discharge: 2020-09-09 | Disposition: A | Discharge status: Home / Self Care | Payer: Medicare Other | Source: Ambulatory Visit | Attending: Family | Admitting: Family

## 2020-09-09 ENCOUNTER — Other Ambulatory Visit: Payer: Self-pay | Admitting: Hematology & Oncology

## 2020-09-09 ENCOUNTER — Other Ambulatory Visit: Payer: Self-pay

## 2020-09-09 DIAGNOSIS — R519 Headache, unspecified: Secondary | ICD-10-CM | POA: Diagnosis not present

## 2020-09-09 DIAGNOSIS — G629 Polyneuropathy, unspecified: Secondary | ICD-10-CM

## 2020-09-09 DIAGNOSIS — S0990XA Unspecified injury of head, initial encounter: Secondary | ICD-10-CM | POA: Insufficient documentation

## 2020-09-09 DIAGNOSIS — R55 Syncope and collapse: Secondary | ICD-10-CM | POA: Diagnosis not present

## 2020-09-09 DIAGNOSIS — C180 Malignant neoplasm of cecum: Secondary | ICD-10-CM

## 2020-09-09 DIAGNOSIS — J430 Unilateral pulmonary emphysema [MacLeod's syndrome]: Secondary | ICD-10-CM

## 2020-09-09 DIAGNOSIS — I6523 Occlusion and stenosis of bilateral carotid arteries: Secondary | ICD-10-CM | POA: Diagnosis not present

## 2020-09-09 MED FILL — BREO ELLIPTA 100-25 MCG INH: 100-25 | 30 days supply | Qty: 60 | Fill #4

## 2020-09-09 MED FILL — GABAPENTIN 600 MG TABLET: 600 | 30 days supply | Qty: 120 | Fill #1

## 2020-09-09 MED FILL — TRINTELLIX 5 MG TABLET: 5 | 90 days supply | Qty: 90 | Fill #1

## 2020-09-09 NOTE — Telephone Encounter (Signed)
Received a call from Teresa Morgan regarding pain management referral.  Was able to reach patient and appt scheduled for 09/23/20

## 2020-09-09 NOTE — Telephone Encounter (Signed)
Called patient to let her know that her Narcotic refill is 10 days early.  Patient stated this is because she fell and hit her head and was taking more than normal.  I asked about the status of the pain management referral and she stated that she hasnt heard from them.  Notes in Mychart state that referral was accepted and attempted contact.  Patient denies any messages.  Reached out via Secure Chat to [employer] to make sure she has correct phone numbers.   No answers yet.

## 2020-09-10 ENCOUNTER — Other Ambulatory Visit: Payer: Self-pay | Admitting: *Deleted

## 2020-09-10 ENCOUNTER — Other Ambulatory Visit: Payer: Self-pay | Admitting: Hematology & Oncology

## 2020-09-10 DIAGNOSIS — G629 Polyneuropathy, unspecified: Secondary | ICD-10-CM

## 2020-09-10 DIAGNOSIS — C180 Malignant neoplasm of cecum: Secondary | ICD-10-CM

## 2020-09-10 DIAGNOSIS — J430 Unilateral pulmonary emphysema [MacLeod's syndrome]: Secondary | ICD-10-CM

## 2020-09-10 MED ORDER — OXYCODONE HCL 5 MG PO TABS: 5.0000 mg | ORAL_TABLET | Freq: Four times a day (QID) | ORAL | 0 refills | Status: DC | PRN

## 2020-09-10 MED ORDER — ALPRAZOLAM 1 MG PO TABS: ORAL_TABLET | ORAL | 2 refills | Status: DC

## 2020-09-10 MED FILL — ALPRAZolam 1 MG TABS: 1 | 30 days supply | Qty: 30 | Fill #0

## 2020-09-10 MED FILL — oxyCODONE HCL 5 MG TABS: 5 | 15 days supply | Qty: 60 | Fill #0

## 2020-09-14 ENCOUNTER — Encounter: Payer: Self-pay | Admitting: Acute Care

## 2020-09-14 ENCOUNTER — Ambulatory Visit (INDEPENDENT_AMBULATORY_CARE_PROVIDER_SITE_OTHER)
Admission: RE | Admit: 2020-09-14 | Discharge: 2020-09-14 | Disposition: A | Discharge status: Home / Self Care | Payer: Medicare Other | Source: Ambulatory Visit | Attending: Acute Care | Admitting: Acute Care

## 2020-09-14 ENCOUNTER — Ambulatory Visit (INDEPENDENT_AMBULATORY_CARE_PROVIDER_SITE_OTHER): Payer: Medicare Other | Admitting: Acute Care

## 2020-09-14 ENCOUNTER — Other Ambulatory Visit: Payer: Self-pay

## 2020-09-14 VITALS — BP 138/70 | HR 100 | Temp 97.6°F | Ht 68.0 in | Wt 153.8 lb

## 2020-09-14 DIAGNOSIS — F17210 Nicotine dependence, cigarettes, uncomplicated: Secondary | ICD-10-CM

## 2020-09-14 DIAGNOSIS — Z122 Encounter for screening for malignant neoplasm of respiratory organs: Secondary | ICD-10-CM

## 2020-09-14 DIAGNOSIS — Z87891 Personal history of nicotine dependence: Secondary | ICD-10-CM

## 2020-09-14 NOTE — Progress Notes (Signed)
Shared Decision Making Visit Lung Cancer Screening Program [phone])   Eligibility:  Age 64 y.o.  Pack Years Smoking History Calculation 45 pack year smoking history (# packs/per year x # years smoked)  Recent History of coughing up blood  no  Unexplained weight loss? no ( >Than 15 pounds within the last 6 months )  Prior History Lung / other cancer no (Diagnosis within the last 5 years already requiring surveillance chest CT Scans).  Smoking Status Current Smoker  Former Smokers: Years since quit: NA  Quit Date: NA  Visit Components:  Discussion included one or more decision making aids. yes  Discussion included risk/benefits of screening. yes  Discussion included potential follow up diagnostic testing for abnormal scans. yes  Discussion included meaning and risk of over diagnosis. yes  Discussion included meaning and risk of False Positives. yes  Discussion included meaning of total radiation exposure. yes  Counseling Included:  Importance of adherence to annual lung cancer LDCT screening. yes  Impact of comorbidities on ability to participate in the program. yes  Ability and willingness to under diagnostic treatment. yes  Smoking Cessation Counseling:  Current Smokers:   Discussed importance of smoking cessation. yes  Information about tobacco cessation classes and interventions provided to patient. yes  Patient provided with "ticket" for LDCT Scan. yes  Symptomatic Patient. no  Counseling  Diagnosis Code: Tobacco Use Z72.0  Asymptomatic Patient yes  Counseling (Intermediate counseling: > three minutes counseling) O9629  Former Smokers:   Discussed the importance of maintaining cigarette abstinence. yes  Diagnosis Code: Personal History of Nicotine Dependence. B28.413  Information about tobacco cessation classes and interventions provided to patient. Yes  Patient provided with "ticket" for LDCT Scan. yes  Written Order for Lung Cancer  Screening with LDCT placed in Epic. Yes (CT Chest Lung Cancer Screening Low Dose W/O CM) [id number] Z12.2-Screening of respiratory organs Z87.891-Personal history of nicotine dependence  BP 138/70   Pulse 100   Temp 97.6 F (36.4 C) (Oral)   Ht 5\' 8"  (1.727 m)   Wt 153 lb 12.8 oz (69.8 kg)   SpO2 94%   BMI 23.39 kg/m    I have spent 25 minutes of face to face time with Ms. Stencel discussing the risks and benefits of lung cancer screening. We viewed a power point together that explained in detail the above noted topics. We paused at intervals to allow for questions to be asked and answered to ensure understanding.We discussed that the single most powerful action that she can take to decrease her risk of developing lung cancer is to quit smoking. We discussed whether or not she is ready to commit to setting a quit date. We discussed options for tools to aid in quitting smoking including nicotine replacement therapy, non-nicotine medications, support groups, Quit Smart classes, and behavior modification. We discussed that often times setting smaller, more achievable goals, such as eliminating 1 cigarette a day for a week and then 2 cigarettes a day for a week can be helpful in slowly decreasing the number of cigarettes smoked. This allows for a sense of accomplishment as well as providing a clinical benefit. I gave her the " Be Stronger Than Your Excuses" card with contact information for community resources, classes, free nicotine replacement therapy, and access to mobile apps, text messaging, and on-line smoking cessation help. I have also given her my card and contact information in the event she needs to contact me. We discussed the time and location of the scan, and  that either Doroteo Glassman RN or I will call with the results within 24-48 hours of receiving them. I have offered her  a copy of the power point we viewed  as a resource in the event they need reinforcement of the concepts we discussed  today in the office. The patient verbalized understanding of all of  the above and had no further questions upon leaving the office. They have my contact information in the event they have any further questions.  I spent 3 minutes counseling on smoking cessation and the health risks of continued tobacco abuse.  I explained to the patient that there has been a high incidence of coronary artery disease noted on these exams. I explained that this is a non-gated exam therefore degree or severity cannot be determined. This patient is not on statin therapy. I have asked the patient to follow-up with their PCP regarding any incidental finding of coronary artery disease and management with diet or medication as their PCP  feels is clinically indicated. The patient verbalized understanding of the above and had no further questions upon completion of the visit.  Pt. Declined appointment with pharmacy to discuss further lung cancer screening     Magdalen Spatz, NP 09/14/2020 2:56 PM

## 2020-09-14 NOTE — Patient Instructions (Signed)
Thank you for participating in the [place] Lung Cancer Screening Program. It was our pleasure to meet you today. We will call you with the results of your scan within the next few days. Your scan will be assigned a Lung RADS category score by the physicians reading the scans.  This Lung RADS score determines follow up scanning.  See below for description of categories, and follow up screening recommendations. We will be in touch to schedule your follow up screening annually or based on recommendations of our providers. We will fax a copy of your scan results to your Primary Care Physician, or the physician who referred you to the program, to ensure they have the results. Please call the office if you have any questions or concerns regarding your scanning experience or results.  Our office number is [phone]. Please speak with Denise Phelps, RN. She is our Lung Cancer Screening RN. If she is unavailable when you call, please have the office staff send her a message. She will return your call at her earliest convenience. Remember, if your scan is normal, we will scan you annually as long as you continue to meet the criteria for the program. (Age 55-77, Current smoker or smoker who has quit within the last 15 years). If you are a smoker, remember, quitting is the single most powerful action that you can take to decrease your risk of lung cancer and other pulmonary, breathing related problems. We know quitting is hard, and we are here to help.  Please let us know if there is anything we can do to help you meet your goal of quitting. If you are a former smoker, congratulations. We are proud of you! Remain smoke free! Remember you can refer friends or family members through the number above.  We will screen them to make sure they meet criteria for the program. Thank you for helping us take better care of you by participating in Lung Screening.  Lung RADS Categories:  Lung RADS 1: no nodules  or definitely non-concerning nodules.  Recommendation is for a repeat annual scan in 12 months.  Lung RADS 2:  nodules that are non-concerning in appearance and behavior with a very low likelihood of becoming an active cancer. Recommendation is for a repeat annual scan in 12 months.  Lung RADS 3: nodules that are probably non-concerning , includes nodules with a low likelihood of becoming an active cancer.  Recommendation is for a 6-month repeat screening scan. Often noted after an upper respiratory illness. We will be in touch to make sure you have no questions, and to schedule your 6-month scan.  Lung RADS 4 A: nodules with concerning findings, recommendation is most often for a follow up scan in 3 months or additional testing based on our provider's assessment of the scan. We will be in touch to make sure you have no questions and to schedule the recommended 3 month follow up scan.  Lung RADS 4 B:  indicates findings that are concerning. We will be in touch with you to schedule additional diagnostic testing based on our provider's  assessment of the scan.   

## 2020-09-17 ENCOUNTER — Other Ambulatory Visit: Payer: Self-pay | Admitting: *Deleted

## 2020-09-17 DIAGNOSIS — F17210 Nicotine dependence, cigarettes, uncomplicated: Secondary | ICD-10-CM

## 2020-09-17 DIAGNOSIS — Z87891 Personal history of nicotine dependence: Secondary | ICD-10-CM

## 2020-09-17 NOTE — Progress Notes (Signed)
I have called the patient with the results of her low dose CT. Her scan was read as a Lung  RADS 3, nodules that are probably benign findings, short term follow up suggested: includes nodules with a low likelihood of becoming a clinically active cancer. Radiology recommends a 6 month repeat LDCT follow up. We discussed that this short interval scan is to ensure the new nodule is stable and does not grow within the 6 month window. She verbalized understanding, and had no further questions or concerns. She understands we will call her prior to the May 2022 due date to get this scheduled.   She is scheduled for an echo this month. She is not on statin therapy. She does have Coronary artery calcifications,which we discussed on this call. She is having the above noted follow up with her PCP.   Denise, Please schedule 6 month follow up, and fax results to PCP. Thanks so much.

## 2020-09-25 ENCOUNTER — Encounter
Payer: Medicare Other | Attending: Physical Medicine and Rehabilitation | Admitting: Physical Medicine and Rehabilitation

## 2020-09-25 ENCOUNTER — Encounter: Payer: Self-pay | Admitting: Physical Medicine and Rehabilitation

## 2020-09-25 ENCOUNTER — Other Ambulatory Visit: Payer: Self-pay

## 2020-09-25 ENCOUNTER — Other Ambulatory Visit: Payer: Self-pay | Admitting: Physical Medicine and Rehabilitation

## 2020-09-25 VITALS — BP 131/77 | HR 90 | Temp 97.8°F | Ht 68.0 in | Wt 153.0 lb

## 2020-09-25 DIAGNOSIS — C180 Malignant neoplasm of cecum: Secondary | ICD-10-CM | POA: Insufficient documentation

## 2020-09-25 DIAGNOSIS — G6289 Other specified polyneuropathies: Secondary | ICD-10-CM | POA: Insufficient documentation

## 2020-09-25 MED ORDER — VARENICLINE TARTRATE 0.5 MG PO TABS: 0.5000 mg | ORAL_TABLET | Freq: Two times a day (BID) | ORAL | 1 refills | Status: DC

## 2020-09-25 MED ORDER — GABAPENTIN 800 MG PO TABS: 800.0000 mg | ORAL_TABLET | Freq: Four times a day (QID) | ORAL | 1 refills | Status: DC | PRN

## 2020-09-25 MED ORDER — LIDOCAINE 5 % EX PTCH: 1.0000 | MEDICATED_PATCH | CUTANEOUS | 1 refills | Status: DC

## 2020-09-25 MED FILL — GABAPENTIN 800 MG TABS: 800 | 30 days supply | Qty: 120 | Fill #0

## 2020-09-25 MED FILL — LIDOCAINE PATCH 5%: 5 | 30 days supply | Qty: 30 | Fill #0

## 2020-09-25 NOTE — Patient Instructions (Signed)
-  Discussed following foods that may reduce pain: 1) Ginger 2) Blueberries 3) Salmon 4) Pumpkin seeds 5) dark chocolate 6) turmeric 7) tart cherries 8) virgin olive oil 9) chilli peppers 10) mint 11) red wine .   

## 2020-09-25 NOTE — Progress Notes (Signed)
Subjective:    Patient ID: Alinda Dooms, female    DOB: 1956/07/04, 64 y.o.   MRN: [id number]  HPI  Mrs. Mckenny is a 64 year old woman who presents to establish care for cancer-related pain. She had stage 3 cancer of the cecum.   She was a patient of Dr. Marin Olp but has been cancer free for 7 years. She had the tumor removed and she underwent chemo. Average pain is 7/10, pain right now is 7/10, pain feels tingling and aching.   Her pain worsened after a recent fall. The pain is located her in her neck and head.   Initially she had a lot of constant pain with the chemo and that is when she was initially started on the Percocet.   She has not experienced tolerance to there medication.   Pain Inventory Average Pain 7 Pain Right Now 7 My pain is tingling and aching  In the last 24 hours, has pain interfered with the following? General activity 7 Relation with others 0 Enjoyment of life 6 What TIME of day is your pain at its worst? morning  and night Sleep (in general) Fair  Pain is worse with: walking, bending, sitting and some activites Pain improves with: medication Relief from Meds: 8  walk without assistance how many minutes can you walk? 40 ability to climb steps?  yes do you drive?  yes  retired  numbness depression anxiety  new  new    Family History  Problem Relation Age of Onset  . Diabetes Mother   . Colon cancer Cousin   . Colon cancer Cousin   . Thalassemia Daughter   . Colon polyps Neg Hx   . Rectal cancer Neg Hx   . Stomach cancer Neg Hx    Social History   Socioeconomic History  . Marital status: Married    Spouse name: Not on file  . Number of children: 3  . Years of education: Not on file  . Highest education level: Not on file  Occupational History  . Occupation: Armed forces operational officer: [employer]  Tobacco Use  . Smoking status: Current Every Day Smoker    Packs/day: 1.00    Years: 45.00    Pack years: 45.00    Types:  Cigarettes    Start date: 04/23/1986    Last attempt to quit: 12/25/2017    Years since quitting: 2.7  . Smokeless tobacco: Never Used  Substance and Sexual Activity  . Alcohol use: Yes    Alcohol/week: 0.0 standard drinks    Comment: social  . Drug use: No  . Sexual activity: Not on file  Other Topics Concern  . 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 Difficulty of Paying Living Expenses: Not on file  Food Insecurity:   . Worried About Charity fundraiser in the Last Year: Not on file  . Ran Out of Food in the Last Year: Not on file  Transportation Needs:   . Lack of Transportation (Medical): Not on file  . Lack of Transportation (Non-Medical): Not on file  Physical Activity:   . Days of Exercise per Week: Not on file  . Minutes of Exercise per Session: Not on file  Stress:   . Feeling of Stress :  Not on file  Social Connections:   . Frequency of Communication with Friends and Family: Not on file  . Frequency of Social Gatherings with Friends and Family: Not on file  . Attends Religious Services: Not on file  . Active Member of Clubs or Organizations: Not on file  . Attends Archivist Meetings: Not on file  . Marital Status: Not on file   Past Surgical History:  Procedure Laterality Date  . APPENDECTOMY  07/07/11  . BREAST EXCISIONAL BIOPSY Right 03/15/2018  . COLON RESECTION  2012   for colon cancer  . COLONOSCOPY    . EYE SURGERY Bilateral    cataract surgery with lens implant  . heel spurs Right   . hemrrhoids    . PLANTAR FASCIA SURGERY Right 2009  . PORT-A-CATH REMOVAL Left 04/19/2016   Procedure: REMOVAL PORT-A-CATH;  Surgeon: [name], MD;  Location: [facility];  Service: General;  Laterality: Left;  . portacath    . RADIOACTIVE SEED GUIDED EXCISIONAL BREAST BIOPSY Right 03/15/2018   Procedure:  RADIOACTIVE SEED GUIDED EXCISIONAL BREAST BIOPSY;  Surgeon: [name], MD;  Location: [place];  Service: General;  Laterality: Right;  . TUBAL LIGATION  07/1980   Past Medical History:  Diagnosis Date  . Anemia   . Anxiety   . Arthritis   . Cataract    bilat removed  . Colon cancer [facility]) 2013   cecal cancer  . COPD with emphysema ([place]) 06/18/2012  . Depression   . GERD (gastroesophageal reflux disease)   . Neuromuscular disorder ([place])    neuropathy from chemo  . Peptic ulcer   . Pneumonia   . Shortness of breath dyspnea    with exertion   BP 131/77   Pulse 90   Temp 97.8 F (36.6 C)   Ht 5\' 8"  (1.727 m)   Wt 153 lb (69.4 kg)   SpO2 94%   BMI 23.26 kg/m   Opioid Risk Score:   Fall Risk Score:  `1  Depression screen PHQ 2/9  Depression screen [facility] 2/9 09/25/2020 05/20/2020 10/22/2018 01/22/2018 07/24/2017 10/31/2016 08/08/2016  Decreased Interest 2 0 0 1 0 0 0  Down, Depressed, Hopeless 1 0 0 1 0 2 1  PHQ - 2 Score 3 0 0 2 0 2 1  Altered sleeping 3 3 3 2 1 3  -  Tired, decreased energy 0 0 0 1 0 0 -  Change in appetite 0 0 0 1 0 0 -  Feeling bad or failure about yourself  0 0 0 0 0 0 -  Trouble concentrating 0 0 0 0 0 0 -  Moving slowly or fidgety/restless 0 0 0 0 0 0 -  Suicidal thoughts 0 0 0 0 0 0 -  PHQ-9 Score 6 3 3 6 1 5  -  Difficult doing work/chores Not difficult at all Not difficult at all - Not difficult at all - - -  Some recent data might be hidden    Review of Systems  Constitutional: Negative.   HENT: Negative.   Eyes: Negative.   Respiratory: Negative.   Cardiovascular: Negative.   Gastrointestinal: Negative.   Endocrine: Negative.   Genitourinary: Negative.   Musculoskeletal: Positive for arthralgias, back pain, myalgias, neck pain and neck stiffness.  Skin: Negative.   Allergic/Immunologic: Negative.   Neurological: Positive for numbness.       Tingling  Hematological: Negative.   Psychiatric/Behavioral: Positive for  dysphoric mood. The patient is nervous/anxious.  All other systems reviewed and are negative.      Objective:   Physical Exam Gen: no distress, normal appearing HEENT: oral mucosa pink and moist, NCAT Cardio: Reg rate Chest: normal effort, normal rate of breathing Abd: soft, non-distended Ext: no edema Skin: intact Neuro: Alert and oriented x3.  Musculoskeletal: Tender to palpation Psych: pleasant, normal affect    Assessment & Plan:  Mrs. George is a 64 year old woman who presents to establish care for for neck and head pain.  1) Chronic Pain Syndrome secondary to cervical myofascial pain -Discussed current symptoms of pain and history of pain.  -Discussed benefits of exercise in reducing pain. -Discussed following foods that may reduce pain: 1) Ginger 2) Blueberries 3) Salmon 4) Pumpkin seeds 5) dark chocolate 6) turmeric 7) tart cherries 8) virgin olive oil 9) chilli peppers 10) mint 11) red wine  2) Cervical myofascial pain syndrome: -Trigger point injections next visit.   3) Smoking cessation:  -Currently smokes 1 pack per day -Prescribed Chantix -Made goal to quit before next visit.

## 2020-09-25 NOTE — Addendum Note (Signed)
Addended by: Izora Ribas on: 09/25/2020 01:44 PM   Modules accepted: Orders

## 2020-09-28 ENCOUNTER — Encounter: Payer: Self-pay | Admitting: Hematology & Oncology

## 2020-09-28 ENCOUNTER — Telehealth: Payer: Self-pay | Admitting: *Deleted

## 2020-09-28 NOTE — Telephone Encounter (Signed)
Received pt mychart message, called pt to discuss, a 2nd opinion referral to pain clinic will need to come from PCP or different provider. Discussed with pt, Dr. Marin Olp cannot prescribe pain meds to pt as she is now under the care of a pain clinic. Pt verbalized understanding, no further concerns at this time.

## 2020-09-30 ENCOUNTER — Ambulatory Visit (HOSPITAL_BASED_OUTPATIENT_CLINIC_OR_DEPARTMENT_OTHER): Payer: Medicare Other

## 2020-10-05 DIAGNOSIS — Z79899 Other long term (current) drug therapy: Secondary | ICD-10-CM | POA: Diagnosis not present

## 2020-10-05 DIAGNOSIS — M545 Low back pain, unspecified: Secondary | ICD-10-CM | POA: Diagnosis not present

## 2020-10-05 DIAGNOSIS — F17200 Nicotine dependence, unspecified, uncomplicated: Secondary | ICD-10-CM | POA: Diagnosis not present

## 2020-10-09 MED FILL — ALPRAZolam 1 MG TABS: 1 | 30 days supply | Qty: 30 | Fill #1

## 2020-10-12 ENCOUNTER — Encounter: Payer: Self-pay | Admitting: Family

## 2020-10-12 MED FILL — BREO ELLIPTA 100-25 MCG INH: 100-25 | 30 days supply | Qty: 60 | Fill #5

## 2020-10-12 NOTE — Telephone Encounter (Signed)
Patient reports she is having soreness under left armpit and hand numbness. She was scheduled to come in tomorrow at 3 pm. Advise if symptoms get worse she needs in person evaluation asap.

## 2020-10-13 ENCOUNTER — Other Ambulatory Visit: Payer: Self-pay

## 2020-10-13 ENCOUNTER — Ambulatory Visit (INDEPENDENT_AMBULATORY_CARE_PROVIDER_SITE_OTHER): Payer: Medicare Other | Admitting: Medical

## 2020-10-13 VITALS — BP 145/73 | HR 86 | Temp 98.6°F | Resp 18 | Ht 68.0 in | Wt 156.0 lb

## 2020-10-13 DIAGNOSIS — G629 Polyneuropathy, unspecified: Secondary | ICD-10-CM | POA: Diagnosis not present

## 2020-10-13 DIAGNOSIS — R0789 Other chest pain: Secondary | ICD-10-CM

## 2020-10-13 DIAGNOSIS — M542 Cervicalgia: Secondary | ICD-10-CM | POA: Diagnosis not present

## 2020-10-13 DIAGNOSIS — F17200 Nicotine dependence, unspecified, uncomplicated: Secondary | ICD-10-CM | POA: Diagnosis not present

## 2020-10-13 NOTE — Progress Notes (Signed)
Subjective:    Patient ID: Teresa Morgan, female    DOB: 01/21/1956, 64 y.o.   MRN: [id number]  HPI  Pt states sunday midnight woke up with headache but then fell asleep. When woke up at 5:30 had not ha. But on Monday  5:30 am some shoulder, chest pain and axillary pain. She took hydrocodone. Pain low level at night. Now resolved.  Yesterday walking did not make symptoms worse.  No jaw pain, no sweating and no shortness of breath.  Pt does have high cholesterol. The 10-year ASCVD risk score Mikey Bussing DC Brooke Bonito., et al., 2013) is: 14.5%   Values used to calculate the score:     Age: 66 years     Sex: Female     Is Non-Hispanic African American: No     Diabetic: No     Tobacco smoker: Yes     Systolic Blood Pressure: 397 mmHg     Is BP treated: No     HDL Cholesterol: 42.4 mg/dL     Total Cholesterol: 208 mg/dL   Pt states smoked for 10 yrs about pack a day.  Pt has some left side neck pain minimal. She has some upper ext hand tingling.  Review of Systems  Constitutional: Negative for chills, fatigue and fever.  Respiratory: Negative for cough, chest tightness, shortness of breath and wheezing.   Cardiovascular: Negative for chest pain and palpitations.       See hpi.  Gastrointestinal: Negative for abdominal pain.  Genitourinary: Negative for dysuria, enuresis and flank pain.  Musculoskeletal: Negative for back pain.  Neurological: Negative for dizziness, seizures, weakness, numbness and headaches.  Psychiatric/Behavioral: Negative for behavioral problems, decreased concentration and dysphoric mood. The patient is not hyperactive.    Past Medical History:  Diagnosis Date   Anemia    Anxiety    Arthritis    Cataract    bilat removed   Colon cancer ([place]) 2013   cecal cancer   COPD with emphysema ([place]) 06/18/2012   Depression    GERD (gastroesophageal reflux disease)    Neuromuscular disorder (HCC)    neuropathy from chemo   Peptic ulcer    Pneumonia    Shortness  of breath dyspnea    with exertion     Social History   Socioeconomic History   Marital status: Married    Spouse name: Not on file   Number of children: 3   Years of education: Not on file   Highest education level: Not on file  Occupational History   Occupation: Armed forces operational officer: [employer]  Tobacco Use   Smoking status: Current Every Day Smoker    Packs/day: 1.00    Years: 45.00    Pack years: 45.00    Types: Cigarettes    Start date: 04/23/1986    Last attempt to quit: 12/25/2017    Years since quitting: 2.8   Smokeless tobacco: Never Used  Substance and Sexual Activity   Alcohol use: Yes    Alcohol/week: 0.0 standard drinks    Comment: social   Drug use: No   Sexual activity: Not on file  Other Topics Concern   Not on file  Social History Narrative   3 children- all daughter- live locally   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, minerals   Married      Investment banker, operational of Radio broadcast assistant Strain:    Difficulty of Paying Living Expenses: Not on file  Food Insecurity:  Worried About Charity fundraiser in the Last Year: Not on file   [employer] of Food in the Last Year: Not on file  Transportation Needs:    Lack of Transportation (Medical): Not on file   Lack of Transportation (Non-Medical): Not on file  Physical Activity:    Days of Exercise per Week: Not on file   Minutes of Exercise per Session: Not on file  Stress:    Feeling of Stress : Not on file  Social Connections:    Frequency of Communication with Friends and Family: Not on file   Frequency of Social Gatherings with Friends and Family: Not on file   Attends Religious Services: Not on file   Active Member of Clubs or Organizations: Not on file   Attends Archivist Meetings: Not on file   Marital Status: Not on file  Intimate Partner Violence:    Fear of Current or Ex-Partner: Not on file   Emotionally Abused: Not on file   Physically Abused: Not on  file   Sexually Abused: Not on file    Past Surgical History:  Procedure Laterality Date   APPENDECTOMY  07/07/11   BREAST EXCISIONAL BIOPSY Right 03/15/2018   COLON RESECTION  2012   for colon cancer   COLONOSCOPY     EYE SURGERY Bilateral    cataract surgery with lens implant   heel spurs Right    hemrrhoids     PLANTAR FASCIA SURGERY Right 2009   PORT-A-CATH REMOVAL Left 04/19/2016   Procedure: REMOVAL PORT-A-CATH;  Surgeon: [name], MD;  Location: [place];  Service: General;  Laterality: Left;   portacath     RADIOACTIVE SEED GUIDED EXCISIONAL BREAST BIOPSY Right 03/15/2018   Procedure: RADIOACTIVE SEED GUIDED EXCISIONAL BREAST BIOPSY;  Surgeon: [name], MD;  Location: [place];  Service: General;  Laterality: Right;   TUBAL LIGATION  07/1980    Family History  Problem Relation Age of Onset   Diabetes Mother    Colon cancer Cousin    Colon cancer Cousin    Thalassemia Daughter    Colon polyps Neg Hx    Rectal cancer Neg Hx    Stomach cancer Neg Hx     Allergies  Allergen Reactions   Aspirin     Upset stomach   Cymbalta [Duloxetine Hcl] Swelling    Swollen lips    Prozac [Fluoxetine Hcl] Swelling    Lip swelling   Wellbutrin [Bupropion] Swelling    Lip swelling    Current Outpatient Medications on File Prior to Visit  Medication Sig Dispense Refill   ALPRAZolam (XANAX) 1 MG tablet TAKE 1 TABLET BY MOUTH EVERY NIGHT AT BEDTIME AS NEEDED FOR ANXIETY OR SLEEP 30 tablet 2   Calcium-Vitamin D 600-125 MG-UNIT TABS Take 1 tablet by mouth daily.      famotidine (PEPCID) 20 MG tablet Take 40 mg by mouth 2 (two) times daily.     fluticasone furoate-vilanterol (BREO ELLIPTA) 100-25 MCG/INH AEPB Inhale 1 puff into the lungs daily.     gabapentin (NEURONTIN) 800 MG tablet Take 1 tablet (800 mg total) by mouth 4 (four) times daily as needed. 120 tablet 1   lidocaine (LIDODERM) 5 % Place 1 patch onto the skin daily. Remove &  Discard patch within 12 hours or as directed by MD 30 patch 1   Multiple Vitamin (MULTIVITAMIN) tablet Take 1 tablet by mouth daily.       Pyridoxine HCl (VITAMIN B-6) 250  MG tablet Take 1 tablet (250 mg total) by mouth daily. 30 tablet 12   TURMERIC PO Take 1 tablet by mouth daily.      vitamin E 200 UNIT capsule Take 200 Units by mouth daily.       vortioxetine HBr (TRINTELLIX) 5 MG TABS tablet Take 1 tablet (5 mg total) by mouth daily. 90 tablet 1   hydrOXYzine (ATARAX/VISTARIL) 25 MG tablet Take 25 mg by mouth every 8 (eight) hours. (Patient not taking: Reported on 10/13/2020)     oxyCODONE (OXY IR/ROXICODONE) 5 MG immediate release tablet Take 1 tablet (5 mg total) by mouth every 6 (six) hours as needed for severe pain. (Patient not taking: Reported on 10/13/2020) 60 tablet 0   varenicline (CHANTIX) 0.5 MG tablet Take 1 tablet (0.5 mg total) by mouth 2 (two) times daily. (Patient not taking: Reported on 10/13/2020) 60 tablet 1   No current facility-administered medications on file prior to visit.    BP (!) 145/73    Pulse 86    Temp 98.6 F (37 C) (Oral)    Resp 18    Ht 5\' 8"  (1.727 m)    Wt 156 lb (70.8 kg)    SpO2 98%    BMI 23.72 kg/m       Objective:   Physical Exam  General Mental Status- Alert. General Appearance- Not in acute distress.   Skin General: Color- Normal Color. Moisture- Normal Moisture.  Neck Carotid Arteries- Normal color. Moisture- Normal Moisture. No carotid bruits. No JVD.  Chest and Lung Exam Auscultation: Breath Sounds:-Normal.  Cardiovascular Auscultation:Rythm- Regular. Murmurs & Other Heart Sounds:Auscultation of the heart reveals- No Murmurs.  Abdomen Inspection:-Inspeection Normal. Palpation/Percussion:Note:No mass. Palpation and Percussion of the abdomen reveal- Non Tender, Non Distended + BS, no rebound or guarding.  Neurologic Cranial Nerve exam:- CN III-XII intact(No nystagmus), symmetric smile. Strength:- 5/5 equal and  symmetric strength both upper and lower extremities.  You have some recent    Assessment & Plan:  Atypical low-level chest pain recurred Monday morning and lasted until this morning.  Some associated neck pain, shoulder pain and axillary area pain.  We did EKG today in the office.  Since no pain presently during exam decided to do 1 set of troponin stat.  We will also go ahead and refer you to cardiology since your cardiovascular risk score is 14%.  Important to note that if you do have recurrent chest pain then be seen in the emergency department.  Neck pain recently with some bilateral hand tingling sensation.  We will get cervical spine x-ray.  Tingling sensation in hands might be related to degenerative changes from cervical spine or possible carpal tunnel syndrome type symptoms.  History of smoking and would recommend smoking cessation.  Consider medication Wellbutrin.  Mild elevated blood pressure today.  Asked that you check your blood pressure at home when you are relaxed and send me a MyChart update on reading.  Follow-up in 7 to 10 days or as needed.  Mackie Pai, PA-C   Time spent with patient today was 40  minutes which consisted of chart review, discussing diagnosis, work up, treatment and documentation.

## 2020-10-13 NOTE — Patient Instructions (Addendum)
Atypical low-level chest pain recurred Monday morning and lasted until this morning.  Some associated neck pain, shoulder pain and axillary area pain.  We did EKG today in the office.  Since no pain presently during exam decided to do 1 set of troponin stat.  We will also go ahead and refer you to cardiology since your cardiovascular risk score is 14%.  Important to note that if you do have recurrent chest pain then be seen in the emergency department.  Neck pain recently with some bilateral hand tingling sensation.  We will get cervical spine x-ray.  Tingling sensation in hands might be related to degenerative changes from cervical spine or possible carpal tunnel syndrome type symptoms.  History of smoking and would recommend smoking cessation.  Consider medication Wellbutrin.  Mild elevated blood pressure today.  Asked that you check your blood pressure at home when you are relaxed and send me a MyChart update on reading.  Follow-up in 7 to 10 days or as needed.

## 2020-10-14 ENCOUNTER — Ambulatory Visit (HOSPITAL_BASED_OUTPATIENT_CLINIC_OR_DEPARTMENT_OTHER)
Admission: RE | Admit: 2020-10-14 | Discharge: 2020-10-14 | Disposition: A | Discharge status: Home / Self Care | Payer: Medicare Other | Source: Ambulatory Visit | Attending: Medical | Admitting: Medical

## 2020-10-14 ENCOUNTER — Telehealth: Payer: Self-pay

## 2020-10-14 ENCOUNTER — Other Ambulatory Visit (INDEPENDENT_AMBULATORY_CARE_PROVIDER_SITE_OTHER): Payer: Medicare Other

## 2020-10-14 DIAGNOSIS — M542 Cervicalgia: Secondary | ICD-10-CM

## 2020-10-14 DIAGNOSIS — R0789 Other chest pain: Secondary | ICD-10-CM | POA: Diagnosis not present

## 2020-10-14 LAB — TROPONIN I (HIGH SENSITIVITY): High Sens Troponin I: 5 ng/L (ref 2–17)

## 2020-10-14 NOTE — Telephone Encounter (Signed)
Teresa Morgan -- it looks like pt is coming in at 1:30 today. Proper course of action has been discussed if situation arises in the future. I have reached out to pt to see if she could come in earlier for STAT testing and left message for her to return my call.

## 2020-10-14 NOTE — Telephone Encounter (Signed)
Ok. Thanks!

## 2020-10-14 NOTE — Telephone Encounter (Signed)
Troponin needs to be done stat the day I put the order in. I need to be notified if future patient does not want it done. I would have advised her to be seen in ED.  Very important for future patients if situation reoccurs.   Did she get lab work done today. Is she coming in/already came in? Did she already get drawn this morning.

## 2020-10-14 NOTE — Telephone Encounter (Signed)
Patient was in lab 10/13/20 due to the time she came in after 4 pm and needed a stat Troponin I informed her that it would have to [facility] and she wanted to come in today due to her insurance would not cover her labs if [facility].

## 2020-10-23 ENCOUNTER — Encounter: Payer: Self-pay | Admitting: General Practice

## 2020-10-28 ENCOUNTER — Telehealth: Payer: Self-pay | Admitting: Family

## 2020-10-28 NOTE — Telephone Encounter (Signed)
Please contact pt and let her know that I noticed she has not completed the echo to check her heart. Please give her # to call to schedule.

## 2020-10-28 NOTE — Telephone Encounter (Signed)
Lvm and MyChart message sent to patient with this information

## 2020-11-03 DIAGNOSIS — Z79899 Other long term (current) drug therapy: Secondary | ICD-10-CM | POA: Diagnosis not present

## 2020-11-03 DIAGNOSIS — F17200 Nicotine dependence, unspecified, uncomplicated: Secondary | ICD-10-CM | POA: Diagnosis not present

## 2020-11-03 DIAGNOSIS — M545 Low back pain, unspecified: Secondary | ICD-10-CM | POA: Diagnosis not present

## 2020-11-10 MED FILL — GABAPENTIN 800 MG TABS: 800 | 30 days supply | Qty: 120 | Fill #1

## 2020-11-10 MED FILL — ALPRAZolam 1 MG TABS: 1 | 30 days supply | Qty: 30 | Fill #2

## 2020-11-10 MED FILL — BREO ELLIPTA 100-25 MCG INH: 100-25 | 30 days supply | Qty: 60 | Fill #6

## 2020-11-16 ENCOUNTER — Encounter: Payer: Medicare Other | Admitting: Physical Medicine and Rehabilitation

## 2020-11-24 ENCOUNTER — Ambulatory Visit (INDEPENDENT_AMBULATORY_CARE_PROVIDER_SITE_OTHER): Payer: Medicare Other | Admitting: Family

## 2020-11-24 ENCOUNTER — Other Ambulatory Visit: Payer: Self-pay

## 2020-11-24 VITALS — BP 138/73 | HR 92 | Temp 98.5°F | Resp 16 | Ht 68.0 in | Wt 157.0 lb

## 2020-11-24 DIAGNOSIS — C180 Malignant neoplasm of cecum: Secondary | ICD-10-CM | POA: Diagnosis not present

## 2020-11-24 DIAGNOSIS — F419 Anxiety disorder, unspecified: Secondary | ICD-10-CM | POA: Diagnosis not present

## 2020-11-24 DIAGNOSIS — F32A Depression, unspecified: Secondary | ICD-10-CM | POA: Diagnosis not present

## 2020-11-24 DIAGNOSIS — Z720 Tobacco use: Secondary | ICD-10-CM | POA: Diagnosis not present

## 2020-11-24 NOTE — Patient Instructions (Addendum)
[place]- [phone]. Please call for counseling.   Please call Cardiology to schedule an appointment. [phone]

## 2020-11-24 NOTE — Progress Notes (Incomplete)
Subjective:    Patient ID: Teresa Morgan, female    DOB: 22-Jan-1956, 65 y.o.   MRN: [id number]  HPI  Patient is a 65 yr old female who presents today for follow up.   Anxiety/Depression- Has trouble sleeping through the whole night.  She reports that she uses melatonin to help her fall back to sleep.    Hx cecal CA-  Tobacco abuse-     Review of Systems    see HPI  Past Medical History:  Diagnosis Date  . Anemia   . Anxiety   . Arthritis   . Cataract    bilat removed  . Colon cancer [facility]) 2013   cecal cancer  . COPD with emphysema ([facility]) 06/18/2012  . Depression   . GERD (gastroesophageal reflux disease)   . Neuromuscular disorder ([facility])    neuropathy from chemo  . Peptic ulcer   . Pneumonia   . Shortness of breath dyspnea    with exertion     Social History   Socioeconomic History  . Marital status: Married    Spouse name: Not on file  . Number of children: 3  . Years of education: Not on file  . Highest education level: Not on file  Occupational History  . Occupation: Armed forces operational officer: [employer]  Tobacco Use  . Smoking status: Current Every Day Smoker    Packs/day: 1.00    Years: 45.00    Pack years: 45.00    Types: Cigarettes    Start date: 04/23/1986    Last attempt to quit: 12/25/2017    Years since quitting: 2.9  . Smokeless tobacco: Never Used  Substance and Sexual Activity  . Alcohol use: Yes    Alcohol/week: 0.0 standard drinks    Comment: social  . Drug use: No  . Sexual activity: Not on file  Other Topics Concern  . 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 APPENDECTOMY  07/07/11  . BREAST EXCISIONAL BIOPSY Right 03/15/2018  . COLON RESECTION  2012   for colon cancer  . COLONOSCOPY    . EYE SURGERY Bilateral    cataract surgery with lens implant  . heel spurs Right   . hemrrhoids    . PLANTAR FASCIA SURGERY Right 2009  . PORT-A-CATH REMOVAL Left 04/19/2016   Procedure: REMOVAL PORT-A-CATH;  Surgeon: [name], MD;  Location: [place];  Service: General;  Laterality: Left;  . portacath    . RADIOACTIVE SEED GUIDED EXCISIONAL BREAST BIOPSY Right 03/15/2018   Procedure: RADIOACTIVE SEED GUIDED EXCISIONAL BREAST BIOPSY;  Surgeon: [name], MD;  Location: [place];  Service: General;  Laterality: Right;  . TUBAL LIGATION  07/1980    Family History  Problem Relation Age of Onset  . Diabetes Mother   . Colon cancer Cousin   . Colon cancer Cousin   . Thalassemia Daughter   . Colon polyps Neg Hx   . Rectal cancer Neg Hx   . Stomach  cancer Neg Hx     Allergies  Allergen Reactions  . Aspirin     Upset stomach  . Cymbalta [Duloxetine Hcl] Swelling    Swollen lips   . Prozac [Fluoxetine Hcl] Swelling    Lip swelling  . Wellbutrin [Bupropion] Swelling    Lip swelling    Current Outpatient Medications on File Prior to Visit  Medication Sig Dispense Refill  . ALPRAZolam (XANAX) 1 MG tablet TAKE 1 TABLET BY MOUTH EVERY NIGHT AT BEDTIME AS NEEDED FOR ANXIETY OR SLEEP 30 tablet 2  . Calcium-Vitamin D 600-125 MG-UNIT TABS Take 1 tablet by mouth daily.    . famotidine (PEPCID) 20 MG tablet Take 40 mg by mouth 2 (two) times daily.    . fluticasone furoate-vilanterol (BREO ELLIPTA) 100-25 MCG/INH AEPB Inhale 1 puff into the lungs daily.    Marland Kitchen gabapentin (NEURONTIN) 800 MG tablet Take 1 tablet (800 mg total) by mouth 4 (four) times daily as needed. 120 tablet 1  . lidocaine (LIDODERM) 5 % Place 1 patch onto the skin daily. Remove & Discard patch within 12 hours or as directed by MD 30 patch  1  . Multiple Vitamin (MULTIVITAMIN) tablet Take 1 tablet by mouth daily.    . Pyridoxine HCl (VITAMIN B-6) 250 MG tablet Take 1 tablet (250 mg total) by mouth daily. 30 tablet 12  . TURMERIC PO Take 1 tablet by mouth daily.    . vitamin E 200 UNIT capsule Take 200 Units by mouth daily.    Marland Kitchen vortioxetine HBr (TRINTELLIX) 5 MG TABS tablet Take 1 tablet (5 mg total) by mouth daily. 90 tablet 1   No current facility-administered medications on file prior to visit.    BP 138/73 (BP Location: Right Arm, Patient Position: Sitting, Cuff Size: Small)   Pulse 92   Temp 98.5 F (36.9 C) (Oral)   Resp 16   Ht 5\' 8"  (1.727 m)   Wt 157 lb (71.2 kg)   SpO2 97%   BMI 23.87 kg/m    Objective:   Physical Exam        Assessment & Plan:

## 2020-11-25 NOTE — Progress Notes (Signed)
Subjective:    Patient ID: Teresa Morgan, female    DOB: [birth year], 65 y.o.   MRN: [id number]  HPI  Patient is a 65 yr old female who presents today for follow up.   Anxiety/Depression- Has trouble sleeping through the whole night.  She reports that she uses melatonin to help her fall back to sleep.  She is maintained on trintellix 5mg .  Notes that she has a lot of stress at home and with her marriage.  She would like a referral to a counselor to further discuss her stressors/feelings. She is thinking about going back to work part time to get out of the house.    Hx of stage IIIB colon cancer- s/p resection/chemo 2012.  She is followed annually with Oncology.    Tobacco abuse- States that she is not ready to quit.  She feels that this is  Stress reliever for her when she gets stressed at home with her husband.     Review of Systems See HPI  Past Medical History:  Diagnosis Date  . Anemia   . Anxiety   . Arthritis   . Cataract    bilat removed  . Colon cancer [facility]) 2013   cecal cancer  . COPD with emphysema ([place]) 06/18/2012  . Depression   . GERD (gastroesophageal reflux disease)   . Neuromuscular disorder ([place])    neuropathy from chemo  . Peptic ulcer   . Pneumonia   . Shortness of breath dyspnea    with exertion     Social History   Socioeconomic History  . Marital status: Married    Spouse name: Not on file  . Number of children: 3  . Years of education: Not on file  . Highest education level: Not on file  Occupational History  . Occupation: Armed forces operational officer: [employer]  Tobacco Use  . Smoking status: Current Every Day Smoker    Packs/day: 1.00    Years: 45.00    Pack years: 45.00    Types: Cigarettes    Start date: 04/23/1986    Last attempt to quit: 12/25/2017    Years since quitting: 2.9  . Smokeless tobacco: Never Used  Substance and Sexual Activity  . Alcohol use: Yes    Alcohol/week: 0.0 standard drinks    Comment: social  . Drug use: No   . Sexual activity: Not on file  Other Topics Concern  . 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 APPENDECTOMY  07/07/11  . BREAST EXCISIONAL BIOPSY Right 03/15/2018  . COLON RESECTION  2012   for colon cancer  . COLONOSCOPY    . EYE SURGERY Bilateral    cataract surgery with lens implant  . heel spurs Right   . hemrrhoids    . PLANTAR FASCIA SURGERY Right 2009  . PORT-A-CATH REMOVAL Left 04/19/2016   Procedure: REMOVAL PORT-A-CATH;  Surgeon: [name], MD;  Location: [place];  Service: General;  Laterality: Left;  . portacath    .  RADIOACTIVE SEED GUIDED EXCISIONAL BREAST BIOPSY Right 03/15/2018   Procedure: RADIOACTIVE SEED GUIDED EXCISIONAL BREAST BIOPSY;  Surgeon: [name], MD;  Location: [place];  Service: General;  Laterality: Right;  . TUBAL LIGATION  07/1980    Family History  Problem Relation Age of Onset  . Diabetes Mother   . Colon cancer Cousin   . Colon cancer Cousin   . Thalassemia Daughter   . Colon polyps Neg Hx   . Rectal cancer Neg Hx   . Stomach cancer Neg Hx     Allergies  Allergen Reactions  . Aspirin     Upset stomach  . Cymbalta [Duloxetine Hcl] Swelling    Swollen lips   . Prozac [Fluoxetine Hcl] Swelling    Lip swelling  . Wellbutrin [Bupropion] Swelling    Lip swelling    Current Outpatient Medications on File Prior to Visit  Medication Sig Dispense Refill  . ALPRAZolam (XANAX) 1 MG tablet TAKE 1 TABLET BY MOUTH EVERY NIGHT AT BEDTIME AS NEEDED FOR ANXIETY OR SLEEP 30 tablet 2  . Calcium-Vitamin D 600-125  MG-UNIT TABS Take 1 tablet by mouth daily.    . famotidine (PEPCID) 20 MG tablet Take 40 mg by mouth 2 (two) times daily.    . fluticasone furoate-vilanterol (BREO ELLIPTA) 100-25 MCG/INH AEPB Inhale 1 puff into the lungs daily.    Marland Kitchen gabapentin (NEURONTIN) 800 MG tablet Take 1 tablet (800 mg total) by mouth 4 (four) times daily as needed. 120 tablet 1  . lidocaine (LIDODERM) 5 % Place 1 patch onto the skin daily. Remove & Discard patch within 12 hours or as directed by MD 30 patch 1  . Multiple Vitamin (MULTIVITAMIN) tablet Take 1 tablet by mouth daily.    . Pyridoxine HCl (VITAMIN B-6) 250 MG tablet Take 1 tablet (250 mg total) by mouth daily. 30 tablet 12  . TURMERIC PO Take 1 tablet by mouth daily.    . vitamin E 200 UNIT capsule Take 200 Units by mouth daily.    Marland Kitchen vortioxetine HBr (TRINTELLIX) 5 MG TABS tablet Take 1 tablet (5 mg total) by mouth daily. 90 tablet 1   No current facility-administered medications on file prior to visit.    BP 138/73 (BP Location: Right Arm, Patient Position: Sitting, Cuff Size: Small)   Pulse 92   Temp 98.5 F (36.9 C) (Oral)   Resp 16   Ht 5\' 8"  (1.727 m)   Wt 157 lb (71.2 kg)   SpO2 97%   BMI 23.87 kg/m       Objective:   Physical Exam Constitutional:      Appearance: She is well-developed and well-nourished.  Cardiovascular:     Rate and Rhythm: Normal rate and regular rhythm.     Heart sounds: Normal heart sounds. No murmur heard.   Pulmonary:     Effort: Pulmonary effort is normal. No respiratory distress.     Breath sounds: Normal breath sounds. No wheezing.  Psychiatric:        Mood and Affect: Mood and affect normal.        Behavior: Behavior normal.        Thought Content: Thought content normal.        Judgment: Judgment normal.           Assessment & Plan:  Anxiety/Depression- Overall doing well on Trintellix 5mg . Pt given information on scheduling an appointment with a counselor.   GAD 7 : Generalized Anxiety Score  11/24/2020  Nervous, Anxious, on Edge 1  Control/stop worrying 1  Trouble relaxing 0  Restless 0  Easily annoyed or irritable 1  Afraid - awful might happen 0  Anxiety Difficulty Somewhat difficult    Depression screen [facility] 2/9 11/24/2020 09/25/2020 05/20/2020 10/22/2018 01/22/2018  Decreased Interest 0 2 0 0 1  Down, Depressed, Hopeless 3 1 0 0 1  PHQ - 2 Score 3 3 0 0 2  Altered sleeping 3 3 3 3 2   Tired, decreased energy 0 0 0 0 1  Change in appetite 0 0 0 0 1  Feeling bad or failure about yourself  0 0 0 0 0  Trouble concentrating 0 0 0 0 0  Moving slowly or fidgety/restless 0 0 0 0 0  Suicidal thoughts 0 0 0 0 0  PHQ-9 Score 6 6 3 3 6   Difficult doing work/chores Somewhat difficult Not difficult at all Not difficult at all - Not difficult at all  Some recent data might be hidden   Hx of colon cancer- surveillance per oncology. No clinical sign of recurrence.  Tobacco abuse- not ready to quit at this time.  Cessation counseling is provided.  This visit occurred during the SARS-CoV-2 public health emergency.  Safety protocols were in place, including screening questions prior to the visit, additional usage of staff PPE, and extensive cleaning of exam room while observing appropriate contact time as indicated for disinfecting solutions.

## 2020-12-01 ENCOUNTER — Other Ambulatory Visit (HOSPITAL_BASED_OUTPATIENT_CLINIC_OR_DEPARTMENT_OTHER): Payer: Self-pay | Admitting: Physician Assistant

## 2020-12-01 DIAGNOSIS — M545 Low back pain, unspecified: Secondary | ICD-10-CM | POA: Diagnosis not present

## 2020-12-01 DIAGNOSIS — F17200 Nicotine dependence, unspecified, uncomplicated: Secondary | ICD-10-CM | POA: Diagnosis not present

## 2020-12-01 DIAGNOSIS — Z79899 Other long term (current) drug therapy: Secondary | ICD-10-CM | POA: Diagnosis not present

## 2020-12-07 MED FILL — OXYCODONE-APAP 10-325: 10-325 | 30 days supply | Qty: 120 | Fill #0

## 2020-12-10 ENCOUNTER — Other Ambulatory Visit: Payer: Self-pay | Admitting: Pulmonary Disease

## 2020-12-10 ENCOUNTER — Other Ambulatory Visit: Payer: Self-pay | Admitting: Hematology & Oncology

## 2020-12-10 ENCOUNTER — Other Ambulatory Visit: Payer: Self-pay | Admitting: Physical Medicine and Rehabilitation

## 2020-12-10 DIAGNOSIS — C180 Malignant neoplasm of cecum: Secondary | ICD-10-CM

## 2020-12-10 DIAGNOSIS — G629 Polyneuropathy, unspecified: Secondary | ICD-10-CM

## 2020-12-10 DIAGNOSIS — J430 Unilateral pulmonary emphysema [MacLeod's syndrome]: Secondary | ICD-10-CM

## 2020-12-10 MED FILL — BREO ELLIPTA 100-25 MCG INH: 100-25 | 60 days supply | Qty: 60 | Fill #0

## 2020-12-10 MED FILL — ALPRAZolam 1 MG TABS: 1 | 30 days supply | Qty: 30 | Fill #0

## 2020-12-10 NOTE — Telephone Encounter (Signed)
Refill request on Gabapentin, no indication in last note to continue. Is it okay to refill?

## 2020-12-10 NOTE — Telephone Encounter (Signed)
Yes that is ok

## 2020-12-11 ENCOUNTER — Other Ambulatory Visit: Payer: Self-pay | Admitting: Family

## 2020-12-11 MED ORDER — GABAPENTIN 800 MG PO TABS
800.0000 mg | ORAL_TABLET | Freq: Four times a day (QID) | ORAL | 1 refills | Status: DC | PRN
Start: 2020-12-11 — End: 2020-12-11

## 2020-12-11 MED FILL — GABAPENTIN 800 MG TABS: 800 | 20 days supply | Qty: 120 | Fill #0

## 2020-12-11 MED FILL — LIDOCAINE PATCH 5%: 5 | 30 days supply | Qty: 30 | Fill #1

## 2020-12-11 MED FILL — TRINTELLIX 5 MG TABLET: 5 | 90 days supply | Qty: 90 | Fill #0

## 2020-12-11 NOTE — Addendum Note (Signed)
Addended by: Jasmine December T on: 12/11/2020 12:49 PM   Modules accepted: Orders

## 2020-12-17 ENCOUNTER — Ambulatory Visit (INDEPENDENT_AMBULATORY_CARE_PROVIDER_SITE_OTHER): Payer: Medicare Other | Admitting: Psychologist

## 2020-12-17 DIAGNOSIS — F330 Major depressive disorder, recurrent, mild: Secondary | ICD-10-CM

## 2020-12-24 ENCOUNTER — Ambulatory Visit (INDEPENDENT_AMBULATORY_CARE_PROVIDER_SITE_OTHER): Payer: Medicare Other | Admitting: Psychologist

## 2020-12-24 DIAGNOSIS — F330 Major depressive disorder, recurrent, mild: Secondary | ICD-10-CM | POA: Diagnosis not present

## 2020-12-28 ENCOUNTER — Ambulatory Visit (INDEPENDENT_AMBULATORY_CARE_PROVIDER_SITE_OTHER): Payer: Medicare Other | Admitting: Psychologist

## 2020-12-28 DIAGNOSIS — F330 Major depressive disorder, recurrent, mild: Secondary | ICD-10-CM

## 2021-01-04 ENCOUNTER — Other Ambulatory Visit (HOSPITAL_BASED_OUTPATIENT_CLINIC_OR_DEPARTMENT_OTHER): Payer: Self-pay | Admitting: Physician Assistant

## 2021-01-04 DIAGNOSIS — M545 Low back pain, unspecified: Secondary | ICD-10-CM | POA: Diagnosis not present

## 2021-01-04 DIAGNOSIS — F17200 Nicotine dependence, unspecified, uncomplicated: Secondary | ICD-10-CM | POA: Diagnosis not present

## 2021-01-04 DIAGNOSIS — Z79899 Other long term (current) drug therapy: Secondary | ICD-10-CM | POA: Diagnosis not present

## 2021-01-04 MED FILL — GABAPENTIN 800 MG TABS: 800 | 20 days supply | Qty: 120 | Fill #1

## 2021-01-05 MED FILL — OXYCODONE-APAP 10-325: 10-325 | 30 days supply | Qty: 120 | Fill #0

## 2021-01-07 ENCOUNTER — Other Ambulatory Visit: Payer: Self-pay

## 2021-01-07 ENCOUNTER — Encounter: Payer: Self-pay | Admitting: Adult Health

## 2021-01-07 ENCOUNTER — Other Ambulatory Visit: Payer: Self-pay | Admitting: Physical Medicine and Rehabilitation

## 2021-01-07 ENCOUNTER — Other Ambulatory Visit: Payer: Self-pay | Admitting: Adult Health

## 2021-01-07 ENCOUNTER — Ambulatory Visit (INDEPENDENT_AMBULATORY_CARE_PROVIDER_SITE_OTHER): Payer: Medicare Other | Admitting: Adult Health

## 2021-01-07 DIAGNOSIS — R911 Solitary pulmonary nodule: Secondary | ICD-10-CM

## 2021-01-07 DIAGNOSIS — J439 Emphysema, unspecified: Secondary | ICD-10-CM | POA: Diagnosis not present

## 2021-01-07 DIAGNOSIS — Z720 Tobacco use: Secondary | ICD-10-CM

## 2021-01-07 DIAGNOSIS — J309 Allergic rhinitis, unspecified: Secondary | ICD-10-CM

## 2021-01-07 MED ORDER — BREO ELLIPTA 100-25 MCG/INH IN AEPB
1.0000 | INHALATION_SPRAY | Freq: Every day | RESPIRATORY_TRACT | 6 refills | Status: DC
Start: 2021-01-07 — End: 2021-01-07

## 2021-01-07 MED ORDER — ALBUTEROL SULFATE HFA 108 (90 BASE) MCG/ACT IN AERS: 1.0000 | INHALATION_SPRAY | Freq: Four times a day (QID) | RESPIRATORY_TRACT | 2 refills | Status: DC | PRN

## 2021-01-07 MED FILL — BREO ELLIPTA 100-25 MCG INH: 100-25 | 30 days supply | Qty: 60 | Fill #1

## 2021-01-07 MED FILL — LIDOCAINE 5 % PTCH: 5 | 30 days supply | Qty: 30 | Fill #0

## 2021-01-07 MED FILL — ALBUTEROL SULFATE HFA 108 (: 108 (90 BAS | 25 days supply | Qty: 9 | Fill #0

## 2021-01-07 MED FILL — ALPRAZolam 1 MG TABS: 1 | 30 days supply | Qty: 30 | Fill #1

## 2021-01-07 NOTE — Assessment & Plan Note (Signed)
Increase symptoms.  We will add in Claritin and saline nasal spray Smoking cessation encouraged  Plan  Patient Instructions  Continue on Breo 1 puff daily, rinse after use Activity as tolerated Albuterol inhaler as needed Claritin 10mg  daily As needed  Drainage  Saline nasal rinses As needed   Work on quitting smoking .  Continue a follow-up for lung cancer screening CT Follow-up with Dr. Elsworth Soho in 1 year and as needed

## 2021-01-07 NOTE — Assessment & Plan Note (Signed)
Smoking cessation encouraged Continue with follow-up with lung cancer screening program

## 2021-01-07 NOTE — Assessment & Plan Note (Signed)
Recent CT chest November 2021 showed stable pulmonary nodules, new right upper lobe small nodule noted.  Has a follow-up CT chest in 6 months planned

## 2021-01-07 NOTE — Assessment & Plan Note (Signed)
COPD appears stable.  Smoking cessation is encouraged.  Plan  Patient Instructions  Continue on Breo 1 puff daily, rinse after use Activity as tolerated Albuterol inhaler as needed Claritin 10mg  daily As needed  Drainage  Saline nasal rinses As needed   Work on quitting smoking .  Continue a follow-up for lung cancer screening CT Follow-up with Dr. Elsworth Soho in 1 year and as needed

## 2021-01-07 NOTE — Patient Instructions (Addendum)
Continue on Breo 1 puff daily, rinse after use Activity as tolerated Albuterol inhaler as needed Claritin 10mg  daily As needed  Drainage  Saline nasal rinses As needed   Work on quitting smoking .  Continue a follow-up for lung cancer screening CT Follow-up with Dr. Elsworth Soho in 1 year and as needed

## 2021-01-07 NOTE — Progress Notes (Signed)
@Patient  ID: Teresa Morgan, female    DOB: [birth year], 65 y.o.   MRN: [id number]  Chief Complaint  Patient presents with  . Follow-up         Referring provider: Debbrah Alar, NP  HPI: 65 year old female active smoker followed for moderate COPD and emphysema Medical history significant for cecum adenocarcinoma stage IIIb followed by Dr. Marin Olp status post resection and chemo in 2012 through 2013   TEST/EVENTS :  /2019 Spirometry  shows mild airway obstruction with ratio 69 and FEV1 77%  CT chest in 2015 with tiny nodules .  CT chest in 01/2016 with stable right lung nodules , additional nodules improved or resolved. LDCT 08/2019 RADS-2 , tiny nodules 39mm on RT  Low-dose CT September 15, 2020 lung RADS 3 probable benign findings, emphysema previous lung nodules are unchanged measuring 5.7 mm.  New nodule right upper lobe 5.4 mm  01/07/2021 Follow up ; COPD, emphysema, lung nodules Patient returns for a 1 year follow-up.  Patient says overall breathing is doing about the same.  Has intermittent cough and wheezing.  She continues to smoke 1 pack/day. Patient remains on Breo daily. Very rare use of albuterol .  Activity level is about the same. Very active in summer, works in the yard.  Has daily cough with post nasal drip . Worse with changes in temps.   Patient follows with the lung cancer screening program.  Last CT was September 15, 2020.  This showed stable changes with no change and previous lung nodules measuring 5.7 mm.  There was a new right upper lobe 5.4 mm nodule.  There is a planned CT follow-up in 6 months.  Covid vaccine including booster up-to-date  Allergies  Allergen Reactions  . Aspirin     Upset stomach  . Cymbalta [Duloxetine Hcl] Swelling    Swollen lips   . Prozac [Fluoxetine Hcl] Swelling    Lip swelling  . Wellbutrin [Bupropion] Swelling    Lip swelling    Immunization History  Administered Date(s) Administered  . Influenza Split 11/14/2012   . Influenza, High Dose Seasonal PF 08/24/2020  . Influenza, Seasonal, Injecte, Preservative Fre 11/12/2014  . Influenza,inj,Quad PF,6+ Mos 07/24/2017, 10/22/2018  . Influenza-Unspecified 09/13/2013, 07/31/2015, 08/04/2016  . Moderna Sars-Covid-2 Vaccination 12/28/2019, 01/18/2020, 10/05/2020  . Pneumococcal Polysaccharide-23 03/12/2013, 05/20/2020  . Tdap 03/12/2013  . Zoster Recombinat (Shingrix) 10/22/2018, 05/07/2019    Past Medical History:  Diagnosis Date  . Anemia   . Anxiety   . Arthritis   . Cataract    bilat removed  . Colon cancer [facility]) 2013   cecal cancer  . COPD with emphysema ([place]) 06/18/2012  . Depression   . GERD (gastroesophageal reflux disease)   . Neuromuscular disorder ([place])    neuropathy from chemo  . Peptic ulcer   . Pneumonia   . Shortness of breath dyspnea    with exertion    Tobacco History: Social History   Tobacco Use  Smoking Status Current Every Day Smoker  . Packs/day: 1.00  . Years: 45.00  . Pack years: 45.00  . Types: Cigarettes  . Start date: 04/23/1986  Smokeless Tobacco Never Used   Ready to quit: Not Answered Counseling given: Not Answered   Outpatient Medications Prior to Visit  Medication Sig Dispense Refill  . ALPRAZolam (XANAX) 1 MG tablet TAKE 1 TABLET BY MOUTH EVERY NIGHT AT BEDTIME AS NEEDED FOR ANXIETY OR SLEEP 30 tablet 2  . Calcium-Vitamin D 600-125 MG-UNIT TABS Take 1 tablet  by mouth daily.    . famotidine (PEPCID) 20 MG tablet Take 40 mg by mouth 2 (two) times daily.    Marland Kitchen gabapentin (NEURONTIN) 800 MG tablet Take 1 tablet (800 mg total) by mouth 4 (four) times daily as needed. 120 tablet 1  . lidocaine (LIDODERM) 5 % PLACE 1 PATCH ONTO THE SKIN DAILY. REMOVE & DISCARD PATCH WITHIN 12 HOURS OR AS DIRECTED BY MD 30 patch 1  . Multiple Vitamin (MULTIVITAMIN) tablet Take 1 tablet by mouth daily.    . Pyridoxine HCl (VITAMIN B-6) 250 MG tablet Take 1 tablet (250 mg total) by mouth daily. 30 tablet 12  . TRINTELLIX 5 MG  TABS tablet TAKE 1 TABLET (5 MG TOTAL) BY MOUTH DAILY. 90 tablet 1  . TURMERIC PO Take 1 tablet by mouth daily.    . vitamin E 200 UNIT capsule Take 200 Units by mouth daily.    Marland Kitchen BREO ELLIPTA 100-25 MCG/INH AEPB INHALE 1 PUFF BY MOUTH INTO THE LUNGS ONCE DAILY 60 each 6   No facility-administered medications prior to visit.     Review of Systems:   Constitutional:   No  weight loss, night sweats,  Fevers, chills,  +fatigue, or  lassitude.  HEENT:   No headaches,  Difficulty swallowing,  Tooth/dental problems, or  Sore throat,                No sneezing, itching, ear ache, nasal congestion, post nasal drip,   CV:  No chest pain,  Orthopnea, PND, swelling in lower extremities, anasarca, dizziness, palpitations, syncope.   GI  No heartburn, indigestion, abdominal pain, nausea, vomiting, diarrhea, change in bowel habits, loss of appetite, bloody stools.   Resp:    No chest wall deformity  Skin: no rash or lesions.  GU: no dysuria, change in color of urine, no urgency or frequency.  No flank pain, no hematuria   MS:  No joint pain or swelling.  No decreased range of motion.  No back pain.    Physical Exam  BP 124/82 (BP Location: Left Arm, Cuff Size: Normal)   Pulse 99   Temp (!) 97 F (36.1 C)   Ht 5\' 8"  (1.727 m)   Wt 155 lb (70.3 kg)   SpO2 99%   BMI 23.57 kg/m   GEN: A/Ox3; pleasant , NAD, well nourished    HEENT:  [place]/AT,    NOSE-clear, THROAT-clear, no lesions, no postnasal drip or exudate noted.   NECK:  Supple w/ fair ROM; no JVD; normal carotid impulses w/o bruits; no thyromegaly or nodules palpated; no lymphadenopathy.    RESP  Clear  P & A; w/o, wheezes/ rales/ or rhonchi. no accessory muscle use, no dullness to percussion  CARD:  RRR, no m/r/g, no peripheral edema, pulses intact, no cyanosis or clubbing.  GI:   Soft & nt; nml bowel sounds; no organomegaly or masses detected.   Musco: Warm bil, no deformities or joint swelling noted.   Neuro: alert, no  focal deficits noted.    Skin: Warm, no lesions or rashes    Lab Results:  BMET  BNP No results found for: BNP  ProBNP No results found for: PROBNP  Imaging: No results found.    PFT Results Latest Ref Rng & Units 12/27/2017  FVC-Pre L 3.21  FVC-Predicted Pre % 85  Pre FEV1/FVC % % 69  FEV1-Pre L 2.23  FEV1-Predicted Pre % 77    No results found for: NITRICOXIDE  Assessment & Plan:   COPD with emphysema, Gold B COPD appears stable.  Smoking cessation is encouraged.  Plan  Patient Instructions  Continue on Breo 1 puff daily, rinse after use Activity as tolerated Albuterol inhaler as needed Claritin 10mg  daily As needed  Drainage  Saline nasal rinses As needed   Work on quitting smoking .  Continue a follow-up for lung cancer screening CT Follow-up with Dr. Elsworth Soho in 1 year and as needed      Allergic rhinitis Increase symptoms.  We will add in Claritin and saline nasal spray Smoking cessation encouraged  Plan  Patient Instructions  Continue on Breo 1 puff daily, rinse after use Activity as tolerated Albuterol inhaler as needed Claritin 10mg  daily As needed  Drainage  Saline nasal rinses As needed   Work on quitting smoking .  Continue a follow-up for lung cancer screening CT Follow-up with Dr. Elsworth Soho in 1 year and as needed      Tobacco abuse Smoking cessation encouraged Continue with follow-up with lung cancer screening program  Lung nodule seen on imaging study Recent CT chest November 2021 showed stable pulmonary nodules, new right upper lobe small nodule noted.  Has a follow-up CT chest in 6 months planned     Rexene Edison, NP 01/07/2021

## 2021-01-08 ENCOUNTER — Ambulatory Visit (INDEPENDENT_AMBULATORY_CARE_PROVIDER_SITE_OTHER): Payer: Medicare Other | Admitting: Psychologist

## 2021-01-08 DIAGNOSIS — F330 Major depressive disorder, recurrent, mild: Secondary | ICD-10-CM | POA: Diagnosis not present

## 2021-01-20 ENCOUNTER — Ambulatory Visit: Payer: Medicare Other | Admitting: Psychologist

## 2021-02-01 ENCOUNTER — Other Ambulatory Visit (HOSPITAL_BASED_OUTPATIENT_CLINIC_OR_DEPARTMENT_OTHER): Payer: Self-pay | Admitting: Physician Assistant

## 2021-02-01 DIAGNOSIS — F17200 Nicotine dependence, unspecified, uncomplicated: Secondary | ICD-10-CM | POA: Diagnosis not present

## 2021-02-01 DIAGNOSIS — Z79899 Other long term (current) drug therapy: Secondary | ICD-10-CM | POA: Diagnosis not present

## 2021-02-01 DIAGNOSIS — M545 Low back pain, unspecified: Secondary | ICD-10-CM | POA: Diagnosis not present

## 2021-02-01 MED FILL — OXYCODONE-APAP 10-325: 10-325 | 30 days supply | Qty: 120 | Fill #0

## 2021-02-05 MED FILL — BREO ELLIPTA 100-25 MCG INH: 100-25 | 30 days supply | Qty: 60 | Fill #2

## 2021-02-05 MED FILL — LIDOCAINE 5 % PTCH: 5 | 30 days supply | Qty: 30 | Fill #1

## 2021-02-05 MED FILL — GABAPENTIN 800 MG TABS: 800 | 30 days supply | Qty: 120 | Fill #0

## 2021-02-05 MED FILL — ALPRAZolam 1 MG TABS: 1 | 30 days supply | Qty: 30 | Fill #2

## 2021-02-13 ENCOUNTER — Other Ambulatory Visit (HOSPITAL_BASED_OUTPATIENT_CLINIC_OR_DEPARTMENT_OTHER): Payer: Self-pay

## 2021-03-02 ENCOUNTER — Other Ambulatory Visit (HOSPITAL_BASED_OUTPATIENT_CLINIC_OR_DEPARTMENT_OTHER): Payer: Self-pay

## 2021-03-02 DIAGNOSIS — M545 Low back pain, unspecified: Secondary | ICD-10-CM | POA: Diagnosis not present

## 2021-03-02 DIAGNOSIS — F17200 Nicotine dependence, unspecified, uncomplicated: Secondary | ICD-10-CM | POA: Diagnosis not present

## 2021-03-02 DIAGNOSIS — Z79899 Other long term (current) drug therapy: Secondary | ICD-10-CM | POA: Diagnosis not present

## 2021-03-02 MED ORDER — OXYCODONE-ACETAMINOPHEN 10-325 MG PO TABS
ORAL_TABLET | ORAL | 0 refills | Status: DC
Start: 2021-03-02 — End: 2021-03-30
  Filled 2021-03-02: qty 120, 30d supply, fill #0

## 2021-03-08 ENCOUNTER — Other Ambulatory Visit (HOSPITAL_BASED_OUTPATIENT_CLINIC_OR_DEPARTMENT_OTHER): Payer: Self-pay

## 2021-03-08 ENCOUNTER — Other Ambulatory Visit: Payer: Self-pay | Admitting: Hematology & Oncology

## 2021-03-08 DIAGNOSIS — G629 Polyneuropathy, unspecified: Secondary | ICD-10-CM

## 2021-03-08 DIAGNOSIS — J430 Unilateral pulmonary emphysema [MacLeod's syndrome]: Secondary | ICD-10-CM

## 2021-03-08 DIAGNOSIS — C180 Malignant neoplasm of cecum: Secondary | ICD-10-CM

## 2021-03-08 MED ORDER — ALPRAZOLAM 1 MG PO TABS
ORAL_TABLET | ORAL | 2 refills | Status: DC
  Filled 2021-03-08: qty 30, 30d supply, fill #0
  Filled 2021-04-05: qty 30, 30d supply, fill #1
  Filled 2021-05-05: qty 30, 30d supply, fill #2

## 2021-03-08 MED FILL — Fluticasone Furoate-Vilanterol Aero Powd BA 100-25 MCG/ACT: RESPIRATORY_TRACT | 60 days supply | Qty: 60 | Fill #0 | Status: AC

## 2021-03-08 MED FILL — Vortioxetine HBr Tab 5 MG (Base Equiv): ORAL | 90 days supply | Qty: 90 | Fill #0 | Status: AC

## 2021-03-30 ENCOUNTER — Ambulatory Visit: Payer: Medicare Other | Attending: Internal Medicine

## 2021-03-30 ENCOUNTER — Other Ambulatory Visit: Payer: Self-pay | Admitting: Physical Medicine and Rehabilitation

## 2021-03-30 ENCOUNTER — Other Ambulatory Visit (HOSPITAL_BASED_OUTPATIENT_CLINIC_OR_DEPARTMENT_OTHER): Payer: Self-pay

## 2021-03-30 DIAGNOSIS — Z23 Encounter for immunization: Secondary | ICD-10-CM

## 2021-03-30 DIAGNOSIS — F17200 Nicotine dependence, unspecified, uncomplicated: Secondary | ICD-10-CM | POA: Diagnosis not present

## 2021-03-30 DIAGNOSIS — Z79899 Other long term (current) drug therapy: Secondary | ICD-10-CM | POA: Diagnosis not present

## 2021-03-30 DIAGNOSIS — Z20822 Contact with and (suspected) exposure to covid-19: Secondary | ICD-10-CM | POA: Diagnosis not present

## 2021-03-30 DIAGNOSIS — M545 Low back pain, unspecified: Secondary | ICD-10-CM | POA: Diagnosis not present

## 2021-03-30 MED ORDER — OXYCODONE-ACETAMINOPHEN 10-325 MG PO TABS
ORAL_TABLET | ORAL | 0 refills | Status: DC
  Filled 2021-03-30: qty 120, 30d supply, fill #0

## 2021-03-30 MED ORDER — LIDOCAINE 5 % EX PTCH
1.0000 | MEDICATED_PATCH | Freq: Every day | CUTANEOUS | 1 refills | Status: AC
  Filled 2021-03-30: qty 30, 30d supply, fill #0
  Filled 2021-04-27: qty 30, 30d supply, fill #1

## 2021-03-30 MED FILL — Albuterol Sulfate Inhal Aero 108 MCG/ACT (90MCG Base Equiv): RESPIRATORY_TRACT | 17 days supply | Qty: 8.5 | Fill #0 | Status: AC

## 2021-03-30 MED FILL — Gabapentin Tab 800 MG: ORAL | 30 days supply | Qty: 120 | Fill #0 | Status: AC

## 2021-03-30 NOTE — Progress Notes (Signed)
   Covid-19 Vaccination Clinic  Name:  Teresa Morgan    MRN: [id number] DOB: [birth year]  03/30/2021  [name] was observed post Covid-19 immunization for 15 minutes without incident. She was provided with Vaccine Information Sheet and instruction to access the V-Safe system.   [name] was instructed to call 911 with any severe reactions post vaccine: Marland Kitchen Difficulty breathing  . Swelling of face and throat  . A fast heartbeat  . A bad rash all over body  . Dizziness and weakness   Immunizations Administered    Name Date Dose VIS Date Route   Moderna Covid-19 Booster Vaccine 03/30/2021  2:35 PM 0.25 mL 09/02/2020 Intramuscular   Manufacturer: Moderna   Lot: [id number]   [place]: 09735-329-92

## 2021-04-01 ENCOUNTER — Other Ambulatory Visit (HOSPITAL_BASED_OUTPATIENT_CLINIC_OR_DEPARTMENT_OTHER): Payer: Self-pay

## 2021-04-01 MED ORDER — MODERNA COVID-19 VACCINE 100 MCG/0.5ML IM SUSP
INTRAMUSCULAR | 0 refills | Status: DC
  Filled 2021-04-01: qty 0.25, 1d supply, fill #0

## 2021-04-02 ENCOUNTER — Other Ambulatory Visit (HOSPITAL_BASED_OUTPATIENT_CLINIC_OR_DEPARTMENT_OTHER): Payer: Self-pay

## 2021-04-05 ENCOUNTER — Other Ambulatory Visit (HOSPITAL_BASED_OUTPATIENT_CLINIC_OR_DEPARTMENT_OTHER): Payer: Self-pay

## 2021-04-05 DIAGNOSIS — J440 Chronic obstructive pulmonary disease with acute lower respiratory infection: Secondary | ICD-10-CM | POA: Diagnosis not present

## 2021-04-05 DIAGNOSIS — F17299 Nicotine dependence, other tobacco product, with unspecified nicotine-induced disorders: Secondary | ICD-10-CM | POA: Diagnosis not present

## 2021-04-07 ENCOUNTER — Other Ambulatory Visit (HOSPITAL_BASED_OUTPATIENT_CLINIC_OR_DEPARTMENT_OTHER): Payer: Self-pay

## 2021-04-26 ENCOUNTER — Other Ambulatory Visit (HOSPITAL_BASED_OUTPATIENT_CLINIC_OR_DEPARTMENT_OTHER): Payer: Self-pay

## 2021-04-26 DIAGNOSIS — M545 Low back pain, unspecified: Secondary | ICD-10-CM | POA: Diagnosis not present

## 2021-04-26 DIAGNOSIS — Z79899 Other long term (current) drug therapy: Secondary | ICD-10-CM | POA: Diagnosis not present

## 2021-04-26 DIAGNOSIS — F17200 Nicotine dependence, unspecified, uncomplicated: Secondary | ICD-10-CM | POA: Diagnosis not present

## 2021-04-26 MED ORDER — OXYCODONE-ACETAMINOPHEN 10-325 MG PO TABS
ORAL_TABLET | ORAL | 0 refills | Status: DC
  Filled 2021-04-26 – 2021-04-28 (×2): qty 120, 30d supply, fill #0

## 2021-04-26 MED ORDER — CYCLOBENZAPRINE HCL 10 MG PO TABS
1.0000 | ORAL_TABLET | Freq: Every day | ORAL | 6 refills | Status: DC
  Filled 2021-04-26: qty 30, 30d supply, fill #0
  Filled 2021-05-26: qty 30, 30d supply, fill #1
  Filled 2021-06-30: qty 30, 30d supply, fill #2

## 2021-04-27 ENCOUNTER — Other Ambulatory Visit: Payer: Self-pay | Admitting: Physical Medicine and Rehabilitation

## 2021-04-27 ENCOUNTER — Other Ambulatory Visit (HOSPITAL_BASED_OUTPATIENT_CLINIC_OR_DEPARTMENT_OTHER): Payer: Self-pay

## 2021-04-27 MED FILL — Albuterol Sulfate Inhal Aero 108 MCG/ACT (90MCG Base Equiv): RESPIRATORY_TRACT | 17 days supply | Qty: 8.5 | Fill #1 | Status: AC

## 2021-04-27 NOTE — Telephone Encounter (Signed)
Patient has no f/u appointment scheduled with me

## 2021-04-28 ENCOUNTER — Other Ambulatory Visit (HOSPITAL_BASED_OUTPATIENT_CLINIC_OR_DEPARTMENT_OTHER): Payer: Self-pay

## 2021-05-05 ENCOUNTER — Other Ambulatory Visit (HOSPITAL_BASED_OUTPATIENT_CLINIC_OR_DEPARTMENT_OTHER): Payer: Self-pay

## 2021-05-25 ENCOUNTER — Encounter: Payer: Medicare Other | Admitting: Family

## 2021-05-25 ENCOUNTER — Other Ambulatory Visit (HOSPITAL_BASED_OUTPATIENT_CLINIC_OR_DEPARTMENT_OTHER): Payer: Self-pay

## 2021-05-25 DIAGNOSIS — Z79899 Other long term (current) drug therapy: Secondary | ICD-10-CM | POA: Diagnosis not present

## 2021-05-25 DIAGNOSIS — M545 Low back pain, unspecified: Secondary | ICD-10-CM | POA: Diagnosis not present

## 2021-05-25 DIAGNOSIS — F17200 Nicotine dependence, unspecified, uncomplicated: Secondary | ICD-10-CM | POA: Diagnosis not present

## 2021-05-25 MED ORDER — OXYCODONE-ACETAMINOPHEN 10-325 MG PO TABS
ORAL_TABLET | ORAL | 0 refills | Status: DC
  Filled 2021-05-25 – 2021-05-26 (×2): qty 120, 30d supply, fill #0

## 2021-05-26 ENCOUNTER — Other Ambulatory Visit (HOSPITAL_BASED_OUTPATIENT_CLINIC_OR_DEPARTMENT_OTHER): Payer: Self-pay

## 2021-05-26 ENCOUNTER — Other Ambulatory Visit: Payer: Self-pay

## 2021-05-26 ENCOUNTER — Ambulatory Visit (INDEPENDENT_AMBULATORY_CARE_PROVIDER_SITE_OTHER): Payer: Medicare Other | Admitting: Family

## 2021-05-26 ENCOUNTER — Encounter: Payer: Self-pay | Admitting: Family

## 2021-05-26 VITALS — BP 122/80 | HR 93 | Temp 99.0°F | Resp 18 | Ht 68.0 in | Wt 151.2 lb

## 2021-05-26 DIAGNOSIS — Z0000 Encounter for general adult medical examination without abnormal findings: Secondary | ICD-10-CM

## 2021-05-26 DIAGNOSIS — Z91038 Other insect allergy status: Secondary | ICD-10-CM | POA: Diagnosis not present

## 2021-05-26 DIAGNOSIS — F32A Depression, unspecified: Secondary | ICD-10-CM

## 2021-05-26 DIAGNOSIS — E785 Hyperlipidemia, unspecified: Secondary | ICD-10-CM | POA: Diagnosis not present

## 2021-05-26 DIAGNOSIS — R739 Hyperglycemia, unspecified: Secondary | ICD-10-CM

## 2021-05-26 DIAGNOSIS — F419 Anxiety disorder, unspecified: Secondary | ICD-10-CM | POA: Diagnosis not present

## 2021-05-26 LAB — COMPREHENSIVE METABOLIC PANEL
ALT: 11 U/L (ref 0–35)
AST: 17 U/L (ref 0–37)
Albumin: 4.4 g/dL (ref 3.5–5.2)
Alkaline Phosphatase: 76 U/L (ref 39–117)
BUN: 10 mg/dL (ref 6–23)
CO2: 27 mEq/L (ref 19–32)
Calcium: 9.1 mg/dL (ref 8.4–10.5)
Chloride: 102 mEq/L (ref 96–112)
Creatinine, Ser: 0.83 mg/dL (ref 0.40–1.20)
GFR: 73.57 mL/min (ref >60.00)
Glucose, Bld: 75 mg/dL (ref 70–99)
Potassium: 4.1 mEq/L (ref 3.5–5.1)
Sodium: 139 mEq/L (ref 135–145)
Total Bilirubin: 0.7 mg/dL (ref 0.2–1.2)
Total Protein: 7 g/dL (ref 6.0–8.3)

## 2021-05-26 LAB — LIPID PANEL
Cholesterol: 210 mg/dL — ABNORMAL HIGH (ref 0–200)
HDL: 42 mg/dL (ref >39.00)
LDL Cholesterol: 145 mg/dL — ABNORMAL HIGH (ref 0–99)
NonHDL: 167.73
Total CHOL/HDL Ratio: 5
Triglycerides: 113 mg/dL (ref 0.0–149.0)
VLDL: 22.6 mg/dL (ref 0.0–40.0)

## 2021-05-26 LAB — HEMOGLOBIN A1C: Hgb A1c MFr Bld: 6 % (ref 4.6–6.5)

## 2021-05-26 MED ORDER — HYDROXYZINE PAMOATE 25 MG PO CAPS
25.0000 mg | ORAL_CAPSULE | Freq: Three times a day (TID) | ORAL | 0 refills | Status: DC | PRN
  Filled 2021-05-26: qty 30, 10d supply, fill #0

## 2021-05-26 MED ORDER — VORTIOXETINE HBR 5 MG PO TABS
ORAL_TABLET | Freq: Every day | ORAL | 1 refills | Status: DC
  Filled 2021-05-26: qty 90, 90d supply, fill #0
  Filled 2021-08-24: qty 90, 90d supply, fill #1

## 2021-05-26 MED ORDER — EPINEPHRINE 0.3 MG/0.3ML IJ SOAJ
0.3000 mg | INTRAMUSCULAR | 0 refills | Status: DC | PRN
  Filled 2021-05-26: qty 2, 30d supply, fill #0

## 2021-05-26 NOTE — Assessment & Plan Note (Signed)
Stable on trintellix 5mg  once daily.

## 2021-05-26 NOTE — Assessment & Plan Note (Signed)
Recurrent. Will rx with atarax prn itching and provide an rx for epipen. She is advised only to use if tongue/lip swelling/airway closing.  She is advised to call 911 if she needs to use epipen.

## 2021-05-26 NOTE — Progress Notes (Signed)
Subjective:   By signing my name below, I, Shehryar Baig, attest that this documentation has been prepared under the direction and in the presence of Debbrah Alar NP. 05/26/2021    Patient ID: Teresa Morgan, female    DOB: 25-Apr-1956, 65 y.o.   MRN: [id number]  Chief Complaint  Patient presents with   Annual Exam    Pt states fasting     HPI Patient is in today for a comprehensive physical exam.  She denies having any unexpected weight change, hearing loss and rhinorrhea, visual disturbance, cough, chest pain and leg swelling, nausea, vomiting, diarrhea and blood in stool, or dysuria and frequency, for myalgias and arthralgias, rash, adenopathy at this time. She has no recent changes to her family medical history. She occasionally drinks alcohol, she does not use drugs, she continues smoking. She is not sexually active at this time.   Depression- She reports 5 mg trintellix daily PO mildly relieves her symptoms. She also reports she stopped taking 800 mg gabapentin PRN to manage her symptoms.  She stopped seeing a counselor because virtual visits were not effective for her. Bug bite- She complains swollen and itching bug bites. She went to an urgent care and was told to use hydroxyzine to manage her symptoms.   Immunizations: Due for flu shot in fall. She is UTD on tetanus vaccine. She has 4 Covid-19 vaccines at this time. Diet: She is managing a healthy diet. Exercise: She participates in regular exercise. Colonoscopy: Last completed 04/23/2018. Results showed diverticulosis in left colon, otherwise results normal. Repeat in 5 years.  Dexa: Last completed 01/30/2017. Results normal.  Pap Smear: Last completed 05/20/2020. Results normal.  Mammogram: Last competed 05/27/2020 Dental: She is UTD on dental care. Vision: She is due for vision care.  Health Maintenance Due  Topic Date Due   Hepatitis C Screening  Never done   PNA vac Low Risk Adult (2 of 2 - PCV13) 05/20/2021     Past Medical History:  Diagnosis Date   Anemia    Anxiety    Arthritis    Cataract    bilat removed   Colon cancer ([place]) 2013   cecal cancer   COPD with emphysema ([place]) 06/18/2012   Depression    GERD (gastroesophageal reflux disease)    Neuromuscular disorder ([facility])    neuropathy from chemo   Peptic ulcer    Pneumonia    Shortness of breath dyspnea    with exertion    Past Surgical History:  Procedure Laterality Date   APPENDECTOMY  07/07/11   BREAST EXCISIONAL BIOPSY Right 03/15/2018   COLON RESECTION  2012   for colon cancer   COLONOSCOPY     EYE SURGERY Bilateral    cataract surgery with lens implant   heel spurs Right    hemrrhoids     PLANTAR FASCIA SURGERY Right 2009   PORT-A-CATH REMOVAL Left 04/19/2016   Procedure: REMOVAL PORT-A-CATH;  Surgeon: [name], MD;  Location: [facility];  Service: General;  Laterality: Left;   portacath     RADIOACTIVE SEED GUIDED EXCISIONAL BREAST BIOPSY Right 03/15/2018   Procedure: RADIOACTIVE SEED GUIDED EXCISIONAL BREAST BIOPSY;  Surgeon: [name], MD;  Location: [facility];  Service: General;  Laterality: Right;   TUBAL LIGATION  07/1980    Family History  Problem Relation Age of Onset   Diabetes Mother    Colon cancer Cousin    Colon cancer Cousin    Thalassemia Daughter  Colon polyps Neg Hx    Rectal cancer Neg Hx    Stomach cancer Neg Hx     Social History   Socioeconomic History   Marital status: Married    Spouse name: Not on file   Number of children: 3   Years of education: Not on file   Highest education level: Not on file  Occupational History   Occupation: Armed forces operational officer: [employer]  Tobacco Use   Smoking status: Every Day    Packs/day: 1.00    Years: 45.00    Pack years: 45.00    Types: Cigarettes    Start date: 04/23/1986   Smokeless tobacco: Never  Substance and Sexual Activity   Alcohol use: Yes    Alcohol/week: 0.0 standard drinks    Comment: social   Drug use:  No   Sexual activity: Not Currently    Birth control/protection: Surgical  Other Topics Concern   Not on file  Social History Narrative   3 children- all daughter- live locally   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, minerals   Married      Investment banker, operational of Radio broadcast assistant Strain: Not on Art therapist Insecurity: Not on Pensions consultant Needs: Not on file  Physical Activity: Not on file  Stress: Not on file  Social Connections: Not on file  Intimate Partner Violence: Not on file    Outpatient Medications Prior to Visit  Medication Sig Dispense Refill   albuterol (VENTOLIN HFA) 108 (90 Base) MCG/ACT inhaler INHALE 1 - 2 PUFFS INTO THE LUNGS EVERY 6 HOURS AS NEEDED FOR WHEEZING OR SHORTNESS OF BREATH 8.5 g 2   ALPRAZolam (XANAX) 1 MG tablet TAKE 1 TABLET BY MOUTH EVERY NIGHT AT BEDTIME AS NEEDED FOR ANXIETY OR SLEEP 30 tablet 2   Calcium-Vitamin D 600-125 MG-UNIT TABS Take 1 tablet by mouth daily.     cyclobenzaprine (FLEXERIL) 10 MG tablet Take 1 tablet by mouth at bedtime 30 tablet 6   famotidine (PEPCID) 20 MG tablet Take 40 mg by mouth 2 (two) times daily.     fluticasone furoate-vilanterol (BREO ELLIPTA) 100-25 MCG/INH AEPB INHALE 1 PUFF INTO THE LUNGS DAILY 60 each 6   gabapentin (NEURONTIN) 800 MG tablet TAKE 1 TABLET BY MOUTH EVERY 4 HOURS AS NEEDED 120 tablet 1   influenza vaccine adjuvanted (FLUAD) 0.5 ML injection TAKE AS DIRECTED .5 mL 0   lidocaine (LIDODERM) 5 % PLACE 1 PATCH ONTO THE SKIN DAILY. REMOVE & DISCARD PATCH WITHIN 12 HOURS OR AS DIRECTED BY MD 30 patch 1   Multiple Vitamin (MULTIVITAMIN) tablet Take 1 tablet by mouth daily.     oxyCODONE-acetaminophen (PERCOCET) 10-325 MG tablet TAKE 1 TABLET BY MOUTH 4 TIMES DAILY AS NEEDED 120 tablet 0   Pyridoxine HCl (VITAMIN B-6) 250 MG tablet Take 1 tablet (250 mg total) by mouth daily. 30 tablet 12   TURMERIC PO Take 1 tablet by mouth daily.     vitamin E 200 UNIT capsule Take 200 Units by mouth daily.      gabapentin (NEURONTIN) 800 MG tablet TAKE 1 TABLET BY MOUTH 4 TIMES DAILY AS NEEDED 120 tablet 1   oxyCODONE-acetaminophen (PERCOCET) 10-325 MG tablet TAKE 1 TABLET BY MOUTH 4 TIMES DAILY 120 tablet 0   oxyCODONE-acetaminophen (PERCOCET) 10-325 MG tablet TAKE 1 TABLET BY MOUTH 4 TIMES DAILY AS NEEDED **12/05/20** 120 tablet 0   oxyCODONE-acetaminophen (PERCOCET) 10-325 MG tablet Take 1 (one) tablet by  mouth four times daily, as needed 120 tablet 0   vortioxetine HBr (TRINTELLIX) 5 MG TABS tablet TAKE 1 TABLET BY MOUTH ONCE DAILY 90 tablet 1   COVID-19 mRNA vaccine, Moderna, (MODERNA COVID-19 VACCINE) 100 MCG/0.5ML injection Inject into the muscle. (Patient not taking: Reported on 05/26/2021) 0.25 mL 0   No facility-administered medications prior to visit.    Allergies  Allergen Reactions   Aspirin     Upset stomach   Cymbalta [Duloxetine Hcl] Swelling    Swollen lips    Prozac [Fluoxetine Hcl] Swelling    Lip swelling   Wellbutrin [Bupropion] Swelling    Lip swelling    Review of Systems  Constitutional:        (-)unexpected weight change (-)Adenopathy  HENT:  Negative for hearing loss.        (-)Rhinorrhea   Eyes:        (-)Visual disturbance  Respiratory:  Negative for cough.   Cardiovascular:  Negative for chest pain and leg swelling.  Gastrointestinal:  Negative for blood in stool, constipation, diarrhea, nausea and vomiting.  Genitourinary:  Negative for dysuria and frequency.  Musculoskeletal:  Negative for joint pain and myalgias.  Skin:  Negative for rash.      Objective:    Physical Exam Constitutional:      General: She is not in acute distress.    Appearance: Normal appearance. She is not ill-appearing.  HENT:     Head: Normocephalic and atraumatic.     Right Ear: Tympanic membrane, ear canal and external ear normal.     Left Ear: Tympanic membrane, ear canal and external ear normal.  Eyes:     Extraocular Movements: Extraocular movements intact.      Pupils: Pupils are equal, round, and reactive to light.     Comments: No nystagmus  Cardiovascular:     Rate and Rhythm: Normal rate and regular rhythm.     Pulses: Normal pulses.     Heart sounds: Normal heart sounds. No murmur heard.   No gallop.  Pulmonary:     Effort: Pulmonary effort is normal. No respiratory distress.     Breath sounds: Normal breath sounds. No wheezing, rhonchi or rales.  Abdominal:     General: Bowel sounds are normal. There is no distension.     Palpations: Abdomen is soft.     Tenderness: There is no abdominal tenderness. There is no guarding or rebound.  Musculoskeletal:     Comments: 5/5 strength in both upper and lower extremities  Lymphadenopathy:     Cervical: No cervical adenopathy.  Neurological:     Mental Status: She is alert and oriented to person, place, and time.     Deep Tendon Reflexes:     Reflex Scores:      Patellar reflexes are 2+ on the right side and 2+ on the left side. Psychiatric:        Behavior: Behavior normal.        Judgment: Judgment normal.    BP 122/80 (BP Location: Left Arm, Patient Position: Sitting, Cuff Size: Normal)   Pulse 93   Temp 99 F (37.2 C) (Oral)   Resp 18   Ht '5\' 8"'  (1.727 m)   Wt 151 lb 3.2 oz (68.6 kg)   SpO2 96%   BMI 22.99 kg/m  Wt Readings from Last 3 Encounters:  05/26/21 151 lb 3.2 oz (68.6 kg)  01/07/21 155 lb (70.3 kg)  11/24/20 157 lb (71.2 kg)  Assessment & Plan:   Problem List Items Addressed This Visit       Unprioritized   Preventative health care - Primary    Continue healthy diet, exercise.  Pap up to date.  Recommended that she schedule vision exam.  Colo due 2024.        Relevant Orders   MM 3D SCREEN BREAST BILATERAL   Anxiety and depression    Stable on trintellix 84m once daily.        Relevant Medications   vortioxetine HBr (TRINTELLIX) 5 MG TABS tablet   hydrOXYzine (VISTARIL) 25 MG capsule   Allergic reaction to insect bite    Recurrent. Will rx with  atarax prn itching and provide an rx for epipen. She is advised only to use if tongue/lip swelling/airway closing.  She is advised to call 911 if she needs to use epipen.        Other Visit Diagnoses     Hyperglycemia       Relevant Orders   Comp Met (CMET)   Hemoglobin A1c   Hyperlipidemia, unspecified hyperlipidemia type       Relevant Medications   EPINEPHrine (EPIPEN 2-PAK) 0.3 mg/0.3 mL IJ SOAJ injection   Other Relevant Orders   Lipid panel        Meds ordered this encounter  Medications   vortioxetine HBr (TRINTELLIX) 5 MG TABS tablet    Sig: TAKE 1 TABLET BY MOUTH ONCE DAILY    Dispense:  90 tablet    Refill:  1   EPINEPHrine (EPIPEN 2-PAK) 0.3 mg/0.3 mL IJ SOAJ injection    Sig: Inject 0.3 mg into the muscle as needed for anaphylaxis.    Dispense:  2 each    Refill:  0    Order Specific Question:   Supervising Provider    Answer:   BPenni HomansA [[id number]]   hydrOXYzine (VISTARIL) 25 MG capsule    Sig: Take 1 capsule (25 mg total) by mouth every 8 (eight) hours as needed.    Dispense:  30 capsule    Refill:  0    Order Specific Question:   Supervising Provider    Answer:   BPenni HomansA [[id number]]    I, MDebbrah AlarNP, personally preformed the services described in this documentation.  All medical record entries made by the scribe were at my direction and in my presence.  I have reviewed the chart and discharge instructions (if applicable) and agree that the record reflects my personal performance and is accurate and complete. 05/26/2021   I,Shehryar Baig,acting as a sEducation administratorfor MNance Pear NP.,have documented all relevant documentation on the behalf of MNance Pear NP,as directed by  MNance Pear NP while in the presence of MNance Pear NP.   MNance Pear NP

## 2021-05-26 NOTE — Patient Instructions (Addendum)
Please schedule follow up vision exam. Complete lab work prior to leaving.

## 2021-05-26 NOTE — Assessment & Plan Note (Signed)
Continue healthy diet, exercise.  Pap up to date.  Recommended that she schedule vision exam.  Colo due 2024.

## 2021-05-27 ENCOUNTER — Other Ambulatory Visit (HOSPITAL_BASED_OUTPATIENT_CLINIC_OR_DEPARTMENT_OTHER): Payer: Self-pay

## 2021-05-27 ENCOUNTER — Encounter: Payer: Self-pay | Admitting: Family

## 2021-05-27 ENCOUNTER — Telehealth: Payer: Self-pay | Admitting: Family

## 2021-05-27 DIAGNOSIS — E785 Hyperlipidemia, unspecified: Secondary | ICD-10-CM

## 2021-05-27 HISTORY — DX: Hyperlipidemia, unspecified: E78.5

## 2021-05-27 MED ORDER — ATORVASTATIN CALCIUM 20 MG PO TABS
20.0000 mg | ORAL_TABLET | Freq: Every day | ORAL | 1 refills | Status: DC
  Filled 2021-05-27: qty 90, 90d supply, fill #0
  Filled 2021-09-01: qty 90, 90d supply, fill #1

## 2021-05-27 NOTE — Telephone Encounter (Signed)
Called pt and labs reviewed , lab appointment made

## 2021-05-27 NOTE — Telephone Encounter (Signed)
Sugar has risen slightly and is currently in the borderline diabetes range.  Cholesterol is above goal. I would recommend that she add atorvastatin 20mg  daily (cholesterol medicine) to decrease her risk of heart attack and stroke.  Sugar is in the borderline diabetes range. Please continue to work on reduced carb diet.  Repeat lipid panel in 6 weeks, dx hyperlipidemia.

## 2021-06-01 ENCOUNTER — Other Ambulatory Visit: Payer: Self-pay | Admitting: Family

## 2021-06-01 ENCOUNTER — Other Ambulatory Visit: Payer: Self-pay | Admitting: Hematology & Oncology

## 2021-06-01 ENCOUNTER — Other Ambulatory Visit: Payer: Self-pay | Admitting: Adult Health

## 2021-06-01 ENCOUNTER — Other Ambulatory Visit: Payer: Self-pay | Admitting: *Deleted

## 2021-06-01 ENCOUNTER — Other Ambulatory Visit (HOSPITAL_BASED_OUTPATIENT_CLINIC_OR_DEPARTMENT_OTHER): Payer: Self-pay

## 2021-06-01 DIAGNOSIS — J430 Unilateral pulmonary emphysema [MacLeod's syndrome]: Secondary | ICD-10-CM

## 2021-06-01 DIAGNOSIS — G629 Polyneuropathy, unspecified: Secondary | ICD-10-CM

## 2021-06-01 DIAGNOSIS — C180 Malignant neoplasm of cecum: Secondary | ICD-10-CM

## 2021-06-01 MED ORDER — ALPRAZOLAM 1 MG PO TABS
ORAL_TABLET | ORAL | 2 refills | Status: DC
  Filled 2021-06-01: qty 30, 30d supply, fill #0
  Filled 2021-06-30: qty 30, 30d supply, fill #1
  Filled 2021-07-28: qty 30, 30d supply, fill #2

## 2021-06-01 MED ORDER — HYDROXYZINE PAMOATE 25 MG PO CAPS
25.0000 mg | ORAL_CAPSULE | Freq: Three times a day (TID) | ORAL | 0 refills | Status: DC | PRN
  Filled 2021-06-01 – 2021-07-09 (×3): qty 30, 10d supply, fill #0

## 2021-06-01 MED ORDER — ALBUTEROL SULFATE HFA 108 (90 BASE) MCG/ACT IN AERS
INHALATION_SPRAY | RESPIRATORY_TRACT | 2 refills | Status: DC
  Filled 2021-06-01: qty 8.5, 25d supply, fill #0
  Filled 2021-08-18: qty 8.5, 25d supply, fill #1
  Filled 2021-09-20: qty 8.5, 25d supply, fill #2

## 2021-06-01 MED FILL — Fluticasone Furoate-Vilanterol Aero Powd BA 100-25 MCG/ACT: RESPIRATORY_TRACT | 30 days supply | Qty: 60 | Fill #1 | Status: AC

## 2021-06-03 ENCOUNTER — Other Ambulatory Visit (HOSPITAL_BASED_OUTPATIENT_CLINIC_OR_DEPARTMENT_OTHER): Payer: Self-pay

## 2021-06-11 ENCOUNTER — Other Ambulatory Visit (HOSPITAL_BASED_OUTPATIENT_CLINIC_OR_DEPARTMENT_OTHER): Payer: Self-pay

## 2021-06-22 ENCOUNTER — Ambulatory Visit (HOSPITAL_BASED_OUTPATIENT_CLINIC_OR_DEPARTMENT_OTHER): Payer: Medicare Other

## 2021-06-22 ENCOUNTER — Other Ambulatory Visit (HOSPITAL_BASED_OUTPATIENT_CLINIC_OR_DEPARTMENT_OTHER): Payer: Self-pay

## 2021-06-22 DIAGNOSIS — Z79899 Other long term (current) drug therapy: Secondary | ICD-10-CM | POA: Diagnosis not present

## 2021-06-22 DIAGNOSIS — M5441 Lumbago with sciatica, right side: Secondary | ICD-10-CM | POA: Diagnosis not present

## 2021-06-22 DIAGNOSIS — G8929 Other chronic pain: Secondary | ICD-10-CM | POA: Diagnosis not present

## 2021-06-22 MED ORDER — OXYCODONE-ACETAMINOPHEN 10-325 MG PO TABS
ORAL_TABLET | ORAL | 0 refills | Status: DC
  Filled 2021-06-22: qty 120, 30d supply, fill #0

## 2021-06-25 DIAGNOSIS — Z79899 Other long term (current) drug therapy: Secondary | ICD-10-CM | POA: Diagnosis not present

## 2021-06-30 ENCOUNTER — Other Ambulatory Visit (HOSPITAL_BASED_OUTPATIENT_CLINIC_OR_DEPARTMENT_OTHER): Payer: Self-pay

## 2021-06-30 IMAGING — MG DIGITAL SCREENING BILAT W/ TOMO W/ CAD
6 of 12 series · 6 of 36 positions shown · non-contrast
Comparison: Previous exam(s).

CLINICAL DATA: Screening.

EXAM:
DIGITAL SCREENING BILATERAL MAMMOGRAM WITH TOMO AND CAD

[L CC synth-2D]
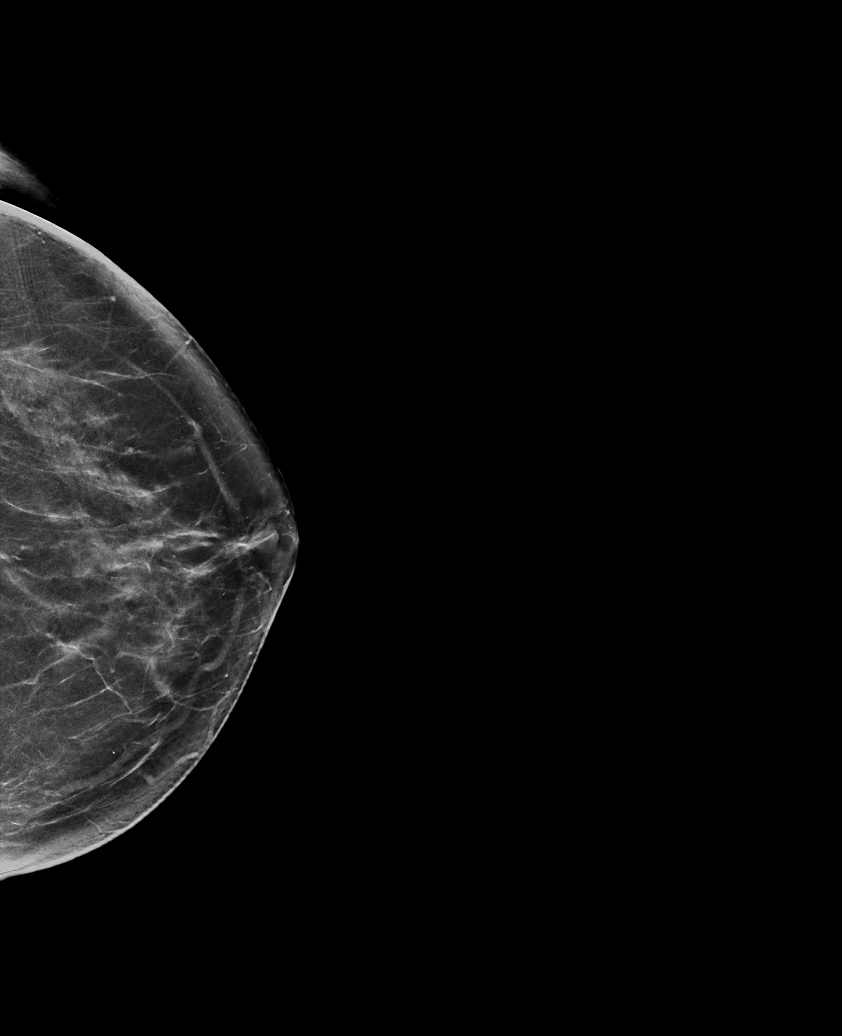

[R MLO synth-2D]
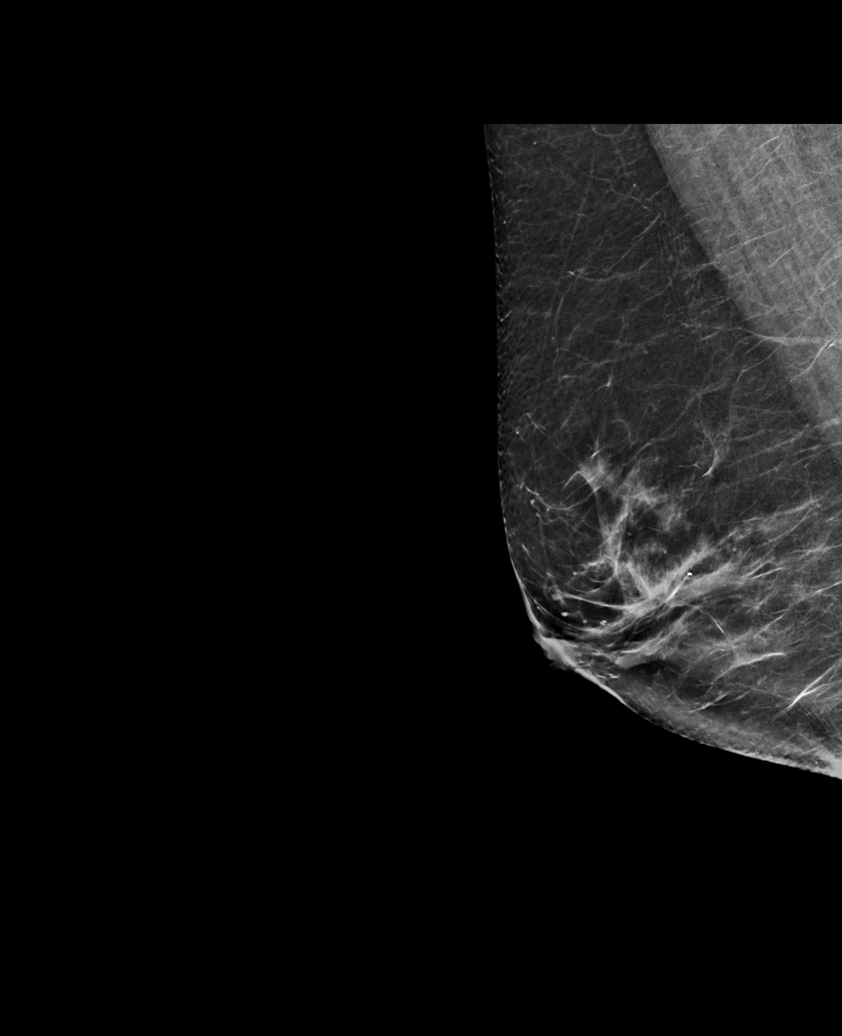

[L XCCL synth-2D]
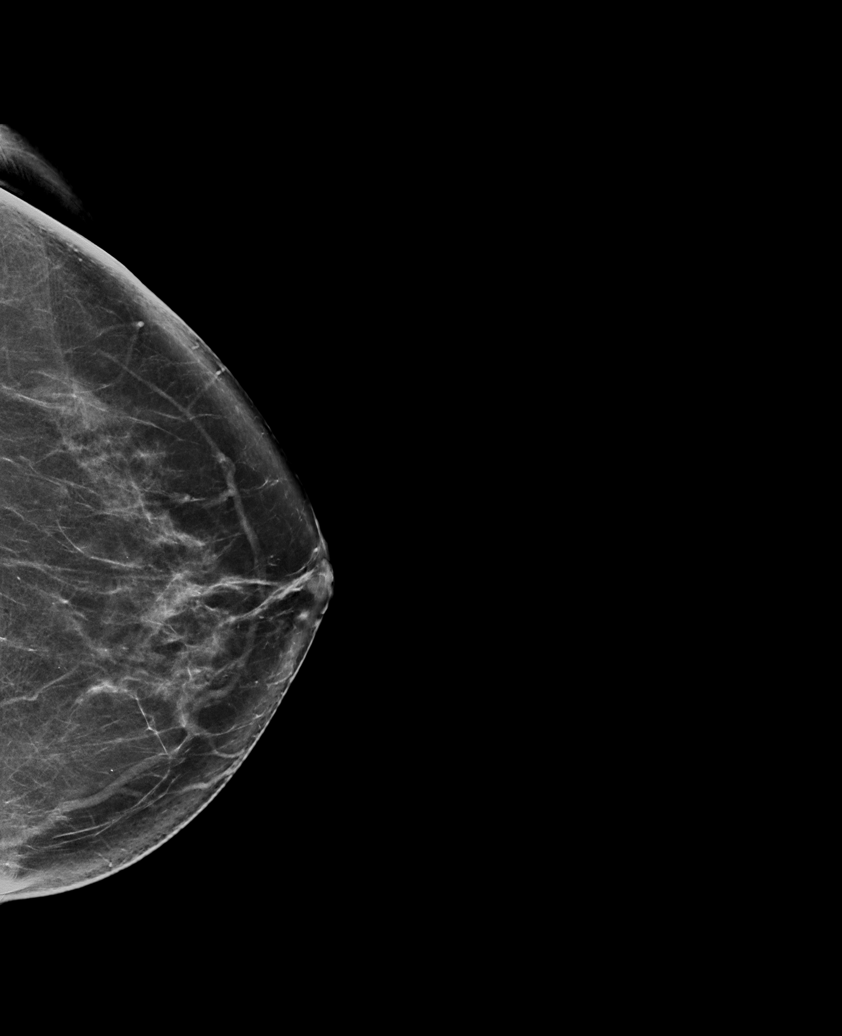

[R XCCL synth-2D]
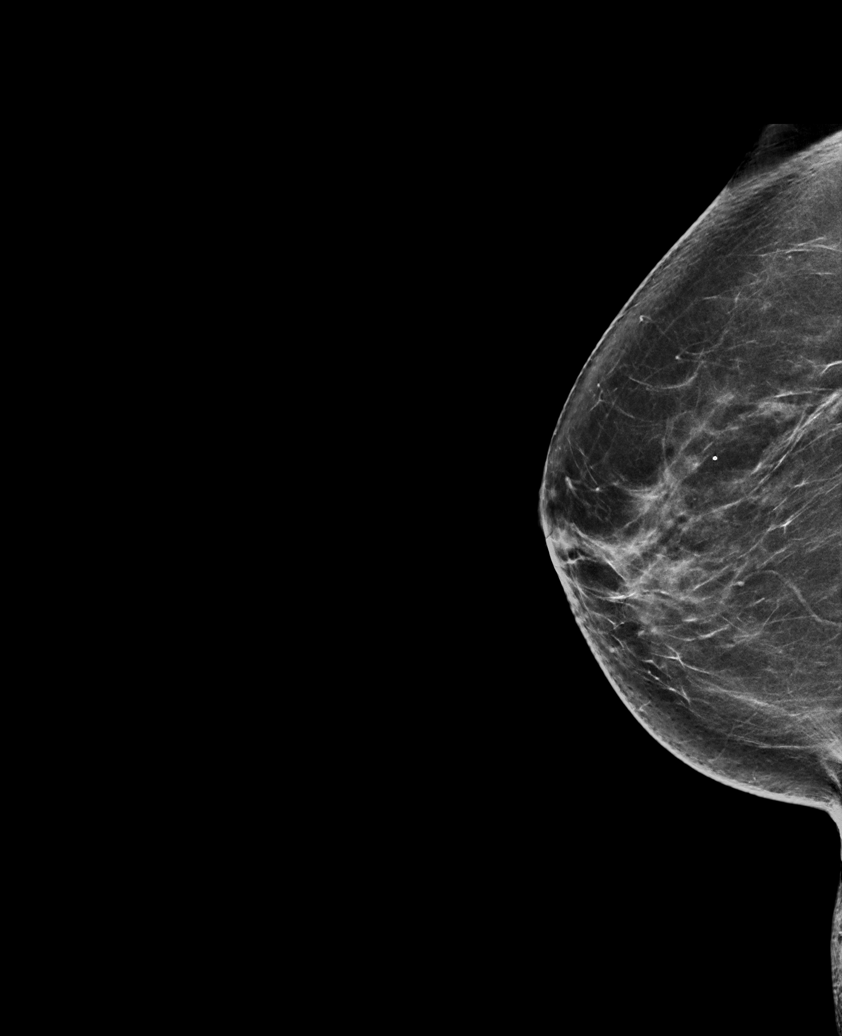

[R CC synth-2D]
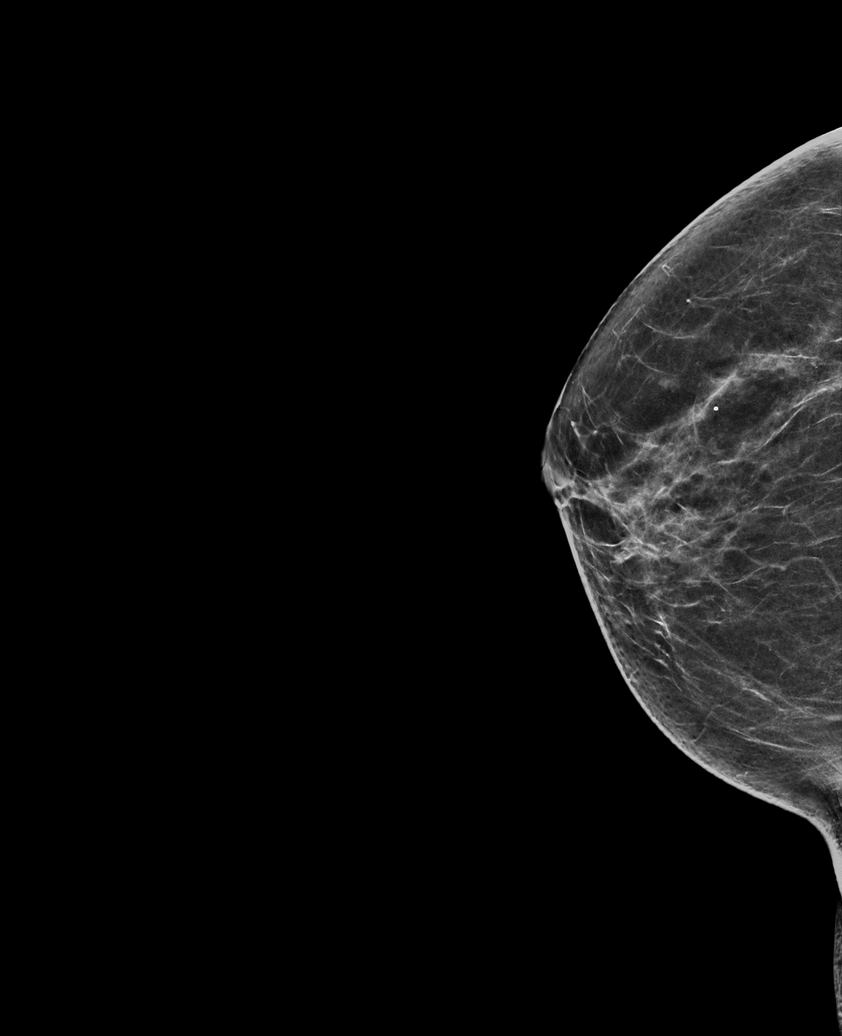

[L MLO synth-2D]
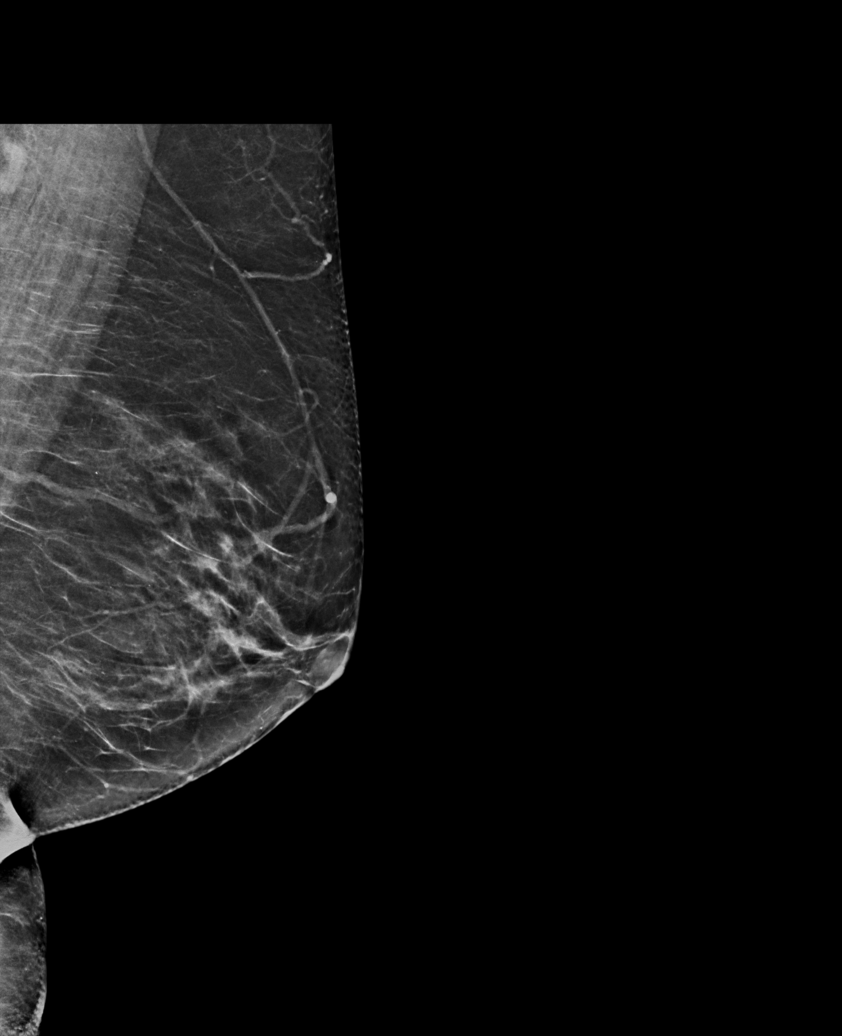

[6 of 36 positions shown; findings below may reference images not displayed]

ACR Breast Density Category c: The breast tissue is heterogeneously
dense, which may obscure small masses.
FINDINGS: There are no findings suspicious for malignancy. Images were
processed with CAD.
IMPRESSION: No mammographic evidence of malignancy. A result letter of this
screening mammogram will be mailed directly to the patient.

RECOMMENDATION:
Screening mammogram in one year. (Code:[id number])

BI-RADS CATEGORY  1: Negative.

## 2021-06-30 MED FILL — Fluticasone Furoate-Vilanterol Aero Powd BA 100-25 MCG/ACT: RESPIRATORY_TRACT | 30 days supply | Qty: 60 | Fill #2 | Status: AC

## 2021-07-06 ENCOUNTER — Other Ambulatory Visit (INDEPENDENT_AMBULATORY_CARE_PROVIDER_SITE_OTHER): Payer: Medicare Other

## 2021-07-06 ENCOUNTER — Other Ambulatory Visit: Payer: Self-pay

## 2021-07-06 DIAGNOSIS — E785 Hyperlipidemia, unspecified: Secondary | ICD-10-CM | POA: Diagnosis not present

## 2021-07-07 LAB — LIPID PANEL
Cholesterol: 129 mg/dL (ref 0–200)
HDL: 39.4 mg/dL (ref >39.00)
LDL Cholesterol: 70 mg/dL (ref 0–99)
NonHDL: 89.93
Total CHOL/HDL Ratio: 3
Triglycerides: 100 mg/dL (ref 0.0–149.0)
VLDL: 20 mg/dL (ref 0.0–40.0)

## 2021-07-09 ENCOUNTER — Other Ambulatory Visit (HOSPITAL_BASED_OUTPATIENT_CLINIC_OR_DEPARTMENT_OTHER): Payer: Self-pay

## 2021-07-13 ENCOUNTER — Encounter (HOSPITAL_BASED_OUTPATIENT_CLINIC_OR_DEPARTMENT_OTHER): Payer: Self-pay

## 2021-07-13 ENCOUNTER — Ambulatory Visit (HOSPITAL_BASED_OUTPATIENT_CLINIC_OR_DEPARTMENT_OTHER)
Admission: RE | Admit: 2021-07-13 | Discharge: 2021-07-13 | Disposition: A | Discharge status: Home / Self Care | Payer: Medicare Other | Source: Ambulatory Visit | Attending: Family | Admitting: Family

## 2021-07-13 ENCOUNTER — Other Ambulatory Visit: Payer: Self-pay

## 2021-07-13 DIAGNOSIS — Z0000 Encounter for general adult medical examination without abnormal findings: Secondary | ICD-10-CM

## 2021-07-13 DIAGNOSIS — Z1231 Encounter for screening mammogram for malignant neoplasm of breast: Secondary | ICD-10-CM | POA: Insufficient documentation

## 2021-07-20 ENCOUNTER — Other Ambulatory Visit (HOSPITAL_BASED_OUTPATIENT_CLINIC_OR_DEPARTMENT_OTHER): Payer: Self-pay

## 2021-07-20 DIAGNOSIS — Z79899 Other long term (current) drug therapy: Secondary | ICD-10-CM | POA: Diagnosis not present

## 2021-07-20 DIAGNOSIS — M545 Low back pain, unspecified: Secondary | ICD-10-CM | POA: Diagnosis not present

## 2021-07-20 DIAGNOSIS — F17200 Nicotine dependence, unspecified, uncomplicated: Secondary | ICD-10-CM | POA: Diagnosis not present

## 2021-07-20 MED ORDER — OXYCODONE-ACETAMINOPHEN 10-325 MG PO TABS
ORAL_TABLET | ORAL | 0 refills | Status: DC
  Filled 2021-07-20: qty 120, 30d supply, fill #0

## 2021-07-20 MED ORDER — CYCLOBENZAPRINE HCL 10 MG PO TABS
10.0000 mg | ORAL_TABLET | Freq: Every day | ORAL | 6 refills | Status: DC
  Filled 2021-07-20 – 2021-07-28 (×2): qty 30, 30d supply, fill #0
  Filled 2021-08-24: qty 30, 30d supply, fill #1
  Filled 2021-09-24: qty 30, 30d supply, fill #2

## 2021-07-21 ENCOUNTER — Other Ambulatory Visit (HOSPITAL_BASED_OUTPATIENT_CLINIC_OR_DEPARTMENT_OTHER): Payer: Self-pay

## 2021-07-23 ENCOUNTER — Other Ambulatory Visit: Payer: Self-pay

## 2021-07-23 ENCOUNTER — Encounter: Payer: Self-pay | Admitting: Hematology & Oncology

## 2021-07-23 ENCOUNTER — Other Ambulatory Visit (HOSPITAL_BASED_OUTPATIENT_CLINIC_OR_DEPARTMENT_OTHER): Payer: Self-pay

## 2021-07-23 ENCOUNTER — Inpatient Hospital Stay: Payer: Medicare Other | Attending: Hematology & Oncology | Admitting: Hematology & Oncology

## 2021-07-23 ENCOUNTER — Inpatient Hospital Stay: Payer: Medicare Other

## 2021-07-23 VITALS — BP 129/76 | HR 91 | Temp 98.3°F | Resp 16 | Ht 69.0 in | Wt 149.0 lb

## 2021-07-23 DIAGNOSIS — C180 Malignant neoplasm of cecum: Secondary | ICD-10-CM | POA: Insufficient documentation

## 2021-07-23 DIAGNOSIS — Z8616 Personal history of COVID-19: Secondary | ICD-10-CM | POA: Diagnosis not present

## 2021-07-23 DIAGNOSIS — G620 Drug-induced polyneuropathy: Secondary | ICD-10-CM | POA: Diagnosis not present

## 2021-07-23 DIAGNOSIS — F17210 Nicotine dependence, cigarettes, uncomplicated: Secondary | ICD-10-CM | POA: Insufficient documentation

## 2021-07-23 DIAGNOSIS — T451X5A Adverse effect of antineoplastic and immunosuppressive drugs, initial encounter: Secondary | ICD-10-CM | POA: Diagnosis not present

## 2021-07-23 DIAGNOSIS — Z79899 Other long term (current) drug therapy: Secondary | ICD-10-CM | POA: Diagnosis not present

## 2021-07-23 LAB — CBC WITH DIFFERENTIAL (CANCER CENTER ONLY)
Abs Immature Granulocytes: 0.03 10*3/uL (ref 0.00–0.07)
Basophils Absolute: 0 10*3/uL (ref 0.0–0.1)
Basophils Relative: 0 %
Eosinophils Absolute: 0.1 10*3/uL (ref 0.0–0.5)
Eosinophils Relative: 1 %
HCT: 43.7 % (ref 36.0–46.0)
Hemoglobin: 13.8 g/dL (ref 12.0–15.0)
Immature Granulocytes: 0 %
Lymphocytes Relative: 20 %
Lymphs Abs: 1.7 10*3/uL (ref 0.7–4.0)
MCH: 23.4 pg — ABNORMAL LOW (ref 26.0–34.0)
MCHC: 31.6 g/dL (ref 30.0–36.0)
MCV: 74.1 fL — ABNORMAL LOW (ref 80.0–100.0)
Monocytes Absolute: 0.4 10*3/uL (ref 0.1–1.0)
Monocytes Relative: 5 %
Neutro Abs: 6.4 10*3/uL (ref 1.7–7.7)
Neutrophils Relative %: 74 %
Platelet Count: 151 10*3/uL (ref 150–400)
RBC: 5.9 MIL/uL — ABNORMAL HIGH (ref 3.87–5.11)
RDW: 15.2 % (ref 11.5–15.5)
WBC Count: 8.6 10*3/uL (ref 4.0–10.5)
nRBC: 0 % (ref 0.0–0.2)

## 2021-07-23 LAB — CMP (CANCER CENTER ONLY)
ALT: 16 U/L (ref 0–44)
AST: 16 U/L (ref 15–41)
Albumin: 4.3 g/dL (ref 3.5–5.0)
Alkaline Phosphatase: 85 U/L (ref 38–126)
Anion gap: 6 (ref 5–15)
BUN: 8 mg/dL (ref 8–23)
CO2: 30 mmol/L (ref 22–32)
Calcium: 9.2 mg/dL (ref 8.9–10.3)
Chloride: 102 mmol/L (ref 98–111)
Creatinine: 0.9 mg/dL (ref 0.44–1.00)
GFR, Estimated: >60 mL/min (ref >60)
Glucose, Bld: 88 mg/dL (ref 70–99)
Potassium: 4.1 mmol/L (ref 3.5–5.1)
Sodium: 138 mmol/L (ref 135–145)
Total Bilirubin: 0.5 mg/dL (ref 0.3–1.2)
Total Protein: 6.8 g/dL (ref 6.5–8.1)

## 2021-07-23 NOTE — Progress Notes (Signed)
Hematology and Oncology Follow Up Visit  HIDIE TKACZYK [id number] June 28, 1956 65 y.o. 07/23/2021   Principle Diagnosis:  1. Stage IIIB (T2 N2 M0) adenocarcinoma of the cecum. 2. Neuropathy secondary to chemotherapy.  Current Therapy:   Observation     Interim History:  Ms.  Rieker is back for followup.  We see her yearly.  So far, she is done quite well.  She did have COVID earlier in the year.  This was after she came back from the beach.  Thankfully, she had very few symptoms.  She was not hospitalized.  I will think she needed any oral medication for it.  She is still smoking.  She is trying to cut back on this.  When we saw your ago, her CEA was 7.  This is holding steady.  I am sure that the CEA is low but elevated because of the smoking.  She had a mammogram done a week ago.  Everything looked good with the mammogram.  She has had no problems with nausea or vomiting.  She has had no problems with cough or shortness of breath.  She has had no issues with rashes.  She has had no fever.  She has had a little bit of a headache.  She had this in the morning.  She and her family will be going on a cruise I think in the fall to the [place].  Overall, her performance status is ECOG 0.    Medications:  Current Outpatient Medications:    albuterol (VENTOLIN HFA) 108 (90 Base) MCG/ACT inhaler, INHALE 1 - 2 PUFFS INTO THE LUNGS EVERY 6 HOURS AS NEEDED FOR WHEEZING OR SHORTNESS OF BREATH, Disp: 8.5 g, Rfl: 2   ALPRAZolam (XANAX) 1 MG tablet, TAKE 1 TABLET BY MOUTH EVERY NIGHT AT BEDTIME AS NEEDED FOR ANXIETY OR SLEEP, Disp: 30 tablet, Rfl: 2   atorvastatin (LIPITOR) 20 MG tablet, Take 1 tablet (20 mg total) by mouth daily., Disp: 90 tablet, Rfl: 1   Calcium-Vitamin D 600-125 MG-UNIT TABS, Take 1 tablet by mouth daily., Disp: , Rfl:    cyclobenzaprine (FLEXERIL) 10 MG tablet, Take 1 tablet by mouth at bedtime, Disp: 30 tablet, Rfl: 6   EPINEPHrine (EPIPEN 2-PAK) 0.3 mg/0.3 mL IJ SOAJ  injection, Inject 0.3 mg into the muscle as needed for anaphylaxis., Disp: 2 each, Rfl: 0   famotidine (PEPCID) 20 MG tablet, Take 40 mg by mouth 2 (two) times daily., Disp: , Rfl:    fluticasone furoate-vilanterol (BREO ELLIPTA) 100-25 MCG/INH AEPB, INHALE 1 PUFF INTO THE LUNGS DAILY, Disp: 60 each, Rfl: 6   hydrOXYzine (VISTARIL) 25 MG capsule, Take 1 capsule (25 mg total) by mouth every 8 (eight) hours as needed., Disp: 30 capsule, Rfl: 0   influenza vaccine adjuvanted (FLUAD) 0.5 ML injection, TAKE AS DIRECTED, Disp: .5 mL, Rfl: 0   lidocaine (LIDODERM) 5 %, PLACE 1 PATCH ONTO THE SKIN DAILY. REMOVE & DISCARD PATCH WITHIN 12 HOURS OR AS DIRECTED BY MD, Disp: 30 patch, Rfl: 1   Multiple Vitamin (MULTIVITAMIN) tablet, Take 1 tablet by mouth daily., Disp: , Rfl:    oxyCODONE-acetaminophen (PERCOCET) 10-325 MG tablet, 1 (one) Tablet by mouth four times daily, as needed, Disp: 120 tablet, Rfl: 0   Pyridoxine HCl (VITAMIN B-6) 250 MG tablet, Take 1 tablet (250 mg total) by mouth daily., Disp: 30 tablet, Rfl: 12   TURMERIC PO, Take 1 tablet by mouth daily., Disp: , Rfl:    vitamin E 200 UNIT capsule,  Take 200 Units by mouth daily., Disp: , Rfl:    vortioxetine HBr (TRINTELLIX) 5 MG TABS tablet, TAKE 1 TABLET BY MOUTH ONCE DAILY, Disp: 90 tablet, Rfl: 1  Allergies:  Allergies  Allergen Reactions   Aspirin     Upset stomach   Cymbalta [Duloxetine Hcl] Swelling    Swollen lips    Prozac [Fluoxetine Hcl] Swelling    Lip swelling   Wellbutrin [Bupropion] Swelling    Lip swelling    Past Medical History, Surgical history, Social history, and Family History were reviewed and updated.  Review of Systems: Review of Systems  Constitutional: Negative.   HENT: Negative.    Eyes: Negative.   Respiratory: Negative.    Cardiovascular: Negative.   Gastrointestinal:  Positive for nausea.  Genitourinary: Negative.   Musculoskeletal: Negative.   Skin: Negative.   Neurological:  Positive for  tingling.  Endo/Heme/Allergies: Negative.   Psychiatric/Behavioral: Negative.     Physical Exam:  height is '5\' 9"'$  (1.753 m) and weight is 149 lb (67.6 kg). Her oral temperature is 98.3 F (36.8 C). Her blood pressure is 129/76 and her pulse is 91. Her respiration is 16 and oxygen saturation is 97%.   Physical Exam Vitals reviewed.  HENT:     Head: Normocephalic and atraumatic.  Eyes:     Pupils: Pupils are equal, round, and reactive to light.  Cardiovascular:     Rate and Rhythm: Normal rate and regular rhythm.     Heart sounds: Normal heart sounds.  Pulmonary:     Effort: Pulmonary effort is normal.     Breath sounds: Normal breath sounds.  Abdominal:     General: Bowel sounds are normal.     Palpations: Abdomen is soft.  Musculoskeletal:        General: No tenderness or deformity. Normal range of motion.     Cervical back: Normal range of motion.  Lymphadenopathy:     Cervical: No cervical adenopathy.  Skin:    General: Skin is warm and dry.     Findings: No erythema or rash.  Neurological:     Mental Status: She is alert and oriented to person, place, and time.  Psychiatric:        Behavior: Behavior normal.        Thought Content: Thought content normal.        Judgment: Judgment normal.     Lab Results  Component Value Date   WBC 8.6 07/23/2021   HGB 13.8 07/23/2021   HCT 43.7 07/23/2021   MCV 74.1 (L) 07/23/2021   PLT 151 07/23/2021     Chemistry      Component Value Date/Time   NA 138 07/23/2021 1124   NA 141 07/24/2017 1147   NA 142 09/21/2015 1306   K 4.1 07/23/2021 1124   K 3.3 07/24/2017 1147   K 3.8 09/21/2015 1306   CL 102 07/23/2021 1124   CL 103 07/24/2017 1147   CO2 30 07/23/2021 1124   CO2 29 07/24/2017 1147   CO2 27 09/21/2015 1306   BUN 8 07/23/2021 1124   BUN 8 07/24/2017 1147   BUN 9.8 09/21/2015 1306   CREATININE 0.90 07/23/2021 1124   CREATININE 0.9 07/24/2017 1147   CREATININE 0.8 09/21/2015 1306      Component Value  Date/Time   CALCIUM 9.2 07/23/2021 1124   CALCIUM 9.0 07/24/2017 1147   CALCIUM 9.2 09/21/2015 1306   ALKPHOS 85 07/23/2021 1124   ALKPHOS 81 07/24/2017 1147  ALKPHOS 77 09/21/2015 1306   AST 16 07/23/2021 1124   AST 15 09/21/2015 1306   ALT 16 07/23/2021 1124   ALT 20 07/24/2017 1147   ALT 13 09/21/2015 1306   BILITOT 0.5 07/23/2021 1124   BILITOT 0.40 09/21/2015 1306      Impression and Plan: Ms. Volle is 65 year-old female with stage IIIB colon cancer. This was of the cecum. She underwent resection back in September of 2012. She had 4 positive lymph nodes. She completed chemotherapy in March of 2013.  I doubt that colon cancer recurrence will ever be a problem for her.  With her smoking, one has to worry about the possibility of lung cancer.  We will now get her back in 1 year   I am just happy that her quality of life is doing well.  She is very active.  She really enjoys her grandchildren.  Volanda Napoleon, MD 9/9/202212:48 PM

## 2021-07-26 ENCOUNTER — Telehealth: Payer: Self-pay

## 2021-07-26 ENCOUNTER — Other Ambulatory Visit: Payer: Self-pay | Admitting: Hematology & Oncology

## 2021-07-26 DIAGNOSIS — C180 Malignant neoplasm of cecum: Secondary | ICD-10-CM

## 2021-07-26 LAB — CEA (IN HOUSE-CHCC): CEA (CHCC-In House): 9.02 ng/mL — ABNORMAL HIGH (ref 0.00–5.00)

## 2021-07-26 NOTE — Telephone Encounter (Signed)
-----   Message from Volanda Napoleon, MD sent at 07/26/2021  1:16 PM EDT ----- Call - the tumor marker level is up a little bit more.  I think it is form smoking, but we need to do a CT scan to make sure that there is no cancer.  Please stop smoking.  Laurey Arrow

## 2021-07-26 NOTE — Telephone Encounter (Signed)
Called patient with lab results and pt educated that a CT scan is needed to rule out any cancer. Pt aware that at this time Dr. Marin Olp thinks it is from her smoking. Pt verbalized understanding and had no further questions. Pt aware that scheduling will call. Pt encouraged to stop smoking and understanding verbalized.

## 2021-07-28 ENCOUNTER — Other Ambulatory Visit (HOSPITAL_BASED_OUTPATIENT_CLINIC_OR_DEPARTMENT_OTHER): Payer: Self-pay

## 2021-08-03 ENCOUNTER — Other Ambulatory Visit: Payer: Self-pay

## 2021-08-03 ENCOUNTER — Ambulatory Visit (HOSPITAL_BASED_OUTPATIENT_CLINIC_OR_DEPARTMENT_OTHER)
Admission: RE | Admit: 2021-08-03 | Discharge: 2021-08-03 | Disposition: A | Discharge status: Home / Self Care | Payer: Medicare Other | Source: Ambulatory Visit | Attending: Hematology & Oncology | Admitting: Hematology & Oncology

## 2021-08-03 DIAGNOSIS — J432 Centrilobular emphysema: Secondary | ICD-10-CM | POA: Diagnosis not present

## 2021-08-03 DIAGNOSIS — I2510 Atherosclerotic heart disease of native coronary artery without angina pectoris: Secondary | ICD-10-CM | POA: Diagnosis not present

## 2021-08-03 DIAGNOSIS — I700 Atherosclerosis of aorta: Secondary | ICD-10-CM | POA: Diagnosis not present

## 2021-08-03 DIAGNOSIS — C180 Malignant neoplasm of cecum: Secondary | ICD-10-CM | POA: Insufficient documentation

## 2021-08-03 DIAGNOSIS — Z85038 Personal history of other malignant neoplasm of large intestine: Secondary | ICD-10-CM | POA: Diagnosis not present

## 2021-08-03 MED ORDER — IOHEXOL 350 MG/ML SOLN
100.0000 mL | Freq: Once | INTRAVENOUS | Status: AC | PRN
  Administered 2021-08-03: 85 mL via INTRAVENOUS

## 2021-08-04 ENCOUNTER — Telehealth: Payer: Self-pay

## 2021-08-04 NOTE — Telephone Encounter (Signed)
Called and LM informing patient of CAT scan results, requested pt CB with any questions or concerns.

## 2021-08-17 ENCOUNTER — Other Ambulatory Visit (HOSPITAL_BASED_OUTPATIENT_CLINIC_OR_DEPARTMENT_OTHER): Payer: Self-pay

## 2021-08-17 DIAGNOSIS — Z23 Encounter for immunization: Secondary | ICD-10-CM | POA: Diagnosis not present

## 2021-08-17 DIAGNOSIS — F17210 Nicotine dependence, cigarettes, uncomplicated: Secondary | ICD-10-CM | POA: Diagnosis not present

## 2021-08-17 DIAGNOSIS — Z79899 Other long term (current) drug therapy: Secondary | ICD-10-CM | POA: Diagnosis not present

## 2021-08-17 DIAGNOSIS — M545 Low back pain, unspecified: Secondary | ICD-10-CM | POA: Diagnosis not present

## 2021-08-17 MED ORDER — OXYCODONE-ACETAMINOPHEN 10-325 MG PO TABS
ORAL_TABLET | ORAL | 0 refills | Status: DC
  Filled 2021-08-17: qty 120, 30d supply, fill #0

## 2021-08-18 ENCOUNTER — Other Ambulatory Visit: Payer: Self-pay | Admitting: Family

## 2021-08-18 ENCOUNTER — Other Ambulatory Visit (HOSPITAL_BASED_OUTPATIENT_CLINIC_OR_DEPARTMENT_OTHER): Payer: Self-pay

## 2021-08-18 ENCOUNTER — Other Ambulatory Visit: Payer: Self-pay | Admitting: Physical Medicine and Rehabilitation

## 2021-08-18 MED ORDER — HYDROXYZINE PAMOATE 25 MG PO CAPS
25.0000 mg | ORAL_CAPSULE | Freq: Three times a day (TID) | ORAL | 0 refills | Status: DC | PRN
  Filled 2021-08-18: qty 30, 10d supply, fill #0

## 2021-08-18 MED FILL — Fluticasone Furoate-Vilanterol Aero Powd BA 100-25 MCG/ACT: RESPIRATORY_TRACT | 30 days supply | Qty: 60 | Fill #3 | Status: AC

## 2021-08-19 ENCOUNTER — Other Ambulatory Visit (HOSPITAL_BASED_OUTPATIENT_CLINIC_OR_DEPARTMENT_OTHER): Payer: Self-pay

## 2021-08-24 ENCOUNTER — Other Ambulatory Visit: Payer: Self-pay | Admitting: Hematology & Oncology

## 2021-08-24 ENCOUNTER — Other Ambulatory Visit (HOSPITAL_BASED_OUTPATIENT_CLINIC_OR_DEPARTMENT_OTHER): Payer: Self-pay

## 2021-08-24 DIAGNOSIS — G629 Polyneuropathy, unspecified: Secondary | ICD-10-CM

## 2021-08-24 DIAGNOSIS — J430 Unilateral pulmonary emphysema [MacLeod's syndrome]: Secondary | ICD-10-CM

## 2021-08-24 DIAGNOSIS — C180 Malignant neoplasm of cecum: Secondary | ICD-10-CM

## 2021-08-24 MED ORDER — ALPRAZOLAM 1 MG PO TABS
ORAL_TABLET | ORAL | 2 refills | Status: DC
  Filled 2021-08-24: qty 30, 30d supply, fill #0
  Filled 2021-09-21: qty 30, 30d supply, fill #1
  Filled 2021-10-26: qty 30, 30d supply, fill #2

## 2021-08-24 NOTE — Telephone Encounter (Signed)
Please advise if ok to refill. Last refilled 05/29/21 # 30 with 0 refills.  Thank you!

## 2021-08-30 ENCOUNTER — Ambulatory Visit: Payer: Medicare Other

## 2021-08-30 DIAGNOSIS — Z20822 Contact with and (suspected) exposure to covid-19: Secondary | ICD-10-CM | POA: Diagnosis not present

## 2021-08-30 DIAGNOSIS — R519 Headache, unspecified: Secondary | ICD-10-CM | POA: Diagnosis not present

## 2021-08-30 DIAGNOSIS — R059 Cough, unspecified: Secondary | ICD-10-CM | POA: Diagnosis not present

## 2021-08-30 DIAGNOSIS — R5383 Other fatigue: Secondary | ICD-10-CM | POA: Diagnosis not present

## 2021-08-30 DIAGNOSIS — J449 Chronic obstructive pulmonary disease, unspecified: Secondary | ICD-10-CM | POA: Diagnosis not present

## 2021-08-30 DIAGNOSIS — F17210 Nicotine dependence, cigarettes, uncomplicated: Secondary | ICD-10-CM | POA: Diagnosis not present

## 2021-08-30 DIAGNOSIS — R0981 Nasal congestion: Secondary | ICD-10-CM | POA: Diagnosis not present

## 2021-08-30 DIAGNOSIS — R112 Nausea with vomiting, unspecified: Secondary | ICD-10-CM | POA: Diagnosis not present

## 2021-08-30 DIAGNOSIS — H6593 Unspecified nonsuppurative otitis media, bilateral: Secondary | ICD-10-CM | POA: Diagnosis not present

## 2021-08-30 DIAGNOSIS — R531 Weakness: Secondary | ICD-10-CM | POA: Diagnosis not present

## 2021-08-30 DIAGNOSIS — B348 Other viral infections of unspecified site: Secondary | ICD-10-CM | POA: Diagnosis not present

## 2021-08-30 DIAGNOSIS — L539 Erythematous condition, unspecified: Secondary | ICD-10-CM | POA: Diagnosis not present

## 2021-08-30 DIAGNOSIS — J029 Acute pharyngitis, unspecified: Secondary | ICD-10-CM | POA: Diagnosis not present

## 2021-08-30 DIAGNOSIS — R197 Diarrhea, unspecified: Secondary | ICD-10-CM | POA: Diagnosis not present

## 2021-09-01 ENCOUNTER — Other Ambulatory Visit (HOSPITAL_BASED_OUTPATIENT_CLINIC_OR_DEPARTMENT_OTHER): Payer: Self-pay

## 2021-09-01 ENCOUNTER — Emergency Department (HOSPITAL_BASED_OUTPATIENT_CLINIC_OR_DEPARTMENT_OTHER)
Admission: EM | Admit: 2021-09-01 | Discharge: 2021-09-01 | Disposition: A | Discharge status: Home / Self Care | Payer: Medicare Other | Attending: Emergency Medicine | Admitting: Emergency Medicine

## 2021-09-01 ENCOUNTER — Other Ambulatory Visit: Payer: Self-pay

## 2021-09-01 DIAGNOSIS — B348 Other viral infections of unspecified site: Secondary | ICD-10-CM | POA: Insufficient documentation

## 2021-09-01 DIAGNOSIS — Z85038 Personal history of other malignant neoplasm of large intestine: Secondary | ICD-10-CM | POA: Insufficient documentation

## 2021-09-01 DIAGNOSIS — F17210 Nicotine dependence, cigarettes, uncomplicated: Secondary | ICD-10-CM | POA: Diagnosis not present

## 2021-09-01 DIAGNOSIS — J449 Chronic obstructive pulmonary disease, unspecified: Secondary | ICD-10-CM | POA: Diagnosis not present

## 2021-09-01 DIAGNOSIS — R109 Unspecified abdominal pain: Secondary | ICD-10-CM | POA: Diagnosis not present

## 2021-09-01 LAB — CBC WITH DIFFERENTIAL/PLATELET
Abs Immature Granulocytes: 0.04 10*3/uL (ref 0.00–0.07)
Basophils Absolute: 0 10*3/uL (ref 0.0–0.1)
Basophils Relative: 0 %
Eosinophils Absolute: 0.1 10*3/uL (ref 0.0–0.5)
Eosinophils Relative: 1 %
HCT: 41.4 % (ref 36.0–46.0)
Hemoglobin: 13.6 g/dL (ref 12.0–15.0)
Immature Granulocytes: 0 %
Lymphocytes Relative: 14 %
Lymphs Abs: 1.3 10*3/uL (ref 0.7–4.0)
MCH: 23.5 pg — ABNORMAL LOW (ref 26.0–34.0)
MCHC: 32.9 g/dL (ref 30.0–36.0)
MCV: 71.6 fL — ABNORMAL LOW (ref 80.0–100.0)
Monocytes Absolute: 0.3 10*3/uL (ref 0.1–1.0)
Monocytes Relative: 4 %
Neutro Abs: 7.4 10*3/uL (ref 1.7–7.7)
Neutrophils Relative %: 81 %
Platelets: 177 10*3/uL (ref 150–400)
RBC: 5.78 MIL/uL — ABNORMAL HIGH (ref 3.87–5.11)
RDW: 15.2 % (ref 11.5–15.5)
WBC: 9.2 10*3/uL (ref 4.0–10.5)
nRBC: 0 % (ref 0.0–0.2)

## 2021-09-01 LAB — COMPREHENSIVE METABOLIC PANEL
ALT: 22 U/L (ref 0–44)
AST: 21 U/L (ref 15–41)
Albumin: 3.9 g/dL (ref 3.5–5.0)
Alkaline Phosphatase: 71 U/L (ref 38–126)
Anion gap: 9 (ref 5–15)
BUN: 10 mg/dL (ref 8–23)
CO2: 26 mmol/L (ref 22–32)
Calcium: 8.9 mg/dL (ref 8.9–10.3)
Chloride: 103 mmol/L (ref 98–111)
Creatinine, Ser: 0.74 mg/dL (ref 0.44–1.00)
GFR, Estimated: >60 mL/min (ref >60)
Glucose, Bld: 103 mg/dL — ABNORMAL HIGH (ref 70–99)
Potassium: 3.4 mmol/L — ABNORMAL LOW (ref 3.5–5.1)
Sodium: 138 mmol/L (ref 135–145)
Total Bilirubin: 0.2 mg/dL — ABNORMAL LOW (ref 0.3–1.2)
Total Protein: 7.3 g/dL (ref 6.5–8.1)

## 2021-09-01 LAB — URINALYSIS, ROUTINE W REFLEX MICROSCOPIC
Bilirubin Urine: NEGATIVE
Glucose, UA: NEGATIVE mg/dL
Hgb urine dipstick: NEGATIVE
Ketones, ur: NEGATIVE mg/dL
Leukocytes,Ua: NEGATIVE
Nitrite: NEGATIVE
Protein, ur: NEGATIVE mg/dL
Specific Gravity, Urine: 1.01 (ref 1.005–1.030)
pH: 7 (ref 5.0–8.0)

## 2021-09-01 LAB — LIPASE, BLOOD: Lipase: 28 U/L (ref 11–51)

## 2021-09-01 MED ORDER — SODIUM CHLORIDE 0.9 % IV BOLUS
1000.0000 mL | Freq: Once | INTRAVENOUS | Status: AC
  Administered 2021-09-01: 1000 mL via INTRAVENOUS

## 2021-09-01 NOTE — ED Triage Notes (Signed)
Pt went to [facility] emergency room, had tests done and dx with rhinovirus. Cough, diarrhea x 6 days. States pain to abdomen is worse

## 2021-09-01 NOTE — ED Notes (Signed)
Pt in restroom when called for triage. Will call again

## 2021-09-01 NOTE — ED Notes (Signed)
Pt is aware that a urine sample is requested.

## 2021-09-01 NOTE — Discharge Instructions (Signed)
Your symptoms today are consistent with your diagnosis of rhinovirus 2 days ago.  Given the viral nature of this illness, no antibiotics are indicated.  Lab work-up done today unremarkable for any new findings. Unfortunately symptoms associated with rhinovirus can take 7 to 10 days to resolve.  In the meantime ensure that you are maintaining adequate oral hydration.   Please keep your appointment with your primary care on Monday and be sure to mention this to them if you are still experiencing symptoms for additional intervention.  Return if you are unable to tolerate oral intake, or if symptoms worsen.

## 2021-09-01 NOTE — ED Provider Notes (Signed)
[facility] EMERGENCY DEPARTMENT Provider Note   CSN: [id number] Arrival date & time: 09/01/21  1010     History Chief Complaint  Patient presents with   Abdominal Pain    Teresa Morgan is a 65 y.o. female.  Patient with history of GERD presents today with complaint of abdominal pain.  Endorses 6 days of nonproductive cough, sinus inflammation, congestion, nausea, vomiting, diarrhea.  States that symptoms have been ongoing for 6 days, went to [facility] 2 days ago and was diagnosed with rhinovirus, given Zofran and Tessalon for same.  She has remained afebrile with continuing decreased oral intake last few days and has been feeling weak and fatigued.  She states that symptoms are same as they were when she was seen at [facility] 2 days ago, however are not improving.   The history is provided by the patient. No language interpreter was used.  Abdominal Pain Associated symptoms: cough, diarrhea, fatigue, nausea and vomiting   Associated symptoms: no chest pain, no chills, no constipation, no dysuria, no fever, no hematuria, no shortness of breath and no sore throat       Past Medical History:  Diagnosis Date   Anemia    Anxiety    Arthritis    Cataract    bilat removed   Colon cancer ([facility]) 2013   cecal cancer   COPD with emphysema ([facility]) 06/18/2012   Depression    GERD (gastroesophageal reflux disease)    Hyperlipidemia 05/27/2021   Neuromuscular disorder ([facility])    neuropathy from chemo   Peptic ulcer    Pneumonia    Shortness of breath dyspnea    with exertion    Patient Active Problem List   Diagnosis Date Noted   Hyperlipidemia 05/27/2021   Allergic reaction to insect bite 05/26/2021   Tobacco abuse 12/27/2017   Neuropathy 08/07/2015   Nipple problem 03/12/2015   Allergic rhinitis 11/17/2014   Therapeutic opioid induced constipation 09/14/2014   Diverticulosis of large intestine without hemorrhage 09/10/2014   Pain in female pelvis  09/02/2014   Lung nodule seen on imaging study 05/10/2013   Snoring 03/12/2013   Preventative health care 03/12/2013   GERD (gastroesophageal reflux disease) 02/11/2013   Anxiety and depression 02/11/2013   COPD with emphysema, Gold B 06/18/2012   Iron deficiency anemia due to chronic blood loss 11/25/2011   Gastritis 11/01/2011   Cecal cancer, pT3N2a 06/09/2011    Past Surgical History:  Procedure Laterality Date   APPENDECTOMY  07/07/11   BREAST EXCISIONAL BIOPSY Right 03/15/2018   COLON RESECTION  2012   for colon cancer   COLONOSCOPY     EYE SURGERY Bilateral    cataract surgery with lens implant   heel spurs Right    hemrrhoids     PLANTAR FASCIA SURGERY Right 2009   PORT-A-CATH REMOVAL Left 04/19/2016   Procedure: REMOVAL PORT-A-CATH;  Surgeon: [name], MD;  Location[facility];  Service: General;  Laterality: Left;   portacath     RADIOACTIVE SEED GUIDED EXCISIONAL BREAST BIOPSY Right 03/15/2018   Procedure: RADIOACTIVE SEED GUIDED EXCISIONAL BREAST BIOPSY;  Surgeon: [name], MD;  Location: [facility];  Service: General;  Laterality: Right;   TUBAL LIGATION  07/1980     OB History   No obstetric history on file.     Family History  Problem Relation Age of Onset   Diabetes Mother    Colon cancer Cousin    Colon cancer Cousin  Thalassemia Daughter    Colon polyps Neg Hx    Rectal cancer Neg Hx    Stomach cancer Neg Hx     Social History   Tobacco Use   Smoking status: Every Day    Packs/day: 1.00    Years: 45.00    Pack years: 45.00    Types: Cigarettes    Start date: 04/23/1986   Smokeless tobacco: Never  Substance Use Topics   Alcohol use: Yes    Alcohol/week: 0.0 standard drinks    Comment: social   Drug use: No    Home Medications Prior to Admission medications   Medication Sig Start Date End Date Taking? Authorizing Provider  albuterol (VENTOLIN HFA) 108 (90 Base) MCG/ACT inhaler INHALE 1 - 2 PUFFS INTO THE LUNGS EVERY 6  HOURS AS NEEDED FOR WHEEZING OR SHORTNESS OF BREATH 06/01/21 06/01/22 Yes Parrett, Fonnie Mu, NP  ALPRAZolam (XANAX) 1 MG tablet TAKE 1 TABLET BY MOUTH EVERY NIGHT AT BEDTIME AS NEEDED FOR ANXIETY OR SLEEP 08/24/21 02/20/22 Yes Ennever, Rudell Cobb, MD  atorvastatin (LIPITOR) 20 MG tablet Take 1 tablet (20 mg total) by mouth daily. 05/27/21  Yes Debbrah Alar, NP  benzonatate (TESSALON) 100 MG capsule Take by mouth 3 (three) times daily as needed for cough.   Yes [provider]  Calcium-Vitamin D 600-125 MG-UNIT TABS Take 1 tablet by mouth daily.   Yes [provider]  cyclobenzaprine (FLEXERIL) 10 MG tablet Take 1 tablet by mouth at bedtime 07/20/21  Yes   dimenhyDRINATE (DRAMAMINE) 50 MG tablet Take 50 mg by mouth every 8 (eight) hours as needed.   Yes [provider]  famotidine (PEPCID) 20 MG tablet Take 40 mg by mouth 2 (two) times daily. 07/10/20  Yes [provider]  fluticasone furoate-vilanterol (BREO ELLIPTA) 100-25 MCG/INH AEPB INHALE 1 PUFF INTO THE LUNGS DAILY 01/07/21 01/07/22 Yes Parrett, Tammy S, NP  hydrOXYzine (VISTARIL) 25 MG capsule Take 1 capsule (25 mg total) by mouth every 8 (eight) hours as needed. 08/18/21  Yes Debbrah Alar, NP  lidocaine (LIDODERM) 5 % PLACE 1 PATCH ONTO THE SKIN DAILY. REMOVE & DISCARD PATCH WITHIN 12 HOURS OR AS DIRECTED BY MD 03/30/21 03/30/22 Yes Raulkar, Clide Deutscher, MD  Multiple Vitamin (MULTIVITAMIN) tablet Take 1 tablet by mouth daily.   Yes [provider]  oxyCODONE-acetaminophen (PERCOCET) 10-325 MG tablet Take 1 (one) Tablet by mouth four times daily as needed 08/17/21  Yes   Pyridoxine HCl (VITAMIN B-6) 250 MG tablet Take 1 tablet (250 mg total) by mouth daily. 05/22/14  Yes Volanda Napoleon, MD  TURMERIC PO Take 1 tablet by mouth daily.   Yes [provider]  vitamin E 200 UNIT capsule Take 200 Units by mouth daily.   Yes [provider]  vortioxetine HBr (TRINTELLIX) 5 MG TABS tablet TAKE 1  TABLET BY MOUTH ONCE DAILY 05/26/21 05/26/22 Yes Debbrah Alar, NP  EPINEPHrine (EPIPEN 2-PAK) 0.3 mg/0.3 mL IJ SOAJ injection Inject 0.3 mg into the muscle as needed for anaphylaxis. 05/26/21   Debbrah Alar, NP    Allergies    Aspirin, Cymbalta [duloxetine hcl], Prozac [fluoxetine hcl], and Wellbutrin [bupropion]  Review of Systems   Review of Systems  Constitutional:  Positive for fatigue. Negative for chills and fever.  HENT:  Positive for congestion, rhinorrhea and sneezing. Negative for drooling, facial swelling, mouth sores, nosebleeds, sore throat, trouble swallowing and voice change.   Eyes:  Negative for pain and redness.  Respiratory:  Positive  for cough. Negative for shortness of breath, wheezing and stridor.   Cardiovascular:  Negative for chest pain.  Gastrointestinal:  Positive for abdominal pain, diarrhea, nausea and vomiting. Negative for abdominal distention, blood in stool and constipation.  Genitourinary:  Negative for dysuria and hematuria.  Musculoskeletal:  Negative for back pain, neck pain and neck stiffness.  Skin:  Negative for rash and wound.  Neurological:  Negative for dizziness, tremors, seizures, syncope, facial asymmetry, speech difficulty, weakness, light-headedness, numbness and headaches.  Psychiatric/Behavioral:  Negative for confusion and decreased concentration.   All other systems reviewed and are negative.  Physical Exam Updated Vital Signs BP 128/76 (BP Location: Right Arm)   Pulse 80   Temp 98.2 F (36.8 C) (Oral)   Resp (!) 22   Ht 5' 8.5" (1.74 m)   Wt 67.1 kg   SpO2 95%   BMI 22.18 kg/m   Physical Exam Vitals and nursing note reviewed.  Constitutional:      General: She is not in acute distress.    Appearance: Normal appearance. She is well-developed and normal weight. She is not ill-appearing, toxic-appearing or diaphoretic.  HENT:     Head: Normocephalic and atraumatic.     Mouth/Throat:     Mouth: Mucous membranes are  moist.     Pharynx: No pharyngeal swelling or oropharyngeal exudate.  Eyes:     General: No scleral icterus.    Extraocular Movements: Extraocular movements intact.     Pupils: Pupils are equal, round, and reactive to light.  Cardiovascular:     Rate and Rhythm: Normal rate and regular rhythm.  Pulmonary:     Effort: Pulmonary effort is normal. No respiratory distress.     Breath sounds: Normal breath sounds. No stridor. No wheezing, rhonchi or rales.  Chest:     Chest wall: No tenderness.  Abdominal:     General: Abdomen is flat. Bowel sounds are normal. There is no distension.     Palpations: Abdomen is soft.     Tenderness: There is generalized abdominal tenderness. There is no right CVA tenderness, left CVA tenderness, guarding or rebound. Negative signs include Murphy's sign, Rovsing's sign, McBurney's sign, psoas sign and obturator sign.  Musculoskeletal:        General: Normal range of motion.     Cervical back: Normal range of motion.  Skin:    General: Skin is warm and dry.  Neurological:     General: No focal deficit present.     Mental Status: She is alert.  Psychiatric:        Mood and Affect: Mood normal.        Behavior: Behavior normal.    ED Results / Procedures / Treatments   Labs (all labs ordered are listed, but only abnormal results are displayed) Labs Reviewed  COMPREHENSIVE METABOLIC PANEL - Abnormal; Notable for the following components:      Result Value   Potassium 3.4 (*)    Glucose, Bld 103 (*)    Total Bilirubin 0.2 (*)    All other components within normal limits  CBC WITH DIFFERENTIAL/PLATELET - Abnormal; Notable for the following components:   RBC 5.78 (*)    MCV 71.6 (*)    MCH 23.5 (*)    All other components within normal limits  LIPASE, BLOOD  URINALYSIS, ROUTINE W REFLEX MICROSCOPIC    EKG None  Radiology No results found.  Procedures Procedures   Medications Ordered in ED Medications  sodium chloride 0.9 % bolus 1,000  mL  (0 mLs Intravenous Stopped 09/01/21 1251)    ED Course  I have reviewed the triage vital signs and the nursing notes.  Pertinent labs & imaging results that were available during my care of the patient were reviewed by me and considered in my medical decision making (see chart for details).    MDM Rules/Calculators/A&P                         Patient presents with abdominal pain and cold symptoms x 6 days. Was seen at [facility] 2 days ago, diagnosed with rhinovirus and given Zofran and Tessalon. Patient states that symptoms are unchanged from 2 days ago which prompted visit. Patient is afebrile, non-toxic appearing, and in no acute distress.  Lungs are clear to auscultation in all fields, no concern for pneumonia.  Some generalized abdominal pain, however negative peritoneal signs, low suspicion for acute abdomen at this time. Patient's pain and other symptoms adequately managed in emergency department.  Fluid bolus given.  Labs and vitals reviewed, no leukocytosis, anemia, or electrolyte abnormalities.  Patient does not meet the SIRS or Sepsis criteria. No indication of appendicitis, bowel obstruction, bowel perforation, cholecystitis, or diverticulitis.  Feel that patient's symptoms continue to be consistent with diagnosis of rhinovirus.  Patient discharged home with symptomatic treatment and given strict instructions for follow-up with their primary care physician.  I have also discussed reasons to return immediately to the ER.  Patient expresses understanding and agrees with plan.   This is a shared visit with supervising physician Dr. Tyrone Nine who has independently evaluated patient & provided guidance in evaluation/management/disposition, in agreement with care    Final Clinical Impression(s) / ED Diagnoses Final diagnoses:  Rhinovirus    Rx / DC Orders ED Discharge Orders     None     An After Visit Summary was printed and given to the patient.    Nestor Lewandowsky 09/01/21 2130    Deno Etienne, DO 09/04/21 2132

## 2021-09-02 NOTE — Progress Notes (Signed)
Subjective:   Teresa Morgan is a 65 y.o. female who presents for an Initial Medicare Annual Wellness Visit.  I connected with Cena today by telephone and verified that I am speaking with the correct person using two identifiers. Location patient: home Location provider: work Persons participating in the virtual visit: patient, Marine scientist.    I discussed the limitations, risks, security and privacy concerns of performing an evaluation and management service by telephone and the availability of in person appointments. I also discussed with the patient that there may be a patient responsible charge related to this service. The patient expressed understanding and verbally consented to this telephonic visit.    Interactive audio and video telecommunications were attempted between this provider and patient, however failed, due to patient having technical difficulties OR patient did not have access to video capability.  We continued and completed visit with audio only.  Some vital signs may be absent or patient reported.   Time Spent with patient on telephone encounter: 25 minutes   Review of Systems     Cardiac Risk Factors include: advanced age (>85men, >66 women);dyslipidemia;smoking/ tobacco exposure     Objective:    Today's Vitals   09/06/21 1022  Weight: 148 lb (67.1 kg)  Height: 5\' 8"  (1.727 m)   Body mass index is 22.5 kg/m.  Advanced Directives 09/06/2021 09/01/2021 07/23/2021 01/23/2019 07/25/2018 04/23/2018 03/15/2018  Does Patient Have a Medical Advance Directive? No No No No No No No  Would patient like information on creating a medical advance directive? Yes (MAU/Ambulatory/Procedural Areas - Information given) No - Patient declined No - Patient declined No - Patient declined - - No - Patient declined    Current Medications (verified) Outpatient Encounter Medications as of 09/06/2021  Medication Sig   albuterol (VENTOLIN HFA) 108 (90 Base) MCG/ACT inhaler INHALE 1 - 2 PUFFS  INTO THE LUNGS EVERY 6 HOURS AS NEEDED FOR WHEEZING OR SHORTNESS OF BREATH   ALPRAZolam (XANAX) 1 MG tablet TAKE 1 TABLET BY MOUTH EVERY NIGHT AT BEDTIME AS NEEDED FOR ANXIETY OR SLEEP   atorvastatin (LIPITOR) 20 MG tablet Take 1 tablet (20 mg total) by mouth daily.   benzonatate (TESSALON) 100 MG capsule Take by mouth 3 (three) times daily as needed for cough.   Calcium-Vitamin D 600-125 MG-UNIT TABS Take 1 tablet by mouth daily.   cyclobenzaprine (FLEXERIL) 10 MG tablet Take 1 tablet by mouth at bedtime   dimenhyDRINATE (DRAMAMINE) 50 MG tablet Take 50 mg by mouth every 8 (eight) hours as needed.   EPINEPHrine (EPIPEN 2-PAK) 0.3 mg/0.3 mL IJ SOAJ injection Inject 0.3 mg into the muscle as needed for anaphylaxis.   famotidine (PEPCID) 20 MG tablet Take 40 mg by mouth 2 (two) times daily.   fluticasone furoate-vilanterol (BREO ELLIPTA) 100-25 MCG/INH AEPB INHALE 1 PUFF INTO THE LUNGS DAILY   hydrOXYzine (VISTARIL) 25 MG capsule Take 1 capsule (25 mg total) by mouth every 8 (eight) hours as needed.   lidocaine (LIDODERM) 5 % PLACE 1 PATCH ONTO THE SKIN DAILY. REMOVE & DISCARD PATCH WITHIN 12 HOURS OR AS DIRECTED BY MD   Multiple Vitamin (MULTIVITAMIN) tablet Take 1 tablet by mouth daily.   oxyCODONE-acetaminophen (PERCOCET) 10-325 MG tablet Take 1 (one) Tablet by mouth four times daily as needed   Pyridoxine HCl (VITAMIN B-6) 250 MG tablet Take 1 tablet (250 mg total) by mouth daily.   TURMERIC PO Take 1 tablet by mouth daily.   vitamin E 200 UNIT capsule Take  200 Units by mouth daily.   vortioxetine HBr (TRINTELLIX) 5 MG TABS tablet TAKE 1 TABLET BY MOUTH ONCE DAILY   No facility-administered encounter medications on file as of 09/06/2021.    Allergies (verified) Aspirin, Cymbalta [duloxetine hcl], Prozac [fluoxetine hcl], and Wellbutrin [bupropion]   History: Past Medical History:  Diagnosis Date   Anemia    Anxiety    Arthritis    Cataract    bilat removed   Colon cancer ([facility]) 2013    cecal cancer   COPD with emphysema ([place]) 06/18/2012   Depression    GERD (gastroesophageal reflux disease)    Hyperlipidemia 05/27/2021   Neuromuscular disorder (HCC)    neuropathy from chemo   Peptic ulcer    Pneumonia    Shortness of breath dyspnea    with exertion   Past Surgical History:  Procedure Laterality Date   APPENDECTOMY  07/07/11   BREAST EXCISIONAL BIOPSY Right 03/15/2018   COLON RESECTION  2012   for colon cancer   COLONOSCOPY     EYE SURGERY Bilateral    cataract surgery with lens implant   heel spurs Right    hemrrhoids     PLANTAR FASCIA SURGERY Right 2009   PORT-A-CATH REMOVAL Left 04/19/2016   Procedure: REMOVAL PORT-A-CATH;  Surgeon: [name], MD;  Location: [facility];  Service: General;  Laterality: Left;   portacath     RADIOACTIVE SEED GUIDED EXCISIONAL BREAST BIOPSY Right 03/15/2018   Procedure: RADIOACTIVE SEED GUIDED EXCISIONAL BREAST BIOPSY;  Surgeon: [name], MD;  Location: [facility];  Service: General;  Laterality: Right;   TUBAL LIGATION  07/1980   Family History  Problem Relation Age of Onset   Diabetes Mother    Colon cancer Cousin    Colon cancer Cousin    Thalassemia Daughter    Colon polyps Neg Hx    Rectal cancer Neg Hx    Stomach cancer Neg Hx    Social History   Socioeconomic History   Marital status: Married    Spouse name: Not on file   Number of children: 3   Years of education: Not on file   Highest education level: Not on file  Occupational History   Occupation: Armed forces operational officer: [employer]  Tobacco Use   Smoking status: Every Day    Packs/day: 1.00    Years: 45.00    Pack years: 45.00    Types: Cigarettes    Start date: 04/23/1986   Smokeless tobacco: Never  Substance and Sexual Activity   Alcohol use: Yes    Alcohol/week: 0.0 standard drinks    Comment: social   Drug use: No   Sexual activity: Not Currently    Birth control/protection: Surgical  Other Topics Concern   Not on file   Social History Narrative   3 children- all daughter- live locally   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, minerals   Married      Investment banker, operational of Radio broadcast assistant Strain: Low Risk    Difficulty of Paying Living Expenses: Not hard at [employer] Insecurity: No Food Insecurity   Worried About Charity fundraiser in the Last Year: Never true   Arboriculturist in the Last Year: Never true  Transportation Needs: No Transportation Needs   Lack of Transportation (Medical): No   Lack of Transportation (Non-Medical): No  Physical Activity: Inactive   Days of Exercise per Week: 0 days  Minutes of Exercise per Session: 0 min  Stress: No Stress Concern Present   Feeling of Stress : Not at all  Social Connections: Socially Isolated   Frequency of Communication with Friends and Family: Once a week   Frequency of Social Gatherings with Friends and Family: Once a week   Attends Religious Services: Never   Marine scientist or Organizations: No   Attends Music therapist: Never   Marital Status: Married    Tobacco Counseling Ready to quit: Not Answered Counseling given: Not Answered   Clinical Intake:  Pre-visit preparation completed: Yes  Pain : No/denies pain     BMI - recorded: 22.5 Nutritional Status: BMI of 19-24  Normal Nutritional Risks: None Diabetes: No  How often do you need to have someone help you when you read instructions, pamphlets, or other written materials from your doctor or pharmacy?: 1 - Never  Diabetic?No   Interpreter Needed?: No  Information entered by :: Caroleen Hamman LPN   Activities of Daily Living In your present state of health, do you have any difficulty performing the following activities: 09/06/2021 05/26/2021  Hearing? N N  Vision? N N  Difficulty concentrating or making decisions? N N  Walking or climbing stairs? N N  Dressing or bathing? N N  Doing errands, shopping? N N  Preparing Food and eating ? N -   Using the Toilet? N -  In the past six months, have you accidently leaked urine? N -  Do you have problems with loss of bowel control? N -  Managing your Medications? N -  Managing your Finances? N -  Housekeeping or managing your Housekeeping? N -  Some recent data might be hidden    Patient Care Team: Debbrah Alar, NP as PCP - General (Internal Medicine) Elsie Stain, MD as Attending Physician (Pulmonary Disease) Volanda Napoleon, MD as Consulting Physician (Oncology)  Indicate any recent Medical Services you may have received from other than [facility] providers in the past year (date may be approximate).     Assessment:   This is a routine wellness examination for [facility].  Hearing/Vision screen Hearing Screening - Comments:: No issues Vision Screening - Comments:: Reading glasses Last eye exam-several years ago  Dietary issues and exercise activities discussed: Current Exercise Habits: The patient does not participate in regular exercise at present, Exercise limited by: None identified   Goals Addressed             This Visit's Progress    Quit Smoking         Depression Screen PHQ 2/9 Scores 09/06/2021 11/24/2020 09/25/2020 05/20/2020 10/22/2018 01/22/2018 07/24/2017  PHQ - 2 Score 0 3 3 0 0 2 0  PHQ- 9 Score - 6 6 3 3 6 1     Fall Risk Fall Risk  09/06/2021 09/25/2020 05/20/2020 01/18/2017 07/20/2016  Falls in the past year? 0 1 0 No No  Number falls in past yr: 0 0 0 - -  Injury with Fall? 0 0 0 - -  Follow up Falls prevention discussed - Falls evaluation completed - -    FALL RISK PREVENTION PERTAINING TO THE HOME:  Any stairs in or around the home? Yes  If so, are there any without handrails? No  Home free of loose throw rugs in walkways, pet beds, electrical cords, etc? Yes  Adequate lighting in your home to reduce risk of falls? Yes   ASSISTIVE DEVICES UTILIZED TO PREVENT FALLS:  Life alert?  No  Use of a cane, walker or w/c? No  Grab bars in the  bathroom? Yes  Shower chair or bench in shower? No  Elevated toilet seat or a handicapped toilet? No   TIMED UP AND GO:  Was the test performed? No . Phone visit   Cognitive Function:Normal cognitive status assessed by direct observation by this Nurse Health Advisor. No abnormalities found.          Immunizations Immunization History  Administered Date(s) Administered   Influenza Split 11/14/2012, 08/16/2021   Influenza, High Dose Seasonal PF 08/24/2020   Influenza, Seasonal, Injecte, Preservative Fre 11/12/2014   Influenza,inj,Quad PF,6+ Mos 07/24/2017, 10/22/2018   Influenza-Unspecified 09/13/2013, 07/31/2015, 08/04/2016   Moderna SARS-COV2 Booster Vaccination 03/30/2021   Moderna Sars-Covid-2 Vaccination 12/28/2019, 01/18/2020, 10/05/2020   Pneumococcal Polysaccharide-23 03/12/2013, 05/20/2020   Tdap 03/12/2013   Zoster Recombinat (Shingrix) 10/22/2018, 05/07/2019    TDAP status: Up to date  Flu Vaccine status: Up to date  Pneumococcal vaccine status: Due, Education has been provided regarding the importance of this vaccine. Advised may receive this vaccine at local pharmacy or Health Dept. Aware to provide a copy of the vaccination record if obtained from local pharmacy or Health Dept. Verbalized acceptance and understanding.  Covid-19 vaccine status: Information provided on how to obtain vaccines.   Qualifies for Shingles Vaccine? No   Zostavax completed No   Shingrix Completed?: Yes  Screening Tests Health Maintenance  Topic Date Due   Hepatitis C Screening  Never done   Pneumonia Vaccine 45+ Years old (3 - PCV) 05/20/2021   COVID-19 Vaccine (4 - Booster) 05/25/2021   TETANUS/TDAP  03/13/2023   COLONOSCOPY (Pts 45-50yrs Insurance coverage will need to be confirmed)  04/24/2023   MAMMOGRAM  07/14/2023   INFLUENZA VACCINE  Completed   DEXA SCAN  Completed   Zoster Vaccines- Shingrix  Completed   HPV VACCINES  Aged Out    Health Maintenance  Health  Maintenance Due  Topic Date Due   Hepatitis C Screening  Never done   Pneumonia Vaccine 55+ Years old (3 - PCV) 05/20/2021   COVID-19 Vaccine (4 - Booster) 05/25/2021    Colorectal cancer screening: Type of screening: Colonoscopy. Completed 04/23/2018. Repeat every 5 years  Mammogram status: Completed bilateral 07/13/2021. Repeat every year  Bone Density status: Completed 05/25/2020. Results reflect: Bone density results: NORMAL. Repeat every 2 years.  Lung Cancer Screening: Completed 09/14/2020  Additional Screening:  Hepatitis C Screening: does qualify; Patient to discuss with PCP  Vision Screening: Recommended annual ophthalmology exams for early detection of glaucoma and other disorders of the eye. Is the patient up to date with their annual eye exam?  No  Who is the provider or what is the name of the office in which the patient attends annual eye exams? [facility] Patient plans to make an appt soon  Dental Screening: Recommended annual dental exams for proper oral hygiene  Community Resource Referral / Chronic Care Management: CRR required this visit?  No   CCM required this visit?  No      Plan:     I have personally reviewed and noted the following in the patient's chart:   Medical and social history Use of alcohol, tobacco or illicit drugs  Current medications and supplements including opioid prescriptions. Patient is currently taking opioid prescriptions. Information provided to patient regarding non-opioid alternatives. Patient advised to discuss non-opioid treatment plan with their provider. Functional ability and status Nutritional status Physical activity Advanced directives List  of other physicians Hospitalizations, surgeries, and ER visits in previous 12 months Vitals Screenings to include cognitive, depression, and falls Referrals and appointments  In addition, I have reviewed and discussed with patient certain preventive protocols, quality metrics,  and best practice recommendations. A written personalized care plan for preventive services as well as general preventive health recommendations were provided to patient.   Due to this being a telephonic visit, the after visit summary with patients personalized plan was offered to patient via mail or my-chart. Patient would like to access on my-chart.  Marta Antu, LPN   [id number]  Nurse Health Advisor  Nurse Notes: None

## 2021-09-03 ENCOUNTER — Other Ambulatory Visit (HOSPITAL_COMMUNITY): Payer: Self-pay

## 2021-09-06 ENCOUNTER — Ambulatory Visit (INDEPENDENT_AMBULATORY_CARE_PROVIDER_SITE_OTHER): Payer: Medicare Other

## 2021-09-06 VITALS — Ht 68.0 in | Wt 148.0 lb

## 2021-09-06 DIAGNOSIS — Z0000 Encounter for general adult medical examination without abnormal findings: Secondary | ICD-10-CM

## 2021-09-06 NOTE — Patient Instructions (Signed)
[name] , Thank you for taking time to complete your Medicare Wellness Visit. I appreciate your ongoing commitment to your health goals. Please review the following plan we discussed and let me know if I can assist you in the future.   Screening recommendations/referrals: Colonoscopy: Completed 04/23/2018-Due 04/24/2023 Mammogram: Completed 07/13/2021-Due 07/13/2022 Bone Density: Completed 05/25/2020-Due 05/25/2022 Recommended yearly ophthalmology/optometry visit for glaucoma screening and checkup Recommended yearly dental visit for hygiene and checkup  Vaccinations: Influenza vaccine: Up to date Pneumococcal vaccine: Due-May obtain vaccine at our office or your local pharmacy. Tdap vaccine: Up to date-Due-03/13/2023 Shingles vaccine: Completed vaccines   Covid-19:Booster available at the pharmacy.  Advanced directives: Information mailed today  Conditions/risks identified: See problem list  Next appointment: Follow up in one year for your annual wellness visit 09/08/2022 @ 10:20.   Preventive Care 66 Years and Older, Female Preventive care refers to lifestyle choices and visits with your health care provider that can promote health and wellness. What does preventive care include? A yearly physical exam. This is also called an annual well check. Dental exams once or twice a year. Routine eye exams. Ask your health care provider how often you should have your eyes checked. Personal lifestyle choices, including: Daily care of your teeth and gums. Regular physical activity. Eating a healthy diet. Avoiding tobacco and drug use. Limiting alcohol use. Practicing safe sex. Taking low-dose aspirin every day. Taking vitamin and mineral supplements as recommended by your health care provider. What happens during an annual well check? The services and screenings done by your health care provider during your annual well check will depend on your age, overall health, lifestyle risk factors,  and family history of disease. Counseling  Your health care provider may ask you questions about your: Alcohol use. Tobacco use. Drug use. Emotional well-being. Home and relationship well-being. Sexual activity. Eating habits. History of falls. Memory and ability to understand (cognition). Work and work Statistician. Reproductive health. Screening  You may have the following tests or measurements: Height, weight, and BMI. Blood pressure. Lipid and cholesterol levels. These may be checked every 5 years, or more frequently if you are over 20 years old. Skin check. Lung cancer screening. You may have this screening every year starting at age 21 if you have a 30-pack-year history of smoking and currently smoke or have quit within the past 15 years. Fecal occult blood test (FOBT) of the stool. You may have this test every year starting at age 43. Flexible sigmoidoscopy or colonoscopy. You may have a sigmoidoscopy every 5 years or a colonoscopy every 10 years starting at age 65. Hepatitis C blood test. Hepatitis B blood test. Sexually transmitted disease (STD) testing. Diabetes screening. This is done by checking your blood sugar (glucose) after you have not eaten for a while (fasting). You may have this done every 1-3 years. Bone density scan. This is done to screen for osteoporosis. You may have this done starting at age 3. Mammogram. This may be done every 1-2 years. Talk to your health care provider about how often you should have regular mammograms. Talk with your health care provider about your test results, treatment options, and if necessary, the need for more tests. Vaccines  Your health care provider may recommend certain vaccines, such as: Influenza vaccine. This is recommended every year. Tetanus, diphtheria, and acellular pertussis (Tdap, Td) vaccine. You may need a Td booster every 10 years. Zoster vaccine. You may need this after age 45. Pneumococcal 13-valent conjugate  (PCV13) vaccine.  One dose is recommended after age 44. Pneumococcal polysaccharide (PPSV23) vaccine. One dose is recommended after age 106. Talk to your health care provider about which screenings and vaccines you need and how often you need them. This information is not intended to replace advice given to you by your health care provider. Make sure you discuss any questions you have with your health care provider. Document Released: 11/27/2015 Document Revised: 07/20/2016 Document Reviewed: 09/01/2015 Elsevier Interactive Patient Education  2017 [place] Prevention in the Home Falls can cause injuries. They can happen to people of all ages. There are many things you can do to make your home safe and to help prevent falls. What can I do on the outside of my home? Regularly fix the edges of walkways and driveways and fix any cracks. Remove anything that might make you trip as you walk through a door, such as a raised step or threshold. Trim any bushes or trees on the path to your home. Use bright outdoor lighting. Clear any walking paths of anything that might make someone trip, such as rocks or tools. Regularly check to see if handrails are loose or broken. Make sure that both sides of any steps have handrails. Any raised decks and porches should have guardrails on the edges. Have any leaves, snow, or ice cleared regularly. Use sand or salt on walking paths during winter. Clean up any spills in your garage right away. This includes oil or grease spills. What can I do in the bathroom? Use night lights. Install grab bars by the toilet and in the tub and shower. Do not use towel bars as grab bars. Use non-skid mats or decals in the tub or shower. If you need to sit down in the shower, use a plastic, non-slip stool. Keep the floor dry. Clean up any water that spills on the floor as soon as it happens. Remove soap buildup in the tub or shower regularly. Attach bath mats securely with  double-sided non-slip rug tape. Do not have throw rugs and other things on the floor that can make you trip. What can I do in the bedroom? Use night lights. Make sure that you have a light by your bed that is easy to reach. Do not use any sheets or blankets that are too big for your bed. They should not hang down onto the floor. Have a firm chair that has side arms. You can use this for support while you get dressed. Do not have throw rugs and other things on the floor that can make you trip. What can I do in the kitchen? Clean up any spills right away. Avoid walking on wet floors. Keep items that you use a lot in easy-to-reach places. If you need to reach something above you, use a strong step stool that has a grab bar. Keep electrical cords out of the way. Do not use floor polish or wax that makes floors slippery. If you must use wax, use non-skid floor wax. Do not have throw rugs and other things on the floor that can make you trip. What can I do with my stairs? Do not leave any items on the stairs. Make sure that there are handrails on both sides of the stairs and use them. Fix handrails that are broken or loose. Make sure that handrails are as long as the stairways. Check any carpeting to make sure that it is firmly attached to the stairs. Fix any carpet that is loose or worn.  Avoid having throw rugs at the top or bottom of the stairs. If you do have throw rugs, attach them to the floor with carpet tape. Make sure that you have a light switch at the top of the stairs and the bottom of the stairs. If you do not have them, ask someone to add them for you. What else can I do to help prevent falls? Wear shoes that: Do not have high heels. Have rubber bottoms. Are comfortable and fit you well. Are closed at the toe. Do not wear sandals. If you use a stepladder: Make sure that it is fully opened. Do not climb a closed stepladder. Make sure that both sides of the stepladder are locked  into place. Ask someone to hold it for you, if possible. Clearly mark and make sure that you can see: Any grab bars or handrails. First and last steps. Where the edge of each step is. Use tools that help you move around (mobility aids) if they are needed. These include: Canes. Walkers. Scooters. Crutches. Turn on the lights when you go into a dark area. Replace any light bulbs as soon as they burn out. Set up your furniture so you have a clear path. Avoid moving your furniture around. If any of your floors are uneven, fix them. If there are any pets around you, be aware of where they are. Review your medicines with your doctor. Some medicines can make you feel dizzy. This can increase your chance of falling. Ask your doctor what other things that you can do to help prevent falls. This information is not intended to replace advice given to you by your health care provider. Make sure you discuss any questions you have with your health care provider. Document Released: 08/27/2009 Document Revised: 04/07/2016 Document Reviewed: 12/05/2014 Elsevier Interactive Patient Education  2017 [employer].

## 2021-09-18 DIAGNOSIS — Z79899 Other long term (current) drug therapy: Secondary | ICD-10-CM | POA: Diagnosis not present

## 2021-09-18 DIAGNOSIS — M545 Low back pain, unspecified: Secondary | ICD-10-CM | POA: Diagnosis not present

## 2021-09-18 DIAGNOSIS — F17210 Nicotine dependence, cigarettes, uncomplicated: Secondary | ICD-10-CM | POA: Diagnosis not present

## 2021-09-18 DIAGNOSIS — Z0000 Encounter for general adult medical examination without abnormal findings: Secondary | ICD-10-CM | POA: Diagnosis not present

## 2021-09-20 ENCOUNTER — Other Ambulatory Visit: Payer: Self-pay | Admitting: Adult Health

## 2021-09-20 ENCOUNTER — Other Ambulatory Visit (HOSPITAL_BASED_OUTPATIENT_CLINIC_OR_DEPARTMENT_OTHER): Payer: Self-pay

## 2021-09-21 ENCOUNTER — Telehealth: Payer: Self-pay

## 2021-09-21 ENCOUNTER — Telehealth: Payer: Self-pay | Admitting: Pulmonary Disease

## 2021-09-21 ENCOUNTER — Other Ambulatory Visit: Payer: Self-pay | Admitting: Family

## 2021-09-21 ENCOUNTER — Other Ambulatory Visit (HOSPITAL_BASED_OUTPATIENT_CLINIC_OR_DEPARTMENT_OTHER): Payer: Self-pay

## 2021-09-21 MED ORDER — FLUTICASONE FUROATE-VILANTEROL 100-25 MCG/ACT IN AEPB
INHALATION_SPRAY | RESPIRATORY_TRACT | 2 refills | Status: DC
  Filled 2021-09-21: qty 60, 30d supply, fill #0

## 2021-09-21 NOTE — Telephone Encounter (Signed)
Patient called stating she is going on a cruise and is leaving Saturday and is requesting to pick up her xanax from pharmacy Friday but needs [place] to call as it is a few days early. [place] and gave verbal that it was okay for pt to pick up refill early per NP.

## 2021-09-21 NOTE — Telephone Encounter (Signed)
Routing to pharmacy prior auth team for them to review this for pt.

## 2021-09-22 ENCOUNTER — Other Ambulatory Visit (HOSPITAL_COMMUNITY): Payer: Self-pay

## 2021-09-22 ENCOUNTER — Other Ambulatory Visit (HOSPITAL_BASED_OUTPATIENT_CLINIC_OR_DEPARTMENT_OTHER): Payer: Self-pay

## 2021-09-22 MED ORDER — HYDROXYZINE PAMOATE 25 MG PO CAPS
25.0000 mg | ORAL_CAPSULE | Freq: Three times a day (TID) | ORAL | 0 refills | Status: DC | PRN
  Filled 2021-09-22: qty 30, 10d supply, fill #0

## 2021-09-22 NOTE — Telephone Encounter (Signed)
Attempted to call pt but unable to reach. Left message for her to return call. 

## 2021-09-22 NOTE — Telephone Encounter (Signed)
There is a generic available for Memory Dance, however [facility] prefers mail order for any maintenance or revolving medications. [facility] ended contract with multiple pharmacies, including [facility] effective 11.1.22.

## 2021-09-24 ENCOUNTER — Other Ambulatory Visit (HOSPITAL_BASED_OUTPATIENT_CLINIC_OR_DEPARTMENT_OTHER): Payer: Self-pay

## 2021-09-30 NOTE — Telephone Encounter (Signed)
Lmtcb for pt.  

## 2021-10-05 MED ORDER — FLUTICASONE FUROATE-VILANTEROL 100-25 MCG/ACT IN AEPB: INHALATION_SPRAY | RESPIRATORY_TRACT | 2 refills | Status: DC

## 2021-10-05 NOTE — Telephone Encounter (Signed)
Called and spoke to pt. Informed her of the info per pharmacy team. Breo sent to [facility]. Pt verbalized understanding and denied any further questions or concerns at this time.

## 2021-10-16 ENCOUNTER — Other Ambulatory Visit (HOSPITAL_COMMUNITY): Payer: Self-pay

## 2021-10-16 DIAGNOSIS — M545 Low back pain, unspecified: Secondary | ICD-10-CM | POA: Diagnosis not present

## 2021-10-16 DIAGNOSIS — Z79899 Other long term (current) drug therapy: Secondary | ICD-10-CM | POA: Diagnosis not present

## 2021-10-16 DIAGNOSIS — F17210 Nicotine dependence, cigarettes, uncomplicated: Secondary | ICD-10-CM | POA: Diagnosis not present

## 2021-10-16 MED ORDER — OXYCODONE-ACETAMINOPHEN 10-325 MG PO TABS
ORAL_TABLET | ORAL | 0 refills | Status: DC
  Filled 2021-10-16: qty 120, 30d supply, fill #0

## 2021-10-22 ENCOUNTER — Other Ambulatory Visit: Payer: Self-pay

## 2021-10-22 ENCOUNTER — Other Ambulatory Visit (HOSPITAL_BASED_OUTPATIENT_CLINIC_OR_DEPARTMENT_OTHER): Payer: Self-pay

## 2021-10-22 MED ORDER — VORTIOXETINE HBR 5 MG PO TABS
ORAL_TABLET | Freq: Every day | ORAL | 0 refills | Status: DC
  Filled 2021-10-22 – 2021-11-29 (×3): qty 90, 90d supply, fill #0

## 2021-10-26 ENCOUNTER — Other Ambulatory Visit: Payer: Self-pay | Admitting: Family

## 2021-10-26 ENCOUNTER — Other Ambulatory Visit (HOSPITAL_BASED_OUTPATIENT_CLINIC_OR_DEPARTMENT_OTHER): Payer: Self-pay

## 2021-10-26 MED ORDER — HYDROXYZINE PAMOATE 25 MG PO CAPS
25.0000 mg | ORAL_CAPSULE | Freq: Three times a day (TID) | ORAL | 0 refills | Status: DC | PRN
  Filled 2021-10-26: qty 30, 10d supply, fill #0

## 2021-10-27 ENCOUNTER — Telehealth: Payer: Self-pay | Admitting: Family

## 2021-10-27 NOTE — Telephone Encounter (Signed)
This 2 prescriptions were sent recently to her local pharmacy. No need for refill at this time.

## 2021-10-27 NOTE — Telephone Encounter (Signed)
Pt would like 90day supply  Medication:  Rx #: [id number]  hydrOXYzine (VISTARIL) 25 MG capsule   Rx #: [id number]  vortioxetine HBr (TRINTELLIX) 5 MG TABS   Has the patient contacted their pharmacy? Yes.   (If no, request that the patient contact the pharmacy for the refill.) (If yes, when and what did the pharmacy advise?)  Preferred Pharmacy (with phone number or street name): [facility], [place]  [address], [place] [address]  Phone:  [phone]  Fax:  [phone]   Agent: Please be advised that RX refills may take up to 3 business days. We ask that you follow-up with your pharmacy.

## 2021-11-13 DIAGNOSIS — F17210 Nicotine dependence, cigarettes, uncomplicated: Secondary | ICD-10-CM | POA: Diagnosis not present

## 2021-11-13 DIAGNOSIS — Z79899 Other long term (current) drug therapy: Secondary | ICD-10-CM | POA: Diagnosis not present

## 2021-11-13 DIAGNOSIS — M545 Low back pain, unspecified: Secondary | ICD-10-CM | POA: Diagnosis not present

## 2021-11-15 ENCOUNTER — Other Ambulatory Visit (HOSPITAL_COMMUNITY): Payer: Self-pay

## 2021-11-15 MED ORDER — OXYCODONE-ACETAMINOPHEN 10-325 MG PO TABS
ORAL_TABLET | ORAL | 0 refills | Status: DC
  Filled 2021-11-15: qty 120, 30d supply, fill #0

## 2021-11-23 ENCOUNTER — Other Ambulatory Visit: Payer: Self-pay | Admitting: Adult Health

## 2021-11-23 ENCOUNTER — Other Ambulatory Visit (HOSPITAL_BASED_OUTPATIENT_CLINIC_OR_DEPARTMENT_OTHER): Payer: Self-pay

## 2021-11-23 ENCOUNTER — Other Ambulatory Visit: Payer: Self-pay

## 2021-11-23 ENCOUNTER — Other Ambulatory Visit: Payer: Self-pay | Admitting: Family

## 2021-11-23 DIAGNOSIS — J430 Unilateral pulmonary emphysema [MacLeod's syndrome]: Secondary | ICD-10-CM

## 2021-11-23 DIAGNOSIS — C180 Malignant neoplasm of cecum: Secondary | ICD-10-CM

## 2021-11-23 DIAGNOSIS — G629 Polyneuropathy, unspecified: Secondary | ICD-10-CM

## 2021-11-23 MED ORDER — HYDROXYZINE PAMOATE 25 MG PO CAPS
25.0000 mg | ORAL_CAPSULE | Freq: Three times a day (TID) | ORAL | 0 refills | Status: DC | PRN
  Filled 2021-11-23: qty 30, 10d supply, fill #0

## 2021-11-23 MED ORDER — ALBUTEROL SULFATE HFA 108 (90 BASE) MCG/ACT IN AERS
INHALATION_SPRAY | RESPIRATORY_TRACT | 2 refills | Status: AC
  Filled 2021-11-23: qty 8.5, 25d supply, fill #0
  Filled 2022-03-01: qty 8.5, 25d supply, fill #1
  Filled 2022-06-17: qty 6.7, 25d supply, fill #2

## 2021-11-23 MED ORDER — ALPRAZOLAM 1 MG PO TABS
ORAL_TABLET | ORAL | 2 refills | Status: DC
Start: 2021-11-23 — End: 2022-02-17

## 2021-11-25 ENCOUNTER — Other Ambulatory Visit (HOSPITAL_BASED_OUTPATIENT_CLINIC_OR_DEPARTMENT_OTHER): Payer: Self-pay

## 2021-11-29 ENCOUNTER — Other Ambulatory Visit: Payer: Self-pay | Admitting: Family

## 2021-11-29 ENCOUNTER — Other Ambulatory Visit (HOSPITAL_BASED_OUTPATIENT_CLINIC_OR_DEPARTMENT_OTHER): Payer: Self-pay

## 2021-11-29 MED ORDER — ATORVASTATIN CALCIUM 20 MG PO TABS
20.0000 mg | ORAL_TABLET | Freq: Every day | ORAL | 1 refills | Status: DC
  Filled 2021-11-29: qty 90, 90d supply, fill #0
  Filled 2022-03-01: qty 90, 90d supply, fill #1

## 2021-12-11 DIAGNOSIS — Z79899 Other long term (current) drug therapy: Secondary | ICD-10-CM | POA: Diagnosis not present

## 2021-12-11 DIAGNOSIS — R030 Elevated blood-pressure reading, without diagnosis of hypertension: Secondary | ICD-10-CM | POA: Diagnosis not present

## 2021-12-11 DIAGNOSIS — F17210 Nicotine dependence, cigarettes, uncomplicated: Secondary | ICD-10-CM | POA: Diagnosis not present

## 2021-12-11 DIAGNOSIS — M545 Low back pain, unspecified: Secondary | ICD-10-CM | POA: Diagnosis not present

## 2021-12-13 ENCOUNTER — Other Ambulatory Visit (HOSPITAL_BASED_OUTPATIENT_CLINIC_OR_DEPARTMENT_OTHER): Payer: Self-pay

## 2021-12-14 ENCOUNTER — Other Ambulatory Visit (HOSPITAL_COMMUNITY): Payer: Self-pay

## 2021-12-14 ENCOUNTER — Other Ambulatory Visit (HOSPITAL_BASED_OUTPATIENT_CLINIC_OR_DEPARTMENT_OTHER): Payer: Self-pay

## 2021-12-14 MED ORDER — OXYCODONE-ACETAMINOPHEN 10-325 MG PO TABS
ORAL_TABLET | ORAL | 0 refills | Status: DC
  Filled 2021-12-14: qty 120, 30d supply, fill #0

## 2022-01-10 ENCOUNTER — Other Ambulatory Visit (HOSPITAL_COMMUNITY): Payer: Self-pay

## 2022-01-10 DIAGNOSIS — Z79899 Other long term (current) drug therapy: Secondary | ICD-10-CM | POA: Diagnosis not present

## 2022-01-10 DIAGNOSIS — M545 Low back pain, unspecified: Secondary | ICD-10-CM | POA: Diagnosis not present

## 2022-01-10 DIAGNOSIS — F17210 Nicotine dependence, cigarettes, uncomplicated: Secondary | ICD-10-CM | POA: Diagnosis not present

## 2022-01-10 MED ORDER — OXYCODONE-ACETAMINOPHEN 10-325 MG PO TABS
ORAL_TABLET | ORAL | 0 refills | Status: DC
  Filled 2022-01-10: qty 120, 20d supply, fill #0
  Filled 2022-01-12: qty 120, 30d supply, fill #0

## 2022-01-12 ENCOUNTER — Other Ambulatory Visit (HOSPITAL_COMMUNITY): Payer: Self-pay

## 2022-01-27 ENCOUNTER — Ambulatory Visit (INDEPENDENT_AMBULATORY_CARE_PROVIDER_SITE_OTHER): Payer: Medicare Other | Admitting: Pulmonary Disease

## 2022-01-27 ENCOUNTER — Other Ambulatory Visit: Payer: Self-pay

## 2022-01-27 ENCOUNTER — Encounter: Payer: Self-pay | Admitting: Pulmonary Disease

## 2022-01-27 VITALS — BP 122/70 | HR 93 | Temp 98.2°F | Ht 68.0 in | Wt 152.0 lb

## 2022-01-27 DIAGNOSIS — J439 Emphysema, unspecified: Secondary | ICD-10-CM

## 2022-01-27 DIAGNOSIS — Z720 Tobacco use: Secondary | ICD-10-CM | POA: Diagnosis not present

## 2022-01-27 DIAGNOSIS — R911 Solitary pulmonary nodule: Secondary | ICD-10-CM | POA: Diagnosis not present

## 2022-01-27 MED ORDER — NICOTINE 10 MG IN INHA: 1.0000 | RESPIRATORY_TRACT | 0 refills | Status: AC | PRN

## 2022-01-27 NOTE — Assessment & Plan Note (Signed)
Stable nodules. ?She can continue lung cancer screening CT annually, next September 2023 ?

## 2022-01-27 NOTE — Assessment & Plan Note (Signed)
Smoking cessation was again emphasized is the most important intervention. ?She was not ready to set a quit date. ?Have asked her to retrial nicotine patches at 40 mg daily and use nicotine oral inhaler for breakthrough. ?Chantix has not worked in the past ?

## 2022-01-27 NOTE — Patient Instructions (Addendum)
?  X schedule pFTs ? ?X RX for advair 250 - 1 puff bid x 1 month with 5 refils ?

## 2022-01-27 NOTE — Assessment & Plan Note (Signed)
We will repeat PFTs to assess, lung function seems to have been stable, last assessed in 2019. ?But she continues to smoke about a pack per day. ? ?She wanted a generic alternative so I have sent in a prescription for Advair 250 twice daily however strictly by guidelines she should be on a LABA LAMA/LABA combination.  We will reassess degree of airway obstruction and switch to this if lung function is worsening ?

## 2022-01-27 NOTE — Progress Notes (Signed)
? ?  Subjective:  ? ? Patient ID: Teresa Morgan, female    DOB: 1956/08/11, 66 y.o.   MRN: [id number] ? ?HPI ? 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 ?  ?PMH -Cecum adenocarcinoma Stage III B f/by Dr. Martha Clan.  S/p resection and chemo 2012-2013 ? ?Chief Complaint  ?Patient presents with  ? Follow-up  ?  1 year follow up. Pt states no changes since last year and no issue noted.   ? ?I last saw her in 2021, she was seen by APP in 2022. ?She continues to smoke about a pack per day.  She has tried Chantix in the past and nicotine patches have not worked. ?She has been on Breo for couple of years with good symptomatic benefit but [facility] will not cover and she needs an alternative ?Spirometry in the past was reviewed which has shown minimal air obstruction. ? ?We reviewed repeat CT chest which showed stable nodules ?Reviewed last oncology consultation ? ?Significant tests/ events reviewed ? ?12/2017 Spirometry  shows mild airway obstruction with ratio 69 and FEV1 77% ?  ?CT chest in 2015 with tiny nodules .  ?CT chest in 01/2016 with stable right lung nodules , additional nodules improved or resolved. ?LDCT 08/2019 RADS-2 , tiny nodules 31m on RT ? ?LDCT 09/2020  2021 lung RADS 3 probable benign findings, emphysema previous lung nodules are unchanged measuring 5.7 mm.  New nodule right upper lobe 5.4 mm ? ?CT chest/A/P 07/2021 >> stable nodules ? ?Review of Systems ?neg for any significant sore throat, dysphagia, itching, sneezing, nasal congestion or excess/ purulent secretions, fever, chills, sweats, unintended wt loss, pleuritic or exertional cp, hempoptysis, orthopnea pnd or change in chronic leg swelling. Also denies presyncope, palpitations, heartburn, abdominal pain, nausea, vomiting, diarrhea or change in bowel or urinary habits, dysuria,hematuria, rash, arthralgias, visual complaints, headache, numbness weakness or ataxia. ? ?   ?Objective:  ? Physical Exam ? ?Gen. Pleasant,  in no  distress ?ENT - no lesions, no post nasal drip ?Neck: No JVD, no thyromegaly, no carotid bruits ?Lungs: no use of accessory muscles, no dullness to percussion, decreased without rales or rhonchi  ?Cardiovascular: Rhythm regular, heart sounds  normal, no murmurs or gallops, no peripheral edema ?Musculoskeletal: No deformities, no cyanosis or clubbing , no tremors ? ? ? ?   ?Assessment & Plan:  ? ? ?

## 2022-02-07 ENCOUNTER — Other Ambulatory Visit (HOSPITAL_COMMUNITY): Payer: Self-pay

## 2022-02-07 DIAGNOSIS — F17210 Nicotine dependence, cigarettes, uncomplicated: Secondary | ICD-10-CM | POA: Diagnosis not present

## 2022-02-07 DIAGNOSIS — Z79899 Other long term (current) drug therapy: Secondary | ICD-10-CM | POA: Diagnosis not present

## 2022-02-07 DIAGNOSIS — M545 Low back pain, unspecified: Secondary | ICD-10-CM | POA: Diagnosis not present

## 2022-02-07 MED ORDER — OXYCODONE-ACETAMINOPHEN 10-325 MG PO TABS
ORAL_TABLET | ORAL | 0 refills | Status: DC
  Filled 2022-02-10: qty 120, 30d supply, fill #0

## 2022-02-08 ENCOUNTER — Other Ambulatory Visit (HOSPITAL_BASED_OUTPATIENT_CLINIC_OR_DEPARTMENT_OTHER): Payer: Self-pay

## 2022-02-10 ENCOUNTER — Other Ambulatory Visit (HOSPITAL_COMMUNITY): Payer: Self-pay

## 2022-02-17 ENCOUNTER — Other Ambulatory Visit: Payer: Self-pay | Admitting: Hematology & Oncology

## 2022-02-17 DIAGNOSIS — J430 Unilateral pulmonary emphysema [MacLeod's syndrome]: Secondary | ICD-10-CM

## 2022-02-17 DIAGNOSIS — C180 Malignant neoplasm of cecum: Secondary | ICD-10-CM

## 2022-02-17 DIAGNOSIS — G629 Polyneuropathy, unspecified: Secondary | ICD-10-CM

## 2022-03-01 ENCOUNTER — Other Ambulatory Visit (HOSPITAL_BASED_OUTPATIENT_CLINIC_OR_DEPARTMENT_OTHER): Payer: Self-pay

## 2022-03-07 ENCOUNTER — Other Ambulatory Visit (HOSPITAL_COMMUNITY): Payer: Self-pay

## 2022-03-07 DIAGNOSIS — F17210 Nicotine dependence, cigarettes, uncomplicated: Secondary | ICD-10-CM | POA: Diagnosis not present

## 2022-03-07 DIAGNOSIS — Z79899 Other long term (current) drug therapy: Secondary | ICD-10-CM | POA: Diagnosis not present

## 2022-03-07 DIAGNOSIS — R030 Elevated blood-pressure reading, without diagnosis of hypertension: Secondary | ICD-10-CM | POA: Diagnosis not present

## 2022-03-07 DIAGNOSIS — M545 Low back pain, unspecified: Secondary | ICD-10-CM | POA: Diagnosis not present

## 2022-03-07 MED ORDER — OXYCODONE-ACETAMINOPHEN 10-325 MG PO TABS
ORAL_TABLET | ORAL | 0 refills | Status: DC
  Filled 2022-03-11: qty 120, 30d supply, fill #0

## 2022-03-10 ENCOUNTER — Other Ambulatory Visit: Payer: Self-pay | Admitting: Family

## 2022-03-11 ENCOUNTER — Other Ambulatory Visit (HOSPITAL_COMMUNITY): Payer: Self-pay

## 2022-03-15 ENCOUNTER — Other Ambulatory Visit: Payer: Self-pay | Admitting: Family

## 2022-03-22 ENCOUNTER — Other Ambulatory Visit: Payer: Self-pay | Admitting: Hematology & Oncology

## 2022-03-22 DIAGNOSIS — G629 Polyneuropathy, unspecified: Secondary | ICD-10-CM

## 2022-03-22 DIAGNOSIS — C180 Malignant neoplasm of cecum: Secondary | ICD-10-CM

## 2022-03-22 DIAGNOSIS — J430 Unilateral pulmonary emphysema [MacLeod's syndrome]: Secondary | ICD-10-CM

## 2022-04-09 ENCOUNTER — Other Ambulatory Visit (HOSPITAL_COMMUNITY): Payer: Self-pay

## 2022-04-11 DIAGNOSIS — Z79899 Other long term (current) drug therapy: Secondary | ICD-10-CM | POA: Diagnosis not present

## 2022-04-11 DIAGNOSIS — F17210 Nicotine dependence, cigarettes, uncomplicated: Secondary | ICD-10-CM | POA: Diagnosis not present

## 2022-04-11 DIAGNOSIS — Z6823 Body mass index (BMI) 23.0-23.9, adult: Secondary | ICD-10-CM | POA: Diagnosis not present

## 2022-04-11 DIAGNOSIS — M545 Low back pain, unspecified: Secondary | ICD-10-CM | POA: Diagnosis not present

## 2022-04-12 ENCOUNTER — Other Ambulatory Visit (HOSPITAL_COMMUNITY): Payer: Self-pay

## 2022-04-12 ENCOUNTER — Other Ambulatory Visit (HOSPITAL_BASED_OUTPATIENT_CLINIC_OR_DEPARTMENT_OTHER): Payer: Self-pay

## 2022-04-12 MED ORDER — OXYCODONE-ACETAMINOPHEN 10-325 MG PO TABS
ORAL_TABLET | ORAL | 0 refills | Status: DC
  Filled 2022-04-12: qty 120, 28d supply, fill #0

## 2022-04-12 MED ORDER — OXYCODONE-ACETAMINOPHEN 10-325 MG PO TABS
1.0000 | ORAL_TABLET | Freq: Four times a day (QID) | ORAL | 0 refills | Status: DC | PRN
  Filled 2022-04-12: qty 120, 30d supply, fill #0

## 2022-04-13 ENCOUNTER — Other Ambulatory Visit (HOSPITAL_BASED_OUTPATIENT_CLINIC_OR_DEPARTMENT_OTHER): Payer: Self-pay

## 2022-04-14 DIAGNOSIS — Z79899 Other long term (current) drug therapy: Secondary | ICD-10-CM | POA: Diagnosis not present

## 2022-04-22 ENCOUNTER — Other Ambulatory Visit (HOSPITAL_BASED_OUTPATIENT_CLINIC_OR_DEPARTMENT_OTHER): Payer: Self-pay

## 2022-04-22 ENCOUNTER — Other Ambulatory Visit: Payer: Self-pay | Admitting: Hematology & Oncology

## 2022-04-22 DIAGNOSIS — G629 Polyneuropathy, unspecified: Secondary | ICD-10-CM

## 2022-04-22 DIAGNOSIS — J430 Unilateral pulmonary emphysema [MacLeod's syndrome]: Secondary | ICD-10-CM

## 2022-04-22 DIAGNOSIS — C180 Malignant neoplasm of cecum: Secondary | ICD-10-CM

## 2022-04-22 MED ORDER — ALPRAZOLAM 1 MG PO TABS
ORAL_TABLET | ORAL | 2 refills | Status: DC
Start: 2022-04-22 — End: 2022-07-19
  Filled 2022-04-22: qty 30, 30d supply, fill #0
  Filled 2022-05-20: qty 30, 30d supply, fill #1
  Filled 2022-06-20: qty 30, 30d supply, fill #2

## 2022-04-25 DIAGNOSIS — T7029XA Other effects of high altitude, initial encounter: Secondary | ICD-10-CM | POA: Diagnosis not present

## 2022-04-25 DIAGNOSIS — R0982 Postnasal drip: Secondary | ICD-10-CM | POA: Diagnosis not present

## 2022-04-25 DIAGNOSIS — H6691 Otitis media, unspecified, right ear: Secondary | ICD-10-CM | POA: Diagnosis not present

## 2022-04-25 DIAGNOSIS — M5441 Lumbago with sciatica, right side: Secondary | ICD-10-CM | POA: Diagnosis not present

## 2022-05-09 ENCOUNTER — Other Ambulatory Visit (HOSPITAL_COMMUNITY): Payer: Self-pay

## 2022-05-09 DIAGNOSIS — Z681 Body mass index (BMI) 19 or less, adult: Secondary | ICD-10-CM | POA: Diagnosis not present

## 2022-05-09 DIAGNOSIS — M545 Low back pain, unspecified: Secondary | ICD-10-CM | POA: Diagnosis not present

## 2022-05-09 DIAGNOSIS — Z79899 Other long term (current) drug therapy: Secondary | ICD-10-CM | POA: Diagnosis not present

## 2022-05-09 DIAGNOSIS — F17210 Nicotine dependence, cigarettes, uncomplicated: Secondary | ICD-10-CM | POA: Diagnosis not present

## 2022-05-09 DIAGNOSIS — R030 Elevated blood-pressure reading, without diagnosis of hypertension: Secondary | ICD-10-CM | POA: Diagnosis not present

## 2022-05-09 MED ORDER — OXYCODONE-ACETAMINOPHEN 10-325 MG PO TABS
ORAL_TABLET | ORAL | 0 refills | Status: DC
  Filled 2022-05-09: qty 120, 28d supply, fill #0
  Filled 2022-05-11: qty 120, 30d supply, fill #0

## 2022-05-11 ENCOUNTER — Other Ambulatory Visit (HOSPITAL_COMMUNITY): Payer: Self-pay

## 2022-05-20 ENCOUNTER — Other Ambulatory Visit (HOSPITAL_BASED_OUTPATIENT_CLINIC_OR_DEPARTMENT_OTHER): Payer: Self-pay

## 2022-05-20 ENCOUNTER — Other Ambulatory Visit: Payer: Self-pay | Admitting: Hematology & Oncology

## 2022-05-20 DIAGNOSIS — G629 Polyneuropathy, unspecified: Secondary | ICD-10-CM

## 2022-05-20 DIAGNOSIS — J430 Unilateral pulmonary emphysema [MacLeod's syndrome]: Secondary | ICD-10-CM

## 2022-05-20 DIAGNOSIS — C180 Malignant neoplasm of cecum: Secondary | ICD-10-CM

## 2022-05-24 ENCOUNTER — Other Ambulatory Visit (HOSPITAL_BASED_OUTPATIENT_CLINIC_OR_DEPARTMENT_OTHER): Payer: Self-pay

## 2022-05-24 ENCOUNTER — Other Ambulatory Visit: Payer: Self-pay | Admitting: Adult Health

## 2022-05-24 MED ORDER — FLUTICASONE-SALMETEROL 250-50 MCG/ACT IN AEPB
1.0000 | INHALATION_SPRAY | Freq: Two times a day (BID) | RESPIRATORY_TRACT | 11 refills | Status: DC
  Filled 2022-05-24: qty 60, 30d supply, fill #0
  Filled 2022-06-28: qty 60, 30d supply, fill #1

## 2022-06-01 ENCOUNTER — Other Ambulatory Visit (HOSPITAL_BASED_OUTPATIENT_CLINIC_OR_DEPARTMENT_OTHER): Payer: Self-pay

## 2022-06-01 ENCOUNTER — Telehealth: Payer: Self-pay | Admitting: Family

## 2022-06-01 ENCOUNTER — Other Ambulatory Visit: Payer: Self-pay | Admitting: Family

## 2022-06-01 MED ORDER — ATORVASTATIN CALCIUM 20 MG PO TABS
20.0000 mg | ORAL_TABLET | Freq: Every day | ORAL | 0 refills | Status: DC
  Filled 2022-06-01: qty 30, 30d supply, fill #0

## 2022-06-01 NOTE — Telephone Encounter (Signed)
Called home and cell but no answer, lvm for patient to call to schedule appointment

## 2022-06-01 NOTE — Telephone Encounter (Signed)
Please contact pt to schedule a follow up appointment.  °

## 2022-06-07 ENCOUNTER — Other Ambulatory Visit (HOSPITAL_COMMUNITY): Payer: Self-pay

## 2022-06-07 DIAGNOSIS — Z79899 Other long term (current) drug therapy: Secondary | ICD-10-CM | POA: Diagnosis not present

## 2022-06-07 DIAGNOSIS — Z6823 Body mass index (BMI) 23.0-23.9, adult: Secondary | ICD-10-CM | POA: Diagnosis not present

## 2022-06-07 DIAGNOSIS — M545 Low back pain, unspecified: Secondary | ICD-10-CM | POA: Diagnosis not present

## 2022-06-07 DIAGNOSIS — R030 Elevated blood-pressure reading, without diagnosis of hypertension: Secondary | ICD-10-CM | POA: Diagnosis not present

## 2022-06-07 DIAGNOSIS — Z681 Body mass index (BMI) 19 or less, adult: Secondary | ICD-10-CM | POA: Diagnosis not present

## 2022-06-07 DIAGNOSIS — F17210 Nicotine dependence, cigarettes, uncomplicated: Secondary | ICD-10-CM | POA: Diagnosis not present

## 2022-06-07 MED ORDER — OXYCODONE-ACETAMINOPHEN 10-325 MG PO TABS
ORAL_TABLET | ORAL | 0 refills | Status: DC
  Filled 2022-06-07 – 2022-06-08 (×2): qty 120, 30d supply, fill #0

## 2022-06-08 ENCOUNTER — Other Ambulatory Visit (HOSPITAL_BASED_OUTPATIENT_CLINIC_OR_DEPARTMENT_OTHER): Payer: Self-pay

## 2022-06-08 ENCOUNTER — Other Ambulatory Visit (HOSPITAL_COMMUNITY): Payer: Self-pay

## 2022-06-08 MED ORDER — OXYCODONE-ACETAMINOPHEN 10-325 MG PO TABS
ORAL_TABLET | ORAL | 0 refills | Status: DC
  Filled 2022-06-08: qty 120, 30d supply, fill #0

## 2022-06-17 ENCOUNTER — Other Ambulatory Visit: Payer: Self-pay | Admitting: Family

## 2022-06-17 ENCOUNTER — Ambulatory Visit (INDEPENDENT_AMBULATORY_CARE_PROVIDER_SITE_OTHER): Payer: Medicare Other | Admitting: Family

## 2022-06-17 ENCOUNTER — Other Ambulatory Visit (HOSPITAL_BASED_OUTPATIENT_CLINIC_OR_DEPARTMENT_OTHER): Payer: Self-pay

## 2022-06-17 ENCOUNTER — Encounter: Payer: Self-pay | Admitting: Family

## 2022-06-17 VITALS — BP 139/70 | HR 83 | Temp 97.7°F | Resp 16 | Ht 68.5 in | Wt 153.0 lb

## 2022-06-17 DIAGNOSIS — F419 Anxiety disorder, unspecified: Secondary | ICD-10-CM | POA: Diagnosis not present

## 2022-06-17 DIAGNOSIS — R739 Hyperglycemia, unspecified: Secondary | ICD-10-CM

## 2022-06-17 DIAGNOSIS — Z0000 Encounter for general adult medical examination without abnormal findings: Secondary | ICD-10-CM

## 2022-06-17 DIAGNOSIS — Z720 Tobacco use: Secondary | ICD-10-CM | POA: Diagnosis not present

## 2022-06-17 DIAGNOSIS — Z23 Encounter for immunization: Secondary | ICD-10-CM

## 2022-06-17 DIAGNOSIS — J439 Emphysema, unspecified: Secondary | ICD-10-CM

## 2022-06-17 DIAGNOSIS — F32A Depression, unspecified: Secondary | ICD-10-CM

## 2022-06-17 DIAGNOSIS — E785 Hyperlipidemia, unspecified: Secondary | ICD-10-CM | POA: Diagnosis not present

## 2022-06-17 DIAGNOSIS — Z1231 Encounter for screening mammogram for malignant neoplasm of breast: Secondary | ICD-10-CM

## 2022-06-17 LAB — LIPID PANEL
Cholesterol: 136 mg/dL (ref 0–200)
HDL: 35.2 mg/dL — ABNORMAL LOW (ref >39.00)
NonHDL: 101.13
Total CHOL/HDL Ratio: 4
Triglycerides: 206 mg/dL — ABNORMAL HIGH (ref 0.0–149.0)
VLDL: 41.2 mg/dL — ABNORMAL HIGH (ref 0.0–40.0)

## 2022-06-17 LAB — LDL CHOLESTEROL, DIRECT: Direct LDL: 75 mg/dL

## 2022-06-17 LAB — HEMOGLOBIN A1C: Hgb A1c MFr Bld: 6.3 % (ref 4.6–6.5)

## 2022-06-17 MED ORDER — VORTIOXETINE HBR 10 MG PO TABS
10.0000 mg | ORAL_TABLET | Freq: Every day | ORAL | 1 refills | Status: DC
  Filled 2022-06-17: qty 30, 30d supply, fill #0

## 2022-06-17 MED ORDER — VORTIOXETINE HBR 10 MG PO TABS: 10.0000 mg | ORAL_TABLET | Freq: Every day | ORAL | 1 refills | Status: DC

## 2022-06-17 MED ORDER — VORTIOXETINE HBR 5 MG PO TABS
ORAL_TABLET | Freq: Every day | ORAL | 1 refills | Status: DC
  Filled 2022-06-17: qty 90, 90d supply, fill #0

## 2022-06-17 NOTE — Assessment & Plan Note (Addendum)
Mood is only fair on trintellix. Will increase trintellix from '5mg'$  to '10mg'$ .

## 2022-06-17 NOTE — Patient Instructions (Signed)
Please increase trintellix to '10mg'$  once daily.

## 2022-06-17 NOTE — Assessment & Plan Note (Signed)
Lab Results  Component Value Date   CHOL 129 07/06/2021   HDL 39.40 07/06/2021   LDLCALC 70 07/06/2021   TRIG 100.0 07/06/2021   CHOLHDL 3 07/06/2021   Tolerating lipitor. Will check today.

## 2022-06-17 NOTE — Assessment & Plan Note (Signed)
Continue healthy diet and regular exercise. Mammo/colo up to date. Recommended covid booster/flu shot this fall.

## 2022-06-17 NOTE — Assessment & Plan Note (Signed)
Not interested in quitting 

## 2022-06-17 NOTE — Assessment & Plan Note (Signed)
Notes increased cough with change in temperature.  Will increase advair to 1 puff twice daily. She was only using once daily previously.

## 2022-06-17 NOTE — Progress Notes (Signed)
Subjective:   By signing my name below, I, Teresa Morgan, attest that this documentation has been prepared under the direction and in the presence of [name], NP 06/17/2022   Patient ID: Teresa Morgan, female    DOB: [birth year], 66 y.o.   MRN: [id number]  Chief Complaint  Patient presents with   Annual Exam         HPI Patient is in today for a comprehensive physical exam   Mood: She states that her mood is fine. She also states that she has a lot of external stressors. She is currently taking one tablet of 5 Mg of Trintellix daily and 1 Mg of Xanax PRN.  She is interested in increasing her dosage of Trintellix.  Breathing: She states that when she goes from a cold environment to a hot one, she begins to cough. She is currently taking one puff of 250-50 MCG/ACT of Advair once a day. She uses her Albuterol medication during the evening.  Cholesterol: Her cholesterol levels are normal. She is currently taking 20 Mg of Lipitor.  Lab Results  Component Value Date   CHOL 129 07/06/2021   HDL 39.40 07/06/2021   LDLCALC 70 07/06/2021   TRIG 100.0 07/06/2021   CHOLHDL 3 07/06/2021   Blood Sugars: She is interested in receiving a diabetes test.  Lab Results  Component Value Date   HGBA1C 6.0 05/26/2021   Smoking: She reports that she is not ready to quit smoking.  Hip Pain: She reports that she has hip pain. Headaches: She reports that she occasionally gets headaches. Ear Infection: She reports that she had an ear infection a couple of weeks ago. Symptoms are now resolved.     She denies having any fever, new muscle pain, joint pain , new moles, congestion, sinus pain, sore throat, palpations, cough, SOB ,wheezing,n/v/d constipation, blood in stool, dysuria, frequency, hematuria, depression, anxiety, headaches at this time  Social history: She reports no recent surgeries. She denies of any changes to her family medical history.  Colonoscopy: Last completed on  04/23/2018 Dexa: Last completed on 05/25/2020  Pap Smear: Last completed on 05/20/2020 Mammogram: Last completed on 07/13/2021 Immunizations: She is due for a tetanus vaccine on 03/13/2023. She is interested in receiving the Pneumonia vaccine.  Diet: She is maintaining a healthy diet.  Exercise: She states that she rides her bike every year.   Health Maintenance Due  Topic Date Due   Hepatitis C Screening  Never done   COVID-19 Vaccine (4 - Booster for Moderna series) 05/25/2021   INFLUENZA VACCINE  06/14/2022    Past Medical History:  Diagnosis Date   Anemia    Anxiety    Arthritis    Cataract    bilat removed   Colon cancer ([facility]) 2013   cecal cancer   COPD with emphysema ([facility]) 06/18/2012   Depression    GERD (gastroesophageal reflux disease)    Hyperlipidemia 05/27/2021   Neuromuscular disorder (HCC)    neuropathy from chemo   Peptic ulcer    Pneumonia    Shortness of breath dyspnea    with exertion    Past Surgical History:  Procedure Laterality Date   APPENDECTOMY  07/07/11   BREAST EXCISIONAL BIOPSY Right 03/15/2018   COLON RESECTION  2012   for colon cancer   COLONOSCOPY     EYE SURGERY Bilateral    cataract surgery with lens implant   heel spurs Right    hemrrhoids  PLANTAR FASCIA SURGERY Right 2009   PORT-A-CATH REMOVAL Left 04/19/2016   Procedure: REMOVAL PORT-A-CATH;  Surgeon: [name], MD;  Location: [facility];  Service: General;  Laterality: Left;   portacath     RADIOACTIVE SEED GUIDED EXCISIONAL BREAST BIOPSY Right 03/15/2018   Procedure: RADIOACTIVE SEED GUIDED EXCISIONAL BREAST BIOPSY;  Surgeon: [name], MD;  Location: [facility];  Service: General;  Laterality: Right;   TUBAL LIGATION  07/1980    Family History  Problem Relation Age of Onset   Diabetes Mother    Colon cancer Cousin    Colon cancer Cousin    Thalassemia Daughter    Colon polyps Neg Hx    Rectal cancer Neg Hx    Stomach cancer Neg Hx     Social History    Socioeconomic History   Marital status: Married    Spouse name: Not on file   Number of children: 3   Years of education: Not on file   Highest education level: Not on file  Occupational History   Occupation: Armed forces operational officer: [employer]  Tobacco Use   Smoking status: Every Day    Packs/day: 1.00    Years: 45.00    Total pack years: 45.00    Types: Cigarettes    Start date: 04/23/1986   Smokeless tobacco: Never  Substance and Sexual Activity   Alcohol use: Yes    Alcohol/week: 0.0 standard drinks of alcohol    Comment: social   Drug use: No   Sexual activity: Not Currently    Birth control/protection: Surgical  Other Topics Concern   Not on file  Social History Narrative   3 children- all daughter- live locally   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, minerals   Married      Social Determinants of Radio broadcast assistant Strain: [place]  (09/06/2021)   Overall Financial Resource Strain (CARDIA)    Difficulty of Paying Living Expenses: Not hard at all  Food Insecurity: No [place] (09/06/2021)   Hunger Vital Sign    Worried About Running Out of Food in the Last Year: Never true    [place] in the Last Year: Never true  Transportation Needs: No Transportation Needs (09/06/2021)   PRAPARE - Hydrologist (Medical): No    Lack of Transportation (Non-Medical): No  Physical Activity: Inactive (09/06/2021)   Exercise Vital Sign    Days of Exercise per Week: 0 days    Minutes of Exercise per Session: 0 min  Stress: No Stress Concern Present (09/06/2021)   [place]    Feeling of Stress : Not at all  Social Connections: Socially Isolated (09/06/2021)   Social Connection and Isolation Panel [NHANES]    Frequency of Communication with Friends and Family: Once a week    Frequency of Social Gatherings with Friends and Family: Once a week    Attends Religious Services:  Never    Marine scientist or Organizations: No    Attends Archivist Meetings: Never    Marital Status: Married  Human resources officer Violence: Not At Risk (09/06/2021)   Humiliation, Afraid, Rape, and Kick questionnaire    Fear of Current or Ex-Partner: No    Emotionally Abused: No    Physically Abused: No    Sexually Abused: No    Outpatient Medications Prior to Visit  Medication Sig Dispense Refill  albuterol (VENTOLIN HFA) 108 (90 Base) MCG/ACT inhaler INHALE 1 - 2 PUFFS INTO THE LUNGS EVERY 6 HOURS AS NEEDED FOR WHEEZING OR SHORTNESS OF BREATH 8.5 g 2   ALPRAZolam (XANAX) 1 MG tablet TAKE 1 TABLET BY MOUTH EVERY NIGHT AT BEDTIME AS NEEDED FOR ANXIETY OR SLEEP 30 tablet 2   ALPRAZolam (XANAX) 1 MG tablet TAKE 1 TABLET BY MOUTH EVERY NIGHT AT BEDTIME AS NEEDED FOR ANXIETY OR SLEEP 30 tablet 0   atorvastatin (LIPITOR) 20 MG tablet Take 1 tablet (20 mg total) by mouth daily. 30 tablet 0   Calcium-Vitamin D 600-125 MG-UNIT TABS Take 1 tablet by mouth daily.     cyclobenzaprine (FLEXERIL) 10 MG tablet Take 1 tablet by mouth at bedtime 30 tablet 6   dimenhyDRINATE (DRAMAMINE) 50 MG tablet Take 50 mg by mouth every 8 (eight) hours as needed.     EPINEPHrine (EPIPEN 2-PAK) 0.3 mg/0.3 mL IJ SOAJ injection Inject 0.3 mg into the muscle as needed for anaphylaxis. 2 each 0   fluticasone-salmeterol (ADVAIR) 250-50 MCG/ACT AEPB Inhale 1 puff into the lungs every 12 (twelve) hours. 60 each 11   hydrOXYzine (VISTARIL) 25 MG capsule Take 1 capsule (25 mg total) by mouth every 8 (eight) hours as needed. 30 capsule 0   Multiple Vitamin (MULTIVITAMIN) tablet Take 1 tablet by mouth daily.     nicotine (NICOTROL) 10 MG inhaler Inhale 1 Cartridge (1 continuous puffing total) into the lungs as needed for smoking cessation. 150 each 0   oxyCODONE-acetaminophen (PERCOCET) 10-325 MG tablet Take 1 tablet by mouth 4 times daily as needed (02/10/22) 120 tablet 0   oxyCODONE-acetaminophen (PERCOCET)  10-325 MG tablet Take 1 tablet by mouth 4 (four) times daily as needed. 120 tablet 0   oxyCODONE-acetaminophen (PERCOCET) 10-325 MG tablet Take 1 tablet by mouth four times daily as needed 120 tablet 0   oxyCODONE-acetaminophen (PERCOCET) 10-325 MG tablet Take 1 (one) Tablet by mouth four times daily, as needed 120 tablet 0   TURMERIC PO Take 1 tablet by mouth daily.     vitamin E 200 UNIT capsule Take 200 Units by mouth daily.     vortioxetine HBr (TRINTELLIX) 5 MG TABS tablet TAKE 1 TABLET BY MOUTH ONCE DAILY 90 tablet 1   No facility-administered medications prior to visit.    Allergies  Allergen Reactions   Aspirin     Upset stomach   Cymbalta [Duloxetine Hcl] Swelling    Swollen lips    Prozac [Fluoxetine Hcl] Swelling    Lip swelling   Wellbutrin [Bupropion] Swelling    Lip swelling    Review of Systems  Constitutional:  Negative for fever.  HENT:  Negative for congestion, sinus pain and sore throat.   Respiratory:  Negative for cough, shortness of breath and wheezing.   Cardiovascular:  Negative for palpitations.  Gastrointestinal:  Negative for blood in stool, constipation, diarrhea, nausea and vomiting.  Genitourinary:  Negative for dysuria, frequency and hematuria.  Musculoskeletal:  Negative for joint pain and myalgias.  Skin:        (-) [place]  Neurological:  Negative for headaches.  Psychiatric/Behavioral:  Negative for depression. The patient is not nervous/anxious.        Objective:    Physical Exam Constitutional:      General: She is not in acute distress.    Appearance: Normal appearance. She is not ill-appearing.  HENT:     Head: Normocephalic and atraumatic.     Right Ear: Tympanic  membrane, ear canal and external ear normal.     Left Ear: Tympanic membrane, ear canal and external ear normal.     Mouth/Throat:     Pharynx: No oropharyngeal exudate.  Eyes:     Extraocular Movements: Extraocular movements intact.     Pupils: Pupils are equal,  round, and reactive to light.  Neck:     Thyroid: No thyromegaly.  Cardiovascular:     Rate and Rhythm: Normal rate and regular rhythm.     Heart sounds: Normal heart sounds. No murmur heard.    No gallop.  Pulmonary:     Effort: Pulmonary effort is normal. No respiratory distress.     Breath sounds: Normal breath sounds. No wheezing or rales.  Chest:  Breasts:    Breasts are symmetrical.     Right: Inverted nipple present. No mass.     Left: No inverted nipple or mass.  Abdominal:     General: Bowel sounds are normal. There is no distension.     Palpations: Abdomen is soft.     Tenderness: There is no abdominal tenderness. There is no guarding.  Musculoskeletal:     Comments: 5/5 strength in both upper and lower extremities    Lymphadenopathy:     Cervical: No cervical adenopathy.  Skin:    General: Skin is warm and dry.  Neurological:     Mental Status: She is alert and oriented to person, place, and time.     Deep Tendon Reflexes:     Reflex Scores:      Patellar reflexes are 2+ on the right side and 2+ on the left side. Psychiatric:        Mood and Affect: Mood normal.        Behavior: Behavior normal.        Judgment: Judgment normal.     BP 139/70 (BP Location: Right Arm, Patient Position: Sitting, Cuff Size: Small)   Pulse 83   Temp 97.7 F (36.5 C) (Oral)   Resp 16   Ht 5' 8.5" (1.74 m)   Wt 153 lb (69.4 kg)   SpO2 98%   BMI 22.93 kg/m  Wt Readings from Last 3 Encounters:  06/17/22 153 lb (69.4 kg)  01/27/22 152 lb (68.9 kg)  09/06/21 148 lb (67.1 kg)       Assessment & Plan:   Problem List Items Addressed This Visit       Unprioritized   Tobacco abuse    Not interested in quitting.       Preventative health care - Primary    Continue healthy diet and regular exercise. Mammo/colo up to date. Recommended covid booster/flu shot this fall.        Hyperlipidemia    Lab Results  Component Value Date   CHOL 129 07/06/2021   HDL 39.40  07/06/2021   LDLCALC 70 07/06/2021   TRIG 100.0 07/06/2021   CHOLHDL 3 07/06/2021  Tolerating lipitor. Will check today.        Relevant Orders   Lipid panel   COPD with emphysema, Gold B    Notes increased cough with change in temperature.  Will increase advair to 1 puff twice daily. She was only using once daily previously.      Anxiety and depression    Mood is only fair on trintellix. Will increase trintellix from '5mg'$  to '10mg'$ .       Relevant Medications   vortioxetine HBr (TRINTELLIX) 10 MG TABS tablet   Other Visit Diagnoses  Hyperglycemia       Relevant Orders   Hemoglobin A1c   Encounter for screening mammogram for malignant neoplasm of breast       Relevant Orders   MM 3D SCREEN BREAST BILATERAL   Need for pneumococcal vaccine       Relevant Orders   Pneumococcal conjugate vaccine 20-valent (Prevnar 20) (Completed)        Meds ordered this encounter  Medications   DISCONTD: vortioxetine HBr (TRINTELLIX) 10 MG TABS tablet    Sig: Take 1 tablet (10 mg total) by mouth daily.    Dispense:  90 tablet    Refill:  1    Order Specific Question:   Supervising Provider    Answer:   Penni Homans A [[id number]]   vortioxetine HBr (TRINTELLIX) 10 MG TABS tablet    Sig: Take 1 tablet (10 mg total) by mouth daily.    Dispense:  90 tablet    Refill:  1    Order Specific Question:   Supervising Provider    Answer:   Penni Homans A [[id number]]    I, Nance Pear, NP, personally preformed the services described in this documentation.  All medical record entries made by the scribe were at my direction and in my presence.  I have reviewed the chart and discharge instructions (if applicable) and agree that the record reflects my personal performance and is accurate and complete. 06/17/2022   I,Amber Collins,acting as a scribe for Nance Pear, NP.,have documented all relevant documentation on the behalf of Nance Pear, NP,as directed by  Nance Pear, NP while in the presence of Nance Pear, NP.    Nance Pear, NP

## 2022-06-18 ENCOUNTER — Encounter: Payer: Self-pay | Admitting: Family

## 2022-06-18 DIAGNOSIS — R7303 Prediabetes: Secondary | ICD-10-CM | POA: Insufficient documentation

## 2022-06-20 ENCOUNTER — Other Ambulatory Visit (HOSPITAL_BASED_OUTPATIENT_CLINIC_OR_DEPARTMENT_OTHER): Payer: Self-pay

## 2022-06-21 ENCOUNTER — Other Ambulatory Visit (HOSPITAL_COMMUNITY): Payer: Self-pay

## 2022-06-21 DIAGNOSIS — Z79899 Other long term (current) drug therapy: Secondary | ICD-10-CM | POA: Diagnosis not present

## 2022-06-21 DIAGNOSIS — Z681 Body mass index (BMI) 19 or less, adult: Secondary | ICD-10-CM | POA: Diagnosis not present

## 2022-06-21 DIAGNOSIS — M545 Low back pain, unspecified: Secondary | ICD-10-CM | POA: Diagnosis not present

## 2022-06-21 DIAGNOSIS — R030 Elevated blood-pressure reading, without diagnosis of hypertension: Secondary | ICD-10-CM | POA: Diagnosis not present

## 2022-06-21 DIAGNOSIS — F17210 Nicotine dependence, cigarettes, uncomplicated: Secondary | ICD-10-CM | POA: Diagnosis not present

## 2022-06-21 MED ORDER — OXYCODONE-ACETAMINOPHEN 10-325 MG PO TABS
1.0000 | ORAL_TABLET | Freq: Four times a day (QID) | ORAL | 0 refills | Status: DC | PRN
  Filled 2022-07-07: qty 120, 30d supply, fill #0

## 2022-06-28 ENCOUNTER — Other Ambulatory Visit (HOSPITAL_BASED_OUTPATIENT_CLINIC_OR_DEPARTMENT_OTHER): Payer: Self-pay

## 2022-06-30 ENCOUNTER — Other Ambulatory Visit (HOSPITAL_BASED_OUTPATIENT_CLINIC_OR_DEPARTMENT_OTHER): Payer: Self-pay

## 2022-06-30 ENCOUNTER — Other Ambulatory Visit: Payer: Self-pay | Admitting: Family

## 2022-06-30 MED ORDER — ATORVASTATIN CALCIUM 20 MG PO TABS
20.0000 mg | ORAL_TABLET | Freq: Every day | ORAL | 0 refills | Status: DC
  Filled 2022-06-30: qty 30, 30d supply, fill #0

## 2022-07-06 ENCOUNTER — Other Ambulatory Visit (HOSPITAL_COMMUNITY): Payer: Self-pay

## 2022-07-07 ENCOUNTER — Other Ambulatory Visit (HOSPITAL_COMMUNITY): Payer: Self-pay

## 2022-07-19 ENCOUNTER — Other Ambulatory Visit (HOSPITAL_BASED_OUTPATIENT_CLINIC_OR_DEPARTMENT_OTHER): Payer: Self-pay

## 2022-07-19 ENCOUNTER — Other Ambulatory Visit: Payer: Self-pay | Admitting: Hematology & Oncology

## 2022-07-19 ENCOUNTER — Inpatient Hospital Stay (HOSPITAL_BASED_OUTPATIENT_CLINIC_OR_DEPARTMENT_OTHER): Admission: RE | Admit: 2022-07-19 | Payer: Medicare Other | Source: Ambulatory Visit

## 2022-07-19 DIAGNOSIS — J430 Unilateral pulmonary emphysema [MacLeod's syndrome]: Secondary | ICD-10-CM

## 2022-07-19 DIAGNOSIS — G629 Polyneuropathy, unspecified: Secondary | ICD-10-CM

## 2022-07-19 DIAGNOSIS — C180 Malignant neoplasm of cecum: Secondary | ICD-10-CM

## 2022-07-19 MED ORDER — ALPRAZOLAM 1 MG PO TABS
ORAL_TABLET | ORAL | 2 refills | Status: DC
  Filled 2022-07-19: qty 30, 30d supply, fill #0
  Filled 2022-08-18: qty 30, 30d supply, fill #1

## 2022-07-25 ENCOUNTER — Inpatient Hospital Stay: Payer: Medicare Other | Attending: Hematology & Oncology

## 2022-07-25 ENCOUNTER — Inpatient Hospital Stay (HOSPITAL_BASED_OUTPATIENT_CLINIC_OR_DEPARTMENT_OTHER): Payer: Medicare Other | Admitting: Hematology & Oncology

## 2022-07-25 ENCOUNTER — Encounter: Payer: Self-pay | Admitting: Hematology & Oncology

## 2022-07-25 ENCOUNTER — Other Ambulatory Visit: Payer: Self-pay | Admitting: *Deleted

## 2022-07-25 VITALS — BP 143/79 | HR 96 | Temp 98.2°F | Resp 20 | Ht 68.0 in | Wt 153.4 lb

## 2022-07-25 DIAGNOSIS — C180 Malignant neoplasm of cecum: Secondary | ICD-10-CM

## 2022-07-25 DIAGNOSIS — G620 Drug-induced polyneuropathy: Secondary | ICD-10-CM | POA: Diagnosis not present

## 2022-07-25 DIAGNOSIS — F17210 Nicotine dependence, cigarettes, uncomplicated: Secondary | ICD-10-CM | POA: Insufficient documentation

## 2022-07-25 DIAGNOSIS — R21 Rash and other nonspecific skin eruption: Secondary | ICD-10-CM | POA: Insufficient documentation

## 2022-07-25 LAB — CBC WITH DIFFERENTIAL (CANCER CENTER ONLY)
Abs Immature Granulocytes: 0.04 10*3/uL (ref 0.00–0.07)
Basophils Absolute: 0 10*3/uL (ref 0.0–0.1)
Basophils Relative: 0 %
Eosinophils Absolute: 0.1 10*3/uL (ref 0.0–0.5)
Eosinophils Relative: 1 %
HCT: 45 % (ref 36.0–46.0)
Hemoglobin: 14.1 g/dL (ref 12.0–15.0)
Immature Granulocytes: 0 %
Lymphocytes Relative: 28 %
Lymphs Abs: 2.6 10*3/uL (ref 0.7–4.0)
MCH: 23.3 pg — ABNORMAL LOW (ref 26.0–34.0)
MCHC: 31.3 g/dL (ref 30.0–36.0)
MCV: 74.5 fL — ABNORMAL LOW (ref 80.0–100.0)
Monocytes Absolute: 0.4 10*3/uL (ref 0.1–1.0)
Monocytes Relative: 5 %
Neutro Abs: 5.9 10*3/uL (ref 1.7–7.7)
Neutrophils Relative %: 66 %
Platelet Count: 159 10*3/uL (ref 150–400)
RBC: 6.04 MIL/uL — ABNORMAL HIGH (ref 3.87–5.11)
RDW: 15.2 % (ref 11.5–15.5)
WBC Count: 9 10*3/uL (ref 4.0–10.5)
nRBC: 0 % (ref 0.0–0.2)

## 2022-07-25 LAB — COMPREHENSIVE METABOLIC PANEL
ALT: 15 U/L (ref 0–44)
AST: 17 U/L (ref 15–41)
Albumin: 4.5 g/dL (ref 3.5–5.0)
Alkaline Phosphatase: 82 U/L (ref 38–126)
Anion gap: 7 (ref 5–15)
BUN: 14 mg/dL (ref 8–23)
CO2: 31 mmol/L (ref 22–32)
Calcium: 9.8 mg/dL (ref 8.9–10.3)
Chloride: 103 mmol/L (ref 98–111)
Creatinine, Ser: 0.99 mg/dL (ref 0.44–1.00)
GFR, Estimated: >60 mL/min (ref >60)
Glucose, Bld: 93 mg/dL (ref 70–99)
Potassium: 4.1 mmol/L (ref 3.5–5.1)
Sodium: 141 mmol/L (ref 135–145)
Total Bilirubin: 0.5 mg/dL (ref 0.3–1.2)
Total Protein: 7.3 g/dL (ref 6.5–8.1)

## 2022-07-25 LAB — LACTATE DEHYDROGENASE: LDH: 202 U/L — ABNORMAL HIGH (ref 98–192)

## 2022-07-25 LAB — CEA (ACCESS): CEA (CHCC): 6.71 ng/mL — ABNORMAL HIGH (ref 0.00–5.00)

## 2022-07-25 NOTE — Progress Notes (Signed)
Hematology and Oncology Follow Up Visit  Teresa Morgan [id number] 1956-05-17 66 y.o. 07/25/2022   Principle Diagnosis:  1. Stage IIIB (T2 N2 M0) adenocarcinoma of the cecum. 2. Neuropathy secondary to chemotherapy.  Current Therapy:   Observation     Interim History:  Ms.  Morgan is back for followup.  We see her yearly.  She is still smoking but trying to cut back.  She is doing a good job with this.  She really looks good.  She really has had no complaints since we last saw her.  She has had no problems with cough or shortness of breath.  She has had no nausea or vomiting.  There is been no change in bowel or bladder habits.  She has had no bleeding.  She does have a little bit of a rash on her flexor aspect of the elbow.  I will know if this might be a little bit of eczema.  I did put a little cortisone cream on there.  She has had no fever.  She has had no problems with COVID.  Overall, I would say performance status is probably ECOG 0.    Medications:  Current Outpatient Medications:    albuterol (VENTOLIN HFA) 108 (90 Base) MCG/ACT inhaler, INHALE 1 - 2 PUFFS INTO THE LUNGS EVERY 6 HOURS AS NEEDED FOR WHEEZING OR SHORTNESS OF BREATH, Disp: 8.5 g, Rfl: 2   ALPRAZolam (XANAX) 1 MG tablet, TAKE 1 TABLET BY MOUTH EVERY NIGHT AT BEDTIME AS NEEDED FOR ANXIETY OR SLEEP, Disp: 30 tablet, Rfl: 2   atorvastatin (LIPITOR) 20 MG tablet, Take 1 tablet (20 mg total) by mouth daily., Disp: 30 tablet, Rfl: 0   Calcium-Vitamin D 600-125 MG-UNIT TABS, Take 1 tablet by mouth daily., Disp: , Rfl:    fluticasone-salmeterol (ADVAIR) 250-50 MCG/ACT AEPB, Inhale 1 puff into the lungs every 12 (twelve) hours., Disp: 60 each, Rfl: 11   Multiple Vitamin (MULTIVITAMIN) tablet, Take 1 tablet by mouth daily., Disp: , Rfl:    nicotine (NICOTROL) 10 MG inhaler, Inhale 1 Cartridge (1 continuous puffing total) into the lungs as needed for smoking cessation., Disp: 150 each, Rfl: 0   oxyCODONE-acetaminophen  (PERCOCET) 10-325 MG tablet, Take 1 tablet by mouth 4 (four) times daily as needed., Disp: 120 tablet, Rfl: 0   TURMERIC PO, Take 1 tablet by mouth daily., Disp: , Rfl:    vitamin E 200 UNIT capsule, Take 200 Units by mouth daily., Disp: , Rfl:    vortioxetine HBr (TRINTELLIX) 10 MG TABS tablet, Take 1 tablet (10 mg total) by mouth daily., Disp: 90 tablet, Rfl: 1   ALPRAZolam (XANAX) 1 MG tablet, TAKE 1 TABLET BY MOUTH EVERY NIGHT AT BEDTIME AS NEEDED FOR ANXIETY OR SLEEP, Disp: 30 tablet, Rfl: 0   EPINEPHrine (EPIPEN 2-PAK) 0.3 mg/0.3 mL IJ SOAJ injection, Inject 0.3 mg into the muscle as needed for anaphylaxis. (Patient not taking: Reported on 07/25/2022), Disp: 2 each, Rfl: 0  Allergies:  Allergies  Allergen Reactions   Aspirin     Upset stomach   Cymbalta [Duloxetine Hcl] Swelling    Swollen lips    Prozac [Fluoxetine Hcl] Swelling    Lip swelling   Wellbutrin [Bupropion] Swelling    Lip swelling    Past Medical History, Surgical history, Social history, and Family History were reviewed and updated.  Review of Systems: Review of Systems  Constitutional: Negative.   HENT: Negative.    Eyes: Negative.   Respiratory: Negative.  Cardiovascular: Negative.   Gastrointestinal:  Positive for nausea.  Genitourinary: Negative.   Musculoskeletal: Negative.   Skin: Negative.   Neurological:  Positive for tingling.  Endo/Heme/Allergies: Negative.   Psychiatric/Behavioral: Negative.      Physical Exam:  height is '5\' 8"'$  (1.727 m) and weight is 153 lb 6.4 oz (69.6 kg). Her oral temperature is 98.2 F (36.8 C). Her blood pressure is 143/79 (abnormal) and her pulse is 96. Her respiration is 20 and oxygen saturation is 96%.   Physical Exam Vitals reviewed.  HENT:     Head: Normocephalic and atraumatic.  Eyes:     Pupils: Pupils are equal, round, and reactive to light.  Cardiovascular:     Rate and Rhythm: Normal rate and regular rhythm.     Heart sounds: Normal heart sounds.   Pulmonary:     Effort: Pulmonary effort is normal.     Breath sounds: Normal breath sounds.  Abdominal:     General: Bowel sounds are normal.     Palpations: Abdomen is soft.  Musculoskeletal:        General: No tenderness or deformity. Normal range of motion.     Cervical back: Normal range of motion.  Lymphadenopathy:     Cervical: No cervical adenopathy.  Skin:    General: Skin is warm and dry.     Findings: No erythema or rash.  Neurological:     Mental Status: She is alert and oriented to person, place, and time.  Psychiatric:        Behavior: Behavior normal.        Thought Content: Thought content normal.        Judgment: Judgment normal.      Lab Results  Component Value Date   WBC 9.0 07/25/2022   HGB 14.1 07/25/2022   HCT 45.0 07/25/2022   MCV 74.5 (L) 07/25/2022   PLT 159 07/25/2022     Chemistry      Component Value Date/Time   NA 141 07/25/2022 1139   NA 141 07/24/2017 1147   NA 142 09/21/2015 1306   K 4.1 07/25/2022 1139   K 3.3 07/24/2017 1147   K 3.8 09/21/2015 1306   CL 103 07/25/2022 1139   CL 103 07/24/2017 1147   CO2 31 07/25/2022 1139   CO2 29 07/24/2017 1147   CO2 27 09/21/2015 1306   BUN 14 07/25/2022 1139   BUN 8 07/24/2017 1147   BUN 9.8 09/21/2015 1306   CREATININE 0.99 07/25/2022 1139   CREATININE 0.90 07/23/2021 1124   CREATININE 0.9 07/24/2017 1147   CREATININE 0.8 09/21/2015 1306      Component Value Date/Time   CALCIUM 9.8 07/25/2022 1139   CALCIUM 9.0 07/24/2017 1147   CALCIUM 9.2 09/21/2015 1306   ALKPHOS 82 07/25/2022 1139   ALKPHOS 81 07/24/2017 1147   ALKPHOS 77 09/21/2015 1306   AST 17 07/25/2022 1139   AST 16 07/23/2021 1124   AST 15 09/21/2015 1306   ALT 15 07/25/2022 1139   ALT 16 07/23/2021 1124   ALT 20 07/24/2017 1147   ALT 13 09/21/2015 1306   BILITOT 0.5 07/25/2022 1139   BILITOT 0.5 07/23/2021 1124   BILITOT 0.40 09/21/2015 1306      Impression and Plan: Teresa Morgan is 66 year-old female with  stage IIIB colon cancer. This was of the cecum. She underwent resection back in September of 2012. She had 4 positive lymph nodes. She completed chemotherapy in March of 2013.  I doubt  that colon cancer recurrence will ever be a problem for her.  With her smoking, one has to worry about the possibility of lung cancer.  We will now get her back in 1 year   I am just happy that her quality of life is doing well.  She is very active.  She really enjoys her grandchildren.  Volanda Napoleon, MD 9/11/202312:21 PM

## 2022-07-28 ENCOUNTER — Other Ambulatory Visit (HOSPITAL_BASED_OUTPATIENT_CLINIC_OR_DEPARTMENT_OTHER): Payer: Self-pay

## 2022-07-28 ENCOUNTER — Other Ambulatory Visit: Payer: Self-pay | Admitting: Family

## 2022-07-28 MED ORDER — ATORVASTATIN CALCIUM 20 MG PO TABS
20.0000 mg | ORAL_TABLET | Freq: Every day | ORAL | 0 refills | Status: DC
  Filled 2022-07-28: qty 90, 90d supply, fill #0

## 2022-07-29 ENCOUNTER — Other Ambulatory Visit (HOSPITAL_BASED_OUTPATIENT_CLINIC_OR_DEPARTMENT_OTHER): Payer: Self-pay

## 2022-07-29 ENCOUNTER — Telehealth: Payer: Self-pay | Admitting: Family

## 2022-07-29 ENCOUNTER — Ambulatory Visit (INDEPENDENT_AMBULATORY_CARE_PROVIDER_SITE_OTHER): Payer: Medicare Other | Admitting: Family

## 2022-07-29 VITALS — BP 118/73 | HR 94 | Temp 98.0°F | Resp 16 | Wt 151.0 lb

## 2022-07-29 DIAGNOSIS — F32A Depression, unspecified: Secondary | ICD-10-CM | POA: Diagnosis not present

## 2022-07-29 DIAGNOSIS — Z23 Encounter for immunization: Secondary | ICD-10-CM | POA: Diagnosis not present

## 2022-07-29 DIAGNOSIS — M545 Low back pain, unspecified: Secondary | ICD-10-CM

## 2022-07-29 DIAGNOSIS — L2082 Flexural eczema: Secondary | ICD-10-CM | POA: Insufficient documentation

## 2022-07-29 DIAGNOSIS — G8929 Other chronic pain: Secondary | ICD-10-CM

## 2022-07-29 DIAGNOSIS — F419 Anxiety disorder, unspecified: Secondary | ICD-10-CM | POA: Diagnosis not present

## 2022-07-29 MED ORDER — VORTIOXETINE HBR 5 MG PO TABS
5.0000 mg | ORAL_TABLET | Freq: Every day | ORAL | 2 refills | Status: DC
  Filled 2022-07-29 (×2): qty 30, 30d supply, fill #0

## 2022-07-29 MED ORDER — CYCLOBENZAPRINE HCL 5 MG PO TABS
5.0000 mg | ORAL_TABLET | Freq: Three times a day (TID) | ORAL | 1 refills | Status: DC | PRN
  Filled 2022-07-29: qty 30, 10d supply, fill #0
  Filled 2022-08-29: qty 30, 10d supply, fill #1

## 2022-07-29 MED ORDER — VORTIOXETINE HBR 5 MG PO TABS: 5.0000 mg | ORAL_TABLET | Freq: Every day | ORAL | 2 refills | Status: DC

## 2022-07-29 MED ORDER — BETAMETHASONE DIPROPIONATE 0.05 % EX CREA
TOPICAL_CREAM | Freq: Two times a day (BID) | CUTANEOUS | 0 refills | Status: DC
  Filled 2022-07-29: qty 30, 30d supply, fill #0

## 2022-07-29 NOTE — Assessment & Plan Note (Signed)
New. Discussed importance of good moisturizer such as cetaphil/cereve or eucerin ointment. Apply betamethasone cream bid to affected areas prn.

## 2022-07-29 NOTE — Telephone Encounter (Signed)
Please cancel trintellix '5mg'$  rx at [facility]. tks

## 2022-07-29 NOTE — Assessment & Plan Note (Signed)
Felt increased depression on trintellix so she cut it back to '5mg'$ .  She is doing fair on this dose and is also working with a Social worker. We did discuss referral to a psychiatrist but she declines at this time.

## 2022-07-29 NOTE — Progress Notes (Signed)
Subjective:   By signing my name below, I, Teresa Morgan, attest that this documentation has been prepared under the direction and in the presence of [name], NP 07/29/2022     Patient ID: Teresa Morgan, female    DOB: [birth year], 66 y.o.   MRN: [id number]  Chief Complaint  Patient presents with   Anxiety    Follow up anxiety and depression, started trintelix 10 mg dose but "it was too strong, started cutting in 1/2"    HPI Patient is in today for an office visit  Mood: Her Trintellix was increased from 5 Mg to 10 Mg. She reports that the 10 Mg of the medication decreased her mood. She has since decreased her medication back to 5 Mg. She is currently seeing a counselor for her symptoms. She is not interested in being referred to a psychiatrist at this time. Muscle Relaxant/Back Pain: She states that her symptoms cause her to have restless sleep. She is requesting to receive Flexeril for her muscle relaxant. She is currently taking 10 - 325 Mg of Percocet about 2 - 3 times a day.  Eczema: She is requesting to receive medication for her eczema at her elbow pit.  Health Maintenance Due  Topic Date Due   Hepatitis C Screening  Never done   COVID-19 Vaccine (4 - Moderna risk series) 05/25/2021    Past Medical History:  Diagnosis Date   Anemia    Anxiety    Arthritis    Borderline diabetes    Cataract    bilat removed   Colon cancer ([place]) 2013   cecal cancer   COPD with emphysema ([place]) 06/18/2012   Depression    GERD (gastroesophageal reflux disease)    Hyperlipidemia 05/27/2021   Neuromuscular disorder (HCC)    neuropathy from chemo   Peptic ulcer    Pneumonia    Shortness of breath dyspnea    with exertion    Past Surgical History:  Procedure Laterality Date   APPENDECTOMY  07/07/11   BREAST EXCISIONAL BIOPSY Right 03/15/2018   COLON RESECTION  2012   for colon cancer   COLONOSCOPY     EYE SURGERY Bilateral    cataract surgery with lens implant    heel spurs Right    hemrrhoids     PLANTAR FASCIA SURGERY Right 2009   PORT-A-CATH REMOVAL Left 04/19/2016   Procedure: REMOVAL PORT-A-CATH;  Surgeon: [name], MD;  Location: [facility];  Service: General;  Laterality: Left;   portacath     RADIOACTIVE SEED GUIDED EXCISIONAL BREAST BIOPSY Right 03/15/2018   Procedure: RADIOACTIVE SEED GUIDED EXCISIONAL BREAST BIOPSY;  Surgeon: [name], MD;  Location: [facility];  Service: General;  Laterality: Right;   TUBAL LIGATION  07/1980    Family History  Problem Relation Age of Onset   Diabetes Mother    Colon cancer Cousin    Colon cancer Cousin    Thalassemia Daughter    Colon polyps Neg Hx    Rectal cancer Neg Hx    Stomach cancer Neg Hx     Social History   Socioeconomic History   Marital status: Married    Spouse name: Not on file   Number of children: 3   Years of education: Not on file   Highest education level: Not on file  Occupational History   Occupation: Armed forces operational officer: [employer]  Tobacco Use   Smoking status: Every Day    Packs/day:  1.00    Years: 45.00    Total pack years: 45.00    Types: Cigarettes    Start date: 04/23/1986   Smokeless tobacco: Never  Vaping Use   Vaping Use: Never used  Substance and Sexual Activity   Alcohol use: Yes    Alcohol/week: 0.0 standard drinks of alcohol    Comment: social   Drug use: No   Sexual activity: Not Currently    Birth control/protection: Surgical  Other Topics Concern   Not on file  Social History Narrative   3 children- all daughter- live locally   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, minerals   Married      Social Determinants of Radio broadcast assistant Strain: [place]  (09/06/2021)   Overall Financial Resource Strain (CARDIA)    Difficulty of Paying Living Expenses: Not hard at all  Food Insecurity: No [place] (09/06/2021)   Hunger Vital Sign    Worried About Running Out of Food in the Last Year: Never true    [place] in  the Last Year: Never true  Transportation Needs: No Transportation Needs (09/06/2021)   PRAPARE - Hydrologist (Medical): No    Lack of Transportation (Non-Medical): No  Physical Activity: Inactive (09/06/2021)   Exercise Vital Sign    Days of Exercise per Week: 0 days    Minutes of Exercise per Session: 0 min  Stress: No Stress Concern Present (09/06/2021)   [place]    Feeling of Stress : Not at all  Social Connections: Socially Isolated (09/06/2021)   Social Connection and Isolation Panel [NHANES]    Frequency of Communication with Friends and Family: Once a week    Frequency of Social Gatherings with Friends and Family: Once a week    Attends Religious Services: Never    Marine scientist or Organizations: No    Attends Archivist Meetings: Never    Marital Status: Married  Human resources officer Violence: Not At Risk (09/06/2021)   Humiliation, Afraid, Rape, and Kick questionnaire    Fear of Current or Ex-Partner: No    Emotionally Abused: No    Physically Abused: No    Sexually Abused: No    Outpatient Medications Prior to Visit  Medication Sig Dispense Refill   albuterol (VENTOLIN HFA) 108 (90 Base) MCG/ACT inhaler INHALE 1 - 2 PUFFS INTO THE LUNGS EVERY 6 HOURS AS NEEDED FOR WHEEZING OR SHORTNESS OF BREATH 8.5 g 2   ALPRAZolam (XANAX) 1 MG tablet TAKE 1 TABLET BY MOUTH EVERY NIGHT AT BEDTIME AS NEEDED FOR ANXIETY OR SLEEP 30 tablet 2   atorvastatin (LIPITOR) 20 MG tablet Take 1 tablet (20 mg total) by mouth daily. 90 tablet 0   Calcium-Vitamin D 600-125 MG-UNIT TABS Take 1 tablet by mouth daily.     EPINEPHrine (EPIPEN 2-PAK) 0.3 mg/0.3 mL IJ SOAJ injection Inject 0.3 mg into the muscle as needed for anaphylaxis. 2 each 0   fluticasone-salmeterol (ADVAIR) 250-50 MCG/ACT AEPB Inhale 1 puff into the lungs every 12 (twelve) hours. 60 each 11   Multiple Vitamin  (MULTIVITAMIN) tablet Take 1 tablet by mouth daily.     nicotine (NICOTROL) 10 MG inhaler Inhale 1 Cartridge (1 continuous puffing total) into the lungs as needed for smoking cessation. 150 each 0   oxyCODONE-acetaminophen (PERCOCET) 10-325 MG tablet Take 1 tablet by mouth 4 (four) times daily as needed. 120 tablet  0   TURMERIC PO Take 1 tablet by mouth daily.     vitamin E 200 UNIT capsule Take 200 Units by mouth daily.     vortioxetine HBr (TRINTELLIX) 10 MG TABS tablet Take 1 tablet (10 mg total) by mouth daily. 90 tablet 1   No facility-administered medications prior to visit.    Allergies  Allergen Reactions   Aspirin     Upset stomach   Cymbalta [Duloxetine Hcl] Swelling    Swollen lips    Prozac [Fluoxetine Hcl] Swelling    Lip swelling   Wellbutrin [Bupropion] Swelling    Lip swelling    ROS See HPI    Objective:    Physical Exam Constitutional:      General: She is not in acute distress.    Appearance: Normal appearance. She is not ill-appearing.  HENT:     Head: Normocephalic and atraumatic.     Right Ear: External ear normal.     Left Ear: External ear normal.  Eyes:     Extraocular Movements: Extraocular movements intact.     Pupils: Pupils are equal, round, and reactive to light.  Cardiovascular:     Rate and Rhythm: Normal rate and regular rhythm.     Heart sounds: Normal heart sounds. No murmur heard.    No gallop.  Pulmonary:     Effort: Pulmonary effort is normal. No respiratory distress.     Breath sounds: Normal breath sounds. No wheezing or rales.  Skin:    General: Skin is warm and dry.     Comments: Eczematous rash bilateral AC fossa.   Neurological:     Mental Status: She is alert and oriented to person, place, and time.  Psychiatric:        Mood and Affect: Mood normal.        Behavior: Behavior normal.        Judgment: Judgment normal.     BP 118/73 (BP Location: Right Arm, Patient Position: Sitting, Cuff Size: Small)   Pulse 94    Temp 98 F (36.7 C) (Oral)   Resp 16   Wt 151 lb (68.5 kg)   SpO2 96%   BMI 22.96 kg/m  Wt Readings from Last 3 Encounters:  07/29/22 151 lb (68.5 kg)  07/25/22 153 lb 6.4 oz (69.6 kg)  06/17/22 153 lb (69.4 kg)       Assessment & Plan:   Problem List Items Addressed This Visit       Unprioritized   Flexural eczema    New. Discussed importance of good moisturizer such as cetaphil/cereve or eucerin ointment. Apply betamethasone cream bid to affected areas prn.      Chronic low back pain    She is requesting flexeril for PRN use. She is advised not to take flexeril at the same time that she takes xanax and oxycodone which she is being prescribed by heme/onc.       Relevant Medications   vortioxetine HBr (TRINTELLIX) 5 MG TABS tablet   cyclobenzaprine (FLEXERIL) 5 MG tablet   Anxiety and depression    Felt increased depression on trintellix so she cut it back to '5mg'$ .  She is doing fair on this dose and is also working with a Social worker. We did discuss referral to a psychiatrist but she declines at this time.       Relevant Medications   vortioxetine HBr (TRINTELLIX) 5 MG TABS tablet   Other Visit Diagnoses     Needs flu shot    -  Primary   Relevant Orders   Flu Vaccine QUAD High Dose(Fluad) (Completed)      Meds ordered this encounter  Medications   DISCONTD: vortioxetine HBr (TRINTELLIX) 5 MG TABS tablet    Sig: Take 1 tablet (5 mg total) by mouth daily.    Dispense:  30 tablet    Refill:  2    Order Specific Question:   Supervising Provider    Answer:   Penni Homans A [[id number]]   betamethasone dipropionate 0.05 % cream    Sig: Apply topically 2 (two) times daily.    Dispense:  30 g    Refill:  0    Order Specific Question:   Supervising Provider    Answer:   Penni Homans A [[id number]]   vortioxetine HBr (TRINTELLIX) 5 MG TABS tablet    Sig: Take 1 tablet (5 mg total) by mouth daily.    Dispense:  30 tablet    Refill:  2    Order Specific Question:    Supervising Provider    Answer:   Penni Homans A [[id number]]   cyclobenzaprine (FLEXERIL) 5 MG tablet    Sig: Take 1 tablet (5 mg total) by mouth 3 (three) times daily as needed for muscle spasms.    Dispense:  30 tablet    Refill:  1    Order Specific Question:   Supervising Provider    Answer:   Penni Homans A [[id number]]    I, Nance Pear, NP, personally preformed the services described in this documentation.  All medical record entries made by the scribe were at my direction and in my presence.  I have reviewed the chart and discharge instructions (if applicable) and agree that the record reflects my personal performance and is accurate and complete. 07/29/2022   I,Amber Collins,acting as a scribe for Nance Pear, NP.,have documented all relevant documentation on the behalf of Nance Pear, NP,as directed by  Nance Pear, NP while in the presence of Nance Pear, NP.    Nance Pear, NP

## 2022-07-29 NOTE — Assessment & Plan Note (Signed)
She is requesting flexeril for PRN use. She is advised not to take flexeril at the same time that she takes xanax and oxycodone which she is being prescribed by heme/onc.

## 2022-07-29 NOTE — Telephone Encounter (Signed)
Rx cancelled out at [facility]

## 2022-08-01 ENCOUNTER — Encounter (HOSPITAL_BASED_OUTPATIENT_CLINIC_OR_DEPARTMENT_OTHER): Payer: Self-pay

## 2022-08-01 ENCOUNTER — Ambulatory Visit (HOSPITAL_BASED_OUTPATIENT_CLINIC_OR_DEPARTMENT_OTHER)
Admission: RE | Admit: 2022-08-01 | Discharge: 2022-08-01 | Disposition: A | Discharge status: Home / Self Care | Payer: Medicare Other | Source: Ambulatory Visit | Attending: Family | Admitting: Family

## 2022-08-01 DIAGNOSIS — Z1231 Encounter for screening mammogram for malignant neoplasm of breast: Secondary | ICD-10-CM | POA: Diagnosis not present

## 2022-08-02 ENCOUNTER — Ambulatory Visit (INDEPENDENT_AMBULATORY_CARE_PROVIDER_SITE_OTHER): Payer: Medicare Other | Admitting: Adult Health

## 2022-08-02 ENCOUNTER — Encounter: Payer: Self-pay | Admitting: Adult Health

## 2022-08-02 ENCOUNTER — Ambulatory Visit (INDEPENDENT_AMBULATORY_CARE_PROVIDER_SITE_OTHER): Payer: Medicare Other | Admitting: Pulmonary Disease

## 2022-08-02 ENCOUNTER — Encounter: Payer: Self-pay | Admitting: Family

## 2022-08-02 ENCOUNTER — Other Ambulatory Visit (HOSPITAL_COMMUNITY): Payer: Self-pay

## 2022-08-02 DIAGNOSIS — R911 Solitary pulmonary nodule: Secondary | ICD-10-CM | POA: Diagnosis not present

## 2022-08-02 DIAGNOSIS — J439 Emphysema, unspecified: Secondary | ICD-10-CM

## 2022-08-02 DIAGNOSIS — Z720 Tobacco use: Secondary | ICD-10-CM

## 2022-08-02 DIAGNOSIS — F17210 Nicotine dependence, cigarettes, uncomplicated: Secondary | ICD-10-CM | POA: Diagnosis not present

## 2022-08-02 LAB — PULMONARY FUNCTION TEST
DL/VA % pred: 55 %
DL/VA: 2.25 ml/min/mmHg/L
DLCO cor % pred: 50 %
DLCO cor: 11.39 ml/min/mmHg
DLCO unc % pred: 51 %
DLCO unc: 11.62 ml/min/mmHg
FEF 25-75 Post: 1.06 L/sec
FEF 25-75 Pre: 1.03 L/sec
FEF2575-%Change-Post: 3 %
FEF2575-%Pred-Post: 46 %
FEF2575-%Pred-Pre: 45 %
FEV1-%Change-Post: 0 %
FEV1-%Pred-Post: 73 %
FEV1-%Pred-Pre: 73 %
FEV1-Post: 2.02 L
FEV1-Pre: 2.01 L
FEV1FVC-%Change-Post: 1 %
FEV1FVC-%Pred-Pre: 83 %
FEV6-%Change-Post: 0 %
FEV6-%Pred-Post: 89 %
FEV6-%Pred-Pre: 89 %
FEV6-Post: 3.09 L
FEV6-Pre: 3.09 L
FEV6FVC-%Change-Post: 1 %
FEV6FVC-%Pred-Post: 103 %
FEV6FVC-%Pred-Pre: 101 %
FVC-%Change-Post: -1 %
FVC-%Pred-Post: 86 %
FVC-%Pred-Pre: 87 %
FVC-Post: 3.11 L
FVC-Pre: 3.15 L
Post FEV1/FVC ratio: 65 %
Post FEV6/FVC ratio: 99 %
Pre FEV1/FVC ratio: 64 %
Pre FEV6/FVC Ratio: 98 %
RV % pred: 117 %
RV: 2.74 L
TLC % pred: 102 %
TLC: 5.82 L

## 2022-08-02 MED ORDER — OXYCODONE-ACETAMINOPHEN 10-325 MG PO TABS
1.0000 | ORAL_TABLET | Freq: Four times a day (QID) | ORAL | 0 refills | Status: DC
  Filled 2022-08-02 – 2022-08-06 (×3): qty 120, 30d supply, fill #0

## 2022-08-02 NOTE — Assessment & Plan Note (Signed)
Has been stable on CT scans.  Continue with the lung cancer screening program

## 2022-08-02 NOTE — Progress Notes (Signed)
$'@Patient'U$  ID: Alinda Dooms, female    DOB: [birth year], 66 y.o.   MRN: [id number]  Chief Complaint  Patient presents with   Follow-up    Referring provider: Debbrah Alar, NP  HPI: 66 year old female active smoker followed for moderate COPD with emphysema History significant for cecum adenocarcinoma stage IIIb followed by Dr. Marin Olp status post resection and chemo in 2012 through 2013  TEST/EVENTS :  2019 Spirometry  shows mild airway obstruction with ratio 69 and FEV1 77%   CT chest in 2015 with tiny nodules .  CT chest in 01/2016 with stable right lung nodules , additional nodules improved or resolved. LDCT 08/2019 RADS-2 , tiny nodules 12m on RT   Low-dose CT September 15, 2020 lung RADS 3 probable benign findings, emphysema previous lung nodules are unchanged measuring 5.7 mm.  New nodule right upper lobe 5.4 mm  CT chest/A/P 07/2021 >> stable nodules  08/02/2022 Follow up : COPD with emphysema  Patient presents for a 659-monthollow-up.  Patient says overall she is doing okay.  She gets short of breath with heavy activities.  She remains on Advair twice daily.  PFTs today show mild to moderate airflow obstruction and decreased diffusing capacity with an FEV1 at 73%, 6 ratio 65, FVC 86%.  No significant bronchodilator response, DLCO is 51%. No increased albuterol use. Rarely uses. Remains active, gardening and yard work. Loves to be outside. Is exercising , rides bike outside.  Patient does continue to smoke.  We discussed smoking cessation in detail. Patient participates in the lung cancer screening program.  CT due 09/2022 .  Had Flu shot last week.   Allergies  Allergen Reactions   Aspirin     Upset stomach   Cymbalta [Duloxetine Hcl] Swelling    Swollen lips    Prozac [Fluoxetine Hcl] Swelling    Lip swelling   Wellbutrin [Bupropion] Swelling    Lip swelling    Immunization History  Administered Date(s) Administered   Fluad Quad(high Dose 65+) 07/29/2022    Influenza Split 11/14/2012, 08/16/2021   Influenza, High Dose Seasonal PF 08/24/2020   Influenza, Seasonal, Injecte, Preservative Fre 11/12/2014   Influenza,inj,Quad PF,6+ Mos 07/24/2017, 10/22/2018   Influenza-Unspecified 09/13/2013, 07/31/2015, 08/04/2016   Moderna SARS-COV2 Booster Vaccination 03/30/2021   Moderna Sars-Covid-2 Vaccination 12/28/2019, 01/18/2020, 10/05/2020   PNEUMOCOCCAL CONJUGATE-20 06/17/2022   Pneumococcal Polysaccharide-23 03/12/2013, 05/20/2020   Tdap 03/12/2013   Zoster Recombinat (Shingrix) 10/22/2018, 05/07/2019    Past Medical History:  Diagnosis Date   Anemia    Anxiety    Arthritis    Borderline diabetes    Cataract    bilat removed   [place]   [place]   [place]   Depression    GERD (gastroesophageal reflux disease)    Hyperlipidemia 05/27/2021   Neuromuscular disorder (HCC)    neuropathy from chemo   Peptic ulcer    Pneumonia    Shortness of breath dyspnea    with exertion    Tobacco History: Social History   Tobacco Use  Smoking Status Every Day   Packs/day: 1.00   Years: 45.00   Total pack years: 45.00   Types: Cigarettes   Start date: 04/23/1986  Smokeless Tobacco Never  Tobacco Comments   Smokes 7 packs of cigarettes a week. 08/02/2022 [place]   Ready to quit: Not Answered Counseling given: Not Answered Tobacco comments: Smokes 7 packs of cigarettes a week. 08/02/2022 Tay   Outpatient Medications Prior to  Visit  Medication Sig Dispense Refill   albuterol (VENTOLIN HFA) 108 (90 Base) MCG/ACT inhaler INHALE 1 - 2 PUFFS INTO THE LUNGS EVERY 6 HOURS AS NEEDED FOR WHEEZING OR SHORTNESS OF BREATH 8.5 g 2   fluticasone-salmeterol (ADVAIR) 250-50 MCG/ACT AEPB Inhale 1 puff into the lungs every 12 (twelve) hours. 60 each 11   ALPRAZolam (XANAX) 1 MG tablet TAKE 1 TABLET BY MOUTH EVERY NIGHT AT BEDTIME AS NEEDED FOR ANXIETY OR SLEEP 30 tablet 2   atorvastatin (LIPITOR) 20 MG tablet Take 1  tablet (20 mg total) by mouth daily. 90 tablet 0   betamethasone dipropionate 0.05 % cream Apply topically 2 (two) times daily. 30 g 0   Calcium-Vitamin D 600-125 MG-UNIT TABS Take 1 tablet by mouth daily.     cyclobenzaprine (FLEXERIL) 5 MG tablet Take 1 tablet (5 mg total) by mouth 3 (three) times daily as needed for muscle spasms. 30 tablet 1   EPINEPHrine (EPIPEN 2-PAK) 0.3 mg/0.3 mL IJ SOAJ injection Inject 0.3 mg into the muscle as needed for anaphylaxis. 2 each 0   Multiple Vitamin (MULTIVITAMIN) tablet Take 1 tablet by mouth daily.     nicotine (NICOTROL) 10 MG inhaler Inhale 1 Cartridge (1 continuous puffing total) into the lungs as needed for smoking cessation. 150 each 0   oxyCODONE-acetaminophen (PERCOCET) 10-325 MG tablet Take 1 tablet by mouth 4 (four) times daily as needed. 120 tablet 0   oxyCODONE-acetaminophen (PERCOCET) 10-325 MG tablet TAKE 1 TABLET BY MOUTH (4) TIMES DAILY AS NEEDED. 120 tablet 0   TURMERIC PO Take 1 tablet by mouth daily.     vitamin E 200 UNIT capsule Take 200 Units by mouth daily.     vortioxetine HBr (TRINTELLIX) 5 MG TABS tablet Take 1 tablet (5 mg total) by mouth daily. 30 tablet 2   No facility-administered medications prior to visit.     Review of Systems:   Constitutional:   No  weight loss, night sweats,  Fevers, chills, fatigue, or  lassitude.  HEENT:   No headaches,  Difficulty swallowing,  Tooth/dental problems, or  Sore throat,                No sneezing, itching, ear ache, nasal congestion, post nasal drip,   CV:  No chest pain,  Orthopnea, PND, swelling in lower extremities, anasarca, dizziness, palpitations, syncope.   GI  No heartburn, indigestion, abdominal pain, nausea, vomiting, diarrhea, change in bowel habits, loss of appetite, bloody stools.   Resp: , .  No wheezing.  No chest wall deformity  Skin: no rash or lesions.  GU: no dysuria, change in color of urine, no urgency or frequency.  No flank pain, no hematuria   MS:  No  joint pain or swelling.  No decreased range of motion.  No back pain.    Physical Exam  BP 130/78 (BP Location: Left Arm)   Pulse 93   Ht '5\' 8"'$  (1.727 m)   Wt 153 lb (69.4 kg)   SpO2 95%   BMI 23.26 kg/m   GEN: A/Ox3; pleasant , NAD, well nourished    HEENT:  /AT,   NOSE-clear, THROAT-clear, no lesions, no postnasal drip or exudate noted.   NECK:  Supple w/ fair ROM; no JVD; normal carotid impulses w/o bruits; no thyromegaly or nodules palpated; no lymphadenopathy.    RESP  Clear  P & A; w/o, wheezes/ rales/ or rhonchi. no accessory muscle use, no dullness to percussion  CARD:  RRR, no  m/r/g, no peripheral edema, pulses intact, no cyanosis or clubbing.  GI:   Soft & nt; nml bowel sounds; no organomegaly or masses detected.   Musco: Warm bil, no deformities or joint swelling noted.   Neuro: alert, no focal deficits noted.    Skin: Warm, no lesions or rashes    Lab Results:  CBC    BNP No results found for: "BNP"  ProBNP No results found for: "PROBNP"  Imaging:       Latest Ref Rng & Units 08/02/2022    1:37 PM 12/27/2017   10:52 AM  PFT Results  FVC-Pre L 3.15  P 3.21  P  FVC-Predicted Pre % 87  P 85  P  FVC-Post L 3.11  P   FVC-Predicted Post % 86  P   Pre FEV1/FVC % % 64  P 69  P  Post FEV1/FCV % % 65  P   FEV1-Pre L 2.01  P 2.23  P  FEV1-Predicted Pre % 73  P 77  P  FEV1-Post L 2.02  P   DLCO uncorrected ml/min/mmHg 11.62  P   DLCO UNC% % 51  P   DLCO corrected ml/min/mmHg 11.39  P   DLCO COR %Predicted % 50  P   DLVA Predicted % 55  P   TLC L 5.82  P   TLC % Predicted % 102  P   RV % Predicted % 117  P     P Preliminary result    No results found for: "NITRICOXIDE"      Assessment & Plan:   COPD with emphysema, Gold B Moderate COPD with emphysema.  PFTs today show mild to moderate airflow obstruction with a decreased diffusing capacity.  This is similar to her previous spirometry's in the past.  She is encouraged on smoking  cessation.  The 1 800 quit now program was discussed with patient.  She participates in the lung cancer screening program. We will continue on Advair twice daily.  Plan  Patient Instructions  Continue on Advair 1 puff twice daily,  rinse after use Activity as tolerated Albuterol inhaler as needed Claritin '10mg'$  daily As needed  Drainage  Saline nasal rinses As needed   Work on quitting smoking .  We will contact the lung cancer screening program to check on your upcoming CT scan  Follow-up in 1 year with Dr. Elsworth Soho and as needed     Tobacco abuse Smoking cessation discussed in detail  Lung nodule seen on imaging study Has been stable on CT scans.  Continue with the lung cancer screening program     Rexene Edison, NP 08/02/2022

## 2022-08-02 NOTE — Assessment & Plan Note (Signed)
Smoking cessation discussed in detail 

## 2022-08-02 NOTE — Patient Instructions (Signed)
Full PFT performed today. °

## 2022-08-02 NOTE — Progress Notes (Signed)
Full PFT performed today. °

## 2022-08-02 NOTE — Assessment & Plan Note (Signed)
Moderate COPD with emphysema.  PFTs today show mild to moderate airflow obstruction with a decreased diffusing capacity.  This is similar to her previous spirometry's in the past.  She is encouraged on smoking cessation.  The 1 800 quit now program was discussed with patient.  She participates in the lung cancer screening program. We will continue on Advair twice daily.  Plan  Patient Instructions  Continue on Advair 1 puff twice daily,  rinse after use Activity as tolerated Albuterol inhaler as needed Claritin '10mg'$  daily As needed  Drainage  Saline nasal rinses As needed   Work on quitting smoking .  We will contact the lung cancer screening program to check on your upcoming CT scan  Follow-up in 1 year with Dr. Elsworth Soho and as needed

## 2022-08-02 NOTE — Patient Instructions (Addendum)
Continue on Advair 1 puff twice daily,  rinse after use Activity as tolerated Albuterol inhaler as needed Claritin '10mg'$  daily As needed  Drainage  Saline nasal rinses As needed   Work on quitting smoking .  We will contact the lung cancer screening program to check on your upcoming CT scan  Follow-up in 1 year with Dr. Elsworth Soho and as needed

## 2022-08-03 ENCOUNTER — Other Ambulatory Visit (HOSPITAL_COMMUNITY): Payer: Self-pay

## 2022-08-05 ENCOUNTER — Other Ambulatory Visit (HOSPITAL_COMMUNITY): Payer: Self-pay

## 2022-08-06 ENCOUNTER — Other Ambulatory Visit (HOSPITAL_COMMUNITY): Payer: Self-pay

## 2022-08-18 ENCOUNTER — Other Ambulatory Visit (HOSPITAL_BASED_OUTPATIENT_CLINIC_OR_DEPARTMENT_OTHER): Payer: Self-pay

## 2022-08-29 ENCOUNTER — Other Ambulatory Visit (HOSPITAL_BASED_OUTPATIENT_CLINIC_OR_DEPARTMENT_OTHER): Payer: Self-pay

## 2022-08-29 ENCOUNTER — Other Ambulatory Visit (HOSPITAL_COMMUNITY): Payer: Self-pay

## 2022-08-29 MED ORDER — OXYCODONE-ACETAMINOPHEN 10-325 MG PO TABS
1.0000 | ORAL_TABLET | Freq: Four times a day (QID) | ORAL | 0 refills | Status: DC | PRN
  Filled 2022-09-05: qty 120, 30d supply, fill #0

## 2022-09-05 ENCOUNTER — Other Ambulatory Visit (HOSPITAL_COMMUNITY): Payer: Self-pay

## 2022-09-05 ENCOUNTER — Other Ambulatory Visit (HOSPITAL_BASED_OUTPATIENT_CLINIC_OR_DEPARTMENT_OTHER): Payer: Self-pay

## 2022-09-05 ENCOUNTER — Telehealth: Payer: Self-pay | Admitting: Family

## 2022-09-05 MED ORDER — AREXVY 120 MCG/0.5ML IM SUSR
0.5000 mL | Freq: Once | INTRAMUSCULAR | 0 refills | Status: AC
  Filled 2022-09-05: qty 0.5, 1d supply, fill #0
  Filled 2022-09-05: qty 1, 1d supply, fill #0

## 2022-09-05 MED ORDER — COMIRNATY 30 MCG/0.3ML IM SUSY
PREFILLED_SYRINGE | INTRAMUSCULAR | 0 refills | Status: DC
  Filled 2022-09-05: qty 0.3, 1d supply, fill #0

## 2022-09-05 NOTE — Telephone Encounter (Signed)
Rx sent for the RSV vaccine.

## 2022-09-05 NOTE — Telephone Encounter (Signed)
Pt stated pharmacy downstairs was needing approval from pcp to get the rsv shot.

## 2022-09-05 NOTE — Telephone Encounter (Signed)
Left detailed message about the note below.

## 2022-09-08 ENCOUNTER — Ambulatory Visit (INDEPENDENT_AMBULATORY_CARE_PROVIDER_SITE_OTHER): Payer: Medicare Other | Admitting: *Deleted

## 2022-09-08 ENCOUNTER — Encounter: Payer: Self-pay | Admitting: Family

## 2022-09-08 DIAGNOSIS — Z0000 Encounter for general adult medical examination without abnormal findings: Secondary | ICD-10-CM | POA: Diagnosis not present

## 2022-09-08 NOTE — Patient Instructions (Signed)
Teresa Morgan , Thank you for taking time to come for your Medicare Wellness Visit. I appreciate your ongoing commitment to your health goals. Please review the following plan we discussed and let me know if I can assist you in the future.   These are the goals we discussed:  Goals      Quit Smoking        This is a list of the screening recommended for you and due dates:  Health Maintenance  Topic Date Due   Hepatitis C Screening: USPSTF Recommendation to screen - Ages 52-79 yo.  Never done   COVID-19 Vaccine (4 - Moderna risk series) 05/25/2021   Screening for Lung Cancer  08/03/2022   Tetanus Vaccine  03/13/2023   Colon Cancer Screening  04/24/2023   Medicare Annual Wellness Visit  10/09/2023   Mammogram  08/01/2024   Pneumonia Vaccine  Completed   Flu Shot  Completed   DEXA scan (bone density measurement)  Completed   Zoster (Shingles) Vaccine  Completed   HPV Vaccine  Aged Out     Next appointment: Follow up in one year for your annual wellness visit    Preventive Care 11 Years and Older, Female Preventive care refers to lifestyle choices and visits with your health care provider that can promote health and wellness. What does preventive care include? A yearly physical exam. This is also called an annual well check. Dental exams once or twice a year. Routine eye exams. Ask your health care provider how often you should have your eyes checked. Personal lifestyle choices, including: Daily care of your teeth and gums. Regular physical activity. Eating a healthy diet. Avoiding tobacco and drug use. Limiting alcohol use. Practicing safe sex. Taking low-dose aspirin every day. Taking vitamin and mineral supplements as recommended by your health care provider. What happens during an annual well check? The services and screenings done by your health care provider during your annual well check will depend on your age, overall health, lifestyle risk factors, and family history  of disease. Counseling  Your health care provider may ask you questions about your: Alcohol use. Tobacco use. Drug use. Emotional well-being. Home and relationship well-being. Sexual activity. Eating habits. History of falls. Memory and ability to understand (cognition). Work and work Statistician. Reproductive health. Screening  You may have the following tests or measurements: Height, weight, and BMI. Blood pressure. Lipid and cholesterol levels. These may be checked every 5 years, or more frequently if you are over 60 years old. Skin check. Lung cancer screening. You may have this screening every year starting at age 7 if you have a 30-pack-year history of smoking and currently smoke or have quit within the past 15 years. Fecal occult blood test (FOBT) of the stool. You may have this test every year starting at age 11. Flexible sigmoidoscopy or colonoscopy. You may have a sigmoidoscopy every 5 years or a colonoscopy every 10 years starting at age 76. Hepatitis C blood test. Hepatitis B blood test. Sexually transmitted disease (STD) testing. Diabetes screening. This is done by checking your blood sugar (glucose) after you have not eaten for a while (fasting). You may have this done every 1-3 years. Bone density scan. This is done to screen for osteoporosis. You may have this done starting at age 55. Mammogram. This may be done every 1-2 years. Talk to your health care provider about how often you should have regular mammograms. Talk with your health care provider about your test results, treatment  options, and if necessary, the need for more tests. Vaccines  Your health care provider may recommend certain vaccines, such as: Influenza vaccine. This is recommended every year. Tetanus, diphtheria, and acellular pertussis (Tdap, Td) vaccine. You may need a Td booster every 10 years. Zoster vaccine. You may need this after age 80. Pneumococcal 13-valent conjugate (PCV13) vaccine. One  dose is recommended after age 12. Pneumococcal polysaccharide (PPSV23) vaccine. One dose is recommended after age 28. Talk to your health care provider about which screenings and vaccines you need and how often you need them. This information is not intended to replace advice given to you by your health care provider. Make sure you discuss any questions you have with your health care provider. Document Released: 11/27/2015 Document Revised: 07/20/2016 Document Reviewed: 09/01/2015 Elsevier Interactive Patient Education  2017 [place] Prevention in the Home Falls can cause injuries. They can happen to people of all ages. There are many things you can do to make your home safe and to help prevent falls. What can I do on the outside of my home? Regularly fix the edges of walkways and driveways and fix any cracks. Remove anything that might make you trip as you walk through a door, such as a raised step or threshold. Trim any bushes or trees on the path to your home. Use bright outdoor lighting. Clear any walking paths of anything that might make someone trip, such as rocks or tools. Regularly check to see if handrails are loose or broken. Make sure that both sides of any steps have handrails. Any raised decks and porches should have guardrails on the edges. Have any leaves, snow, or ice cleared regularly. Use sand or salt on walking paths during winter. Clean up any spills in your garage right away. This includes oil or grease spills. What can I do in the bathroom? Use night lights. Install grab bars by the toilet and in the tub and shower. Do not use towel bars as grab bars. Use non-skid mats or decals in the tub or shower. If you need to sit down in the shower, use a plastic, non-slip stool. Keep the floor dry. Clean up any water that spills on the floor as soon as it happens. Remove soap buildup in the tub or shower regularly. Attach bath mats securely with double-sided  non-slip rug tape. Do not have throw rugs and other things on the floor that can make you trip. What can I do in the bedroom? Use night lights. Make sure that you have a light by your bed that is easy to reach. Do not use any sheets or blankets that are too big for your bed. They should not hang down onto the floor. Have a firm chair that has side arms. You can use this for support while you get dressed. Do not have throw rugs and other things on the floor that can make you trip. What can I do in the kitchen? Clean up any spills right away. Avoid walking on wet floors. Keep items that you use a lot in easy-to-reach places. If you need to reach something above you, use a strong step stool that has a grab bar. Keep electrical cords out of the way. Do not use floor polish or wax that makes floors slippery. If you must use wax, use non-skid floor wax. Do not have throw rugs and other things on the floor that can make you trip. What can I do with my stairs? Do not  leave any items on the stairs. Make sure that there are handrails on both sides of the stairs and use them. Fix handrails that are broken or loose. Make sure that handrails are as long as the stairways. Check any carpeting to make sure that it is firmly attached to the stairs. Fix any carpet that is loose or worn. Avoid having throw rugs at the top or bottom of the stairs. If you do have throw rugs, attach them to the floor with carpet tape. Make sure that you have a light switch at the top of the stairs and the bottom of the stairs. If you do not have them, ask someone to add them for you. What else can I do to help prevent falls? Wear shoes that: Do not have high heels. Have rubber bottoms. Are comfortable and fit you well. Are closed at the toe. Do not wear sandals. If you use a stepladder: Make sure that it is fully opened. Do not climb a closed stepladder. Make sure that both sides of the stepladder are locked into place. Ask  someone to hold it for you, if possible. Clearly mark and make sure that you can see: Any grab bars or handrails. First and last steps. Where the edge of each step is. Use tools that help you move around (mobility aids) if they are needed. These include: Canes. Walkers. Scooters. Crutches. Turn on the lights when you go into a dark area. Replace any light bulbs as soon as they burn out. Set up your furniture so you have a clear path. Avoid moving your furniture around. If any of your floors are uneven, fix them. If there are any pets around you, be aware of where they are. Review your medicines with your doctor. Some medicines can make you feel dizzy. This can increase your chance of falling. Ask your doctor what other things that you can do to help prevent falls. This information is not intended to replace advice given to you by your health care provider. Make sure you discuss any questions you have with your health care provider. Document Released: 08/27/2009 Document Revised: 04/07/2016 Document Reviewed: 12/05/2014 Elsevier Interactive Patient Education  2017 [employer].

## 2022-09-08 NOTE — Progress Notes (Signed)
Subjective:   Teresa Morgan is a 66 y.o. female who presents for Medicare Annual (Subsequent) preventive examination.  I connected with  Alinda Dooms on 09/08/22 by a audio enabled telemedicine application and verified that I am speaking with the correct person using two identifiers.  Patient Location: Home  Provider Location: Office/Clinic  I discussed the limitations of evaluation and management by telemedicine. The patient expressed understanding and agreed to proceed.   Review of Systems    Defer to PCP Cardiac Risk Factors include: dyslipidemia;advanced age (>42mn, >>39women);smoking/ tobacco exposure     Objective:    There were no vitals filed for this visit. There is no height or weight on file to calculate BMI.     09/08/2022   10:22 AM 07/25/2022   11:51 AM 09/06/2021   10:28 AM 09/01/2021   10:38 AM 07/23/2021   12:09 PM 01/23/2019   11:44 AM 07/25/2018   12:09 PM  Advanced Directives  Does Patient Have a Medical Advance Directive? No No No No No No No  Would patient like information on creating a medical advance directive? No - Patient declined No - Patient declined Yes (MAU/Ambulatory/Procedural Areas - Information given) No - Patient declined No - Patient declined No - Patient declined     Current Medications (verified) Outpatient Encounter Medications as of 09/08/2022  Medication Sig   albuterol (VENTOLIN HFA) 108 (90 Base) MCG/ACT inhaler INHALE 1 - 2 PUFFS INTO THE LUNGS EVERY 6 HOURS AS NEEDED FOR WHEEZING OR SHORTNESS OF BREATH   ALPRAZolam (XANAX) 1 MG tablet TAKE 1 TABLET BY MOUTH EVERY NIGHT AT BEDTIME AS NEEDED FOR ANXIETY OR SLEEP   atorvastatin (LIPITOR) 20 MG tablet Take 1 tablet (20 mg total) by mouth daily.   betamethasone dipropionate 0.05 % cream Apply topically 2 (two) times daily.   Calcium-Vitamin D 600-125 MG-UNIT TABS Take 1 tablet by mouth daily.   COVID-19 mRNA vaccine 2023-2024 (COMIRNATY) syringe Inject into the muscle.    cyclobenzaprine (FLEXERIL) 5 MG tablet Take 1 tablet (5 mg total) by mouth 3 (three) times daily as needed for muscle spasms.   EPINEPHrine (EPIPEN 2-PAK) 0.3 mg/0.3 mL IJ SOAJ injection Inject 0.3 mg into the muscle as needed for anaphylaxis.   fluticasone-salmeterol (ADVAIR) 250-50 MCG/ACT AEPB Inhale 1 puff into the lungs every 12 (twelve) hours.   Multiple Vitamin (MULTIVITAMIN) tablet Take 1 tablet by mouth daily.   nicotine (NICOTROL) 10 MG inhaler Inhale 1 Cartridge (1 continuous puffing total) into the lungs as needed for smoking cessation.   oxyCODONE-acetaminophen (PERCOCET) 10-325 MG tablet Take 1 tablet by mouth 4 (four) times daily as needed.   oxyCODONE-acetaminophen (PERCOCET) 10-325 MG tablet TAKE 1 TABLET BY MOUTH (4) TIMES DAILY AS NEEDED.   oxyCODONE-acetaminophen (PERCOCET) 10-325 MG tablet Take 1 tablet by mouth 4 (four) times daily as needed.   TURMERIC PO Take 1 tablet by mouth daily.   vitamin E 200 UNIT capsule Take 200 Units by mouth daily.   vortioxetine HBr (TRINTELLIX) 5 MG TABS tablet Take 1 tablet (5 mg total) by mouth daily.   No facility-administered encounter medications on file as of 09/08/2022.    Allergies (verified) Aspirin, Cymbalta [duloxetine hcl], Prozac [fluoxetine hcl], and Wellbutrin [bupropion]   History: Past Medical History:  Diagnosis Date   Anemia    Anxiety    Arthritis    Borderline diabetes    Cataract    bilat removed   Colon cancer ([facility] 2013   cecal  cancer   COPD with emphysema ([place]) 06/18/2012   Depression    GERD (gastroesophageal reflux disease)    Hyperlipidemia 05/27/2021   Neuromuscular disorder (HCC)    neuropathy from chemo   Peptic ulcer    Pneumonia    Shortness of breath dyspnea    with exertion   Past Surgical History:  Procedure Laterality Date   APPENDECTOMY  07/07/11   BREAST EXCISIONAL BIOPSY Right 03/15/2018   COLON RESECTION  2012   for colon cancer   COLONOSCOPY     EYE SURGERY Bilateral     cataract surgery with lens implant   heel spurs Right    hemrrhoids     PLANTAR FASCIA SURGERY Right 2009   PORT-A-CATH REMOVAL Left 04/19/2016   Procedure: REMOVAL PORT-A-CATH;  Surgeon: [name], MD;  Location: [facility];  Service: General;  Laterality: Left;   portacath     RADIOACTIVE SEED GUIDED EXCISIONAL BREAST BIOPSY Right 03/15/2018   Procedure: RADIOACTIVE SEED GUIDED EXCISIONAL BREAST BIOPSY;  Surgeon: [name], MD;  Location: [facility];  Service: General;  Laterality: Right;   TUBAL LIGATION  07/1980   Family History  Problem Relation Age of Onset   Diabetes Mother    Colon cancer Cousin    Colon cancer Cousin    Thalassemia Daughter    Colon polyps Neg Hx    Rectal cancer Neg Hx    Stomach cancer Neg Hx    Social History   Socioeconomic History   Marital status: Married    Spouse name: Not on file   Number of children: 3   Years of education: Not on file   Highest education level: Not on file  Occupational History   Occupation: Armed forces operational officer: [employer]  Tobacco Use   Smoking status: Every Day    Packs/day: 1.00    Years: 45.00    Total pack years: 45.00    Types: Cigarettes    Start date: 04/23/1986   Smokeless tobacco: Never   Tobacco comments:    Smokes 7 packs of cigarettes a week. 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, minerals   Married      Social Determinants of Radio broadcast assistant Strain: [place]  (09/06/2021)   Overall Financial Resource Strain (CARDIA)    Difficulty of Paying Living Expenses: Not hard at all  Food Insecurity: No [place] (09/06/2021)    Hunger Vital Sign    Worried About Running Out of Food in the Last Year: Never true    [place] in the Last Year: Never true  Transportation Needs: No Transportation Needs (09/06/2021)   PRAPARE - Hydrologist (Medical): No    Lack of Transportation (Non-Medical): No  Physical Activity: Inactive (09/06/2021)   Exercise Vital Sign    Days of Exercise per Week: 0 days    Minutes of Exercise per Session: 0 min  Stress: No Stress Concern Present (09/06/2021)   [place]  Questionnaire    Feeling of Stress : Not at all  Social Connections: Socially Isolated (09/06/2021)   Social Connection and Isolation Panel [NHANES]    Frequency of Communication with Friends and Family: Once a week    Frequency of Social Gatherings with Friends and Family: Once a week    Attends Religious Services: Never    Marine scientist or Organizations: No    Attends Music therapist: Never    Marital Status: Married    Tobacco Counseling Ready to quit: Not Answered Counseling given: Not Answered Tobacco comments: Smokes 7 packs of cigarettes a week. 08/02/2022 Tay   Clinical Intake:  Pre-visit preparation completed: Yes  Pain : No/denies pain  How often do you need to have someone help you when you read instructions, pamphlets, or other written materials from your doctor or pharmacy?: 1 - Never  Diabetic? No  Activities of Daily Living    09/08/2022   10:25 AM  In your present state of health, do you have any difficulty performing the following activities:  Hearing? 0  Vision? 0  Difficulty concentrating or making decisions? 0  Walking or climbing stairs? 0  Dressing or bathing? 0  Doing errands, shopping? 0  Preparing Food and eating ? N  Using the Toilet? N  In the past six months, have you accidently leaked urine? Y  Comment some stress incontinence  Do you have problems with loss of  bowel control? N  Managing your Medications? N  Managing your Finances? N  Housekeeping or managing your Housekeeping? N    Patient Care Team: Debbrah Alar, NP as PCP - General (Internal Medicine) Elsie Stain, MD as Attending Physician (Pulmonary Disease) Volanda Napoleon, MD as Consulting Physician (Oncology)  Indicate any recent Medical Services you may have received from other than [facility] providers in the past year (date may be approximate).     Assessment:   This is a routine wellness examination for [facility].  Hearing/Vision screen No results found.  Dietary issues and exercise activities discussed: Current Exercise Habits: Home exercise routine, Type of exercise: walking (bike riding), Time (Minutes): 15, Frequency (Times/Week): 6, Weekly Exercise (Minutes/Week): 90, Intensity: Mild, Exercise limited by: None identified   Goals Addressed   None    Depression Screen    09/08/2022   10:23 AM 06/17/2022   10:30 AM 09/06/2021   10:31 AM 11/24/2020    1:49 PM 09/25/2020    1:07 PM 05/20/2020    2:12 PM 10/22/2018   12:05 PM  PHQ 2/9 Scores  PHQ - 2 Score 4 0 0 3 3 0 0  PHQ- 9 Score '6   6 6 3 3    '$ Fall Risk    09/08/2022   10:22 AM 06/17/2022   10:30 AM 09/06/2021   10:30 AM 09/25/2020    1:07 PM 05/20/2020    1:23 PM  [place] in the past year? 0 0 0 1 0  Number falls in past yr: 0 0 0 0 0  Injury with Fall? 0 0 0 0 0  Risk for fall due to : No Fall Risks      Follow up Falls evaluation completed  Falls prevention discussed  Falls evaluation completed    [place]:  Any stairs in or around the home? Yes  If so, are there any without handrails? No  Home free of loose throw rugs in walkways, pet beds, electrical  cords, etc? Yes  Adequate lighting in your home to reduce risk of falls? Yes   ASSISTIVE DEVICES UTILIZED TO PREVENT FALLS:  Life alert? No  Use of a cane, walker or w/c? No  Grab bars in the bathroom?  Yes  Shower chair or bench in shower? No  Elevated toilet seat or a handicapped toilet? No   TIMED UP AND GO:  Was the test performed?  No, audio visit .    Cognitive Function:        09/08/2022   10:29 AM  6CIT Screen  What Year? 0 points  What month? 0 points  What time? 0 points  Count back from 20 0 points  Months in reverse 0 points  Repeat phrase 2 points  Total Score 2 points    Immunizations Immunization History  Administered Date(s) Administered   COVID-19, mRNA, vaccine(Comirnaty)12 years and older 09/05/2022   Fluad Quad(high Dose 65+) 07/29/2022   Influenza Split 11/14/2012, 08/16/2021   Influenza, High Dose Seasonal PF 08/24/2020   Influenza, Seasonal, Injecte, Preservative Fre 11/12/2014   Influenza,inj,Quad PF,6+ Mos 07/24/2017, 10/22/2018   Influenza-Unspecified 09/13/2013, 07/31/2015, 08/04/2016   Moderna SARS-COV2 Booster Vaccination 03/30/2021   Moderna Sars-Covid-2 Vaccination 12/28/2019, 01/18/2020, 10/05/2020   PNEUMOCOCCAL CONJUGATE-20 06/17/2022   Pneumococcal Polysaccharide-23 03/12/2013, 05/20/2020   Respiratory Syncytial Virus Vaccine,Recomb Aduvanted(Arexvy) 09/05/2022   Tdap 03/12/2013   Zoster Recombinat (Shingrix) 10/22/2018, 05/07/2019    TDAP status: Up to date  Flu Vaccine status: Up to date  Pneumococcal vaccine status: Up to date  Covid-19 vaccine status: Information provided on how to obtain vaccines.   Qualifies for Shingles Vaccine? Yes   Zostavax completed No   Shingrix Completed?: Yes  Screening Tests Health Maintenance  Topic Date Due   Hepatitis C Screening  Never done   COVID-19 Vaccine (4 - Moderna risk series) 05/25/2021   Lung Cancer Screening  08/03/2022   Medicare Annual Wellness (AWV)  10/18/2022   TETANUS/TDAP  03/13/2023   COLONOSCOPY (Pts 45-61yr Insurance coverage will need to be confirmed)  04/24/2023   MAMMOGRAM  08/01/2024   Pneumonia Vaccine 66 Years old  Completed   INFLUENZA VACCINE   Completed   DEXA SCAN  Completed   Zoster Vaccines- Shingrix  Completed   HPV VACCINES  Aged Out    Health Maintenance  Health Maintenance Due  Topic Date Due   Hepatitis C Screening  Never done   COVID-19 Vaccine (4 - Moderna risk series) 05/25/2021   Lung Cancer Screening  08/03/2022   Medicare Annual Wellness (AWV)  10/18/2022    Colorectal cancer screening: Type of screening: Colonoscopy. Completed 04/23/18. Repeat every 5 years  Mammogram status: Completed 08/01/22. Repeat every year  Bone Density status: Completed 05/25/20. Results reflect: Bone density results: NORMAL. Repeat every 2 years.  Lung Cancer Screening: (Low Dose CT Chest recommended if Age 67-80years, 30 pack-year currently smoking OR have quit w/in 15years.) does qualify.   Lung Cancer Screening Referral: already placed  Additional Screening:  Hepatitis C Screening: does qualify; Completed N/a  Vision Screening: Recommended annual ophthalmology exams for early detection of glaucoma and other disorders of the eye. Is the patient up to date with their annual eye exam?  No  Who is the provider or what is the name of the office in which the patient attends annual eye exams? [facility] If pt is not established with a provider, would they like to be referred to a provider to establish care? No .   Dental  Screening: Recommended annual dental exams for proper oral hygiene  Community Resource Referral / Chronic Care Management: CRR required this visit?  No   CCM required this visit?  No      Plan:     I have personally reviewed and noted the following in the patient's chart:   Medical and social history Use of alcohol, tobacco or illicit drugs  Current medications and supplements including opioid prescriptions. Patient is currently taking opioid prescriptions. Information provided to patient regarding non-opioid alternatives. Patient advised to discuss non-opioid treatment plan with their  provider. Functional ability and status Nutritional status Physical activity Advanced directives List of other physicians Hospitalizations, surgeries, and ER visits in previous 12 months Vitals Screenings to include cognitive, depression, and falls Referrals and appointments  In addition, I have reviewed and discussed with patient certain preventive protocols, quality metrics, and best practice recommendations. A written personalized care plan for preventive services as well as general preventive health recommendations were provided to patient.   Due to this being a telephonic visit, the after visit summary with patients personalized plan was offered to patient via mail or my-chart. Patient would like to access on my-chart.  Beatris Ship, [place]   09/08/2022   Nurse Notes: None

## 2022-09-09 DIAGNOSIS — N950 Postmenopausal bleeding: Secondary | ICD-10-CM

## 2022-09-09 NOTE — Telephone Encounter (Signed)
Patient confirmed having a blood clot and some bleeding from vagina yesterday. Referral was placed.

## 2022-09-15 ENCOUNTER — Other Ambulatory Visit: Payer: Self-pay | Admitting: Hematology & Oncology

## 2022-09-15 DIAGNOSIS — J430 Unilateral pulmonary emphysema [MacLeod's syndrome]: Secondary | ICD-10-CM

## 2022-09-15 DIAGNOSIS — C180 Malignant neoplasm of cecum: Secondary | ICD-10-CM

## 2022-09-15 DIAGNOSIS — G629 Polyneuropathy, unspecified: Secondary | ICD-10-CM

## 2022-09-26 ENCOUNTER — Other Ambulatory Visit: Payer: Self-pay | Admitting: Family

## 2022-09-26 MED ORDER — CYCLOBENZAPRINE HCL 5 MG PO TABS: 5.0000 mg | ORAL_TABLET | Freq: Three times a day (TID) | ORAL | 0 refills | Status: DC | PRN

## 2022-09-28 ENCOUNTER — Other Ambulatory Visit (HOSPITAL_COMMUNITY): Payer: Self-pay

## 2022-10-04 ENCOUNTER — Other Ambulatory Visit (HOSPITAL_BASED_OUTPATIENT_CLINIC_OR_DEPARTMENT_OTHER): Payer: Self-pay

## 2022-10-04 ENCOUNTER — Ambulatory Visit (INDEPENDENT_AMBULATORY_CARE_PROVIDER_SITE_OTHER): Payer: Medicare Other | Admitting: Family

## 2022-10-04 ENCOUNTER — Other Ambulatory Visit (HOSPITAL_COMMUNITY): Payer: Self-pay

## 2022-10-04 VITALS — BP 137/72 | HR 98 | Temp 97.5°F | Resp 16 | Wt 157.0 lb

## 2022-10-04 DIAGNOSIS — N950 Postmenopausal bleeding: Secondary | ICD-10-CM | POA: Diagnosis not present

## 2022-10-04 MED ORDER — OXYCODONE-ACETAMINOPHEN 10-325 MG PO TABS
1.0000 | ORAL_TABLET | Freq: Four times a day (QID) | ORAL | 0 refills | Status: DC | PRN
  Filled 2022-10-04 – 2022-10-05 (×2): qty 120, 30d supply, fill #0

## 2022-10-04 NOTE — Progress Notes (Signed)
Subjective:   By signing my name below, I, [name], attest that this documentation has been prepared under the direction and in the presence of [place], NP 10/04/2022   Patient ID: Teresa Morgan, female    DOB: [birth year], 66 y.o.   MRN: [id number]  Chief Complaint  Patient presents with   Vaginal Bleeding    Complains of having vaginal bleeding twice in the last 3 weks    Vaginal Bleeding Associated symptoms include abdominal pain.  Patient is in today for an office visit   Vaginal Bleeding: She reports that her vaginal bleeding is reoccurring. She also has associated symptoms of cramping. She was previously referred to a gynecologist but due to conflict in scheduling, could not be scheduled. She states that symptoms first appeared two weeks ago and she needed to use a pad for her symptoms. Symptoms then reappeared about a week ago and she did not have to use a pad at the time. She tends to notice after intercourse.  Health Maintenance Due  Topic Date Due   Hepatitis C Screening  Never done   COVID-19 Vaccine (4 - 2023-24 season) 07/15/2022   Lung Cancer Screening  08/03/2022    Past Medical History:  Diagnosis Date   Anemia    Anxiety    Arthritis    Borderline diabetes    Cataract    bilat removed   Colon cancer ([place]) 2013   cecal cancer   COPD with emphysema ([place]) 06/18/2012   Depression    GERD (gastroesophageal reflux disease)    Hyperlipidemia 05/27/2021   Neuromuscular disorder (HCC)    neuropathy from chemo   Peptic ulcer    Pneumonia    Shortness of breath dyspnea    with exertion    Past Surgical History:  Procedure Laterality Date   APPENDECTOMY  07/07/11   BREAST EXCISIONAL BIOPSY Right 03/15/2018   COLON RESECTION  2012   for colon cancer   COLONOSCOPY     EYE SURGERY Bilateral    cataract surgery with lens implant   heel spurs Right    hemrrhoids     PLANTAR FASCIA SURGERY Right 2009   PORT-A-CATH REMOVAL Left 04/19/2016    Procedure: REMOVAL PORT-A-CATH;  Surgeon: [name], MD;  Location: [facility];  Service: General;  Laterality: Left;   portacath     RADIOACTIVE SEED GUIDED EXCISIONAL BREAST BIOPSY Right 03/15/2018   Procedure: RADIOACTIVE SEED GUIDED EXCISIONAL BREAST BIOPSY;  Surgeon: [name], MD;  Location: [facility];  Service: General;  Laterality: Right;   TUBAL LIGATION  07/1980    Family History  Problem Relation Age of Onset   Diabetes Mother    Colon cancer Cousin    Colon cancer Cousin    Thalassemia Daughter    Colon polyps Neg Hx    Rectal cancer Neg Hx    Stomach cancer Neg Hx     Social History   Socioeconomic History   Marital status: Married    Spouse name: Not on file   Number of children: 3   Years of education: Not on file   Highest education level: Not on file  Occupational History   Occupation: Armed forces operational officer: [employer]  Tobacco Use   Smoking status: Every Day    Packs/day: 1.00    Years: 45.00    Total pack years: 45.00    Types: Cigarettes    Start date: 04/23/1986   Smokeless tobacco:  Never   Tobacco comments:    Smokes 7 packs of cigarettes a week. 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, minerals   Married      Social Determinants of Radio broadcast assistant Strain: [place]  (09/06/2021)   Overall Financial Resource Strain (CARDIA)    Difficulty of Paying Living Expenses: Not hard at all  Food Insecurity: No [place] (09/06/2021)   Hunger Vital Sign    Worried About Running Out of Food in the Last Year: Never true    [place] in the Last Year: Never true   Transportation Needs: No Transportation Needs (09/06/2021)   PRAPARE - Hydrologist (Medical): No    Lack of Transportation (Non-Medical): No  Physical Activity: Inactive (09/06/2021)   Exercise Vital Sign    Days of Exercise per Week: 0 days    Minutes of Exercise per Session: 0 min  Stress: No Stress Concern Present (09/06/2021)   [place]    Feeling of Stress : Not at all  Social Connections: Socially Isolated (09/06/2021)   Social Connection and Isolation Panel [NHANES]    Frequency of Communication with Friends and Family: Once a week    Frequency of Social Gatherings with Friends and Family: Once a week    Attends Religious Services: Never    Marine scientist or Organizations: No    Attends Archivist Meetings: Never    Marital Status: Married  Human resources officer Violence: Not At Risk (09/06/2021)   Humiliation, Afraid, Rape, and Kick questionnaire    Fear of Current or Ex-Partner: No    Emotionally Abused: No    Physically Abused: No    Sexually Abused: No    Outpatient Medications Prior to Visit  Medication Sig Dispense Refill   albuterol (VENTOLIN HFA) 108 (90 Base) MCG/ACT inhaler INHALE 1 - 2 PUFFS INTO THE LUNGS EVERY 6 HOURS AS NEEDED FOR WHEEZING OR SHORTNESS OF BREATH 8.5 g 2   ALPRAZolam (XANAX) 1 MG tablet TAKE 1 TABLET BY MOUTH EVERY NIGHT AT BD AS NEEDED FOR ANXIETY OR SLEEP 30 tablet 0   atorvastatin (LIPITOR) 20 MG tablet Take 1 tablet (20 mg total) by mouth daily. 90 tablet 0   betamethasone dipropionate 0.05 % cream Apply topically 2 (two) times daily. 30 g 0   Calcium-Vitamin D 600-125 MG-UNIT TABS Take 1 tablet by mouth daily.     COVID-19 mRNA vaccine 2023-2024 (COMIRNATY) syringe Inject into the muscle. 0.3 mL 0   cyclobenzaprine (FLEXERIL) 5 MG tablet Take 1 tablet (5 mg total) by mouth 3 (three) times daily as needed for muscle spasms. 30  tablet 0   EPINEPHrine (EPIPEN 2-PAK) 0.3 mg/0.3 mL IJ SOAJ injection Inject 0.3 mg into the muscle as needed for anaphylaxis. 2 each 0   fluticasone-salmeterol (ADVAIR) 250-50 MCG/ACT AEPB Inhale 1 puff into the lungs every 12 (twelve) hours. 60 each 11   Multiple Vitamin (MULTIVITAMIN) tablet Take 1 tablet by mouth daily.  nicotine (NICOTROL) 10 MG inhaler Inhale 1 Cartridge (1 continuous puffing total) into the lungs as needed for smoking cessation. 150 each 0   oxyCODONE-acetaminophen (PERCOCET) 10-325 MG tablet Take 1 tablet by mouth 4 (four) times daily as needed. 120 tablet 0   oxyCODONE-acetaminophen (PERCOCET) 10-325 MG tablet TAKE 1 TABLET BY MOUTH (4) TIMES DAILY AS NEEDED. 120 tablet 0   [START ON 10/05/2022] oxyCODONE-acetaminophen (PERCOCET) 10-325 MG tablet Take 1 tablet by mouth 4 (four) times daily as needed. 120 tablet 0   TURMERIC PO Take 1 tablet by mouth daily.     vitamin E 200 UNIT capsule Take 200 Units by mouth daily.     vortioxetine HBr (TRINTELLIX) 5 MG TABS tablet Take 1 tablet (5 mg total) by mouth daily. 30 tablet 2   No facility-administered medications prior to visit.    Allergies  Allergen Reactions   Aspirin     Upset stomach   Cymbalta [Duloxetine Hcl] Swelling    Swollen lips    Prozac [Fluoxetine Hcl] Swelling    Lip swelling   Wellbutrin [Bupropion] Swelling    Lip swelling    Review of Systems  Gastrointestinal:  Positive for abdominal pain.  Genitourinary:  Positive for vaginal bleeding.       (+) Vaginal Bleeding       Objective:    Physical Exam Exam conducted with a chaperone present.  Constitutional:      General: She is not in acute distress.    Appearance: Normal appearance. She is not ill-appearing.  HENT:     Head: Normocephalic and atraumatic.     Right Ear: External ear normal.     Left Ear: External ear normal.  Eyes:     Extraocular Movements: Extraocular movements intact.     Pupils: Pupils are equal, round, and  reactive to light.  Neck:     Thyroid: No thyromegaly.  Cardiovascular:     Rate and Rhythm: Normal rate and regular rhythm.     Heart sounds: Normal heart sounds. No murmur heard.    No gallop.  Pulmonary:     Effort: Pulmonary effort is normal. No respiratory distress.     Breath sounds: Normal breath sounds. No wheezing or rales.  Chest:  Breasts:    Right: No mass or nipple discharge.     Left: No mass or nipple discharge.  Genitourinary:    General: Normal vulva.     Urethra: No prolapse, urethral pain, urethral swelling or urethral lesion.     Vagina: Normal.     Cervix: Normal.     Uterus: Normal.      Adnexa:        Right: No mass or tenderness.         Left: No mass or tenderness.       Rectum: Normal.     Comments: Atrophic vaginal tissue noted Lymphadenopathy:     Cervical: No cervical adenopathy.     Upper Body:     Right upper body: No axillary adenopathy.     Left upper body: No axillary adenopathy.     Lower Body: No right inguinal adenopathy. No left inguinal adenopathy.  Skin:    General: Skin is warm and dry.  Neurological:     Mental Status: She is alert and oriented to person, place, and time.     Deep Tendon Reflexes:     Reflex Scores:      Patellar reflexes are 2+ on the right side and  2+ on the left side. Psychiatric:        Mood and Affect: Mood normal.        Behavior: Behavior normal.        Judgment: Judgment normal.     BP 137/72 (BP Location: Right Arm, Patient Position: Sitting, Cuff Size: Small)   Pulse 98   Temp (!) 97.5 F (36.4 C) (Oral)   Resp 16   Wt 157 lb (71.2 kg)   SpO2 98%   BMI 23.87 kg/m  Wt Readings from Last 3 Encounters:  10/04/22 157 lb (71.2 kg)  08/02/22 153 lb (69.4 kg)  07/29/22 151 lb (68.5 kg)       Assessment & Plan:   Problem List Items Addressed This Visit       Unprioritized   Post-menopausal bleeding - Primary    New. Referral is already in the system for GYN. Discussed importance of  scheduling appointment with them for further evaluation. Pt verbalizes understanding.  Exam is normal today.       15 minutes spent on today's visit. Time was spent counseling pt on post menopausal bleeding and examining the patient.   No orders of the defined types were placed in this encounter.   I, Nance Pear, NP, personally preformed the services described in this documentation.  All medical record entries made by the scribe were at my direction and in my presence.  I have reviewed the chart and discharge instructions (if applicable) and agree that the record reflects my personal performance and is accurate and complete. 10/04/2022   I,Amber Collins,acting as a scribe for Nance Pear, NP.,have documented all relevant documentation on the behalf of Nance Pear, NP,as directed by  Nance Pear, NP while in the presence of Nance Pear, NP.    Nance Pear, NP

## 2022-10-04 NOTE — Assessment & Plan Note (Signed)
New. Referral is already in the system for GYN. Discussed importance of scheduling appointment with them for further evaluation. Pt verbalizes understanding.  Exam is normal today.

## 2022-10-04 NOTE — Patient Instructions (Addendum)
Please call GYN to schedule your appointment. [phone]

## 2022-10-05 ENCOUNTER — Other Ambulatory Visit (HOSPITAL_COMMUNITY): Payer: Self-pay

## 2022-10-17 ENCOUNTER — Other Ambulatory Visit: Payer: Self-pay | Admitting: Hematology & Oncology

## 2022-10-17 DIAGNOSIS — G629 Polyneuropathy, unspecified: Secondary | ICD-10-CM

## 2022-10-17 DIAGNOSIS — J430 Unilateral pulmonary emphysema [MacLeod's syndrome]: Secondary | ICD-10-CM

## 2022-10-17 DIAGNOSIS — C180 Malignant neoplasm of cecum: Secondary | ICD-10-CM

## 2022-10-24 ENCOUNTER — Other Ambulatory Visit: Payer: Self-pay | Admitting: Family

## 2022-10-28 ENCOUNTER — Ambulatory Visit (INDEPENDENT_AMBULATORY_CARE_PROVIDER_SITE_OTHER): Payer: Medicare Other | Admitting: Family

## 2022-10-28 VITALS — BP 124/80 | HR 95 | Temp 98.0°F | Resp 18 | Ht 68.5 in | Wt 155.6 lb

## 2022-10-28 DIAGNOSIS — M545 Low back pain, unspecified: Secondary | ICD-10-CM

## 2022-10-28 DIAGNOSIS — F32A Depression, unspecified: Secondary | ICD-10-CM | POA: Diagnosis not present

## 2022-10-28 DIAGNOSIS — F419 Anxiety disorder, unspecified: Secondary | ICD-10-CM

## 2022-10-28 DIAGNOSIS — J439 Emphysema, unspecified: Secondary | ICD-10-CM | POA: Diagnosis not present

## 2022-10-28 DIAGNOSIS — Z720 Tobacco use: Secondary | ICD-10-CM

## 2022-10-28 DIAGNOSIS — R7303 Prediabetes: Secondary | ICD-10-CM

## 2022-10-28 DIAGNOSIS — C180 Malignant neoplasm of cecum: Secondary | ICD-10-CM

## 2022-10-28 DIAGNOSIS — E785 Hyperlipidemia, unspecified: Secondary | ICD-10-CM | POA: Diagnosis not present

## 2022-10-28 DIAGNOSIS — G8929 Other chronic pain: Secondary | ICD-10-CM | POA: Diagnosis not present

## 2022-10-28 MED ORDER — CYCLOBENZAPRINE HCL 5 MG PO TABS: 5.0000 mg | ORAL_TABLET | Freq: Three times a day (TID) | ORAL | 0 refills | Status: DC | PRN

## 2022-10-28 MED ORDER — ATORVASTATIN CALCIUM 20 MG PO TABS: 20.0000 mg | ORAL_TABLET | Freq: Every day | ORAL | 1 refills | Status: DC

## 2022-10-28 NOTE — Assessment & Plan Note (Addendum)
She is taking '5mg'$  of trintellix.  She uses the xanax QHS- This is being prescribed by her oncologist.

## 2022-10-28 NOTE — Assessment & Plan Note (Signed)
Lab Results  Component Value Date   HGBA1C 6.3 06/17/2022   HGBA1C 6.0 05/26/2021   HGBA1C 5.6 03/12/2013   Lab Results  Component Value Date   LDLCALC 70 07/06/2021   CREATININE 0.99 07/25/2022   Repeat A1C.

## 2022-10-28 NOTE — Assessment & Plan Note (Signed)
Stable with advair and prn albuterol.

## 2022-10-28 NOTE — Assessment & Plan Note (Signed)
Lab Results  Component Value Date   CHOL 136 06/17/2022   HDL 35.20 (L) 06/17/2022   LDLCALC 70 07/06/2021   LDLDIRECT 75.0 06/17/2022   TRIG 206.0 (H) 06/17/2022   CHOLHDL 4 06/17/2022   LDL stable.  Continue lipitor.

## 2022-10-28 NOTE — Patient Instructions (Signed)
Please call GYN to schedule your appointment: [phone]

## 2022-10-28 NOTE — Assessment & Plan Note (Signed)
2013, s/p chemo. Continues to follow with oncology and is in remission.

## 2022-10-28 NOTE — Assessment & Plan Note (Signed)
Uses flexeril prn. She knows not to take with xanax or percocet.

## 2022-10-28 NOTE — Progress Notes (Signed)
Subjective:   By signing my name below, I, [name], attest that this documentation has been prepared under the direction and in the presence of Ann Held DO 10/25/2022   Patient ID: Teresa Morgan, female    DOB: 11-29-1955, 66 y.o.   MRN: [id number]  Chief Complaint  Patient presents with   3 month follow up    Concerns/ questions:      HPI Patient is in today for an office visit   Refill: She is requesting a refill of 20 mg of Lipitor.   Anxiety/Mood: She is currently taking 5 mg of Trintellix for her symptoms. She reports that her overall mood is fine. She takes her 1 mg of Xanax nightly. She states that when she does not take the medication, she finds trouble falling asleep.  A1C: Her A1C levels are stable Lab Results  Component Value Date   HGBA1C 6.3 06/17/2022   Hip Pain: She reports that her 5 mg of Flexeril was denied. She states that her hip pain is usually one-sided.   Smoking: She states that she is trying to quit smoking.   Ob/Gyn: She states that she has not been seen by an Ob/Gyn because her vaginal bleeding has stopped.   COPD: She states that her asthma is not bad. She is currently taking 250-50 mcg of Advair once a day. Symptoms worsen when temperatures change.  Blood Pressure: Her blood pressure during today's visit is elevated. Upon recheck her blood pressure read 124/80 mmHg.  BP Readings from Last 3 Encounters:  10/28/22 124/80  10/04/22 137/72  08/02/22 130/78   Pulse Readings from Last 3 Encounters:  10/28/22 95  10/04/22 98  08/02/22 93   Health Maintenance Due  Topic Date Due   Hepatitis C Screening  Never done   Lung Cancer Screening  08/03/2022   COVID-19 Vaccine (5 - 2023-24 season) 10/31/2022    Past Medical History:  Diagnosis Date   Anemia    Anxiety    Arthritis    Borderline diabetes    Cataract    bilat removed   Colon cancer ([place]) 2013   cecal cancer   COPD with emphysema ([place]) 06/18/2012   Depression     GERD (gastroesophageal reflux disease)    Hyperlipidemia 05/27/2021   Neuromuscular disorder (HCC)    neuropathy from chemo   Peptic ulcer    Pneumonia    Shortness of breath dyspnea    with exertion    Past Surgical History:  Procedure Laterality Date   APPENDECTOMY  07/07/11   BREAST EXCISIONAL BIOPSY Right 03/15/2018   COLON RESECTION  2012   for colon cancer   COLONOSCOPY     EYE SURGERY Bilateral    cataract surgery with lens implant   heel spurs Right    hemrrhoids     PLANTAR FASCIA SURGERY Right 2009   PORT-A-CATH REMOVAL Left 04/19/2016   Procedure: REMOVAL PORT-A-CATH;  Surgeon: [name], MD;  Location: [facility];  Service: General;  Laterality: Left;   portacath     RADIOACTIVE SEED GUIDED EXCISIONAL BREAST BIOPSY Right 03/15/2018   Procedure: RADIOACTIVE SEED GUIDED EXCISIONAL BREAST BIOPSY;  Surgeon: [name], MD;  Location: [facility];  Service: General;  Laterality: Right;   TUBAL LIGATION  07/1980    Family History  Problem Relation Age of Onset   Diabetes Mother    Colon cancer Cousin    Colon cancer Cousin    Thalassemia Daughter  Colon polyps Neg Hx    Rectal cancer Neg Hx    Stomach cancer Neg Hx     Social History   Socioeconomic History   Marital status: Married    Spouse name: Not on file   Number of children: 3   Years of education: Not on file   Highest education level: Not on file  Occupational History   Occupation: Armed forces operational officer: [employer]  Tobacco Use   Smoking status: Every Day    Packs/day: 1.00    Years: 45.00    Total pack years: 45.00    Types: Cigarettes    Start date: 04/23/1986   Smokeless tobacco: Never   Tobacco comments:    Smokes 7 packs of cigarettes a week. 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, minerals   Married      Social Determinants of Radio broadcast assistant Strain: [place]  (09/06/2021)   Overall Financial Resource Strain (CARDIA)    Difficulty of Paying Living Expenses: Not hard at all  Food Insecurity: No [place] (09/06/2021)   Hunger Vital Sign    Worried About Running Out of Food in the Last Year: Never true    [place] in the Last Year: Never true  Transportation Needs: No Transportation Needs (09/06/2021)   PRAPARE - Hydrologist (Medical): No    Lack of Transportation (Non-Medical): No  Physical Activity: Inactive (09/06/2021)   Exercise Vital Sign    Days of Exercise per Week: 0 days    Minutes of Exercise per Session: 0 min  Stress: No Stress Concern Present (09/06/2021)   [place]    Feeling of Stress : Not at all  Social Connections: Socially Isolated (09/06/2021)   Social Connection and Isolation Panel [NHANES]    Frequency of Communication with Friends and Family: Once a week    Frequency of Social Gatherings with Friends and Family: Once a week    Attends Religious Services: Never    Marine scientist or Organizations: No    Attends Archivist Meetings: Never    Marital Status: Married  Human resources officer Violence: Not At Risk (09/06/2021)   Humiliation, Afraid, Rape, and Kick questionnaire    Fear of Current or Ex-Partner: No    Emotionally Abused: No    Physically Abused: No    Sexually Abused: No    Outpatient Medications Prior to Visit  Medication Sig Dispense Refill   albuterol (VENTOLIN HFA) 108 (90 Base) MCG/ACT inhaler INHALE 1 - 2 PUFFS INTO THE LUNGS EVERY 6 HOURS AS NEEDED FOR WHEEZING OR SHORTNESS OF BREATH 8.5 g 2   ALPRAZolam (XANAX) 1 MG  tablet TAKE 1 TABLET BY MOUTH EVERY NIGHT AT BEDTIME AS NEEDED FOR ANXIETY OR SLEEP 30 tablet 0   betamethasone dipropionate 0.05 % cream Apply topically 2 (two) times daily. 30 g 0   Calcium-Vitamin D 600-125 MG-UNIT TABS Take 1 tablet by mouth daily.  COVID-19 mRNA vaccine 2023-2024 (COMIRNATY) syringe Inject into the muscle. 0.3 mL 0   EPINEPHrine (EPIPEN 2-PAK) 0.3 mg/0.3 mL IJ SOAJ injection Inject 0.3 mg into the muscle as needed for anaphylaxis. 2 each 0   fluticasone-salmeterol (ADVAIR) 250-50 MCG/ACT AEPB Inhale 1 puff into the lungs every 12 (twelve) hours. 60 each 11   Multiple Vitamin (MULTIVITAMIN) tablet Take 1 tablet by mouth daily.     nicotine (NICOTROL) 10 MG inhaler Inhale 1 Cartridge (1 continuous puffing total) into the lungs as needed for smoking cessation. 150 each 0   oxyCODONE-acetaminophen (PERCOCET) 10-325 MG tablet Take 1 tablet by mouth 4 (four) times daily as needed. 120 tablet 0   TURMERIC PO Take 1 tablet by mouth daily.     vitamin E 200 UNIT capsule Take 200 Units by mouth daily.     vortioxetine HBr (TRINTELLIX) 5 MG TABS tablet Take 1 tablet (5 mg total) by mouth daily. 30 tablet 2   atorvastatin (LIPITOR) 20 MG tablet Take 1 tablet (20 mg total) by mouth daily. 90 tablet 0   cyclobenzaprine (FLEXERIL) 5 MG tablet Take 1 tablet (5 mg total) by mouth 3 (three) times daily as needed for muscle spasms. 30 tablet 0   oxyCODONE-acetaminophen (PERCOCET) 10-325 MG tablet Take 1 tablet by mouth 4 (four) times daily as needed. 120 tablet 0   oxyCODONE-acetaminophen (PERCOCET) 10-325 MG tablet TAKE 1 TABLET BY MOUTH (4) TIMES DAILY AS NEEDED. 120 tablet 0   No facility-administered medications prior to visit.    Allergies  Allergen Reactions   Aspirin     Upset stomach   Cymbalta [Duloxetine Hcl] Swelling    Swollen lips    Prozac [Fluoxetine Hcl] Swelling    Lip swelling   Wellbutrin [Bupropion] Swelling    Lip swelling    ROS See HPI    Objective:     Physical Exam Constitutional:      General: She is not in acute distress.    Appearance: Normal appearance. She is not ill-appearing.  HENT:     Head: Normocephalic and atraumatic.     Right Ear: External ear normal.     Left Ear: External ear normal.  Eyes:     Extraocular Movements: Extraocular movements intact.     Pupils: Pupils are equal, round, and reactive to light.  Cardiovascular:     Rate and Rhythm: Normal rate and regular rhythm.     Heart sounds: Normal heart sounds. No murmur heard.    No gallop.  Pulmonary:     Effort: Pulmonary effort is normal. No respiratory distress.     Breath sounds: Normal breath sounds. No wheezing or rales.  Skin:    General: Skin is warm and dry.  Neurological:     Mental Status: She is alert and oriented to person, place, and time.  Psychiatric:        Mood and Affect: Mood normal.        Behavior: Behavior normal.        Judgment: Judgment normal.     BP 124/80 (BP Location: Right Arm, Patient Position: Sitting, Cuff Size: Normal)   Pulse 95   Temp 98 F (36.7 C) (Temporal)   Resp 18   Ht 5' 8.5" (1.74 m)   Wt 155 lb 9.6 oz (70.6 kg)   SpO2 95%   BMI 23.31 kg/m  Wt Readings from Last 3 Encounters:  10/28/22 155 lb 9.6 oz (70.6 kg)  10/04/22 157 lb (71.2 kg)  08/02/22 153 lb (69.4 kg)       Assessment & Plan:   Problem List Items Addressed This Visit       Unprioritized   Tobacco abuse   Relevant Orders   CT CHEST LUNG CA SCREEN LOW DOSE W/O CM   Hyperlipidemia    Lab Results  Component Value Date   CHOL 136 06/17/2022   HDL 35.20 (L) 06/17/2022   LDLCALC 70 07/06/2021   LDLDIRECT 75.0 06/17/2022   TRIG 206.0 (H) 06/17/2022   CHOLHDL 4 06/17/2022  LDL stable.  Continue lipitor.       Relevant Medications   atorvastatin (LIPITOR) 20 MG tablet   Other Relevant Orders   Lipid panel   COPD with emphysema, [place]    Stable with advair and prn albuterol.       Chronic low back pain    Uses flexeril prn.  She knows not to take with xanax or percocet.       Relevant Medications   cyclobenzaprine (FLEXERIL) 5 MG tablet   Cecal cancer, pT3N2a    2013, s/p chemo. Continues to follow with oncology and is in remission.       Borderline diabetes    Lab Results  Component Value Date   HGBA1C 6.3 06/17/2022   HGBA1C 6.0 05/26/2021   HGBA1C 5.6 03/12/2013   Lab Results  Component Value Date   LDLCALC 70 07/06/2021   CREATININE 0.99 07/25/2022  Repeat A1C.       Relevant Orders   HgB A1c   Comp Met (CMET)   Anxiety and depression - Primary    She is taking 51m of trintellix.  She uses the xanax QHS- This is being prescribed by her oncologist.        Meds ordered this encounter  Medications   cyclobenzaprine (FLEXERIL) 5 MG tablet    Sig: Take 1 tablet (5 mg total) by mouth 3 (three) times daily as needed for muscle spasms.    Dispense:  30 tablet    Refill:  0    Order Specific Question:   Supervising Provider    Answer:   BPenni HomansA [[id number]]   atorvastatin (LIPITOR) 20 MG tablet    Sig: Take 1 tablet (20 mg total) by mouth daily.    Dispense:  90 tablet    Refill:  1    Order Specific Question:   Supervising Provider    Answer:   BPenni HomansA [[id number]]    I, MNance Pear NP, personally preformed the services described in this documentation.  All medical record entries made by the scribe were at my direction and in my presence.  I have reviewed the chart and discharge instructions (if applicable) and agree that the record reflects my personal performance and is accurate and complete. 10/28/2022   I,Amber Collins,acting as a scribe for MNance Pear NP.,have documented all relevant documentation on the behalf of MNance Pear NP,as directed by  MNance Pear NP while in the presence of MNance Pear NP.    MNance Pear NP

## 2022-10-29 LAB — COMPREHENSIVE METABOLIC PANEL
AG Ratio: 1.6 (calc) (ref 1.0–2.5)
ALT: 20 U/L (ref 6–29)
AST: 37 U/L — ABNORMAL HIGH (ref 10–35)
Albumin: 4.2 g/dL (ref 3.6–5.1)
Alkaline phosphatase (APISO): 77 U/L (ref 37–153)
BUN: 13 mg/dL (ref 7–25)
CO2: 24 mmol/L (ref 20–32)
Calcium: 9 mg/dL (ref 8.6–10.4)
Chloride: 106 mmol/L (ref 98–110)
Creat: 1.03 mg/dL (ref 0.50–1.05)
Globulin: 2.7 g/dL (calc) (ref 1.9–3.7)
Glucose, Bld: 94 mg/dL (ref 65–99)
Potassium: 3.9 mmol/L (ref 3.5–5.3)
Sodium: 138 mmol/L (ref 135–146)
Total Bilirubin: 0.3 mg/dL (ref 0.2–1.2)
Total Protein: 6.9 g/dL (ref 6.1–8.1)

## 2022-10-29 LAB — LIPID PANEL
Cholesterol: 118 mg/dL (ref <200)
HDL: 42 mg/dL — ABNORMAL LOW (ref >=50)
LDL Cholesterol (Calc): 54 mg/dL (calc)
Non-HDL Cholesterol (Calc): 76 mg/dL (calc) (ref <130)
Total CHOL/HDL Ratio: 2.8 (calc) (ref <5.0)
Triglycerides: 141 mg/dL (ref <150)

## 2022-10-29 LAB — HEMOGLOBIN A1C
Hgb A1c MFr Bld: 6 % of total Hgb — ABNORMAL HIGH (ref <5.7)
Mean Plasma Glucose: 126 mg/dL
eAG (mmol/L): 7 mmol/L

## 2022-11-01 ENCOUNTER — Other Ambulatory Visit (HOSPITAL_COMMUNITY): Payer: Self-pay

## 2022-11-01 DIAGNOSIS — M545 Low back pain, unspecified: Secondary | ICD-10-CM | POA: Diagnosis not present

## 2022-11-01 DIAGNOSIS — F17210 Nicotine dependence, cigarettes, uncomplicated: Secondary | ICD-10-CM | POA: Diagnosis not present

## 2022-11-01 DIAGNOSIS — Z681 Body mass index (BMI) 19 or less, adult: Secondary | ICD-10-CM | POA: Diagnosis not present

## 2022-11-01 DIAGNOSIS — R030 Elevated blood-pressure reading, without diagnosis of hypertension: Secondary | ICD-10-CM | POA: Diagnosis not present

## 2022-11-01 DIAGNOSIS — Z79899 Other long term (current) drug therapy: Secondary | ICD-10-CM | POA: Diagnosis not present

## 2022-11-01 MED ORDER — OXYCODONE-ACETAMINOPHEN 10-325 MG PO TABS
1.0000 | ORAL_TABLET | Freq: Four times a day (QID) | ORAL | 0 refills | Status: DC | PRN
  Filled 2022-11-02 – 2022-11-04 (×3): qty 120, 30d supply, fill #0

## 2022-11-02 ENCOUNTER — Other Ambulatory Visit (HOSPITAL_COMMUNITY): Payer: Self-pay

## 2022-11-04 ENCOUNTER — Other Ambulatory Visit (HOSPITAL_COMMUNITY): Payer: Self-pay

## 2022-11-15 ENCOUNTER — Other Ambulatory Visit: Payer: Self-pay | Admitting: Hematology & Oncology

## 2022-11-15 DIAGNOSIS — C180 Malignant neoplasm of cecum: Secondary | ICD-10-CM

## 2022-11-15 DIAGNOSIS — G629 Polyneuropathy, unspecified: Secondary | ICD-10-CM

## 2022-11-15 DIAGNOSIS — J430 Unilateral pulmonary emphysema [MacLeod's syndrome]: Secondary | ICD-10-CM

## 2022-11-17 ENCOUNTER — Ambulatory Visit (HOSPITAL_BASED_OUTPATIENT_CLINIC_OR_DEPARTMENT_OTHER)
Admission: RE | Admit: 2022-11-17 | Discharge: 2022-11-17 | Disposition: A | Discharge status: Home / Self Care | Payer: Medicare Other | Source: Ambulatory Visit | Attending: Family | Admitting: Family

## 2022-11-17 ENCOUNTER — Other Ambulatory Visit (HOSPITAL_BASED_OUTPATIENT_CLINIC_OR_DEPARTMENT_OTHER): Payer: Self-pay

## 2022-11-17 DIAGNOSIS — Z87891 Personal history of nicotine dependence: Secondary | ICD-10-CM | POA: Diagnosis not present

## 2022-11-17 DIAGNOSIS — Z122 Encounter for screening for malignant neoplasm of respiratory organs: Secondary | ICD-10-CM | POA: Insufficient documentation

## 2022-11-17 DIAGNOSIS — Z720 Tobacco use: Secondary | ICD-10-CM | POA: Diagnosis present

## 2022-11-20 ENCOUNTER — Other Ambulatory Visit: Payer: Self-pay | Admitting: Family

## 2022-11-29 ENCOUNTER — Other Ambulatory Visit: Payer: Self-pay | Admitting: Family

## 2022-12-03 ENCOUNTER — Other Ambulatory Visit (HOSPITAL_COMMUNITY): Payer: Self-pay

## 2022-12-03 DIAGNOSIS — M545 Low back pain, unspecified: Secondary | ICD-10-CM | POA: Diagnosis not present

## 2022-12-03 DIAGNOSIS — Z681 Body mass index (BMI) 19 or less, adult: Secondary | ICD-10-CM | POA: Diagnosis not present

## 2022-12-03 DIAGNOSIS — Z79899 Other long term (current) drug therapy: Secondary | ICD-10-CM | POA: Diagnosis not present

## 2022-12-03 DIAGNOSIS — F17210 Nicotine dependence, cigarettes, uncomplicated: Secondary | ICD-10-CM | POA: Diagnosis not present

## 2022-12-03 DIAGNOSIS — R030 Elevated blood-pressure reading, without diagnosis of hypertension: Secondary | ICD-10-CM | POA: Diagnosis not present

## 2022-12-03 MED ORDER — OXYCODONE-ACETAMINOPHEN 10-325 MG PO TABS
ORAL_TABLET | ORAL | 0 refills | Status: DC
  Filled 2022-12-03: qty 120, 30d supply, fill #0

## 2022-12-05 ENCOUNTER — Other Ambulatory Visit (HOSPITAL_COMMUNITY): Payer: Self-pay

## 2022-12-16 ENCOUNTER — Other Ambulatory Visit: Payer: Self-pay | Admitting: Hematology & Oncology

## 2022-12-16 DIAGNOSIS — C180 Malignant neoplasm of cecum: Secondary | ICD-10-CM

## 2022-12-16 DIAGNOSIS — J430 Unilateral pulmonary emphysema [MacLeod's syndrome]: Secondary | ICD-10-CM

## 2022-12-16 DIAGNOSIS — G629 Polyneuropathy, unspecified: Secondary | ICD-10-CM

## 2022-12-20 ENCOUNTER — Other Ambulatory Visit: Payer: Self-pay | Admitting: Family

## 2022-12-29 DIAGNOSIS — J019 Acute sinusitis, unspecified: Secondary | ICD-10-CM | POA: Diagnosis not present

## 2022-12-29 DIAGNOSIS — R051 Acute cough: Secondary | ICD-10-CM | POA: Diagnosis not present

## 2023-01-03 ENCOUNTER — Other Ambulatory Visit (HOSPITAL_COMMUNITY): Payer: Self-pay

## 2023-01-03 DIAGNOSIS — Z79899 Other long term (current) drug therapy: Secondary | ICD-10-CM | POA: Diagnosis not present

## 2023-01-03 DIAGNOSIS — Z681 Body mass index (BMI) 19 or less, adult: Secondary | ICD-10-CM | POA: Diagnosis not present

## 2023-01-03 DIAGNOSIS — R030 Elevated blood-pressure reading, without diagnosis of hypertension: Secondary | ICD-10-CM | POA: Diagnosis not present

## 2023-01-03 DIAGNOSIS — M545 Low back pain, unspecified: Secondary | ICD-10-CM | POA: Diagnosis not present

## 2023-01-03 DIAGNOSIS — F17210 Nicotine dependence, cigarettes, uncomplicated: Secondary | ICD-10-CM | POA: Diagnosis not present

## 2023-01-03 MED ORDER — OXYCODONE-ACETAMINOPHEN 10-325 MG PO TABS
1.0000 | ORAL_TABLET | Freq: Four times a day (QID) | ORAL | 0 refills | Status: DC | PRN
  Filled 2023-01-03: qty 120, 30d supply, fill #0

## 2023-01-12 ENCOUNTER — Other Ambulatory Visit: Payer: Self-pay | Admitting: Family

## 2023-01-13 ENCOUNTER — Other Ambulatory Visit: Payer: Self-pay | Admitting: Hematology & Oncology

## 2023-01-13 DIAGNOSIS — J430 Unilateral pulmonary emphysema [MacLeod's syndrome]: Secondary | ICD-10-CM

## 2023-01-13 DIAGNOSIS — G629 Polyneuropathy, unspecified: Secondary | ICD-10-CM

## 2023-01-13 DIAGNOSIS — C180 Malignant neoplasm of cecum: Secondary | ICD-10-CM

## 2023-01-31 ENCOUNTER — Ambulatory Visit: Payer: Medicare Other | Admitting: Family

## 2023-01-31 ENCOUNTER — Other Ambulatory Visit (HOSPITAL_COMMUNITY): Payer: Self-pay

## 2023-01-31 DIAGNOSIS — Z681 Body mass index (BMI) 19 or less, adult: Secondary | ICD-10-CM | POA: Diagnosis not present

## 2023-01-31 DIAGNOSIS — M545 Low back pain, unspecified: Secondary | ICD-10-CM | POA: Diagnosis not present

## 2023-01-31 DIAGNOSIS — Z79899 Other long term (current) drug therapy: Secondary | ICD-10-CM | POA: Diagnosis not present

## 2023-01-31 DIAGNOSIS — F17210 Nicotine dependence, cigarettes, uncomplicated: Secondary | ICD-10-CM | POA: Diagnosis not present

## 2023-01-31 MED ORDER — OXYCODONE-ACETAMINOPHEN 10-325 MG PO TABS
1.0000 | ORAL_TABLET | Freq: Four times a day (QID) | ORAL | 0 refills | Status: DC | PRN
  Filled 2023-02-01: qty 120, 30d supply, fill #0

## 2023-02-01 ENCOUNTER — Other Ambulatory Visit (HOSPITAL_COMMUNITY): Payer: Self-pay

## 2023-02-02 ENCOUNTER — Ambulatory Visit (INDEPENDENT_AMBULATORY_CARE_PROVIDER_SITE_OTHER): Payer: Medicare Other | Admitting: Family

## 2023-02-02 ENCOUNTER — Encounter: Payer: Self-pay | Admitting: Family

## 2023-02-02 VITALS — BP 144/80 | HR 103 | Resp 18 | Ht 68.5 in | Wt 156.8 lb

## 2023-02-02 DIAGNOSIS — F32A Depression, unspecified: Secondary | ICD-10-CM | POA: Diagnosis not present

## 2023-02-02 DIAGNOSIS — M545 Low back pain, unspecified: Secondary | ICD-10-CM

## 2023-02-02 DIAGNOSIS — R739 Hyperglycemia, unspecified: Secondary | ICD-10-CM

## 2023-02-02 DIAGNOSIS — R7303 Prediabetes: Secondary | ICD-10-CM | POA: Diagnosis not present

## 2023-02-02 DIAGNOSIS — Z1159 Encounter for screening for other viral diseases: Secondary | ICD-10-CM | POA: Diagnosis not present

## 2023-02-02 DIAGNOSIS — E785 Hyperlipidemia, unspecified: Secondary | ICD-10-CM | POA: Diagnosis not present

## 2023-02-02 DIAGNOSIS — D500 Iron deficiency anemia secondary to blood loss (chronic): Secondary | ICD-10-CM

## 2023-02-02 DIAGNOSIS — Z720 Tobacco use: Secondary | ICD-10-CM

## 2023-02-02 DIAGNOSIS — F419 Anxiety disorder, unspecified: Secondary | ICD-10-CM

## 2023-02-02 DIAGNOSIS — G8929 Other chronic pain: Secondary | ICD-10-CM | POA: Diagnosis not present

## 2023-02-02 LAB — BASIC METABOLIC PANEL
BUN: 10 mg/dL (ref 6–23)
CO2: 28 mEq/L (ref 19–32)
Calcium: 8.7 mg/dL (ref 8.4–10.5)
Chloride: 105 mEq/L (ref 96–112)
Creatinine, Ser: 1.14 mg/dL (ref 0.40–1.20)
GFR: 49.68 mL/min — ABNORMAL LOW (ref >60.00)
Glucose, Bld: 107 mg/dL — ABNORMAL HIGH (ref 70–99)
Potassium: 3.6 mEq/L (ref 3.5–5.1)
Sodium: 140 mEq/L (ref 135–145)

## 2023-02-02 LAB — HEMOGLOBIN A1C: Hgb A1c MFr Bld: 6.3 % (ref 4.6–6.5)

## 2023-02-02 NOTE — Assessment & Plan Note (Addendum)
Lab Results  Component Value Date   HGBA1C 6.0 (H) 10/28/2022   HGBA1C 6.3 06/17/2022   HGBA1C 6.0 05/26/2021   Lab Results  Component Value Date   LDLCALC 54 10/28/2022   CREATININE 1.03 10/28/2022   Has been stable with diet. Monitor.

## 2023-02-02 NOTE — Assessment & Plan Note (Signed)
Lab Results  Component Value Date   CHOL 118 10/28/2022   HDL 42 (L) 10/28/2022   LDLCALC 54 10/28/2022   LDLDIRECT 75.0 06/17/2022   TRIG 141 10/28/2022   CHOLHDL 2.8 10/28/2022   At goal, continue atorvastatin.

## 2023-02-02 NOTE — Progress Notes (Signed)
Subjective:     Patient ID: Teresa Morgan, female    DOB: [birth year], 67 y.o.   MRN: [id number]  Chief Complaint  Patient presents with   Follow-up    No concerns today     HPI Patient is in today for follow up. She has no complaints.   She is still smoking but has cut back.   COPD- followed by pulmonology.   Last A1C was stable   Health Maintenance Due  Topic Date Due   Hepatitis C Screening  Never done   COVID-19 Vaccine (5 - 2023-24 season) 10/31/2022    Past Medical History:  Diagnosis Date   Anemia    Anxiety    Arthritis    Borderline diabetes    Cataract    bilat removed   Colon cancer ([place]) 2013   cecal cancer   COPD with emphysema ([place]) 06/18/2012   Depression    GERD (gastroesophageal reflux disease)    Hyperlipidemia 05/27/2021   Neuromuscular disorder (HCC)    neuropathy from chemo   Peptic ulcer    Pneumonia    Shortness of breath dyspnea    with exertion    Past Surgical History:  Procedure Laterality Date   APPENDECTOMY  07/07/11   BREAST EXCISIONAL BIOPSY Right 03/15/2018   COLON RESECTION  2012   for colon cancer   COLONOSCOPY     EYE SURGERY Bilateral    cataract surgery with lens implant   heel spurs Right    hemrrhoids     PLANTAR FASCIA SURGERY Right 2009   PORT-A-CATH REMOVAL Left 04/19/2016   Procedure: REMOVAL PORT-A-CATH;  Surgeon: [name], MD;  Location: [facility];  Service: General;  Laterality: Left;   portacath     RADIOACTIVE SEED GUIDED EXCISIONAL BREAST BIOPSY Right 03/15/2018   Procedure: RADIOACTIVE SEED GUIDED EXCISIONAL BREAST BIOPSY;  Surgeon: [name], MD;  Location: [facility];  Service: General;  Laterality: Right;   TUBAL LIGATION  07/1980    Family History  Problem Relation Age of Onset   Diabetes Mother    Colon cancer Cousin    Colon cancer Cousin    Thalassemia Daughter    Colon polyps Neg Hx    Rectal cancer Neg Hx    Stomach cancer Neg Hx     Social History   Socioeconomic  History   Marital status: Married    Spouse name: Not on file   Number of children: 3   Years of education: Not on file   Highest education level: Not on file  Occupational History   Occupation: Armed forces operational officer: [employer]  Tobacco Use   Smoking status: Every Day    Packs/day: 1.00    Years: 45.00    Additional pack years: 0.00    Total pack years: 45.00    Types: Cigarettes    Start date: 04/23/1986   Smokeless tobacco: Never   Tobacco comments:    Smokes 7 packs of cigarettes a week. 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, minerals  Married      Social Determinants of Health   Financial Resource Strain: Low Risk  (09/06/2021)   Overall Financial Resource Strain (CARDIA)    Difficulty of Paying Living Expenses: Not hard at all  Food Insecurity: No Food Insecurity (09/06/2021)   Hunger Vital Sign    Worried About Running Out of Food in the Last Year: Never true    Ran Out of Food in the Last Year: Never true  Transportation Needs: No Transportation Needs (09/06/2021)   PRAPARE - Hydrologist (Medical): No    Lack of Transportation (Non-Medical): No  Physical Activity: Inactive (09/06/2021)   Exercise Vital Sign    Days of Exercise per Week: 0 days    Minutes of Exercise per Session: 0 min  Stress: No Stress Concern Present (09/06/2021)   [place]    Feeling of Stress : Not at all  Social Connections: Socially Isolated (09/06/2021)   Social Connection and Isolation Panel [NHANES]    Frequency of Communication with  Friends and Family: Once a week    Frequency of Social Gatherings with Friends and Family: Once a week    Attends Religious Services: Never    Marine scientist or Organizations: No    Attends Archivist Meetings: Never    Marital Status: Married  Human resources officer Violence: Not At Risk (09/06/2021)   Humiliation, Afraid, Rape, and Kick questionnaire    Fear of Current or Ex-Partner: No    Emotionally Abused: No    Physically Abused: No    Sexually Abused: No    Outpatient Medications Prior to Visit  Medication Sig Dispense Refill   ALPRAZolam (XANAX) 1 MG tablet TAKE 1 TABLET BY MOUTH EVERY NIGHT AT BEDTIME AS NEEDED FOR ANXIETY OR SLEEP 30 tablet 0   atorvastatin (LIPITOR) 20 MG tablet Take 1 tablet (20 mg total) by mouth daily. 90 tablet 1   betamethasone dipropionate 0.05 % cream Apply topically 2 (two) times daily. 30 g 0   Calcium-Vitamin D 600-125 MG-UNIT TABS Take 1 tablet by mouth daily.     EPINEPHrine (EPIPEN 2-PAK) 0.3 mg/0.3 mL IJ SOAJ injection Inject 0.3 mg into the muscle as needed for anaphylaxis. 2 each 0   fluticasone-salmeterol (ADVAIR) 250-50 MCG/ACT AEPB Inhale 1 puff into the lungs every 12 (twelve) hours. 60 each 11   Multiple Vitamin (MULTIVITAMIN) tablet Take 1 tablet by mouth daily.     nicotine (NICOTROL) 10 MG inhaler Inhale 1 Cartridge (1 continuous puffing total) into the lungs as needed for smoking cessation. 150 each 0   oxyCODONE-acetaminophen (PERCOCET) 10-325 MG tablet Take 1 tablet by mouth 4 (four) times daily as needed. 120 tablet 0   oxyCODONE-acetaminophen (PERCOCET) 10-325 MG tablet Take 1 (one) tablet by mouth four times daily as needed 120 tablet 0   oxyCODONE-acetaminophen (PERCOCET) 10-325 MG tablet Take 1 tablet by mouth 4 (four) times daily as needed. 120 tablet 0   TRINTELLIX 5 MG TABS tablet TAKE 1 TABLET(5 MG) BY MOUTH DAILY 30 tablet 2   TURMERIC PO Take 1 tablet by mouth daily.     vitamin E 200 UNIT capsule Take 200  Units by mouth daily.     COVID-19 mRNA vaccine 2023-2024 (COMIRNATY) syringe Inject into the muscle. 0.3 mL 0   cyclobenzaprine (FLEXERIL) 5 MG tablet TAKE 1 TABLET(5 MG) BY MOUTH THREE TIMES DAILY AS NEEDED FOR MUSCLE SPASMS 30 tablet 0  albuterol (VENTOLIN HFA) 108 (90 Base) MCG/ACT inhaler INHALE 1 - 2 PUFFS INTO THE LUNGS EVERY 6 HOURS AS NEEDED FOR WHEEZING OR SHORTNESS OF BREATH 8.5 g 2   No facility-administered medications prior to visit.    Allergies  Allergen Reactions   Aspirin     Upset stomach   Cymbalta [Duloxetine Hcl] Swelling    Swollen lips    Prozac [Fluoxetine Hcl] Swelling    Lip swelling   Wellbutrin [Bupropion] Swelling    Lip swelling    ROS    See HPI Objective:    Physical Exam Constitutional:      General: She is not in acute distress.    Appearance: Normal appearance. She is well-developed.  HENT:     Head: Normocephalic and atraumatic.     Right Ear: External ear normal.     Left Ear: External ear normal.  Eyes:     General: No scleral icterus. Neck:     Thyroid: No thyromegaly.  Cardiovascular:     Rate and Rhythm: Normal rate and regular rhythm.     Heart sounds: Normal heart sounds. No murmur heard. Pulmonary:     Effort: Pulmonary effort is normal. No respiratory distress.     Breath sounds: Normal breath sounds. No wheezing.  Musculoskeletal:     Cervical back: Neck supple.  Skin:    General: Skin is warm and dry.  Neurological:     Mental Status: She is alert and oriented to person, place, and time.  Psychiatric:        Mood and Affect: Mood normal.        Behavior: Behavior normal.        Thought Content: Thought content normal.        Judgment: Judgment normal.     BP (!) 144/80 (BP Location: Right Arm, Patient Position: Sitting, Cuff Size: Normal)   Pulse (!) 103   Resp 18   Ht 5' 8.5" (1.74 m)   Wt 156 lb 12.8 oz (71.1 kg)   SpO2 98%   BMI 23.49 kg/m  Wt Readings from Last 3 Encounters:  02/02/23 156 lb 12.8 oz  (71.1 kg)  10/28/22 155 lb 9.6 oz (70.6 kg)  10/04/22 157 lb (71.2 kg)       Assessment & Plan:   Problem List Items Addressed This Visit       Unprioritized   Tobacco abuse - Primary    She is down to <1/2 pack per day.  Urged complete cessation.        Iron deficiency anemia due to chronic blood loss    Lab Results  Component Value Date   WBC 9.0 07/25/2022   HGB 14.1 07/25/2022   HCT 45.0 07/25/2022   MCV 74.5 (L) 07/25/2022   PLT 159 07/25/2022  Has been stable.        Hyperlipidemia    Lab Results  Component Value Date   CHOL 118 10/28/2022   HDL 42 (L) 10/28/2022   LDLCALC 54 10/28/2022   LDLDIRECT 75.0 06/17/2022   TRIG 141 10/28/2022   CHOLHDL 2.8 10/28/2022  At goal, continue atorvastatin.       Chronic low back pain    Followed by pain clinic.  Willing to D/C flexeril due to it being a high risk medication for her age.        Borderline diabetes    Lab Results  Component Value Date   HGBA1C 6.0 (H) 10/28/2022   HGBA1C 6.3 06/17/2022  HGBA1C 6.0 05/26/2021   Lab Results  Component Value Date   LDLCALC 54 10/28/2022   CREATININE 1.03 10/28/2022  Has been stable with diet. Monitor.       Anxiety and depression    Stable on Trintellix, continue same.       Other Visit Diagnoses     Encounter for hepatitis C screening test for low risk patient       Relevant Orders   Hepatitis C Antibody   Hyperglycemia       Relevant Orders   HgB 123456   Basic Metabolic Panel (BMET)       I have discontinued Abbegayle L. Gunderson's Comirnaty and cyclobenzaprine. I am also having her maintain her multivitamin, vitamin E, TURMERIC PO, Calcium-Vitamin D, EPINEPHrine, albuterol, nicotine, fluticasone-salmeterol, betamethasone dipropionate, atorvastatin, oxyCODONE-acetaminophen, Trintellix, oxyCODONE-acetaminophen, ALPRAZolam, and oxyCODONE-acetaminophen.  No orders of the defined types were placed in this encounter.

## 2023-02-02 NOTE — Assessment & Plan Note (Addendum)
She is down to <1/2 pack per day.  Urged complete cessation.

## 2023-02-02 NOTE — Assessment & Plan Note (Addendum)
Followed by pain clinic.  Willing to D/C flexeril due to it being a high risk medication for her age.

## 2023-02-02 NOTE — Assessment & Plan Note (Signed)
Stable on Trintellix, continue same.

## 2023-02-02 NOTE — Assessment & Plan Note (Addendum)
Lab Results  Component Value Date   WBC 9.0 07/25/2022   HGB 14.1 07/25/2022   HCT 45.0 07/25/2022   MCV 74.5 (L) 07/25/2022   PLT 159 07/25/2022   Has been stable.

## 2023-02-03 ENCOUNTER — Telehealth: Payer: Self-pay | Admitting: Family

## 2023-02-03 DIAGNOSIS — R944 Abnormal results of kidney function studies: Secondary | ICD-10-CM

## 2023-02-03 LAB — HEPATITIS C ANTIBODY: Hepatitis C Ab: NONREACTIVE

## 2023-02-03 NOTE — Telephone Encounter (Signed)
LMOM for pt to return call for results. 

## 2023-02-03 NOTE — Telephone Encounter (Signed)
Sugar is in the borderline diabetes range.  Also, kidney function is mildly reduced. Is she taking any NSAIDS (ibuprofen/aleve etc.).  If so, please discontinue and switch to tylenol.  Increase hydration and repeat bmet in 2 weeks, dx renal insufficieny.Teresa Morgan

## 2023-02-07 NOTE — Telephone Encounter (Signed)
Called patient but no answer, left voice mail for patinet to call back.   

## 2023-02-08 NOTE — Addendum Note (Signed)
Addended by: Jiles Prows on: 02/08/2023 02:00 PM   Modules accepted: Orders

## 2023-02-08 NOTE — Telephone Encounter (Signed)
Patient notified of results and she denies use of NSAIDS. She was advised to increase hydration and schedule to come back in 2 weeks for BMET

## 2023-02-14 ENCOUNTER — Other Ambulatory Visit: Payer: Self-pay | Admitting: Hematology & Oncology

## 2023-02-14 DIAGNOSIS — J430 Unilateral pulmonary emphysema [MacLeod's syndrome]: Secondary | ICD-10-CM

## 2023-02-14 DIAGNOSIS — G629 Polyneuropathy, unspecified: Secondary | ICD-10-CM

## 2023-02-14 DIAGNOSIS — C180 Malignant neoplasm of cecum: Secondary | ICD-10-CM

## 2023-02-20 ENCOUNTER — Other Ambulatory Visit: Payer: Self-pay | Admitting: Family

## 2023-02-22 ENCOUNTER — Other Ambulatory Visit (INDEPENDENT_AMBULATORY_CARE_PROVIDER_SITE_OTHER): Payer: Medicare Other

## 2023-02-22 DIAGNOSIS — R944 Abnormal results of kidney function studies: Secondary | ICD-10-CM

## 2023-02-22 LAB — BASIC METABOLIC PANEL
BUN: 10 mg/dL (ref 6–23)
CO2: 28 mEq/L (ref 19–32)
Calcium: 8.7 mg/dL (ref 8.4–10.5)
Chloride: 102 mEq/L (ref 96–112)
Creatinine, Ser: 0.88 mg/dL (ref 0.40–1.20)
GFR: 67.75 mL/min (ref >60.00)
Glucose, Bld: 109 mg/dL — ABNORMAL HIGH (ref 70–99)
Potassium: 4 mEq/L (ref 3.5–5.1)
Sodium: 138 mEq/L (ref 135–145)

## 2023-02-27 ENCOUNTER — Other Ambulatory Visit (HOSPITAL_COMMUNITY): Payer: Self-pay

## 2023-02-27 DIAGNOSIS — Z79899 Other long term (current) drug therapy: Secondary | ICD-10-CM | POA: Diagnosis not present

## 2023-02-27 DIAGNOSIS — M545 Low back pain, unspecified: Secondary | ICD-10-CM | POA: Diagnosis not present

## 2023-02-27 DIAGNOSIS — Z681 Body mass index (BMI) 19 or less, adult: Secondary | ICD-10-CM | POA: Diagnosis not present

## 2023-02-27 DIAGNOSIS — F17210 Nicotine dependence, cigarettes, uncomplicated: Secondary | ICD-10-CM | POA: Diagnosis not present

## 2023-02-27 MED ORDER — OXYCODONE-ACETAMINOPHEN 10-325 MG PO TABS
1.0000 | ORAL_TABLET | Freq: Four times a day (QID) | ORAL | 0 refills | Status: DC | PRN
  Filled 2023-02-27 – 2023-03-03 (×2): qty 120, 30d supply, fill #0

## 2023-03-02 ENCOUNTER — Other Ambulatory Visit (HOSPITAL_COMMUNITY): Payer: Self-pay

## 2023-03-03 ENCOUNTER — Other Ambulatory Visit (HOSPITAL_COMMUNITY): Payer: Self-pay

## 2023-03-15 ENCOUNTER — Other Ambulatory Visit: Payer: Self-pay | Admitting: Hematology & Oncology

## 2023-03-15 DIAGNOSIS — C180 Malignant neoplasm of cecum: Secondary | ICD-10-CM

## 2023-03-15 DIAGNOSIS — G629 Polyneuropathy, unspecified: Secondary | ICD-10-CM

## 2023-03-15 DIAGNOSIS — J430 Unilateral pulmonary emphysema [MacLeod's syndrome]: Secondary | ICD-10-CM

## 2023-03-28 ENCOUNTER — Other Ambulatory Visit (HOSPITAL_COMMUNITY): Payer: Self-pay

## 2023-03-28 DIAGNOSIS — Z79899 Other long term (current) drug therapy: Secondary | ICD-10-CM | POA: Diagnosis not present

## 2023-03-28 DIAGNOSIS — M545 Low back pain, unspecified: Secondary | ICD-10-CM | POA: Diagnosis not present

## 2023-03-28 DIAGNOSIS — F17210 Nicotine dependence, cigarettes, uncomplicated: Secondary | ICD-10-CM | POA: Diagnosis not present

## 2023-03-28 DIAGNOSIS — R030 Elevated blood-pressure reading, without diagnosis of hypertension: Secondary | ICD-10-CM | POA: Diagnosis not present

## 2023-03-28 MED ORDER — OXYCODONE-ACETAMINOPHEN 10-325 MG PO TABS
1.0000 | ORAL_TABLET | Freq: Four times a day (QID) | ORAL | 0 refills | Status: DC | PRN
  Filled 2023-04-01: qty 120, 30d supply, fill #0

## 2023-03-31 ENCOUNTER — Other Ambulatory Visit (HOSPITAL_COMMUNITY): Payer: Self-pay

## 2023-04-01 ENCOUNTER — Other Ambulatory Visit (HOSPITAL_COMMUNITY): Payer: Self-pay

## 2023-04-15 ENCOUNTER — Other Ambulatory Visit: Payer: Self-pay | Admitting: Hematology & Oncology

## 2023-04-15 DIAGNOSIS — G629 Polyneuropathy, unspecified: Secondary | ICD-10-CM

## 2023-04-15 DIAGNOSIS — J430 Unilateral pulmonary emphysema [MacLeod's syndrome]: Secondary | ICD-10-CM

## 2023-04-15 DIAGNOSIS — C180 Malignant neoplasm of cecum: Secondary | ICD-10-CM

## 2023-04-27 ENCOUNTER — Other Ambulatory Visit: Payer: Self-pay | Admitting: Family

## 2023-05-01 ENCOUNTER — Other Ambulatory Visit (HOSPITAL_COMMUNITY): Payer: Self-pay

## 2023-05-01 DIAGNOSIS — Z79899 Other long term (current) drug therapy: Secondary | ICD-10-CM | POA: Diagnosis not present

## 2023-05-01 DIAGNOSIS — Z6823 Body mass index (BMI) 23.0-23.9, adult: Secondary | ICD-10-CM | POA: Diagnosis not present

## 2023-05-01 DIAGNOSIS — M545 Low back pain, unspecified: Secondary | ICD-10-CM | POA: Diagnosis not present

## 2023-05-01 DIAGNOSIS — F17210 Nicotine dependence, cigarettes, uncomplicated: Secondary | ICD-10-CM | POA: Diagnosis not present

## 2023-05-01 MED ORDER — OXYCODONE-ACETAMINOPHEN 10-325 MG PO TABS
ORAL_TABLET | ORAL | 0 refills | Status: DC
  Filled 2023-05-01: qty 120, 30d supply, fill #0

## 2023-05-04 ENCOUNTER — Encounter: Payer: Self-pay | Admitting: Gastroenterology

## 2023-05-16 ENCOUNTER — Other Ambulatory Visit: Payer: Self-pay | Admitting: Hematology & Oncology

## 2023-05-16 DIAGNOSIS — C180 Malignant neoplasm of cecum: Secondary | ICD-10-CM

## 2023-05-16 DIAGNOSIS — J430 Unilateral pulmonary emphysema [MacLeod's syndrome]: Secondary | ICD-10-CM

## 2023-05-16 DIAGNOSIS — G629 Polyneuropathy, unspecified: Secondary | ICD-10-CM

## 2023-05-21 ENCOUNTER — Other Ambulatory Visit: Payer: Self-pay | Admitting: Family

## 2023-05-30 ENCOUNTER — Other Ambulatory Visit (HOSPITAL_COMMUNITY): Payer: Self-pay

## 2023-05-30 DIAGNOSIS — Z79899 Other long term (current) drug therapy: Secondary | ICD-10-CM | POA: Diagnosis not present

## 2023-05-30 DIAGNOSIS — M545 Low back pain, unspecified: Secondary | ICD-10-CM | POA: Diagnosis not present

## 2023-05-30 DIAGNOSIS — F17210 Nicotine dependence, cigarettes, uncomplicated: Secondary | ICD-10-CM | POA: Diagnosis not present

## 2023-05-30 MED ORDER — OXYCODONE-ACETAMINOPHEN 10-325 MG PO TABS
1.0000 | ORAL_TABLET | Freq: Four times a day (QID) | ORAL | 0 refills | Status: DC
  Filled 2023-05-30: qty 120, 30d supply, fill #0

## 2023-06-14 ENCOUNTER — Other Ambulatory Visit: Payer: Self-pay | Admitting: Hematology & Oncology

## 2023-06-14 DIAGNOSIS — G629 Polyneuropathy, unspecified: Secondary | ICD-10-CM

## 2023-06-14 DIAGNOSIS — C180 Malignant neoplasm of cecum: Secondary | ICD-10-CM

## 2023-06-14 DIAGNOSIS — J430 Unilateral pulmonary emphysema [MacLeod's syndrome]: Secondary | ICD-10-CM

## 2023-06-29 ENCOUNTER — Encounter (INDEPENDENT_AMBULATORY_CARE_PROVIDER_SITE_OTHER): Payer: Self-pay

## 2023-06-29 ENCOUNTER — Other Ambulatory Visit (HOSPITAL_COMMUNITY): Payer: Self-pay

## 2023-06-29 DIAGNOSIS — Z0130 Encounter for examination of blood pressure without abnormal findings: Secondary | ICD-10-CM | POA: Diagnosis not present

## 2023-06-29 DIAGNOSIS — Z6822 Body mass index (BMI) 22.0-22.9, adult: Secondary | ICD-10-CM | POA: Diagnosis not present

## 2023-06-29 DIAGNOSIS — Z131 Encounter for screening for diabetes mellitus: Secondary | ICD-10-CM | POA: Diagnosis not present

## 2023-06-29 DIAGNOSIS — F17210 Nicotine dependence, cigarettes, uncomplicated: Secondary | ICD-10-CM | POA: Diagnosis not present

## 2023-06-29 DIAGNOSIS — M545 Low back pain, unspecified: Secondary | ICD-10-CM | POA: Diagnosis not present

## 2023-06-29 DIAGNOSIS — Z79899 Other long term (current) drug therapy: Secondary | ICD-10-CM | POA: Diagnosis not present

## 2023-06-29 DIAGNOSIS — Z1159 Encounter for screening for other viral diseases: Secondary | ICD-10-CM | POA: Diagnosis not present

## 2023-06-29 MED ORDER — OXYCODONE-ACETAMINOPHEN 10-325 MG PO TABS
1.0000 | ORAL_TABLET | Freq: Four times a day (QID) | ORAL | 0 refills | Status: DC | PRN
  Filled 2023-06-29: qty 120, 30d supply, fill #0

## 2023-07-13 ENCOUNTER — Other Ambulatory Visit: Payer: Self-pay | Admitting: Hematology & Oncology

## 2023-07-13 DIAGNOSIS — G629 Polyneuropathy, unspecified: Secondary | ICD-10-CM

## 2023-07-13 DIAGNOSIS — C180 Malignant neoplasm of cecum: Secondary | ICD-10-CM

## 2023-07-13 DIAGNOSIS — J430 Unilateral pulmonary emphysema [MacLeod's syndrome]: Secondary | ICD-10-CM

## 2023-07-28 ENCOUNTER — Other Ambulatory Visit (HOSPITAL_COMMUNITY): Payer: Self-pay

## 2023-07-28 ENCOUNTER — Ambulatory Visit: Payer: Medicare Other | Admitting: Hematology & Oncology

## 2023-07-28 ENCOUNTER — Inpatient Hospital Stay: Payer: Medicare Other

## 2023-07-28 DIAGNOSIS — Z23 Encounter for immunization: Secondary | ICD-10-CM | POA: Diagnosis not present

## 2023-07-28 DIAGNOSIS — Z789 Other specified health status: Secondary | ICD-10-CM | POA: Diagnosis not present

## 2023-07-28 DIAGNOSIS — M8589 Other specified disorders of bone density and structure, multiple sites: Secondary | ICD-10-CM | POA: Diagnosis not present

## 2023-07-28 DIAGNOSIS — R030 Elevated blood-pressure reading, without diagnosis of hypertension: Secondary | ICD-10-CM | POA: Diagnosis not present

## 2023-07-28 DIAGNOSIS — R7303 Prediabetes: Secondary | ICD-10-CM | POA: Diagnosis not present

## 2023-07-28 DIAGNOSIS — Z6822 Body mass index (BMI) 22.0-22.9, adult: Secondary | ICD-10-CM | POA: Diagnosis not present

## 2023-07-28 DIAGNOSIS — Z0130 Encounter for examination of blood pressure without abnormal findings: Secondary | ICD-10-CM | POA: Diagnosis not present

## 2023-07-28 MED ORDER — OXYCODONE-ACETAMINOPHEN 10-325 MG PO TABS
1.0000 | ORAL_TABLET | Freq: Four times a day (QID) | ORAL | 0 refills | Status: DC | PRN
  Filled 2023-07-28 – 2023-07-29 (×2): qty 120, 30d supply, fill #0

## 2023-07-29 ENCOUNTER — Other Ambulatory Visit (HOSPITAL_COMMUNITY): Payer: Self-pay

## 2023-08-01 DIAGNOSIS — M8589 Other specified disorders of bone density and structure, multiple sites: Secondary | ICD-10-CM | POA: Diagnosis not present

## 2023-08-08 ENCOUNTER — Ambulatory Visit: Payer: Medicare Other | Admitting: Family

## 2023-08-08 ENCOUNTER — Other Ambulatory Visit (HOSPITAL_BASED_OUTPATIENT_CLINIC_OR_DEPARTMENT_OTHER): Payer: Self-pay

## 2023-08-08 VITALS — BP 129/70 | HR 80 | Temp 98.2°F | Resp 16 | Wt 147.0 lb

## 2023-08-08 DIAGNOSIS — Z1231 Encounter for screening mammogram for malignant neoplasm of breast: Secondary | ICD-10-CM

## 2023-08-08 DIAGNOSIS — R7303 Prediabetes: Secondary | ICD-10-CM | POA: Diagnosis not present

## 2023-08-08 DIAGNOSIS — F32A Depression, unspecified: Secondary | ICD-10-CM

## 2023-08-08 DIAGNOSIS — F419 Anxiety disorder, unspecified: Secondary | ICD-10-CM | POA: Diagnosis not present

## 2023-08-08 DIAGNOSIS — J439 Emphysema, unspecified: Secondary | ICD-10-CM

## 2023-08-08 DIAGNOSIS — E785 Hyperlipidemia, unspecified: Secondary | ICD-10-CM | POA: Diagnosis not present

## 2023-08-08 DIAGNOSIS — L2082 Flexural eczema: Secondary | ICD-10-CM

## 2023-08-08 DIAGNOSIS — Z1211 Encounter for screening for malignant neoplasm of colon: Secondary | ICD-10-CM

## 2023-08-08 MED ORDER — COVID-19 MRNA VAC-TRIS(PFIZER) 30 MCG/0.3ML IM SUSY
0.3000 mL | PREFILLED_SYRINGE | Freq: Once | INTRAMUSCULAR | 0 refills | Status: AC
  Filled 2023-08-08: qty 0.3, 1d supply, fill #0

## 2023-08-08 NOTE — Patient Instructions (Signed)
VISIT SUMMARY:  Dear Teresa Morgan, thank you for coming in for your regular follow-up. We discussed your borderline diabetes, anxiety, asthma, and high cholesterol. We also talked about your general health maintenance, including upcoming screenings.  YOUR PLAN:  -BORDERLINE DIABETES: Your blood sugar levels are slightly higher than normal, which is known as borderline diabetes. We will repeat your A1c test today to monitor your blood sugar levels. Please continue with your diet and exercise modifications to manage this condition.  -ANXIETY/DEPRESSION: You've been experiencing changes in your mood due to external stressors. You should continue taking your current medications, Trintellix and Xanax, as prescribed by Dr. Theodoro Grist.  -ASTHMA: You have not reported any breathing issues. Please continue using your current medications, Advair and Albuterol, as needed.  -HIGH CHOLESTEROL: You have high levels of cholesterol in your blood. We will check your cholesterol levels today. Please continue taking your Atorvastatin medication.  INSTRUCTIONS:  For your general health maintenance, please schedule your colonoscopy with [facility] in June and your mammogram downstairs on September 18th. I also recommend getting a COVID booster at your local pharmacy. Your tetanus shot is due, and we discussed options for getting it outside of the office due to your insurance coverage. We will follow up in 6 months, depending on your A1c results.

## 2023-08-08 NOTE — Assessment & Plan Note (Signed)
Uses xanax HS for sleep.  Continues trintellix, well controlled.

## 2023-08-08 NOTE — Assessment & Plan Note (Addendum)
Lab Results  Component Value Date   HGBA1C 6.3 02/02/2023   HGBA1C 6.0 (H) 10/28/2022   HGBA1C 6.3 06/17/2022   Lab Results  Component Value Date   LDLCALC 54 10/28/2022   CREATININE 0.88 02/22/2023   Update A1C. Continue to work on diet/exercise.

## 2023-08-08 NOTE — Progress Notes (Signed)
Subjective:     Patient ID: Teresa Morgan, female    DOB: 1956/07/11, 67 y.o.   MRN: [id number]  Chief Complaint  Patient presents with   Hyperlipidemia    Here for follow up   Anxiety    Here for follow up of anxiety with depression    Hyperlipidemia  Anxiety      Discussed the use of AI scribe software for clinical note transcription with the patient, who gave verbal consent to proceed.  History of Present Illness   Teresa Morgan, a patient with a history of colon cancer, anxiety, borderline diabetes, and eczema, presents for a regular follow-up. She reports variable mood, which she attributes to personal stressors, including her daughter's current struggles. She is currently on a small dose of Trintellix 5 mg for her mood and takes Xanax for sleep, prescribed by Dr. Myna Hidalgo (her oncologist).  She has been managing her borderline diabetes with diet and exercise, but admits to struggling with dietary changes, particularly reducing her rice intake due to her [place] heritage. Her last A1c in March was 6.3, just under the diabetes threshold of 6.5.  Her eczema has resolved since her last visit. She is currently on atorvastatin 20 for cholesterol management and uses Advair and albuterol for her respiratory health. She recently had four teeth extracted and is due for a colonoscopy and mammogram. She also inquired about a prescription for collagen for skin elasticity.          Health Maintenance Due  Topic Date Due   DTaP/Tdap/Td (2 - Td or Tdap) 03/13/2023   Colonoscopy  04/24/2023   COVID-19 Vaccine (5 - 2023-24 season) 07/16/2023   Medicare Annual Wellness (AWV)  09/09/2023    Past Medical History:  Diagnosis Date   Anemia    Anxiety    Arthritis    Borderline diabetes    Cataract    bilat removed   Colon cancer ([facility]) 2013   cecal cancer   COPD with emphysema ([facility]) 06/18/2012   Depression    GERD (gastroesophageal reflux disease)    Hyperlipidemia 05/27/2021    Neuromuscular disorder (HCC)    neuropathy from chemo   Peptic ulcer    Pneumonia    Shortness of breath dyspnea    with exertion    Past Surgical History:  Procedure Laterality Date   APPENDECTOMY  07/07/11   BREAST EXCISIONAL BIOPSY Right 03/15/2018   COLON RESECTION  2012   for colon cancer   COLONOSCOPY     EYE SURGERY Bilateral    cataract surgery with lens implant   heel spurs Right    hemrrhoids     PLANTAR FASCIA SURGERY Right 2009   PORT-A-CATH REMOVAL Left 04/19/2016   Procedure: REMOVAL PORT-A-CATH;  Surgeon: Almond Lint, MD;  Location: [facility];  Service: General;  Laterality: Left;   portacath     RADIOACTIVE SEED GUIDED EXCISIONAL BREAST BIOPSY Right 03/15/2018   Procedure: RADIOACTIVE SEED GUIDED EXCISIONAL BREAST BIOPSY;  Surgeon: Almond Lint, MD;  Location: [facility];  Service: General;  Laterality: Right;   TUBAL LIGATION  07/1980    Family History  Problem Relation Age of Onset   Diabetes Mother    Colon cancer Cousin    Colon cancer Cousin    Thalassemia Daughter    Colon polyps Neg Hx    Rectal cancer Neg Hx    Stomach cancer Neg Hx     Social History   Socioeconomic History   Marital status:  Married    Spouse name: Not on file   Number of children: 3   Years of education: Not on file   Highest education level: Some college, no degree  Occupational History   Occupation: Marine scientist: [employer]  Tobacco Use   Smoking status: Every Day    Current packs/day: 1.00    Average packs/day: 1 pack/day for 45.0 years (45.0 ttl pk-yrs)    Types: Cigarettes    Start date: 04/23/1986   Smokeless tobacco: Never   Tobacco comments:    Smokes 7 packs of cigarettes a week. 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   Married      Social Determinants of Corporate investment banker Strain: Low Risk  (08/08/2023)   Overall Financial Resource Strain (CARDIA)    Difficulty of Paying Living Expenses: Not very hard  Food Insecurity: No Food Insecurity (08/08/2023)   Hunger Vital Sign    Worried About Running Out of Food in the Last Year: Never true    Ran Out of Food in the Last Year: Never true  Transportation Needs: No Transportation Needs (08/08/2023)   PRAPARE - Administrator, Civil Service (Medical): No    Lack of Transportation (Non-Medical): No  Physical Activity: Insufficiently Active (08/08/2023)   Exercise Vital Sign    Days of Exercise per Week: 1 day    Minutes of Exercise per Session: 30 min  Stress: No Stress Concern Present (08/08/2023)   [employer] of Occupational Health - Occupational Stress Questionnaire    Feeling of Stress : Not at all  Social Connections: Moderately Isolated (08/08/2023)   Social Connection and Isolation Panel [NHANES]    Frequency of Communication with Friends and Family: Once a week    Frequency of Social Gatherings with Friends and Family: Once a week    Attends Religious Services: More than 4 times per year    Active Member of [employer] or Organizations: No    Attends Banker Meetings: Not on file    Marital Status: Married  Catering manager Violence: Not At Risk (09/06/2021)   Humiliation, Afraid, Rape, and Kick questionnaire    Fear of Current or Ex-Partner: No    Emotionally Abused: No    Physically Abused: No    Sexually Abused: No    Outpatient Medications Prior to Visit  Medication Sig Dispense Refill   ALPRAZolam (XANAX) 1 MG tablet TAKE 1 TABLET BY MOUTH EVERY DAY 30 tablet 0   atorvastatin (LIPITOR) 20 MG tablet Take 1 tablet (20 mg total) by mouth daily. 90 tablet 1    betamethasone dipropionate 0.05 % cream Apply topically 2 (two) times daily. 30 g 0   Calcium-Vitamin D 600-125 MG-UNIT TABS Take 1 tablet by mouth daily.     EPINEPHrine (EPIPEN 2-PAK) 0.3 mg/0.3 mL IJ SOAJ injection Inject 0.3 mg into the muscle as needed for anaphylaxis. 2 each 0   fluticasone-salmeterol (ADVAIR) 250-50 MCG/ACT AEPB Inhale 1 puff into the lungs every 12 (twelve) hours. 60 each  11   Multiple Vitamin (MULTIVITAMIN) tablet Take 1 tablet by mouth daily.     nicotine (NICOTROL) 10 MG inhaler Inhale 1 Cartridge (1 continuous puffing total) into the lungs as needed for smoking cessation. 150 each 0   oxyCODONE-acetaminophen (PERCOCET) 10-325 MG tablet Take 1 tablet by mouth 4 (four) times daily as needed. 120 tablet 0   oxyCODONE-acetaminophen (PERCOCET) 10-325 MG tablet Take 1 tablet by mouth 4 (four) times daily as needed. 120 tablet 0   TURMERIC PO Take 1 tablet by mouth daily.     vitamin E 200 UNIT capsule Take 200 Units by mouth daily.     vortioxetine HBr (TRINTELLIX) 5 MG TABS tablet TAKE 1 TABLET(5 MG) BY MOUTH DAILY 90 tablet 0   albuterol (VENTOLIN HFA) 108 (90 Base) MCG/ACT inhaler INHALE 1 - 2 PUFFS INTO THE LUNGS EVERY 6 HOURS AS NEEDED FOR WHEEZING OR SHORTNESS OF BREATH 8.5 g 2   oxyCODONE-acetaminophen (PERCOCET) 10-325 MG tablet Take 1 tablet by mouth 4 (four) times daily as needed. 120 tablet 0   oxyCODONE-acetaminophen (PERCOCET) 10-325 MG tablet Take 1 (one) tablet by mouth four times daily as needed 120 tablet 0   oxyCODONE-acetaminophen (PERCOCET) 10-325 MG tablet Take 1 tablet by mouth 4 (four) times daily as needed. 120 tablet 0   oxyCODONE-acetaminophen (PERCOCET) 10-325 MG tablet Take 1 tablet by mouth 4 times daily, as needed 120 tablet 0   oxyCODONE-acetaminophen (PERCOCET) 10-325 MG tablet Take 1 tablet by mouth 4 (four) times daily as needed 120 tablet 0   No facility-administered medications prior to visit.    Allergies  Allergen Reactions   Aspirin      Upset stomach   Cymbalta [Duloxetine Hcl] Swelling    Swollen lips    Prozac [Fluoxetine Hcl] Swelling    Lip swelling   Wellbutrin [Bupropion] Swelling    Lip swelling    ROS    See HPI  Objective:    Physical Exam Constitutional:      General: She is not in acute distress.    Appearance: Normal appearance. She is well-developed.  HENT:     Head: Normocephalic and atraumatic.     Right Ear: External ear normal.     Left Ear: External ear normal.  Eyes:     General: No scleral icterus. Neck:     Thyroid: No thyromegaly.  Cardiovascular:     Rate and Rhythm: Normal rate and regular rhythm.     Heart sounds: Normal heart sounds. No murmur heard. Pulmonary:     Effort: Pulmonary effort is normal. No respiratory distress.     Breath sounds: Normal breath sounds. No wheezing.  Musculoskeletal:     Cervical back: Neck supple.  Skin:    General: Skin is warm and dry.  Neurological:     Mental Status: She is alert and oriented to person, place, and time.  Psychiatric:        Mood and Affect: Mood normal.        Behavior: Behavior normal.        Thought Content: Thought content normal.        Judgment: Judgment normal.      BP 129/70 (BP Location: Right Arm, Patient Position: Sitting, Cuff Size: Small)   Pulse 80   Temp 98.2 F (36.8 C) (Oral)   Resp 16   Wt 147 lb (66.7 kg)   SpO2 100%   BMI 22.03 kg/m  Wt Readings from Last 3 Encounters:  08/08/23 147  lb (66.7 kg)  02/02/23 156 lb 12.8 oz (71.1 kg)  10/28/22 155 lb 9.6 oz (70.6 kg)       Assessment & Plan:   Problem List Items Addressed This Visit       Unprioritized   Hyperlipidemia    Lab Results  Component Value Date   CHOL 118 10/28/2022   HDL 42 (L) 10/28/2022   LDLCALC 54 10/28/2022   LDLDIRECT 75.0 06/17/2022   TRIG 141 10/28/2022   CHOLHDL 2.8 10/28/2022   Stable on atorvastatin 20mg , continue same, update lipid panel.       Relevant Orders   Lipid panel   RESOLVED: Flexural  eczema    Resolved per patient.       COPD with emphysema, Gold B    Stable on advair and prn albuterol.      Borderline diabetes    Lab Results  Component Value Date   HGBA1C 6.3 02/02/2023   HGBA1C 6.0 (H) 10/28/2022   HGBA1C 6.3 06/17/2022   Lab Results  Component Value Date   LDLCALC 54 10/28/2022   CREATININE 0.88 02/22/2023   Update A1C. Continue to work on diet/exercise.       Relevant Orders   HgB A1c   Comp Met (CMET)   Anxiety and depression - Primary    Uses xanax HS for sleep.  Continues trintellix, well controlled.       Other Visit Diagnoses     Breast cancer screening by mammogram       Relevant Orders   MM 3D SCREENING MAMMOGRAM BILATERAL BREAST   Screening for colon cancer       Relevant Orders   Ambulatory referral to Gastroenterology       I am having Teresa Morgan maintain her multivitamin, vitamin E, TURMERIC PO, Calcium-Vitamin D, EPINEPHrine, albuterol, nicotine, fluticasone-salmeterol, betamethasone dipropionate, atorvastatin, Trintellix, oxyCODONE-acetaminophen, ALPRAZolam, and oxyCODONE-acetaminophen.  No orders of the defined types were placed in this encounter.

## 2023-08-08 NOTE — Assessment & Plan Note (Signed)
Lab Results  Component Value Date   CHOL 118 10/28/2022   HDL 42 (L) 10/28/2022   LDLCALC 54 10/28/2022   LDLDIRECT 75.0 06/17/2022   TRIG 141 10/28/2022   CHOLHDL 2.8 10/28/2022   Stable on atorvastatin 20mg , continue same, update lipid panel.

## 2023-08-08 NOTE — Assessment & Plan Note (Signed)
Resolved per patient.

## 2023-08-08 NOTE — Assessment & Plan Note (Signed)
Stable on advair and prn albuterol.

## 2023-08-09 LAB — COMPREHENSIVE METABOLIC PANEL
ALT: 14 U/L (ref 0–35)
AST: 18 U/L (ref 0–37)
Albumin: 4.1 g/dL (ref 3.5–5.2)
Alkaline Phosphatase: 75 U/L (ref 39–117)
BUN: 8 mg/dL (ref 6–23)
CO2: 29 mEq/L (ref 19–32)
Calcium: 8.8 mg/dL (ref 8.4–10.5)
Chloride: 104 mEq/L (ref 96–112)
Creatinine, Ser: 0.88 mg/dL (ref 0.40–1.20)
GFR: 67.53 mL/min (ref >60.00)
Glucose, Bld: 92 mg/dL (ref 70–99)
Potassium: 4.2 mEq/L (ref 3.5–5.1)
Sodium: 140 mEq/L (ref 135–145)
Total Bilirubin: 0.5 mg/dL (ref 0.2–1.2)
Total Protein: 6.6 g/dL (ref 6.0–8.3)

## 2023-08-09 LAB — LIPID PANEL
Cholesterol: 120 mg/dL (ref 0–200)
HDL: 44.6 mg/dL (ref >39.00)
LDL Cholesterol: 54 mg/dL (ref 0–99)
NonHDL: 75.89
Total CHOL/HDL Ratio: 3
Triglycerides: 111 mg/dL (ref 0.0–149.0)
VLDL: 22.2 mg/dL (ref 0.0–40.0)

## 2023-08-09 LAB — HEMOGLOBIN A1C: Hgb A1c MFr Bld: 6 % (ref 4.6–6.5)

## 2023-08-11 ENCOUNTER — Other Ambulatory Visit: Payer: Self-pay | Admitting: *Deleted

## 2023-08-11 DIAGNOSIS — C180 Malignant neoplasm of cecum: Secondary | ICD-10-CM

## 2023-08-14 ENCOUNTER — Ambulatory Visit (HOSPITAL_BASED_OUTPATIENT_CLINIC_OR_DEPARTMENT_OTHER)
Admission: RE | Admit: 2023-08-14 | Discharge: 2023-08-14 | Disposition: A | Discharge status: Home / Self Care | Payer: Medicare Other | Source: Ambulatory Visit | Attending: Family | Admitting: Family

## 2023-08-14 ENCOUNTER — Other Ambulatory Visit: Payer: Self-pay | Admitting: Hematology & Oncology

## 2023-08-14 ENCOUNTER — Inpatient Hospital Stay: Payer: Medicare Other | Attending: Hematology & Oncology

## 2023-08-14 ENCOUNTER — Inpatient Hospital Stay (HOSPITAL_BASED_OUTPATIENT_CLINIC_OR_DEPARTMENT_OTHER): Payer: Medicare Other | Admitting: Hematology & Oncology

## 2023-08-14 ENCOUNTER — Encounter: Payer: Self-pay | Admitting: Hematology & Oncology

## 2023-08-14 ENCOUNTER — Other Ambulatory Visit: Payer: Self-pay

## 2023-08-14 ENCOUNTER — Encounter (HOSPITAL_BASED_OUTPATIENT_CLINIC_OR_DEPARTMENT_OTHER): Payer: Self-pay

## 2023-08-14 VITALS — BP 139/67 | HR 77 | Temp 98.2°F | Resp 16 | Wt 145.0 lb

## 2023-08-14 DIAGNOSIS — T451X5A Adverse effect of antineoplastic and immunosuppressive drugs, initial encounter: Secondary | ICD-10-CM | POA: Insufficient documentation

## 2023-08-14 DIAGNOSIS — Z1231 Encounter for screening mammogram for malignant neoplasm of breast: Secondary | ICD-10-CM | POA: Diagnosis not present

## 2023-08-14 DIAGNOSIS — C180 Malignant neoplasm of cecum: Secondary | ICD-10-CM

## 2023-08-14 DIAGNOSIS — Z7951 Long term (current) use of inhaled steroids: Secondary | ICD-10-CM | POA: Insufficient documentation

## 2023-08-14 DIAGNOSIS — G620 Drug-induced polyneuropathy: Secondary | ICD-10-CM | POA: Insufficient documentation

## 2023-08-14 DIAGNOSIS — Z85038 Personal history of other malignant neoplasm of large intestine: Secondary | ICD-10-CM | POA: Insufficient documentation

## 2023-08-14 DIAGNOSIS — Z79899 Other long term (current) drug therapy: Secondary | ICD-10-CM | POA: Insufficient documentation

## 2023-08-14 DIAGNOSIS — G629 Polyneuropathy, unspecified: Secondary | ICD-10-CM

## 2023-08-14 DIAGNOSIS — Z9221 Personal history of antineoplastic chemotherapy: Secondary | ICD-10-CM | POA: Insufficient documentation

## 2023-08-14 DIAGNOSIS — J430 Unilateral pulmonary emphysema [MacLeod's syndrome]: Secondary | ICD-10-CM

## 2023-08-14 LAB — CMP (CANCER CENTER ONLY)
ALT: 16 U/L (ref 0–44)
AST: 18 U/L (ref 15–41)
Albumin: 4.3 g/dL (ref 3.5–5.0)
Alkaline Phosphatase: 60 U/L (ref 38–126)
Anion gap: 8 (ref 5–15)
BUN: 8 mg/dL (ref 8–23)
CO2: 28 mmol/L (ref 22–32)
Calcium: 9 mg/dL (ref 8.9–10.3)
Chloride: 104 mmol/L (ref 98–111)
Creatinine: 0.88 mg/dL (ref 0.44–1.00)
GFR, Estimated: >60 mL/min (ref >60)
Glucose, Bld: 91 mg/dL (ref 70–99)
Potassium: 4 mmol/L (ref 3.5–5.1)
Sodium: 140 mmol/L (ref 135–145)
Total Bilirubin: 0.5 mg/dL (ref 0.3–1.2)
Total Protein: 7.5 g/dL (ref 6.5–8.1)

## 2023-08-14 LAB — CBC WITH DIFFERENTIAL (CANCER CENTER ONLY)
Abs Immature Granulocytes: 0.02 10*3/uL (ref 0.00–0.07)
Basophils Absolute: 0 10*3/uL (ref 0.0–0.1)
Basophils Relative: 0 %
Eosinophils Absolute: 0.1 10*3/uL (ref 0.0–0.5)
Eosinophils Relative: 1 %
HCT: 42 % (ref 36.0–46.0)
Hemoglobin: 13.4 g/dL (ref 12.0–15.0)
Immature Granulocytes: 0 %
Lymphocytes Relative: 32 %
Lymphs Abs: 2.6 10*3/uL (ref 0.7–4.0)
MCH: 23.4 pg — ABNORMAL LOW (ref 26.0–34.0)
MCHC: 31.9 g/dL (ref 30.0–36.0)
MCV: 73.3 fL — ABNORMAL LOW (ref 80.0–100.0)
Monocytes Absolute: 0.4 10*3/uL (ref 0.1–1.0)
Monocytes Relative: 5 %
Neutro Abs: 4.8 10*3/uL (ref 1.7–7.7)
Neutrophils Relative %: 62 %
Platelet Count: 173 10*3/uL (ref 150–400)
RBC: 5.73 MIL/uL — ABNORMAL HIGH (ref 3.87–5.11)
RDW: 15.3 % (ref 11.5–15.5)
WBC Count: 7.9 10*3/uL (ref 4.0–10.5)
nRBC: 0 % (ref 0.0–0.2)

## 2023-08-14 LAB — LACTATE DEHYDROGENASE: LDH: 200 U/L — ABNORMAL HIGH (ref 98–192)

## 2023-08-14 LAB — CEA (ACCESS): CEA (CHCC): 5.02 ng/mL — ABNORMAL HIGH (ref 0.00–5.00)

## 2023-08-14 NOTE — Progress Notes (Signed)
Hematology and Oncology Follow Up Visit  MAYLEN HAMMAN [id number] 1956-02-28 67 y.o. 08/14/2023   Principle Diagnosis:  1. Stage IIIB (T2 N2 M0) adenocarcinoma of the cecum. 2. Neuropathy secondary to chemotherapy.  Current Therapy:   Observation     Interim History:  Ms.  Paller is back for followup.  We see her yearly.  She is still smoking but trying to cut back.  Apparently, she should be due for a another chest x-ray or CT scan next month.  She had a mammogram that was done today.  She has had no problems with cough or shortness of breath.  She has had no issues with COVID.  There is been no problems with leg swelling.  She has had no bleeding.  There has been no obvious change in bowel or bladder habits.  She has had no issues with headache..  She recently had teeth taken out.  She got had partials put in.  She may have lost a little weight because she has not been able to eat.  Of note, her last CEA level was 6.7 back in September of last year.  She is supposed to have another colonoscopy.  I will have to get her set up for this.  Currently, I would say that her performance status about ECOG 1.   Medications:  Current Outpatient Medications:    albuterol (VENTOLIN HFA) 108 (90 Base) MCG/ACT inhaler, INHALE 1 - 2 PUFFS INTO THE LUNGS EVERY 6 HOURS AS NEEDED FOR WHEEZING OR SHORTNESS OF BREATH, Disp: 8.5 g, Rfl: 2   ALPRAZolam (XANAX) 1 MG tablet, TAKE 1 TABLET BY MOUTH EVERY DAY, Disp: 30 tablet, Rfl: 0   atorvastatin (LIPITOR) 20 MG tablet, Take 1 tablet (20 mg total) by mouth daily., Disp: 90 tablet, Rfl: 1   betamethasone dipropionate 0.05 % cream, Apply topically 2 (two) times daily., Disp: 30 g, Rfl: 0   Calcium-Vitamin D 600-125 MG-UNIT TABS, Take 1 tablet by mouth daily., Disp: , Rfl:    EPINEPHrine (EPIPEN 2-PAK) 0.3 mg/0.3 mL IJ SOAJ injection, Inject 0.3 mg into the muscle as needed for anaphylaxis., Disp: 2 each, Rfl: 0   fluticasone-salmeterol (ADVAIR) 250-50  MCG/ACT AEPB, Inhale 1 puff into the lungs every 12 (twelve) hours., Disp: 60 each, Rfl: 11   Multiple Vitamin (MULTIVITAMIN) tablet, Take 1 tablet by mouth daily., Disp: , Rfl:    nicotine (NICOTROL) 10 MG inhaler, Inhale 1 Cartridge (1 continuous puffing total) into the lungs as needed for smoking cessation., Disp: 150 each, Rfl: 0   oxyCODONE-acetaminophen (PERCOCET) 10-325 MG tablet, Take 1 tablet by mouth 4 (four) times daily as needed., Disp: 120 tablet, Rfl: 0   oxyCODONE-acetaminophen (PERCOCET) 10-325 MG tablet, Take 1 tablet by mouth 4 (four) times daily as needed., Disp: 120 tablet, Rfl: 0   TURMERIC PO, Take 1 tablet by mouth daily., Disp: , Rfl:    vitamin E 200 UNIT capsule, Take 200 Units by mouth daily., Disp: , Rfl:    vortioxetine HBr (TRINTELLIX) 5 MG TABS tablet, TAKE 1 TABLET(5 MG) BY MOUTH DAILY, Disp: 90 tablet, Rfl: 0  Allergies:  Allergies  Allergen Reactions   Aspirin     Upset stomach   Cymbalta [Duloxetine Hcl] Swelling    Swollen lips    Prozac [Fluoxetine Hcl] Swelling    Lip swelling   Wellbutrin [Bupropion] Swelling    Lip swelling    Past Medical History, Surgical history, Social history, and Family History were reviewed and updated.  Review of Systems: Review of Systems  Constitutional: Negative.   HENT: Negative.    Eyes: Negative.   Respiratory: Negative.    Cardiovascular: Negative.   Gastrointestinal:  Positive for nausea.  Genitourinary: Negative.   Musculoskeletal: Negative.   Skin: Negative.   Neurological:  Positive for tingling.  Endo/Heme/Allergies: Negative.   Psychiatric/Behavioral: Negative.      Physical Exam:  weight is 145 lb (65.8 kg). Her oral temperature is 98.2 F (36.8 C). Her blood pressure is 139/67 and her pulse is 77. Her respiration is 16 and oxygen saturation is 99%.   Physical Exam Vitals reviewed.  HENT:     Head: Normocephalic and atraumatic.  Eyes:     Pupils: Pupils are equal, round, and reactive to  light.  Cardiovascular:     Rate and Rhythm: Normal rate and regular rhythm.     Heart sounds: Normal heart sounds.  Pulmonary:     Effort: Pulmonary effort is normal.     Breath sounds: Normal breath sounds.  Abdominal:     General: Bowel sounds are normal.     Palpations: Abdomen is soft.  Musculoskeletal:        General: No tenderness or deformity. Normal range of motion.     Cervical back: Normal range of motion.  Lymphadenopathy:     Cervical: No cervical adenopathy.  Skin:    General: Skin is warm and dry.     Findings: No erythema or rash.  Neurological:     Mental Status: She is alert and oriented to person, place, and time.  Psychiatric:        Behavior: Behavior normal.        Thought Content: Thought content normal.        Judgment: Judgment normal.     Lab Results  Component Value Date   WBC 7.9 08/14/2023   HGB 13.4 08/14/2023   HCT 42.0 08/14/2023   MCV 73.3 (L) 08/14/2023   PLT 173 08/14/2023     Chemistry      Component Value Date/Time   NA 140 08/08/2023 1327   NA 141 07/24/2017 1147   NA 142 09/21/2015 1306   K 4.2 08/08/2023 1327   K 3.3 07/24/2017 1147   K 3.8 09/21/2015 1306   CL 104 08/08/2023 1327   CL 103 07/24/2017 1147   CO2 29 08/08/2023 1327   CO2 29 07/24/2017 1147   CO2 27 09/21/2015 1306   BUN 8 08/08/2023 1327   BUN 8 07/24/2017 1147   BUN 9.8 09/21/2015 1306   CREATININE 0.88 08/08/2023 1327   CREATININE 1.03 10/28/2022 1342   CREATININE 0.8 09/21/2015 1306      Component Value Date/Time   CALCIUM 8.8 08/08/2023 1327   CALCIUM 9.0 07/24/2017 1147   CALCIUM 9.2 09/21/2015 1306   ALKPHOS 75 08/08/2023 1327   ALKPHOS 81 07/24/2017 1147   ALKPHOS 77 09/21/2015 1306   AST 18 08/08/2023 1327   AST 16 07/23/2021 1124   AST 15 09/21/2015 1306   ALT 14 08/08/2023 1327   ALT 16 07/23/2021 1124   ALT 20 07/24/2017 1147   ALT 13 09/21/2015 1306   BILITOT 0.5 08/08/2023 1327   BILITOT 0.5 07/23/2021 1124   BILITOT 0.40  09/21/2015 1306      Impression and Plan: Ms. Hollon is 67 year-old female with stage IIIB colon cancer. This was of the cecum. She underwent resection back in September of 2012. She had 4 positive lymph nodes. She completed  chemotherapy in March of 2013.  I doubt that colon cancer recurrence will ever be a problem for her.  With her smoking, one has to worry about the possibility of lung cancer.  Again, she is going have a CT scan of her chest x-ray set up and I think in October.  We will have to see about the colonoscopy.  I will speak with Dr. Barron Alvine.  We will now get her back in 1 year    Josph Macho, MD 9/30/20243:14 PM

## 2023-08-17 ENCOUNTER — Telehealth: Payer: Self-pay | Admitting: *Deleted

## 2023-08-17 NOTE — Telephone Encounter (Signed)
Left message for patient to call back. Patient needs direct colonoscopy with Dr Barron Alvine as well as previsit. Hx of colon cancer.

## 2023-08-17 NOTE — Telephone Encounter (Signed)
-----   Message from Shellia Cleverly sent at 08/17/2023  9:36 AM EDT ----- Received a call from Dr. Myna Hidalgo on this patient.  She is in need of repeat colonoscopy for ongoing surveillance.  History of colon cancer diagnosed in 2012, treated with right hemicolectomy and chemotherapy.  Last colonoscopy was by Dr. Christella Hartigan in 04/2018 and normal ileocolonic anastomosis, left-sided diverticulosis, with recommended repeat in 5 years.    Ok to set up for direct access colonoscopy with me in the [facility] for ongoing surveillance.

## 2023-08-18 ENCOUNTER — Other Ambulatory Visit: Payer: Self-pay | Admitting: Family

## 2023-08-18 NOTE — Telephone Encounter (Signed)
Left message for patient to call back  

## 2023-08-21 DIAGNOSIS — J441 Chronic obstructive pulmonary disease with (acute) exacerbation: Secondary | ICD-10-CM | POA: Diagnosis not present

## 2023-08-21 DIAGNOSIS — R519 Headache, unspecified: Secondary | ICD-10-CM | POA: Diagnosis not present

## 2023-08-21 DIAGNOSIS — J019 Acute sinusitis, unspecified: Secondary | ICD-10-CM | POA: Diagnosis not present

## 2023-08-21 DIAGNOSIS — R509 Fever, unspecified: Secondary | ICD-10-CM | POA: Diagnosis not present

## 2023-08-21 NOTE — Telephone Encounter (Signed)
Left additional message for patient to call back. Will also send mychart message.

## 2023-08-25 ENCOUNTER — Other Ambulatory Visit (HOSPITAL_COMMUNITY): Payer: Self-pay

## 2023-08-25 DIAGNOSIS — F17210 Nicotine dependence, cigarettes, uncomplicated: Secondary | ICD-10-CM | POA: Diagnosis not present

## 2023-08-25 DIAGNOSIS — R7303 Prediabetes: Secondary | ICD-10-CM | POA: Diagnosis not present

## 2023-08-25 DIAGNOSIS — Z0130 Encounter for examination of blood pressure without abnormal findings: Secondary | ICD-10-CM | POA: Diagnosis not present

## 2023-08-25 DIAGNOSIS — Z6822 Body mass index (BMI) 22.0-22.9, adult: Secondary | ICD-10-CM | POA: Diagnosis not present

## 2023-08-25 DIAGNOSIS — Z789 Other specified health status: Secondary | ICD-10-CM | POA: Diagnosis not present

## 2023-08-25 DIAGNOSIS — M8589 Other specified disorders of bone density and structure, multiple sites: Secondary | ICD-10-CM | POA: Diagnosis not present

## 2023-08-25 MED ORDER — OXYCODONE-ACETAMINOPHEN 10-325 MG PO TABS
1.0000 | ORAL_TABLET | Freq: Four times a day (QID) | ORAL | 0 refills | Status: DC | PRN
  Filled 2023-08-25 – 2023-08-28 (×2): qty 120, 30d supply, fill #0

## 2023-08-28 ENCOUNTER — Other Ambulatory Visit: Payer: Self-pay

## 2023-08-28 ENCOUNTER — Other Ambulatory Visit (HOSPITAL_COMMUNITY): Payer: Self-pay

## 2023-08-31 DIAGNOSIS — Z79899 Other long term (current) drug therapy: Secondary | ICD-10-CM | POA: Diagnosis not present

## 2023-09-11 ENCOUNTER — Other Ambulatory Visit: Payer: Self-pay | Admitting: Hematology & Oncology

## 2023-09-11 ENCOUNTER — Encounter: Payer: Self-pay | Admitting: Gastroenterology

## 2023-09-11 ENCOUNTER — Ambulatory Visit (INDEPENDENT_AMBULATORY_CARE_PROVIDER_SITE_OTHER): Payer: Medicare Other | Admitting: Adult Health

## 2023-09-11 ENCOUNTER — Other Ambulatory Visit: Payer: Self-pay | Admitting: Family

## 2023-09-11 ENCOUNTER — Encounter: Payer: Self-pay | Admitting: Adult Health

## 2023-09-11 VITALS — BP 130/80 | HR 88 | Temp 98.1°F | Ht 68.0 in | Wt 144.2 lb

## 2023-09-11 DIAGNOSIS — J439 Emphysema, unspecified: Secondary | ICD-10-CM

## 2023-09-11 DIAGNOSIS — J430 Unilateral pulmonary emphysema [MacLeod's syndrome]: Secondary | ICD-10-CM

## 2023-09-11 DIAGNOSIS — G629 Polyneuropathy, unspecified: Secondary | ICD-10-CM

## 2023-09-11 DIAGNOSIS — J309 Allergic rhinitis, unspecified: Secondary | ICD-10-CM | POA: Diagnosis not present

## 2023-09-11 DIAGNOSIS — C180 Malignant neoplasm of cecum: Secondary | ICD-10-CM

## 2023-09-11 MED ORDER — FLUTICASONE-SALMETEROL 250-50 MCG/ACT IN AEPB: 1.0000 | INHALATION_SPRAY | Freq: Two times a day (BID) | RESPIRATORY_TRACT | 11 refills | Status: DC

## 2023-09-11 NOTE — Assessment & Plan Note (Signed)
Claritin as needed 

## 2023-09-11 NOTE — Assessment & Plan Note (Signed)
Well-controlled on Advair twice daily. Continue with the lung cancer CT screening program.  Smoking cessation discussed in detail-congratulated on quitting smoking 1 week ago.  Plan  Patient Instructions  Continue on Advair 1 puff twice daily,  rinse after use Activity as tolerated Albuterol inhaler as needed Claritin 10mg  daily As needed  Drainage  Saline nasal rinses As needed   Great job no not smoking .  Follow-up in 1 year with Dr. Vassie Loll or Mariaelena Cade NP  and as needed

## 2023-09-11 NOTE — Progress Notes (Signed)
@Patient  ID: Teresa Morgan, female    DOB: [birth year], 67 y.o.   MRN: [id number]  Chief Complaint  Patient presents with   Follow-up    Referring provider: Sandford Craze, NP  HPI: 67 year old female former smoker quit October 2024 followed for moderate COPD with emphysema Medical history significant for stage IIIb colon cancer status postresection September 2012 and chemotherapy. Participates in the lung cancer CT chest screening program  TEST/EVENTS :  2019 Spirometry  shows mild airway obstruction with ratio 69 and FEV1 77%  07/2022 PFTs today show mild to moderate airflow obstruction and decreased diffusing capacity with an FEV1 at 73%, 6 ratio 65, FVC 86%. No significant bronchodilator response, DLCO is 51%.    CT chest in 2015 with tiny nodules .  CT chest in 01/2016 with stable right lung nodules , additional nodules improved or resolved. LDCT 08/2019 RADS-2 , tiny nodules 6mm on RT   Low-dose CT September 15, 2020 lung RADS 3 probable benign findings, emphysema previous lung nodules are unchanged measuring 5.7 mm.  New nodule right upper lobe 5.4 mm   CT chest/A/P 07/2021 >> stable nodules  09/11/2023 Follow up : COPD  Patient presents for 1 year follow-up.  Patient has moderate COPD with emphysema.  She remains on Advair twice daily.  Patient says overall breathing is doing about the same.  She denies any flare of cough or wheezing.  Does get winded with some heavy activities.  But still is able to do some light yard work. Patient has recently quit smoking 1 week ago.  She is congratulated on smoking cessation.  She participates in the lung cancer CT screening program.  CT chest November 17, 2022 showed a lung RADS 2 with moderate emphysema.  Mucoid impaction in the right middle lobe and lingula.  Scattered calcified and noncalcified pulmonary nodules largest measuring 6.5 mm stable.  Serial CT is planned for January 2025 with a screening program.  Allergies  Allergen  Reactions   Aspirin     Upset stomach   Cymbalta [Duloxetine Hcl] Swelling    Swollen lips    Prozac [Fluoxetine Hcl] Swelling    Lip swelling   Wellbutrin [Bupropion] Swelling    Lip swelling    Immunization History  Administered Date(s) Administered   Fluad Quad(high Dose 65+) 07/29/2022, 07/26/2023   Influenza Split 11/14/2012, 08/16/2021   Influenza, High Dose Seasonal PF 08/24/2020   Influenza, Seasonal, Injecte, Preservative Fre 11/12/2014   Influenza,inj,Quad PF,6+ Mos 07/24/2017, 10/22/2018   Influenza-Unspecified 09/13/2013, 07/31/2015, 08/04/2016   Moderna SARS-COV2 Booster Vaccination 03/30/2021   Moderna Sars-Covid-2 Vaccination 12/28/2019, 01/18/2020, 10/05/2020   PNEUMOCOCCAL CONJUGATE-20 06/17/2022   Pfizer(Comirnaty)Fall Seasonal Vaccine 12 years and older 09/05/2022, 08/08/2023   Pneumococcal Polysaccharide-23 03/12/2013, 05/20/2020   Respiratory Syncytial Virus Vaccine,Recomb Aduvanted(Arexvy) 09/05/2022   Tdap 03/12/2013   Zoster Recombinant(Shingrix) 10/22/2018, 05/07/2019    Past Medical History:  Diagnosis Date   Anemia    Anxiety    Arthritis    Borderline diabetes    Cataract    bilat removed   Colon cancer (HCC) 2013   cecal cancer   COPD with emphysema (HCC) 06/18/2012   Depression    GERD (gastroesophageal reflux disease)    Hyperlipidemia 05/27/2021   Neuromuscular disorder (HCC)    neuropathy from chemo   Peptic ulcer    Pneumonia    Shortness of breath dyspnea    with exertion    Tobacco History: Social History   Tobacco Use  Smoking Status  Former   Current packs/day: 0.00   Average packs/day: 1 pack/day for 45.0 years (45.0 ttl pk-yrs)   Types: Cigarettes   Start date: 04/23/1986   Quit date: 09/04/2023   Years since quitting: 0.0  Smokeless Tobacco Never  Tobacco Comments   Quit smoking 1 week ago.  09/11/2023 hfb   Counseling given: Not Answered Tobacco comments: Quit smoking 1 week ago.  09/11/2023 hfb   Outpatient  Medications Prior to Visit  Medication Sig Dispense Refill   atorvastatin (LIPITOR) 20 MG tablet Take 1 tablet (20 mg total) by mouth daily. 90 tablet 1   betamethasone dipropionate 0.05 % cream Apply topically 2 (two) times daily. 30 g 0   Calcium-Vitamin D 600-125 MG-UNIT TABS Take 1 tablet by mouth daily.     EPINEPHrine (EPIPEN 2-PAK) 0.3 mg/0.3 mL IJ SOAJ injection Inject 0.3 mg into the muscle as needed for anaphylaxis. 2 each 0   Multiple Vitamin (MULTIVITAMIN) tablet Take 1 tablet by mouth daily.     nicotine (NICOTROL) 10 MG inhaler Inhale 1 Cartridge (1 continuous puffing total) into the lungs as needed for smoking cessation. 150 each 0   oxyCODONE-acetaminophen (PERCOCET) 10-325 MG tablet Take 1 tablet by mouth 4 (four) times daily as needed. 120 tablet 0   TRINTELLIX 5 MG TABS tablet TAKE 1 TABLET(5 MG) BY MOUTH DAILY 90 tablet 0   TURMERIC PO Take 1 tablet by mouth daily.     vitamin E 200 UNIT capsule Take 200 Units by mouth daily.     ALPRAZolam (XANAX) 1 MG tablet TAKE 1 TABLET BY MOUTH EVERY DAY 30 tablet 0   fluticasone-salmeterol (ADVAIR) 250-50 MCG/ACT AEPB Inhale 1 puff into the lungs every 12 (twelve) hours. 60 each 11   albuterol (VENTOLIN HFA) 108 (90 Base) MCG/ACT inhaler INHALE 1 - 2 PUFFS INTO THE LUNGS EVERY 6 HOURS AS NEEDED FOR WHEEZING OR SHORTNESS OF BREATH 8.5 g 2   oxyCODONE-acetaminophen (PERCOCET) 10-325 MG tablet Take 1 tablet by mouth 4 (four) times daily as needed. (Patient not taking: Reported on 09/11/2023) 120 tablet 0   oxyCODONE-acetaminophen (PERCOCET) 10-325 MG tablet Take 1 tablet by mouth 4 (four) times daily as needed. (Patient not taking: Reported on 09/11/2023) 120 tablet 0   No facility-administered medications prior to visit.     Review of Systems:   Constitutional:   No  weight loss, night sweats,  Fevers, chills, fatigue, or  lassitude.  HEENT:   No headaches,  Difficulty swallowing,  Tooth/dental problems, or  Sore throat,                 No sneezing, itching, ear ache, nasal congestion, post nasal drip,   CV:  No chest pain,  Orthopnea, PND, swelling in lower extremities, anasarca, dizziness, palpitations, syncope.   GI  No heartburn, indigestion, abdominal pain, nausea, vomiting, diarrhea, change in bowel habits, loss of appetite, bloody stools.   Resp: No shortness of breath with exertion or at rest.  No excess mucus, no productive cough,  No non-productive cough,  No coughing up of blood.  No change in color of mucus.  No wheezing.  No chest wall deformity  Skin: no rash or lesions.  GU: no dysuria, change in color of urine, no urgency or frequency.  No flank pain, no hematuria   MS:  No joint pain or swelling.  No decreased range of motion.  No back pain.    Physical Exam  BP 130/80 (BP Location: Left Arm, Patient  Position: Sitting, Cuff Size: Normal)   Pulse 88   Temp 98.1 F (36.7 C) (Oral)   Ht 5\' 8"  (1.727 m)   Wt 144 lb 3.2 oz (65.4 kg)   SpO2 99%   BMI 21.93 kg/m   GEN: A/Ox3; pleasant , NAD, well nourished    HEENT:  [place]/AT,  NOSE-clear, THROAT-clear, no lesions, no postnasal drip or exudate noted.   NECK:  Supple w/ fair ROM; no JVD; normal carotid impulses w/o bruits; no thyromegaly or nodules palpated; no lymphadenopathy.    RESP  Clear  P & A; w/o, wheezes/ rales/ or rhonchi. no accessory muscle use, no dullness to percussion  CARD:  RRR, no m/r/g, no peripheral edema, pulses intact, no cyanosis or clubbing.  GI:   Soft & nt; nml bowel sounds; no organomegaly or masses detected.   Musco: Warm bil, no deformities or joint swelling noted.   Neuro: alert, no focal deficits noted.    Skin: Warm, no lesions or rashes    Lab Results:  CBC   BMET   BNP No results found for: "BNP"  ProBNP No results found for: "PROBNP"  Imaging:   Administration History     None          Latest Ref Rng & Units 08/02/2022    1:37 PM 12/27/2017   10:52 AM  PFT Results  FVC-Pre L 3.15   3.21  P  FVC-Predicted Pre % 87  85  P  FVC-Post L 3.11    FVC-Predicted Post % 86    Pre FEV1/FVC % % 64  69  P  Post FEV1/FCV % % 65    FEV1-Pre L 2.01  2.23  P  FEV1-Predicted Pre % 73  77  P  FEV1-Post L 2.02    DLCO uncorrected ml/min/mmHg 11.62    DLCO UNC% % 51    DLCO corrected ml/min/mmHg 11.39    DLCO COR %Predicted % 50    DLVA Predicted % 55    TLC L 5.82    TLC % Predicted % 102    RV % Predicted % 117      P Preliminary result    No results found for: "NITRICOXIDE"      Assessment & Plan:   COPD with emphysema, Gold B Well-controlled on Advair twice daily. Continue with the lung cancer CT screening program.  Smoking cessation discussed in detail-congratulated on quitting smoking 1 week ago.  Plan  Patient Instructions  Continue on Advair 1 puff twice daily,  rinse after use Activity as tolerated Albuterol inhaler as needed Claritin 10mg  daily As needed  Drainage  Saline nasal rinses As needed   Great job no not smoking .  Follow-up in 1 year with Dr. Vassie Loll or Heidie Krall NP  and as needed    Allergic rhinitis Claritin as needed     Rubye Oaks, NP 09/11/2023

## 2023-09-11 NOTE — Patient Instructions (Addendum)
Continue on Advair 1 puff twice daily,  rinse after use Activity as tolerated Albuterol inhaler as needed Claritin 10mg  daily As needed  Drainage  Saline nasal rinses As needed   Great job no not smoking .  Follow-up in 1 year with Dr. Vassie Loll or Lauri Purdum NP  and as needed

## 2023-09-19 ENCOUNTER — Other Ambulatory Visit: Payer: Self-pay | Admitting: Family

## 2023-09-19 ENCOUNTER — Other Ambulatory Visit: Payer: Self-pay | Admitting: Pulmonary Disease

## 2023-09-20 ENCOUNTER — Other Ambulatory Visit (HOSPITAL_COMMUNITY): Payer: Self-pay

## 2023-09-20 DIAGNOSIS — F17210 Nicotine dependence, cigarettes, uncomplicated: Secondary | ICD-10-CM | POA: Diagnosis not present

## 2023-09-20 DIAGNOSIS — Z6821 Body mass index (BMI) 21.0-21.9, adult: Secondary | ICD-10-CM | POA: Diagnosis not present

## 2023-09-20 DIAGNOSIS — Z789 Other specified health status: Secondary | ICD-10-CM | POA: Diagnosis not present

## 2023-09-20 DIAGNOSIS — R7303 Prediabetes: Secondary | ICD-10-CM | POA: Diagnosis not present

## 2023-09-20 DIAGNOSIS — Z0130 Encounter for examination of blood pressure without abnormal findings: Secondary | ICD-10-CM | POA: Diagnosis not present

## 2023-09-20 DIAGNOSIS — Z79899 Other long term (current) drug therapy: Secondary | ICD-10-CM | POA: Diagnosis not present

## 2023-09-20 MED ORDER — OXYCODONE-ACETAMINOPHEN 10-325 MG PO TABS
1.0000 | ORAL_TABLET | Freq: Four times a day (QID) | ORAL | 0 refills | Status: AC | PRN
  Filled 2023-09-20 – 2023-09-27 (×2): qty 120, 30d supply, fill #0

## 2023-09-26 ENCOUNTER — Other Ambulatory Visit (HOSPITAL_COMMUNITY): Payer: Self-pay

## 2023-09-27 ENCOUNTER — Other Ambulatory Visit (HOSPITAL_COMMUNITY): Payer: Self-pay

## 2023-10-13 ENCOUNTER — Other Ambulatory Visit: Payer: Self-pay | Admitting: Hematology & Oncology

## 2023-10-13 DIAGNOSIS — C180 Malignant neoplasm of cecum: Secondary | ICD-10-CM

## 2023-10-13 DIAGNOSIS — G629 Polyneuropathy, unspecified: Secondary | ICD-10-CM

## 2023-10-13 DIAGNOSIS — J430 Unilateral pulmonary emphysema [MacLeod's syndrome]: Secondary | ICD-10-CM

## 2023-10-16 ENCOUNTER — Other Ambulatory Visit (HOSPITAL_BASED_OUTPATIENT_CLINIC_OR_DEPARTMENT_OTHER): Payer: Self-pay

## 2023-10-16 ENCOUNTER — Ambulatory Visit (AMBULATORY_SURGERY_CENTER): Payer: Medicare Other

## 2023-10-16 VITALS — Ht 68.0 in | Wt 148.0 lb

## 2023-10-16 DIAGNOSIS — Z85038 Personal history of other malignant neoplasm of large intestine: Secondary | ICD-10-CM

## 2023-10-16 DIAGNOSIS — Z860100 Personal history of colon polyps, unspecified: Secondary | ICD-10-CM

## 2023-10-16 DIAGNOSIS — Z800 Family history of malignant neoplasm of digestive organs: Secondary | ICD-10-CM

## 2023-10-16 MED ORDER — PEG 3350-KCL-NA BICARB-NACL 420 G PO SOLR
4000.0000 mL | Freq: Once | ORAL | 0 refills | Status: AC
  Filled 2023-10-16: qty 4000, 1d supply, fill #0

## 2023-10-16 NOTE — Progress Notes (Signed)
No egg or soy allergy known to patient  No issues known to pt with past sedation with any surgeries or procedures Patient denies ever being told they had issues or difficulty with intubation  No FH of Malignant Hyperthermia Pt is not on diet pills Pt is not on  home 02  Pt is not on blood thinners  Pt denies issues with constipation  No A fib or A flutter Have any cardiac testing pending--no  LOA: independent  Prep: golytely   Patient's chart reviewed by Cathlyn Parsons CNRA prior to previsit and patient appropriate for the [facility].  Previsit completed and red dot placed by patient's name on their procedure day (on provider's schedule).     PV competed with patient. Prep instructions sent via mychart and home address.

## 2023-10-18 ENCOUNTER — Other Ambulatory Visit (HOSPITAL_COMMUNITY): Payer: Self-pay

## 2023-10-18 DIAGNOSIS — Z79899 Other long term (current) drug therapy: Secondary | ICD-10-CM | POA: Diagnosis not present

## 2023-10-18 DIAGNOSIS — F17210 Nicotine dependence, cigarettes, uncomplicated: Secondary | ICD-10-CM | POA: Diagnosis not present

## 2023-10-18 DIAGNOSIS — R030 Elevated blood-pressure reading, without diagnosis of hypertension: Secondary | ICD-10-CM | POA: Diagnosis not present

## 2023-10-18 DIAGNOSIS — Z789 Other specified health status: Secondary | ICD-10-CM | POA: Diagnosis not present

## 2023-10-18 DIAGNOSIS — R7303 Prediabetes: Secondary | ICD-10-CM | POA: Diagnosis not present

## 2023-10-18 DIAGNOSIS — Z0130 Encounter for examination of blood pressure without abnormal findings: Secondary | ICD-10-CM | POA: Diagnosis not present

## 2023-10-18 DIAGNOSIS — Z6821 Body mass index (BMI) 21.0-21.9, adult: Secondary | ICD-10-CM | POA: Diagnosis not present

## 2023-10-18 MED ORDER — OXYCODONE-ACETAMINOPHEN 10-325 MG PO TABS: 1.0000 | ORAL_TABLET | Freq: Four times a day (QID) | ORAL | 0 refills | Status: DC | PRN

## 2023-10-24 ENCOUNTER — Other Ambulatory Visit: Payer: Self-pay | Admitting: Family

## 2023-10-27 ENCOUNTER — Encounter: Payer: Self-pay | Admitting: Gastroenterology

## 2023-10-27 ENCOUNTER — Other Ambulatory Visit (HOSPITAL_COMMUNITY): Payer: Self-pay

## 2023-10-27 ENCOUNTER — Ambulatory Visit (AMBULATORY_SURGERY_CENTER): Payer: Medicare Other | Admitting: Gastroenterology

## 2023-10-27 VITALS — BP 97/61 | HR 84 | Temp 97.3°F | Resp 12 | Ht 68.0 in | Wt 148.0 lb

## 2023-10-27 DIAGNOSIS — K641 Second degree hemorrhoids: Secondary | ICD-10-CM | POA: Diagnosis not present

## 2023-10-27 DIAGNOSIS — E785 Hyperlipidemia, unspecified: Secondary | ICD-10-CM | POA: Diagnosis not present

## 2023-10-27 DIAGNOSIS — K5730 Diverticulosis of large intestine without perforation or abscess without bleeding: Secondary | ICD-10-CM

## 2023-10-27 DIAGNOSIS — D128 Benign neoplasm of rectum: Secondary | ICD-10-CM

## 2023-10-27 DIAGNOSIS — K6289 Other specified diseases of anus and rectum: Secondary | ICD-10-CM | POA: Diagnosis not present

## 2023-10-27 DIAGNOSIS — K644 Residual hemorrhoidal skin tags: Secondary | ICD-10-CM | POA: Diagnosis not present

## 2023-10-27 DIAGNOSIS — K621 Rectal polyp: Secondary | ICD-10-CM | POA: Diagnosis not present

## 2023-10-27 DIAGNOSIS — Z1211 Encounter for screening for malignant neoplasm of colon: Secondary | ICD-10-CM | POA: Diagnosis not present

## 2023-10-27 DIAGNOSIS — Z860100 Personal history of colon polyps, unspecified: Secondary | ICD-10-CM

## 2023-10-27 MED ORDER — SODIUM CHLORIDE 0.9 % IV SOLN: 500.0000 mL | Freq: Once | INTRAVENOUS | Status: DC

## 2023-10-27 NOTE — Progress Notes (Signed)
GASTROENTEROLOGY PROCEDURE H&P NOTE   Primary Care Physician: Sandford Craze, NP  HPI: Teresa Morgan is a 67 y.o. female who presents for Colonoscopy for surveillance of previous colon cancer.  Past Medical History:  Diagnosis Date   Anemia    Anxiety    Arthritis    Borderline diabetes    Cataract    bilat removed   Colon cancer (HCC) 2013   cecal cancer   COPD with emphysema (HCC) 06/18/2012   Depression    GERD (gastroesophageal reflux disease)    Hyperlipidemia 05/27/2021   Neuromuscular disorder (HCC)    neuropathy from chemo   Peptic ulcer    Pneumonia    Shortness of breath dyspnea    with exertion   Past Surgical History:  Procedure Laterality Date   APPENDECTOMY  07/07/11   BREAST EXCISIONAL BIOPSY Right 03/15/2018   COLON RESECTION  2012   for colon cancer   COLONOSCOPY     EYE SURGERY Bilateral    cataract surgery with lens implant   heel spurs Right    hemrrhoids     PLANTAR FASCIA SURGERY Right 2009   PORT-A-CATH REMOVAL Left 04/19/2016   Procedure: REMOVAL PORT-A-CATH;  Surgeon: Almond Lint, MD;  Location: [facility] OR;  Service: General;  Laterality: Left;   portacath     RADIOACTIVE SEED GUIDED EXCISIONAL BREAST BIOPSY Right 03/15/2018   Procedure: RADIOACTIVE SEED GUIDED EXCISIONAL BREAST BIOPSY;  Surgeon: Almond Lint, MD;  Location: [facility];  Service: General;  Laterality: Right;   TUBAL LIGATION  07/1980   Current Outpatient Medications  Medication Sig Dispense Refill   albuterol (VENTOLIN HFA) 108 (90 Base) MCG/ACT inhaler INHALE 1 - 2 PUFFS INTO THE LUNGS EVERY 6 HOURS AS NEEDED FOR WHEEZING OR SHORTNESS OF BREATH 8.5 g 2   ALPRAZolam (XANAX) 1 MG tablet TAKE 1 TABLET BY MOUTH EVERY DAY 30 tablet 0   atorvastatin (LIPITOR) 20 MG tablet Take 1 tablet (20 mg total) by mouth daily. 90 tablet 0   Calcium-Vitamin D 600-125 MG-UNIT TABS Take 1 tablet by mouth daily.     Multiple Vitamin (MULTIVITAMIN) tablet Take 1 tablet by mouth  daily.     oxyCODONE-acetaminophen (PERCOCET) 10-325 MG tablet Take 1 tablet by mouth 4 (four) times daily as needed. 120 tablet 0   TRINTELLIX 5 MG TABS tablet TAKE 1 TABLET(5 MG) BY MOUTH DAILY 90 tablet 0   TURMERIC PO Take 1 tablet by mouth daily.     vitamin E 200 UNIT capsule Take 200 Units by mouth daily.     acetaminophen (TYLENOL) 325 MG tablet Take 325 mg by mouth every 6 (six) hours as needed.     betamethasone dipropionate 0.05 % cream Apply topically 2 (two) times daily. (Patient not taking: Reported on 10/27/2023) 30 g 0   EPINEPHrine (EPIPEN 2-PAK) 0.3 mg/0.3 mL IJ SOAJ injection Inject 0.3 mg into the muscle as needed for anaphylaxis. (Patient not taking: Reported on 10/27/2023) 2 each 0   fluticasone-salmeterol (WIXELA INHUB) 250-50 MCG/ACT AEPB Inhale 1 puff into the lungs in the morning and at bedtime.     nicotine (NICOTROL) 10 MG inhaler Inhale 1 Cartridge (1 continuous puffing total) into the lungs as needed for smoking cessation. (Patient not taking: Reported on 10/27/2023) 150 each 0   Current Facility-Administered Medications  Medication Dose Route Frequency Provider Last Rate Last Admin   0.9 %  sodium chloride infusion  500 mL Intravenous Once Mansouraty, Netty Starring., MD  Current Outpatient Medications:    albuterol (VENTOLIN HFA) 108 (90 Base) MCG/ACT inhaler, INHALE 1 - 2 PUFFS INTO THE LUNGS EVERY 6 HOURS AS NEEDED FOR WHEEZING OR SHORTNESS OF BREATH, Disp: 8.5 g, Rfl: 2   ALPRAZolam (XANAX) 1 MG tablet, TAKE 1 TABLET BY MOUTH EVERY DAY, Disp: 30 tablet, Rfl: 0   atorvastatin (LIPITOR) 20 MG tablet, Take 1 tablet (20 mg total) by mouth daily., Disp: 90 tablet, Rfl: 0   Calcium-Vitamin D 600-125 MG-UNIT TABS, Take 1 tablet by mouth daily., Disp: , Rfl:    Multiple Vitamin (MULTIVITAMIN) tablet, Take 1 tablet by mouth daily., Disp: , Rfl:    oxyCODONE-acetaminophen (PERCOCET) 10-325 MG tablet, Take 1 tablet by mouth 4 (four) times daily as needed., Disp: 120  tablet, Rfl: 0   TRINTELLIX 5 MG TABS tablet, TAKE 1 TABLET(5 MG) BY MOUTH DAILY, Disp: 90 tablet, Rfl: 0   TURMERIC PO, Take 1 tablet by mouth daily., Disp: , Rfl:    vitamin E 200 UNIT capsule, Take 200 Units by mouth daily., Disp: , Rfl:    acetaminophen (TYLENOL) 325 MG tablet, Take 325 mg by mouth every 6 (six) hours as needed., Disp: , Rfl:    betamethasone dipropionate 0.05 % cream, Apply topically 2 (two) times daily. (Patient not taking: Reported on 10/27/2023), Disp: 30 g, Rfl: 0   EPINEPHrine (EPIPEN 2-PAK) 0.3 mg/0.3 mL IJ SOAJ injection, Inject 0.3 mg into the muscle as needed for anaphylaxis. (Patient not taking: Reported on 10/27/2023), Disp: 2 each, Rfl: 0   fluticasone-salmeterol (WIXELA INHUB) 250-50 MCG/ACT AEPB, Inhale 1 puff into the lungs in the morning and at bedtime., Disp: , Rfl:    nicotine (NICOTROL) 10 MG inhaler, Inhale 1 Cartridge (1 continuous puffing total) into the lungs as needed for smoking cessation. (Patient not taking: Reported on 10/27/2023), Disp: 150 each, Rfl: 0  Current Facility-Administered Medications:    0.9 %  sodium chloride infusion, 500 mL, Intravenous, Once, Mansouraty, Netty Starring., MD Allergies  Allergen Reactions   Aspirin     Upset stomach   Cymbalta [Duloxetine Hcl] Swelling    Swollen lips    Prozac [Fluoxetine Hcl] Swelling    Lip swelling   Wellbutrin [Bupropion] Swelling    Lip swelling   Family History  Problem Relation Age of Onset   Diabetes Mother    Colon cancer Cousin    Colon cancer Cousin    Thalassemia Daughter    Colon polyps Neg Hx    Rectal cancer Neg Hx    Stomach cancer Neg Hx    Social History   Socioeconomic History   Marital status: Married    Spouse name: Not on file   Number of children: 3   Years of education: Not on file   Highest education level: Some college, no degree  Occupational History   Occupation: Marine scientist: [employer]  Tobacco Use   Smoking status: Every Day    Current  packs/day: 0.00    Average packs/day: 1 pack/day for 45.0 years (45.0 ttl pk-yrs)    Types: Cigarettes    Start date: 04/23/1986    Last attempt to quit: 09/04/2023    Years since quitting: 0.1   Smokeless tobacco: Never   Tobacco comments:    Quit smoking 1 week ago.  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   Married      Social Drivers of Corporate investment banker Strain: Low Risk  (08/08/2023)   Overall Financial Resource Strain (CARDIA)    Difficulty of Paying Living Expenses: Not very hard  Food Insecurity: No Food Insecurity (08/08/2023)   Hunger Vital Sign    Worried About Running Out of Food in the Last Year: Never true    Ran Out of Food in the Last Year: Never true  Transportation Needs: No Transportation Needs (08/08/2023)   PRAPARE - Administrator, Civil Service (Medical): No    Lack of Transportation (Non-Medical): No  Physical Activity: Insufficiently Active (08/08/2023)   Exercise Vital Sign    Days of Exercise per Week: 1 day    Minutes of Exercise per Session: 30 min  Stress: No Stress Concern Present (08/08/2023)   [employer] of Occupational Health - Occupational Stress Questionnaire    Feeling of Stress : Not at all  Social Connections: Moderately Isolated (08/08/2023)   Social Connection and Isolation Panel [NHANES]    Frequency of Communication with Friends and Family: Once a week    Frequency of Social Gatherings with Friends and Family: Once a week    Attends Religious Services: More than 4 times per year    Active Member of [employer] or Organizations: No    Attends Tax inspector Meetings: Not on file    Marital Status: Married  Catering manager Violence: Not At Risk (09/06/2021)   Humiliation, Afraid, Rape, and Kick questionnaire    Fear of Current or Ex-Partner: No    Emotionally Abused: No    Physically Abused: No    Sexually Abused: No    Physical Exam: Today's Vitals   10/27/23 0711  BP: 112/63  Pulse: 78  Temp: (!) 97.3 F (36.3 C)  TempSrc: Temporal  SpO2: 97%  Weight: 148 lb (67.1 kg)  Height: 5\' 8"  (1.727 m)   Body mass index is 22.5 kg/m. GEN: NAD EYE: Sclerae anicteric ENT: MMM CV: Non-tachycardic GI: Soft, NT/ND NEURO:  Alert & Oriented x 3  Lab Results: No results for input(s): "WBC", "HGB", "HCT", "PLT" in the last 72 hours. BMET No results for input(s): "NA", "K", "CL", "CO2", "GLUCOSE", "BUN", "CREATININE", "CALCIUM" in the last 72 hours. LFT No results for input(s): "PROT", "ALBUMIN", "AST", "ALT", "ALKPHOS", "BILITOT", "BILIDIR", "IBILI" in the last 72 hours. PT/INR No results for input(s): "LABPROT", "INR" in the last 72 hours.   Impression / Plan: This is a 67 y.o.female who presents for Colonoscopy for surveillance of previous colon cancer.  The risks and benefits of endoscopic evaluation/treatment were discussed with the patient and/or family; these include but are not limited to the risk of perforation, infection, bleeding, missed lesions, lack of diagnosis, severe illness requiring hospitalization, as well as anesthesia and sedation related illnesses.  The patient's history has been reviewed, patient examined, no change in status, and deemed stable for procedure.  The patient and/or family is agreeable to proceed.    Corliss Parish, MD [facility] Office # [phone]

## 2023-10-27 NOTE — Progress Notes (Signed)
Vss nad trans to pacu 

## 2023-10-27 NOTE — Op Note (Signed)
[facility] Patient Name: Teresa Morgan Procedure Date: 10/27/2023 7:54 AM MRN: [id number] Endoscopist: Corliss Parish , MD, [id number] Age: 67 Referring MD:  Date of Birth: [birth year] Gender: Female Account #: [id number] Procedure:                Colonoscopy Indications:              High risk colon cancer surveillance: Personal                            history of colon cancer Medicines:                Monitored Anesthesia Care Procedure:                Pre-Anesthesia Assessment:                           - Prior to the procedure, a History and Physical                            was performed, and patient medications and                            allergies were reviewed. The patient's tolerance of                            previous anesthesia was also reviewed. The risks                            and benefits of the procedure and the sedation                            options and risks were discussed with the patient.                            All questions were answered, and informed consent                            was obtained. Prior Anticoagulants: The patient has                            taken no anticoagulant or antiplatelet agents. ASA                            Grade Assessment: III - A patient with severe                            systemic disease. After reviewing the risks and                            benefits, the patient was deemed in satisfactory                            condition to undergo the procedure.  After obtaining informed consent, the colonoscope                            was passed under direct vision. Throughout the                            procedure, the patient's blood pressure, pulse, and                            oxygen saturations were monitored continuously. The                            Olympus Scope [id number] was introduced through the                            anus and advanced to the the  ileocolonic                            anastomosis. The colonoscopy was performed without                            difficulty. The patient tolerated the procedure.                            The quality of the bowel preparation was good. Scope In: 7:58:33 AM Scope Out: 8:09:55 AM Scope Withdrawal Time: 0 hours 7 minutes 0 seconds  Total Procedure Duration: 0 hours 11 minutes 22 seconds  Findings:                 The digital rectal exam findings include                            hemorrhoids. Pertinent negatives include no                            palpable rectal lesions.                           There was evidence of a prior functional end-to-end                            ileo-colonic anastomosis in the ascending colon.                            This was patent and was characterized by healthy                            appearing mucosa.                           Multiple small-mouthed diverticula were found in                            the recto-sigmoid colon and sigmoid colon.  Three sessile polyps were found in the rectum. The                            polyps were 2 to 4 mm in size. These polyps were                            removed with a cold snare. Resection and retrieval                            were complete.                           Normal mucosa was found in the entire colon                            otherwise.                           Non-bleeding non-thrombosed external and internal                            hemorrhoids were found during retroflexion, during                            perianal exam and during digital exam. The                            hemorrhoids were Grade II (internal hemorrhoids                            that prolapse but reduce spontaneously). Complications:            No immediate complications. Estimated Blood Loss:     Estimated blood loss was minimal. Impression:               - Hemorrhoids found on  digital rectal exam.                           - Patent functional end-to-end ileo-colonic                            anastomosis, characterized by healthy appearing                            mucosa.                           - Diverticulosis in the recto-sigmoid colon and in                            the sigmoid colon.                           - Three, 2 to 4 mm polyps in the rectum, removed  with a cold snare. Resected and retrieved.                           - Normal mucosa in the entire examined colon                            otherwise.                           - Non-bleeding non-thrombosed external and internal                            hemorrhoids. Recommendation:           - The patient will be observed post-procedure,                            until all discharge criteria are met.                           - Discharge patient to home.                           - Patient has a contact number available for                            emergencies. The signs and symptoms of potential                            delayed complications were discussed with the                            patient. Return to normal activities tomorrow.                            Written discharge instructions were provided to the                            patient.                           - High fiber diet.                           - Use FiberCon 1-2 tablets PO daily.                           - Continue present medications.                           - Await pathology results.                           - Repeat colonoscopy in 3 - 5 years for                            surveillance based on final pathology.                           -  The findings and recommendations were discussed                            with the patient.                           - The findings and recommendations were discussed                            with the patient's family. Corliss Parish, MD 10/27/2023 8:15:56 AM

## 2023-10-27 NOTE — Progress Notes (Signed)
Pt's states no medical or surgical changes since previsit or office visit. Pt states only change is that she is still smoking cigarettes a couple times a day.  Chart updated.

## 2023-10-27 NOTE — Patient Instructions (Signed)
Await pathology results.  High fiber diet.  Use FiberCon 1-2 tablets daily.  Continue present medications.  Handout on polyps and high fiber diet provided.  YOU HAD AN ENDOSCOPIC PROCEDURE TODAY AT THE [facility]:   Refer to the procedure report that was given to you for any specific questions about what was found during the examination.  If the procedure report does not answer your questions, please call your gastroenterologist to clarify.  If you requested that your care partner not be given the details of your procedure findings, then the procedure report has been included in a sealed envelope for you to review at your convenience later.  YOU SHOULD EXPECT: Some feelings of bloating in the abdomen. Passage of more gas than usual.  Walking can help get rid of the air that was put into your GI tract during the procedure and reduce the bloating. If you had a lower endoscopy (such as a colonoscopy or flexible sigmoidoscopy) you may notice spotting of blood in your stool or on the toilet paper. If you underwent a bowel prep for your procedure, you may not have a normal bowel movement for a few days.  Please Note:  You might notice some irritation and congestion in your nose or some drainage.  This is from the oxygen used during your procedure.  There is no need for concern and it should clear up in a day or so.  SYMPTOMS TO REPORT IMMEDIATELY:  Following lower endoscopy (colonoscopy or flexible sigmoidoscopy):  Excessive amounts of blood in the stool  Significant tenderness or worsening of abdominal pains  Swelling of the abdomen that is new, acute  Fever of 100F or higher  For urgent or emergent issues, a gastroenterologist can be reached at any hour by calling [phone]. Do not use MyChart messaging for urgent concerns.    DIET:  We do recommend a small meal at first, but then you may proceed to your regular diet.  Drink plenty of fluids but you should avoid alcoholic  beverages for 24 hours.  ACTIVITY:  You should plan to take it easy for the rest of today and you should NOT DRIVE or use heavy machinery until tomorrow (because of the sedation medicines used during the test).    FOLLOW UP: Our staff will call the number listed on your records the next business day following your procedure.  We will call around 7:15- 8:00 am to check on you and address any questions or concerns that you may have regarding the information given to you following your procedure. If we do not reach you, we will leave a message.     If any biopsies were taken you will be contacted by phone or by letter within the next 1-3 weeks.  Please call us at [phone] if you have not heard about the biopsies in 3 weeks.    SIGNATURES/CONFIDENTIALITY: You and/or your care partner have signed paperwork which will be entered into your electronic medical record.  These signatures attest to the fact that that the information above on your After Visit Summary has been reviewed and is understood.  Full responsibility of the confidentiality of this discharge information lies with you and/or your care-partner.

## 2023-10-27 NOTE — Progress Notes (Signed)
Called to room to assist during endoscopic procedure.  Patient ID and intended procedure confirmed with present staff. Received instructions for my participation in the procedure from the performing physician.  

## 2023-10-28 ENCOUNTER — Other Ambulatory Visit (HOSPITAL_COMMUNITY): Payer: Self-pay

## 2023-10-28 MED ORDER — OXYCODONE-ACETAMINOPHEN 10-325 MG PO TABS
1.0000 | ORAL_TABLET | Freq: Four times a day (QID) | ORAL | 0 refills | Status: DC | PRN
  Filled 2023-10-28: qty 20, 5d supply, fill #0

## 2023-10-30 ENCOUNTER — Telehealth: Payer: Self-pay

## 2023-10-30 NOTE — Telephone Encounter (Signed)
  Follow up Call-     10/27/2023    7:12 AM  Call back number  Post procedure Call Back phone  # [phone]  Permission to leave phone message Yes    Post op call attempted, no answer, no VM.

## 2023-10-31 ENCOUNTER — Other Ambulatory Visit (HOSPITAL_COMMUNITY): Payer: Self-pay

## 2023-11-01 ENCOUNTER — Encounter: Payer: Self-pay | Admitting: Gastroenterology

## 2023-11-01 LAB — SURGICAL PATHOLOGY

## 2023-11-02 ENCOUNTER — Other Ambulatory Visit (HOSPITAL_COMMUNITY): Payer: Self-pay

## 2023-11-02 MED ORDER — OXYCODONE-ACETAMINOPHEN 10-325 MG PO TABS
1.0000 | ORAL_TABLET | Freq: Four times a day (QID) | ORAL | 0 refills | Status: DC | PRN
  Filled 2023-11-02: qty 100, 25d supply, fill #0

## 2023-11-12 ENCOUNTER — Other Ambulatory Visit: Payer: Self-pay | Admitting: Hematology & Oncology

## 2023-11-12 DIAGNOSIS — C180 Malignant neoplasm of cecum: Secondary | ICD-10-CM

## 2023-11-12 DIAGNOSIS — J430 Unilateral pulmonary emphysema [MacLeod's syndrome]: Secondary | ICD-10-CM

## 2023-11-12 DIAGNOSIS — G629 Polyneuropathy, unspecified: Secondary | ICD-10-CM

## 2023-11-15 ENCOUNTER — Other Ambulatory Visit: Payer: Self-pay | Admitting: Family

## 2023-11-15 DIAGNOSIS — Z720 Tobacco use: Secondary | ICD-10-CM

## 2023-11-16 ENCOUNTER — Other Ambulatory Visit: Payer: Self-pay | Admitting: Family

## 2023-11-17 ENCOUNTER — Telehealth (HOSPITAL_BASED_OUTPATIENT_CLINIC_OR_DEPARTMENT_OTHER): Payer: Self-pay

## 2023-11-22 ENCOUNTER — Ambulatory Visit (HOSPITAL_BASED_OUTPATIENT_CLINIC_OR_DEPARTMENT_OTHER)
Admission: RE | Admit: 2023-11-22 | Discharge: 2023-11-22 | Disposition: A | Discharge status: Home / Self Care | Payer: Medicare Other | Source: Ambulatory Visit | Attending: Family | Admitting: Family

## 2023-11-22 DIAGNOSIS — I700 Atherosclerosis of aorta: Secondary | ICD-10-CM | POA: Insufficient documentation

## 2023-11-22 DIAGNOSIS — Z122 Encounter for screening for malignant neoplasm of respiratory organs: Secondary | ICD-10-CM | POA: Diagnosis not present

## 2023-11-22 DIAGNOSIS — Z720 Tobacco use: Secondary | ICD-10-CM

## 2023-11-22 DIAGNOSIS — I2510 Atherosclerotic heart disease of native coronary artery without angina pectoris: Secondary | ICD-10-CM | POA: Insufficient documentation

## 2023-11-22 DIAGNOSIS — J438 Other emphysema: Secondary | ICD-10-CM | POA: Insufficient documentation

## 2023-11-22 DIAGNOSIS — F17210 Nicotine dependence, cigarettes, uncomplicated: Secondary | ICD-10-CM | POA: Diagnosis not present

## 2023-11-27 ENCOUNTER — Other Ambulatory Visit (HOSPITAL_COMMUNITY): Payer: Self-pay

## 2023-11-27 DIAGNOSIS — Z789 Other specified health status: Secondary | ICD-10-CM | POA: Diagnosis not present

## 2023-11-27 DIAGNOSIS — R7303 Prediabetes: Secondary | ICD-10-CM | POA: Diagnosis not present

## 2023-11-27 DIAGNOSIS — F17210 Nicotine dependence, cigarettes, uncomplicated: Secondary | ICD-10-CM | POA: Diagnosis not present

## 2023-11-27 DIAGNOSIS — Z6822 Body mass index (BMI) 22.0-22.9, adult: Secondary | ICD-10-CM | POA: Diagnosis not present

## 2023-11-27 DIAGNOSIS — Z0130 Encounter for examination of blood pressure without abnormal findings: Secondary | ICD-10-CM | POA: Diagnosis not present

## 2023-11-27 MED ORDER — OXYCODONE-ACETAMINOPHEN 10-325 MG PO TABS
1.0000 | ORAL_TABLET | Freq: Four times a day (QID) | ORAL | 0 refills | Status: AC | PRN
  Filled 2023-11-27: qty 110, 28d supply, fill #0

## 2023-12-01 ENCOUNTER — Telehealth: Payer: Self-pay

## 2023-12-01 ENCOUNTER — Telehealth: Payer: Self-pay | Admitting: Family

## 2023-12-01 DIAGNOSIS — K769 Liver disease, unspecified: Secondary | ICD-10-CM

## 2023-12-01 DIAGNOSIS — R160 Hepatomegaly, not elsewhere classified: Secondary | ICD-10-CM

## 2023-12-01 NOTE — Telephone Encounter (Signed)
Please advise pt no sign of lung cancer on screening CT.  The radiologist did see some areas in her liver that need closer evaluation. I would like for her to complete an MRI of her liver for further evaluation. MRI order placed.

## 2023-12-01 NOTE — Telephone Encounter (Signed)
Patient notified of results and recommendations. She was advised radiology will call to set up MRI. She verbalized understanding

## 2023-12-01 NOTE — Telephone Encounter (Signed)
Copied from CRM [id number]. Topic: Clinical - Lab/Test Results >> Dec 01, 2023 12:26 PM Corin V wrote: Reason for CRM: Ruby with [facility] called to alert Sandford Craze that the CT results from 11/22/23 are ready and in the chart to be read/

## 2023-12-01 NOTE — Telephone Encounter (Signed)
Noted see phone note

## 2023-12-09 ENCOUNTER — Other Ambulatory Visit: Payer: Self-pay | Admitting: Hematology & Oncology

## 2023-12-09 DIAGNOSIS — C180 Malignant neoplasm of cecum: Secondary | ICD-10-CM

## 2023-12-09 DIAGNOSIS — G629 Polyneuropathy, unspecified: Secondary | ICD-10-CM

## 2023-12-09 DIAGNOSIS — J430 Unilateral pulmonary emphysema [MacLeod's syndrome]: Secondary | ICD-10-CM

## 2023-12-15 ENCOUNTER — Telehealth: Payer: Self-pay | Admitting: Family

## 2023-12-15 NOTE — Telephone Encounter (Signed)
Patient reports she has not scheduled due to having "a lot of other appointments at this time. She has the number and will be scheduling soon"

## 2023-12-15 NOTE — Telephone Encounter (Signed)
It looks like her MRI has not yet been scheduled. Please give her number to imaging so she can call to schedule.

## 2023-12-21 ENCOUNTER — Telehealth: Payer: Self-pay | Admitting: Family

## 2023-12-21 ENCOUNTER — Ambulatory Visit: Payer: Medicare Other

## 2023-12-21 VITALS — Ht 68.0 in | Wt 144.0 lb

## 2023-12-21 DIAGNOSIS — Z0000 Encounter for general adult medical examination without abnormal findings: Secondary | ICD-10-CM

## 2023-12-21 NOTE — Progress Notes (Signed)
 Subjective:   Teresa Morgan is a 68 y.o. female who presents for Medicare Annual (Subsequent) preventive examination.  Visit Complete: Virtual I connected with  Ronal LITTIE Clinch on 12/21/23 by a audio enabled telemedicine application and verified that I am speaking with the correct person using two identifiers.  Patient Location: Home  Provider Location: Home Office  I discussed the limitations of evaluation and management by telemedicine. The patient expressed understanding and agreed to proceed.  Vital Signs: Because this visit was a virtual/telehealth visit, some criteria may be missing or patient reported. Any vitals not documented were not able to be obtained and vitals that have been documented are patient reported.    Cardiac Risk Factors include: advanced age (>72men, >31 women);smoking/ tobacco exposure     Objective:    Today's Vitals   12/21/23 0944  Weight: 144 lb (65.3 kg)  Height: 5' 8 (1.727 m)   Body mass index is 21.9 kg/m.     12/21/2023    9:53 AM 08/14/2023    3:11 PM 09/08/2022   10:22 AM 07/25/2022   11:51 AM 09/06/2021   10:28 AM 09/01/2021   10:38 AM 07/23/2021   12:09 PM  Advanced Directives  Does Patient Have a Medical Advance Directive? No No No No No No No  Would patient like information on creating a medical advance directive? No - Patient declined No - Patient declined No - Patient declined No - Patient declined Yes (MAU/Ambulatory/Procedural Areas - Information given) No - Patient declined No - Patient declined    Current Medications (verified) Outpatient Encounter Medications as of 12/21/2023  Medication Sig   acetaminophen  (TYLENOL ) 325 MG tablet Take 325 mg by mouth every 6 (six) hours as needed.   albuterol  (VENTOLIN  HFA) 108 (90 Base) MCG/ACT inhaler INHALE 1 - 2 PUFFS INTO THE LUNGS EVERY 6 HOURS AS NEEDED FOR WHEEZING OR SHORTNESS OF BREATH   ALPRAZolam  (XANAX ) 1 MG tablet TAKE 1 TABLET BY MOUTH EVERY DAY   atorvastatin  (LIPITOR) 20 MG  tablet Take 1 tablet (20 mg total) by mouth daily.   betamethasone  dipropionate 0.05 % cream Apply topically 2 (two) times daily. (Patient not taking: Reported on 10/27/2023)   Calcium -Vitamin D 600-125 MG-UNIT TABS Take 1 tablet by mouth daily.   EPINEPHrine  (EPIPEN  2-PAK) 0.3 mg/0.3 mL IJ SOAJ injection Inject 0.3 mg into the muscle as needed for anaphylaxis. (Patient not taking: Reported on 10/27/2023)   fluticasone -salmeterol (WIXELA INHUB) 250-50 MCG/ACT AEPB Inhale 1 puff into the lungs in the morning and at bedtime.   Multiple Vitamin (MULTIVITAMIN) tablet Take 1 tablet by mouth daily.   nicotine  (NICOTROL ) 10 MG inhaler Inhale 1 Cartridge (1 continuous puffing total) into the lungs as needed for smoking cessation. (Patient not taking: Reported on 10/27/2023)   oxyCODONE -acetaminophen  (PERCOCET) 10-325 MG tablet Take 1 tablet by mouth 4 (four) times daily as needed.   oxyCODONE -acetaminophen  (PERCOCET) 10-325 MG tablet Take 1 tablet by mouth 4 (four) times daily as needed.   TRINTELLIX  5 MG TABS tablet TAKE 1 TABLET(5 MG) BY MOUTH DAILY   TURMERIC PO Take 1 tablet by mouth daily.   vitamin E 200 UNIT capsule Take 200 Units by mouth daily.   No facility-administered encounter medications on file as of 12/21/2023.    Allergies (verified) Aspirin, Cymbalta  [duloxetine  hcl], Prozac [fluoxetine hcl], and Wellbutrin  [bupropion ]   History: Past Medical History:  Diagnosis Date   Anemia    Anxiety    Arthritis    Borderline  diabetes    Cataract    bilat removed   Colon cancer (HCC) 2013   cecal cancer   COPD with emphysema (HCC) 06/18/2012   Depression    GERD (gastroesophageal reflux disease)    Hyperlipidemia 05/27/2021   Neuromuscular disorder (HCC)    neuropathy from chemo   Peptic ulcer    Pneumonia    Shortness of breath dyspnea    with exertion   Past Surgical History:  Procedure Laterality Date   APPENDECTOMY  07/07/11   BREAST EXCISIONAL BIOPSY Right 03/15/2018    COLON RESECTION  2012   for colon cancer   COLONOSCOPY     EYE SURGERY Bilateral    cataract surgery with lens implant   heel spurs Right    hemrrhoids     PLANTAR FASCIA SURGERY Right 2009   PORT-A-CATH REMOVAL Left 04/19/2016   Procedure: REMOVAL PORT-A-CATH;  Surgeon: Jina Nephew, MD;  Location: [facility] OR;  Service: General;  Laterality: Left;   portacath     RADIOACTIVE SEED GUIDED EXCISIONAL BREAST BIOPSY Right 03/15/2018   Procedure: RADIOACTIVE SEED GUIDED EXCISIONAL BREAST BIOPSY;  Surgeon: Nephew Jina, MD;  Location: [facility];  Service: General;  Laterality: Right;   TUBAL LIGATION  07/1980   Family History  Problem Relation Age of Onset   Diabetes Mother    Colon cancer Cousin    Colon cancer Cousin    Thalassemia Daughter    Colon polyps Neg Hx    Rectal cancer Neg Hx    Stomach cancer Neg Hx    Social History   Socioeconomic History   Marital status: Married    Spouse name: Not on file   Number of children: 3   Years of education: Not on file   Highest education level: Some college, no degree  Occupational History   Occupation: Marine Scientist: [employer]  Tobacco Use   Smoking status: Every Day    Current packs/day: 0.00    Average packs/day: 1 pack/day for 45.0 years (45.0 ttl pk-yrs)    Types: Cigarettes    Start date: 04/23/1986    Last attempt to quit: 09/04/2023    Years since quitting: 0.2   Smokeless tobacco: Never   Tobacco comments:    Quit smoking 1 week ago.  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   Married      Social Drivers of Corporate Investment Banker  Strain: Low Risk  (12/21/2023)   Overall Financial Resource Strain (CARDIA)    Difficulty of Paying Living Expenses: Not hard at all  Food Insecurity: No Food Insecurity (12/21/2023)   Hunger Vital Sign    Worried About Running Out of Food in the Last Year: Never true    Ran Out of Food in the Last Year: Never true  Transportation Needs: No Transportation Needs (12/21/2023)   PRAPARE - Administrator, Civil Service (Medical): No    Lack of Transportation (Non-Medical): No  Physical Activity: Insufficiently Active (12/21/2023)   Exercise Vital Sign  Days of Exercise per Week: 5 days    Minutes of Exercise per Session: 10 min  Stress: No Stress Concern Present (12/21/2023)   [employer] of Occupational Health - Occupational Stress Questionnaire    Feeling of Stress : Not at all  Social Connections: Socially Integrated (12/21/2023)   Social Connection and Isolation Panel [NHANES]    Frequency of Communication with Friends and Family: More than three times a week    Frequency of Social Gatherings with Friends and Family: More than three times a week    Attends Religious Services: More than 4 times per year    Active Member of [employer] or Organizations: Yes    Attends Engineer, Structural: More than 4 times per year    Marital Status: Married    Tobacco Counseling Ready to quit: Yes Counseling given: Yes Tobacco comments: Quit smoking 1 week ago.  09/11/2023 hfb   Clinical Intake:  Pre-visit preparation completed: Yes  Pain : No/denies pain     BMI - recorded: 21.9 Nutritional Status: BMI of 19-24  Normal Nutritional Risks: None Diabetes: No  How often do you need to have someone help you when you read instructions, pamphlets, or other written materials from your doctor or pharmacy?: 1 - Never  Interpreter Needed?: No  Information entered by :: Rojelio Blush LPN   Activities of Daily Living    12/21/2023    9:52 AM  In your present state of health, do  you have any difficulty performing the following activities:  Hearing? 0  Vision? 0  Difficulty concentrating or making decisions? 0  Walking or climbing stairs? 0  Dressing or bathing? 0  Doing errands, shopping? 0  Preparing Food and eating ? N  Using the Toilet? N  In the past six months, have you accidently leaked urine? N  Do you have problems with loss of bowel control? N  Managing your Medications? N  Managing your Finances? N  Housekeeping or managing your Housekeeping? N    Patient Care Team: Daryl Setter, NP as PCP - General (Internal Medicine) Brien Belvie BRAVO, MD as Attending Physician (Pulmonary Disease) Timmy Maude SAUNDERS, MD as Consulting Physician (Oncology)  Indicate any recent Medical Services you may have received from other than [facility] providers in the past year (date may be approximate).     Assessment:   This is a routine wellness examination for [facility].  Hearing/Vision screen Hearing Screening - Comments:: Denies hearing difficulties   Vision Screening - Comments:: Wears rx glasses - up to date with routine eye exams with  [facility]   Goals Addressed               This Visit's Progress     Quit Smoking (pt-stated)         Depression Screen    12/21/2023    9:52 AM 08/08/2023    1:28 PM 09/08/2022   10:23 AM 06/17/2022   10:30 AM 09/06/2021   10:31 AM 11/24/2020    1:49 PM 09/25/2020    1:07 PM  PHQ 2/9 Scores  PHQ - 2 Score 0 0 4 0 0 3 3  PHQ- 9 Score  0 6   6 6     Fall Risk    12/21/2023    9:52 AM 08/08/2023    1:29 PM 02/02/2023   12:01 PM 09/08/2022   10:22 AM 06/17/2022   10:30 AM  Fall Risk   Falls in the past year? 0 0 0  0 0  Number falls in past yr: 0 0 0 0 0  Injury with Fall? 0 0 0 0 0  Risk for fall due to : No Fall Risks No Fall Risks No Fall Risks No Fall Risks   Follow up Falls prevention discussed;Falls evaluation completed Falls evaluation completed Falls evaluation completed Falls evaluation completed      MEDICARE RISK AT HOME: Medicare Risk at Home Any stairs in or around the home?: Yes If so, are there any without handrails?: No Home free of loose throw rugs in walkways, pet beds, electrical cords, etc?: Yes Adequate lighting in your home to reduce risk of falls?: Yes Life alert?: No Use of a cane, walker or w/c?: No Grab bars in the bathroom?: No Shower chair or bench in shower?: No Elevated toilet seat or a handicapped toilet?: No  TIMED UP AND GO:  Was the test performed?      Cognitive Function:        12/21/2023    9:53 AM 09/08/2022   10:29 AM  6CIT Screen  What Year? 0 points 0 points  What month? 0 points 0 points  What time? 0 points 0 points  Count back from 20 0 points 0 points  Months in reverse 0 points 0 points  Repeat phrase 0 points 2 points  Total Score 0 points 2 points    Immunizations Immunization History  Administered Date(s) Administered   Fluad Quad(high Dose 65+) 07/29/2022, 07/26/2023   Influenza Split 11/14/2012, 08/16/2021   Influenza, High Dose Seasonal PF 08/24/2020   Influenza, Seasonal, Injecte, Preservative Fre 11/12/2014   Influenza,inj,Quad PF,6+ Mos 07/24/2017, 10/22/2018   Influenza-Unspecified 09/13/2013, 07/31/2015, 08/04/2016   Moderna SARS-COV2 Booster Vaccination 03/30/2021   Moderna Sars-Covid-2 Vaccination 12/28/2019, 01/18/2020, 10/05/2020   PNEUMOCOCCAL CONJUGATE-20 06/17/2022   Pfizer(Comirnaty )Fall Seasonal Vaccine 12 years and older 09/05/2022, 08/08/2023   Pneumococcal Polysaccharide-23 03/12/2013, 05/20/2020   Respiratory Syncytial Virus Vaccine ,Recomb Aduvanted(Arexvy ) 09/05/2022   Tdap 03/12/2013   Zoster Recombinant(Shingrix ) 10/22/2018, 05/07/2019    TDAP status: Due, Education has been provided regarding the importance of this vaccine. Advised may receive this vaccine at local pharmacy or Health Dept. Aware to provide a copy of the vaccination record if obtained from local pharmacy or Health Dept.  Verbalized acceptance and understanding.  Flu Vaccine status: Up to date  Pneumococcal vaccine status: Up to date  Covid-19 vaccine status: Declined, Education has been provided regarding the importance of this vaccine but patient still declined. Advised may receive this vaccine at local pharmacy or Health Dept.or vaccine clinic. Aware to provide a copy of the vaccination record if obtained from local pharmacy or Health Dept. Verbalized acceptance and understanding.  Qualifies for Shingles Vaccine? Yes   Zostavax completed Yes   Shingrix  Completed?: Yes  Screening Tests Health Maintenance  Topic Date Due   DTaP/Tdap/Td (2 - Td or Tdap) 03/13/2023   COVID-19 Vaccine (6 - 2024-25 season) 10/03/2023   Lung Cancer Screening  11/21/2024   Medicare Annual Wellness (AWV)  12/20/2024   MAMMOGRAM  08/13/2025   Colonoscopy  10/26/2028   Pneumonia Vaccine 69+ Years old  Completed   INFLUENZA VACCINE  Completed   DEXA SCAN  Completed   Hepatitis C Screening  Completed   Zoster Vaccines- Shingrix   Completed   HPV VACCINES  Aged Out    Health Maintenance  Health Maintenance Due  Topic Date Due   DTaP/Tdap/Td (2 - Td or Tdap) 03/13/2023   COVID-19 Vaccine (6 - 2024-25 season) 10/03/2023  Colorectal cancer screening: Type of screening: Colonoscopy. Completed 10/27/23. Repeat every 5 years  Mammogram status: Completed 08/14/23. Repeat every year  Bone Density status: Completed 05/25/20. Results reflect: Bone density results: NORMAL. Repeat every   years.    Additional Screening:  Hepatitis C Screening: does qualify; Completed 02/02/23  Vision Screening: Recommended annual ophthalmology exams for early detection of glaucoma and other disorders of the eye. Is the patient up to date with their annual eye exam?  Yes  Who is the provider or what is the name of the office in which the patient attends annual eye exams? [facility] If pt is not established with a provider, would they  like to be referred to a provider to establish care? No .   Dental Screening: Recommended annual dental exams for proper oral hygiene   Community Resource Referral / Chronic Care Management:  CRR required this visit?  No   CCM required this visit?  No     Plan:     I have personally reviewed and noted the following in the patient's chart:   Medical and social history Use of alcohol, tobacco or illicit drugs  Current medications and supplements including opioid prescriptions. Patient is currently taking opioid prescriptions. Information provided to patient regarding non-opioid alternatives. Patient advised to discuss non-opioid treatment plan with their provider. Functional ability and status Nutritional status Physical activity Advanced directives List of other physicians Hospitalizations, surgeries, and ER visits in previous 12 months Vitals Screenings to include cognitive, depression, and falls Referrals and appointments  In addition, I have reviewed and discussed with patient certain preventive protocols, quality metrics, and best practice recommendations. A written personalized care plan for preventive services as well as general preventive health recommendations were provided to patient.     Rojelio LELON Blush, LPN   05/16/7973   After Visit Summary: (MyChart) Due to this being a telephonic visit, the after visit summary with patients personalized plan was offered to patient via MyChart   Nurse Notes: None

## 2023-12-21 NOTE — Patient Instructions (Addendum)
 Teresa Morgan , Thank you for taking time to come for your Medicare Wellness Visit. I appreciate your ongoing commitment to your health goals. Please review the following plan we discussed and let me know if I can assist you in the future.   Referrals/Orders/Follow-Ups/Clinician Recommendations:   This is a list of the screening recommended for you and due dates:  Health Maintenance  Topic Date Due   DTaP/Tdap/Td vaccine (2 - Td or Tdap) 03/13/2023   COVID-19 Vaccine (6 - 2024-25 season) 10/03/2023   Screening for Lung Cancer  11/21/2024   Medicare Annual Wellness Visit  12/20/2024   Mammogram  08/13/2025   Colon Cancer Screening  10/26/2028   Pneumonia Vaccine  Completed   Flu Shot  Completed   DEXA scan (bone density measurement)  Completed   Hepatitis C Screening  Completed   Zoster (Shingles) Vaccine  Completed   HPV Vaccine  Aged Out   Opioid Pain Medicine Management Opioids are powerful medicines that are used to treat moderate to severe pain. When used for short periods of time, they can help you to: Sleep better. Do better in physical or occupational therapy. Feel better in the first few days after an injury. Recover from surgery. Opioids should be taken with the supervision of a trained health care provider. They should be taken for the shortest period of time possible. This is because opioids can be addictive, and the longer you take opioids, the greater your risk of addiction. This addiction can also be called opioid use disorder. What are the risks? Using opioid pain medicines for longer than 3 days increases your risk of side effects. Side effects include: Constipation. Nausea and vomiting. Breathing difficulties (respiratory depression). Drowsiness. Confusion. Opioid use disorder. Itching. Taking opioid pain medicine for a long period of time can affect your ability to do daily tasks. It also puts you at risk for: Motor vehicle crashes. Depression. Suicide. Heart  attack. Overdose, which can be life-threatening. What is a pain treatment plan? A pain treatment plan is an agreement between you and your health care provider. Pain is unique to each person, and treatments vary depending on your condition. To manage your pain, you and your health care provider need to work together. To help you do this: Discuss the goals of your treatment, including how much pain you might expect to have and how you will manage the pain. Review the risks and benefits of taking opioid medicines. Remember that a good treatment plan uses more than one approach and minimizes the chance of side effects. Be honest about the amount of medicines you take and about any drug or alcohol use. Get pain medicine prescriptions from only one health care provider. Pain can be managed with many types of alternative treatments. Ask your health care provider to refer you to one or more specialists who can help you manage pain through: Physical or occupational therapy. Counseling (cognitive behavioral therapy). Good nutrition. Biofeedback. Massage. Meditation. Non-opioid medicine. Following a gentle exercise program. How to use opioid pain medicine Taking medicine Take your pain medicine exactly as told by your health care provider. Take it only when you need it. If your pain gets less severe, you may take less than your prescribed dose if your health care provider approves. If you are not having pain, do nottake pain medicine unless your health care provider tells you to take it. If your pain is severe, do nottry to treat it yourself by taking more pills than instructed on your  prescription. Contact your health care provider for help. Write down the times when you take your pain medicine. It is easy to become confused while on pain medicine. Writing the time can help you avoid overdose. Take other over-the-counter or prescription medicines only as told by your health care provider. Keeping  yourself and others safe  While you are taking opioid pain medicine: Do not drive, use machinery, or power tools. Do not sign legal documents. Do not drink alcohol. Do not take sleeping pills. Do not supervise children by yourself. Do not do activities that require climbing or being in high places. Do not go to a lake, river, ocean, spa, or swimming pool. Do not share your pain medicine with anyone. Keep pain medicine in a locked cabinet or in a secure area where pets and children cannot reach it. Stopping your use of opioids If you have been taking opioid medicine for more than a few weeks, you may need to slowly decrease (taper) how much you take until you stop completely. Tapering your use of opioids can decrease your risk of symptoms of withdrawal, such as: Pain and cramping in the abdomen. Nausea. Sweating. Sleepiness. Restlessness. Uncontrollable shaking (tremors). Cravings for the medicine. Do not attempt to taper your use of opioids on your own. Talk with your health care provider about how to do this. Your health care provider may prescribe a step-down schedule based on how much medicine you are taking and how long you have been taking it. Getting rid of leftover pills Do not save any leftover pills. Get rid of leftover pills safely by: Taking the medicine to a prescription take-back program. This is usually offered by the county or law enforcement. Bringing them to a pharmacy that has a drug disposal container. Flushing them down the toilet. Check the label or package insert of your medicine to see whether this is safe to do. Throwing them out in the trash. Check the label or package insert of your medicine to see whether this is safe to do. If it is safe to throw it out, remove the medicine from the original container, put it into a sealable bag or container, and mix it with used coffee grounds, food scraps, dirt, or cat litter before putting it in the trash. Follow these  instructions at home: Activity Do exercises as told by your health care provider. Avoid activities that make your pain worse. Return to your normal activities as told by your health care provider. Ask your health care provider what activities are safe for you. General instructions You may need to take these actions to prevent or treat constipation: Drink enough fluid to keep your urine pale yellow. Take over-the-counter or prescription medicines. Eat foods that are high in fiber, such as beans, whole grains, and fresh fruits and vegetables. Limit foods that are high in fat and processed sugars, such as fried or sweet foods. Keep all follow-up visits. This is important. Where to find support If you have been taking opioids for a long time, you may benefit from receiving support for quitting from a local support group or counselor. Ask your health care provider for a referral to these resources in your area. Where to find more information Centers for Disease Control and Prevention (CDC): footballexhibition.com.br [place] Food and Drug Administration (FDA): pumpkinsearch.com.ee Get help right away if: You may have taken too much of an opioid (overdosed). Common symptoms of an overdose: Your breathing is slower or more shallow than normal. You have a very  slow heartbeat (pulse). You have slurred speech. You have nausea and vomiting. Your pupils become very small. You have other potential symptoms: You are very confused. You faint or feel like you will faint. You have cold, clammy skin. You have blue lips or fingernails. You have thoughts of harming yourself or harming others. These symptoms may represent a serious problem that is an emergency. Do not wait to see if the symptoms will go away. Get medical help right away. Call your local emergency services (911 in the [place] Do not drive yourself to the hospital.  If you ever feel like you may hurt yourself or others, or have thoughts about taking your own life, get  help right away. Go to your nearest emergency department or: Call your local emergency services (911 in the [place] Call the [facility] (307-435-2858 in the [place] Call a suicide crisis helpline, such as the National Suicide Prevention Lifeline at (252) 316-1975 or 988 in the [place] This is open 24 hours a day in the [place] If you're a Veteran: Call 988 and press 1. This is open 24 hours a day. Text the [employer] at (401)677-8891. Summary Opioid medicines can help you manage moderate to severe pain for a short period of time. A pain treatment plan is an agreement between you and your health care provider. Discuss the goals of your treatment, including how much pain you might expect to have and how you will manage the pain. If you think that you or someone else may have taken too much of an opioid, get medical help right away. This information is not intended to replace advice given to you by your health care provider. Make sure you discuss any questions you have with your health care provider. Document Revised: 08/07/2023 Document Reviewed: 02/10/2021 Elsevier Patient Education  2024 [employer]. Advanced directives: (Provided) Advance directive discussed with you today. I have provided a copy for you to complete at home and have notarized. Once this is complete, please bring a copy in to our office so we can scan it into your chart.   Next Medicare Annual Wellness Visit scheduled for next year:

## 2023-12-21 NOTE — Telephone Encounter (Signed)
 Patient had a virtual visit and missed the call she need a call back

## 2023-12-21 NOTE — Telephone Encounter (Signed)
 Looks like AWV was completed

## 2023-12-27 ENCOUNTER — Other Ambulatory Visit (HOSPITAL_COMMUNITY): Payer: Self-pay

## 2023-12-27 DIAGNOSIS — R7303 Prediabetes: Secondary | ICD-10-CM | POA: Diagnosis not present

## 2023-12-27 DIAGNOSIS — R030 Elevated blood-pressure reading, without diagnosis of hypertension: Secondary | ICD-10-CM | POA: Diagnosis not present

## 2023-12-27 DIAGNOSIS — Z0130 Encounter for examination of blood pressure without abnormal findings: Secondary | ICD-10-CM | POA: Diagnosis not present

## 2023-12-27 DIAGNOSIS — Z79899 Other long term (current) drug therapy: Secondary | ICD-10-CM | POA: Diagnosis not present

## 2023-12-27 DIAGNOSIS — F17210 Nicotine dependence, cigarettes, uncomplicated: Secondary | ICD-10-CM | POA: Diagnosis not present

## 2023-12-27 DIAGNOSIS — Z789 Other specified health status: Secondary | ICD-10-CM | POA: Diagnosis not present

## 2023-12-27 DIAGNOSIS — E559 Vitamin D deficiency, unspecified: Secondary | ICD-10-CM | POA: Diagnosis not present

## 2023-12-27 DIAGNOSIS — Z6821 Body mass index (BMI) 21.0-21.9, adult: Secondary | ICD-10-CM | POA: Diagnosis not present

## 2023-12-27 MED ORDER — OXYCODONE-ACETAMINOPHEN 10-325 MG PO TABS
0.5000 | ORAL_TABLET | Freq: Four times a day (QID) | ORAL | 0 refills | Status: DC
Start: 2023-12-27 — End: 2024-01-25
  Filled 2023-12-27: qty 105, 30d supply, fill #0

## 2023-12-28 ENCOUNTER — Telehealth: Payer: Self-pay | Admitting: Family

## 2023-12-28 NOTE — Telephone Encounter (Signed)
Copied from CRM [id number]. Topic: General - Billing Inquiry >> Dec 28, 2023  3:30 PM Deaijah H wrote: Reason for CRM: Patient called in stating her co-pay for her MRI appointment is $220 wants to know if her inusrance Johnette Abraham was also ran for that / please call [phone]

## 2023-12-29 ENCOUNTER — Telehealth: Payer: Self-pay | Admitting: Family

## 2023-12-29 NOTE — Telephone Encounter (Signed)
Copied from CRM [id number]. Topic: General - Other >> Dec 29, 2023  2:39 PM Taleah C wrote: Reason for CRM: pt called and stated that she received a message from the Imaging department stating that when [facility] ordered a MRI for her, the order wasn't ran by her her insurance [employer]. She asking for that to be completed prior to her appointment this Sunday or she will have to pay $200 out-of-pocket. Please call and advise.

## 2023-12-29 NOTE — Telephone Encounter (Signed)
Per patient she received a call from [facility] to let her know she will not have a co-pay

## 2023-12-31 ENCOUNTER — Ambulatory Visit (HOSPITAL_BASED_OUTPATIENT_CLINIC_OR_DEPARTMENT_OTHER)
Admission: RE | Admit: 2023-12-31 | Discharge: 2023-12-31 | Disposition: A | Discharge status: Home / Self Care | Payer: Medicare Other | Source: Ambulatory Visit | Attending: Family | Admitting: Family

## 2023-12-31 DIAGNOSIS — R160 Hepatomegaly, not elsewhere classified: Secondary | ICD-10-CM | POA: Insufficient documentation

## 2023-12-31 DIAGNOSIS — I700 Atherosclerosis of aorta: Secondary | ICD-10-CM | POA: Diagnosis not present

## 2023-12-31 DIAGNOSIS — K769 Liver disease, unspecified: Secondary | ICD-10-CM | POA: Insufficient documentation

## 2023-12-31 MED ORDER — GADOBUTROL 1 MMOL/ML IV SOLN
6.5000 mL | Freq: Once | INTRAVENOUS | Status: AC | PRN
  Administered 2023-12-31: 6.5 mL via INTRAVENOUS

## 2024-01-01 ENCOUNTER — Encounter: Payer: Self-pay | Admitting: Family

## 2024-01-08 ENCOUNTER — Other Ambulatory Visit: Payer: Self-pay | Admitting: Hematology & Oncology

## 2024-01-08 DIAGNOSIS — C180 Malignant neoplasm of cecum: Secondary | ICD-10-CM

## 2024-01-08 DIAGNOSIS — J430 Unilateral pulmonary emphysema [MacLeod's syndrome]: Secondary | ICD-10-CM

## 2024-01-08 DIAGNOSIS — G629 Polyneuropathy, unspecified: Secondary | ICD-10-CM

## 2024-01-21 ENCOUNTER — Other Ambulatory Visit: Payer: Self-pay | Admitting: Family

## 2024-01-24 ENCOUNTER — Other Ambulatory Visit (HOSPITAL_COMMUNITY): Payer: Self-pay

## 2024-01-24 DIAGNOSIS — R7303 Prediabetes: Secondary | ICD-10-CM | POA: Diagnosis not present

## 2024-01-24 DIAGNOSIS — E559 Vitamin D deficiency, unspecified: Secondary | ICD-10-CM | POA: Diagnosis not present

## 2024-01-24 DIAGNOSIS — F17210 Nicotine dependence, cigarettes, uncomplicated: Secondary | ICD-10-CM | POA: Diagnosis not present

## 2024-01-25 ENCOUNTER — Other Ambulatory Visit (HOSPITAL_COMMUNITY): Payer: Self-pay

## 2024-01-25 MED ORDER — OXYCODONE-ACETAMINOPHEN 10-325 MG PO TABS
ORAL_TABLET | Freq: Four times a day (QID) | ORAL | 0 refills | Status: DC
  Filled 2024-01-25: qty 105, 30d supply, fill #0

## 2024-01-26 ENCOUNTER — Other Ambulatory Visit (HOSPITAL_COMMUNITY): Payer: Self-pay

## 2024-02-05 ENCOUNTER — Other Ambulatory Visit: Payer: Self-pay | Admitting: Hematology & Oncology

## 2024-02-05 DIAGNOSIS — J430 Unilateral pulmonary emphysema [MacLeod's syndrome]: Secondary | ICD-10-CM

## 2024-02-05 DIAGNOSIS — C180 Malignant neoplasm of cecum: Secondary | ICD-10-CM

## 2024-02-05 DIAGNOSIS — G629 Polyneuropathy, unspecified: Secondary | ICD-10-CM

## 2024-02-17 ENCOUNTER — Other Ambulatory Visit: Payer: Self-pay | Admitting: Family

## 2024-02-19 ENCOUNTER — Other Ambulatory Visit (HOSPITAL_BASED_OUTPATIENT_CLINIC_OR_DEPARTMENT_OTHER): Payer: Self-pay

## 2024-02-23 ENCOUNTER — Other Ambulatory Visit (HOSPITAL_COMMUNITY): Payer: Self-pay

## 2024-02-23 ENCOUNTER — Telehealth: Payer: Self-pay | Admitting: Neurology

## 2024-02-23 ENCOUNTER — Telehealth: Payer: Self-pay

## 2024-02-23 ENCOUNTER — Other Ambulatory Visit (HOSPITAL_BASED_OUTPATIENT_CLINIC_OR_DEPARTMENT_OTHER): Payer: Self-pay

## 2024-02-23 ENCOUNTER — Telehealth: Payer: Self-pay | Admitting: Family

## 2024-02-23 DIAGNOSIS — M545 Low back pain, unspecified: Secondary | ICD-10-CM | POA: Diagnosis not present

## 2024-02-23 DIAGNOSIS — E559 Vitamin D deficiency, unspecified: Secondary | ICD-10-CM | POA: Diagnosis not present

## 2024-02-23 DIAGNOSIS — R7303 Prediabetes: Secondary | ICD-10-CM | POA: Diagnosis not present

## 2024-02-23 DIAGNOSIS — Z789 Other specified health status: Secondary | ICD-10-CM | POA: Diagnosis not present

## 2024-02-23 DIAGNOSIS — Z6821 Body mass index (BMI) 21.0-21.9, adult: Secondary | ICD-10-CM | POA: Diagnosis not present

## 2024-02-23 DIAGNOSIS — R030 Elevated blood-pressure reading, without diagnosis of hypertension: Secondary | ICD-10-CM | POA: Diagnosis not present

## 2024-02-23 DIAGNOSIS — F17210 Nicotine dependence, cigarettes, uncomplicated: Secondary | ICD-10-CM | POA: Diagnosis not present

## 2024-02-23 MED ORDER — OXYCODONE-ACETAMINOPHEN 10-325 MG PO TABS
1.0000 | ORAL_TABLET | Freq: Every day | ORAL | 0 refills | Status: AC
  Filled 2024-02-23: qty 105, 30d supply, fill #0

## 2024-02-23 MED ORDER — VORTIOXETINE HBR 5 MG PO TABS
5.0000 mg | ORAL_TABLET | Freq: Every day | ORAL | 0 refills | Status: DC
  Filled 2024-02-23: qty 30, 30d supply, fill #0

## 2024-02-23 NOTE — Telephone Encounter (Signed)
 Patient called, left VM to return the call to the office to speak to NT.  If she calls back please inform her that her prescription has been sent to [facility]   Copied from CRM [id number]. Topic: Clinical - Prescription Issue >> Feb 23, 2024  2:07 PM Florestine Avers wrote: Reason for CRM: Patient called stating that she is waiting for her prescription to be refilled TRINTELLIX 5 MG TABS tablet. Patient is aware that it takes 3 bushiness days for the refill request to be fulfilled. Patient stated she was going out of town, and she's near her pharmacy and was hoping it could be sent now.

## 2024-02-23 NOTE — Telephone Encounter (Signed)
 Rx sent

## 2024-02-23 NOTE — Telephone Encounter (Signed)
 Copied from CRM [id number]. Topic: Clinical - Medication Refill >> Feb 23, 2024 12:06 PM Darl Householder wrote: Most Recent Primary Care Visit:  Provider: Tillie Rung  Department: [facility]  Visit Type: MEDICARE AWV, SEQUENTIAL  Date: 12/21/2023  Medication: TRINTELLIX 5 MG TABS tablet  Has the patient contacted their pharmacy? Yes (Agent: If no, request that the patient contact the pharmacy for the refill. If patient does not wish to contact the pharmacy document the reason why and proceed with request.) (Agent: If yes, when and what did the pharmacy advise?)  Is this the correct pharmacy for this prescription? Yes If no, delete pharmacy and type the correct one.  This is the patient's preferred pharmacy:  [facility] [place] [facility] [address] [place] [address] Phone: [phone] Fax: [phone]  [facility] [address] [place]), [place] - [address] [place] [address] Phone: [phone] Fax: [phone]  [facility] - [place], [place] - [address] [place] [address] Phone: [phone] Fax: [phone]  [id number] [place], [place] - [address] AT [facility] [address] [place] [address] Phone: [phone] Fax: [phone]   Has the prescription been filled recently? No  Is the patient out of the medication? Yes  Has the patient been seen for an appointment in the last year OR does the patient have an upcoming appointment? Yes  Can we respond through MyChart? Yes  Agent: Please be advised that Rx refills may take up to 3 business days. We ask that you follow-up with your pharmacy.

## 2024-02-23 NOTE — Telephone Encounter (Signed)
 Copied from CRM [id number]. Topic: Clinical - Lab/Test Results >> Feb 23, 2024  2:09 PM Florestine Avers wrote: Reason for CRM: Patient wanted to know if she was up to date on her vaccines. Patient is requesting a call back.

## 2024-02-24 NOTE — Telephone Encounter (Signed)
 She is due for tetanus, covid booster and RSV (if she has not yet received it), all can be given at her pharmacy.

## 2024-02-26 ENCOUNTER — Other Ambulatory Visit (HOSPITAL_BASED_OUTPATIENT_CLINIC_OR_DEPARTMENT_OTHER): Payer: Self-pay

## 2024-02-26 NOTE — Telephone Encounter (Signed)
 Patient notified of this information.

## 2024-03-06 ENCOUNTER — Other Ambulatory Visit: Payer: Self-pay | Admitting: Hematology & Oncology

## 2024-03-06 DIAGNOSIS — G629 Polyneuropathy, unspecified: Secondary | ICD-10-CM

## 2024-03-06 DIAGNOSIS — J430 Unilateral pulmonary emphysema [MacLeod's syndrome]: Secondary | ICD-10-CM

## 2024-03-06 DIAGNOSIS — C180 Malignant neoplasm of cecum: Secondary | ICD-10-CM

## 2024-03-22 ENCOUNTER — Other Ambulatory Visit (HOSPITAL_COMMUNITY): Payer: Self-pay

## 2024-03-22 DIAGNOSIS — Z6821 Body mass index (BMI) 21.0-21.9, adult: Secondary | ICD-10-CM | POA: Diagnosis not present

## 2024-03-22 DIAGNOSIS — Z789 Other specified health status: Secondary | ICD-10-CM | POA: Diagnosis not present

## 2024-03-22 DIAGNOSIS — Z79899 Other long term (current) drug therapy: Secondary | ICD-10-CM | POA: Diagnosis not present

## 2024-03-22 DIAGNOSIS — R7303 Prediabetes: Secondary | ICD-10-CM | POA: Diagnosis not present

## 2024-03-22 DIAGNOSIS — M545 Low back pain, unspecified: Secondary | ICD-10-CM | POA: Diagnosis not present

## 2024-03-22 DIAGNOSIS — F17210 Nicotine dependence, cigarettes, uncomplicated: Secondary | ICD-10-CM | POA: Diagnosis not present

## 2024-03-22 DIAGNOSIS — E559 Vitamin D deficiency, unspecified: Secondary | ICD-10-CM | POA: Diagnosis not present

## 2024-03-22 MED ORDER — OXYCODONE-ACETAMINOPHEN 10-325 MG PO TABS
1.0000 | ORAL_TABLET | Freq: Every day | ORAL | 0 refills | Status: DC | PRN
  Filled 2024-03-25: qty 14, 4d supply, fill #0

## 2024-03-23 ENCOUNTER — Other Ambulatory Visit: Payer: Self-pay | Admitting: Family

## 2024-03-25 ENCOUNTER — Other Ambulatory Visit: Payer: Self-pay

## 2024-03-25 ENCOUNTER — Other Ambulatory Visit (HOSPITAL_COMMUNITY): Payer: Self-pay

## 2024-03-25 DIAGNOSIS — E559 Vitamin D deficiency, unspecified: Secondary | ICD-10-CM | POA: Diagnosis not present

## 2024-03-25 DIAGNOSIS — M545 Low back pain, unspecified: Secondary | ICD-10-CM | POA: Diagnosis not present

## 2024-03-25 DIAGNOSIS — R030 Elevated blood-pressure reading, without diagnosis of hypertension: Secondary | ICD-10-CM | POA: Diagnosis not present

## 2024-03-25 DIAGNOSIS — F17210 Nicotine dependence, cigarettes, uncomplicated: Secondary | ICD-10-CM | POA: Diagnosis not present

## 2024-03-25 DIAGNOSIS — Z789 Other specified health status: Secondary | ICD-10-CM | POA: Diagnosis not present

## 2024-03-25 DIAGNOSIS — Z6821 Body mass index (BMI) 21.0-21.9, adult: Secondary | ICD-10-CM | POA: Diagnosis not present

## 2024-03-25 DIAGNOSIS — R7303 Prediabetes: Secondary | ICD-10-CM | POA: Diagnosis not present

## 2024-03-25 MED ORDER — OXYCODONE-ACETAMINOPHEN 10-325 MG PO TABS
ORAL_TABLET | ORAL | 0 refills | Status: DC
  Filled 2024-03-25 – 2024-03-28 (×2): qty 105, 30d supply, fill #0

## 2024-03-25 MED ORDER — OXYCODONE-ACETAMINOPHEN 10-325 MG PO TABS
1.0000 | ORAL_TABLET | Freq: Every day | ORAL | 0 refills | Status: DC
  Filled 2024-03-25: qty 105, 105d supply, fill #0
  Filled ????-??-??: fill #0

## 2024-03-25 NOTE — Telephone Encounter (Signed)
 Patient was scheduled for 04/02/24

## 2024-03-25 NOTE — Telephone Encounter (Signed)
 Please contact pt to schedule a follow up appointment.

## 2024-03-28 ENCOUNTER — Other Ambulatory Visit (HOSPITAL_COMMUNITY): Payer: Self-pay

## 2024-03-28 DIAGNOSIS — Z79899 Other long term (current) drug therapy: Secondary | ICD-10-CM | POA: Diagnosis not present

## 2024-04-02 ENCOUNTER — Other Ambulatory Visit (HOSPITAL_BASED_OUTPATIENT_CLINIC_OR_DEPARTMENT_OTHER): Payer: Self-pay

## 2024-04-02 ENCOUNTER — Other Ambulatory Visit (HOSPITAL_COMMUNITY): Payer: Self-pay

## 2024-04-02 ENCOUNTER — Ambulatory Visit (INDEPENDENT_AMBULATORY_CARE_PROVIDER_SITE_OTHER): Admitting: Family

## 2024-04-02 ENCOUNTER — Other Ambulatory Visit: Payer: Self-pay

## 2024-04-02 VITALS — BP 112/68 | HR 96 | Temp 98.2°F | Resp 16 | Ht 68.0 in | Wt 141.0 lb

## 2024-04-02 DIAGNOSIS — G629 Polyneuropathy, unspecified: Secondary | ICD-10-CM

## 2024-04-02 DIAGNOSIS — R7303 Prediabetes: Secondary | ICD-10-CM | POA: Diagnosis not present

## 2024-04-02 DIAGNOSIS — J439 Emphysema, unspecified: Secondary | ICD-10-CM | POA: Diagnosis not present

## 2024-04-02 DIAGNOSIS — K219 Gastro-esophageal reflux disease without esophagitis: Secondary | ICD-10-CM | POA: Diagnosis not present

## 2024-04-02 DIAGNOSIS — E785 Hyperlipidemia, unspecified: Secondary | ICD-10-CM | POA: Diagnosis not present

## 2024-04-02 DIAGNOSIS — W57XXXA Bitten or stung by nonvenomous insect and other nonvenomous arthropods, initial encounter: Secondary | ICD-10-CM | POA: Diagnosis not present

## 2024-04-02 DIAGNOSIS — D500 Iron deficiency anemia secondary to blood loss (chronic): Secondary | ICD-10-CM

## 2024-04-02 LAB — CBC WITH DIFFERENTIAL/PLATELET
Basophils Absolute: 0 10*3/uL (ref 0.0–0.1)
Basophils Relative: 0.3 % (ref 0.0–3.0)
Eosinophils Absolute: 0.1 10*3/uL (ref 0.0–0.7)
Eosinophils Relative: 1.4 % (ref 0.0–5.0)
HCT: 41.1 % (ref 36.0–46.0)
Hemoglobin: 13.2 g/dL (ref 12.0–15.0)
Lymphocytes Relative: 29.5 % (ref 12.0–46.0)
Lymphs Abs: 2 10*3/uL (ref 0.7–4.0)
MCHC: 32.1 g/dL (ref 30.0–36.0)
MCV: 73.7 fl — ABNORMAL LOW (ref 78.0–100.0)
Monocytes Absolute: 0.2 10*3/uL (ref 0.1–1.0)
Monocytes Relative: 3.6 % (ref 3.0–12.0)
Neutro Abs: 4.5 10*3/uL (ref 1.4–7.7)
Neutrophils Relative %: 65.2 % (ref 43.0–77.0)
Platelets: 138 10*3/uL — ABNORMAL LOW (ref 150.0–400.0)
RBC: 5.57 Mil/uL — ABNORMAL HIGH (ref 3.87–5.11)
RDW: 15.5 % (ref 11.5–15.5)
WBC: 6.9 10*3/uL (ref 4.0–10.5)

## 2024-04-02 LAB — VITAMIN B12: Vitamin B-12: 569 pg/mL (ref 211–911)

## 2024-04-02 LAB — HEMOGLOBIN A1C: Hgb A1c MFr Bld: 6.1 % (ref 4.6–6.5)

## 2024-04-02 MED ORDER — EPINEPHRINE 0.3 MG/0.3ML IJ SOAJ: 0.3000 mg | INTRAMUSCULAR | 0 refills | Status: AC | PRN

## 2024-04-02 MED ORDER — BETAMETHASONE DIPROPIONATE 0.05 % EX CREA
TOPICAL_CREAM | Freq: Two times a day (BID) | CUTANEOUS | 0 refills | Status: AC
  Filled 2024-04-02 (×2): qty 30, 30d supply, fill #0

## 2024-04-02 NOTE — Assessment & Plan Note (Signed)
 Managed by pain clinic. Stable with percocet.

## 2024-04-02 NOTE — Assessment & Plan Note (Addendum)
 Lab Results  Component Value Date   HGBA1C 6.0 08/08/2023   Update A1C.  Continue DM diet efforts.

## 2024-04-02 NOTE — Assessment & Plan Note (Addendum)
 Uses wixela one to two times a day.  Reports symptoms are stable.

## 2024-04-02 NOTE — Assessment & Plan Note (Addendum)
 Lab Results  Component Value Date   CHOL 120 08/08/2023   HDL 44.60 08/08/2023   LDLCALC 54 08/08/2023   LDLDIRECT 75.0 06/17/2022   TRIG 111.0 08/08/2023   CHOLHDL 3 08/08/2023   Not currently on statin. Lipid panel stable.

## 2024-04-02 NOTE — Assessment & Plan Note (Signed)
Stable without daily medication.

## 2024-04-02 NOTE — Progress Notes (Signed)
 Subjective:     Patient ID: Teresa Morgan, female    DOB: 10-04-1956, 68 y.o.   MRN: [id number]  Chief Complaint  Patient presents with   Anxiety    Here for follow up   Depression    Here for follow up    HPI  Discussed the use of AI scribe software for clinical note transcription with the patient, who gave verbal consent to proceed.  History of Present Illness  Teresa Morgan is a 68 year old female who presents for a routine follow-up visit.  She experiences intermittent neuropathy, leading to falls due to lack of sensation in her feet. She uses home rails to prevent falls and attributes her neuropathy to previous chemotherapy. Her mother has Sjogren's disease, and her daughter also experiences neuropathy.  Her gastroesophageal reflux disease is stable, using Lantus as needed for symptom control.  She uses an albuterol  inhaler twice daily but often forgets the second dose. She suspects mold and mildew in her home contribute to her respiratory symptoms, experiencing coughing when moving between floors with different temperatures.  Back pain varies with activity and is managed with Percocet prescribed by a pain clinic.  She has experienced local swelling from spider bites affecting her lips and body. She previously used an EpiPen  and is considering obtaining a new one. She also uses a cream for bites, which she plans to pick up from the pharmacy.  Her blood count and A1c were last checked in September, with the A1c in the borderline range. She regularly consumes [facility], which may affect her A1c levels. Her B12 level was previously in the low normal range, and she is considering supplementation if necessary.     Health Maintenance Due  Topic Date Due   DTaP/Tdap/Td (2 - Td or Tdap) 03/13/2023   COVID-19 Vaccine (6 - Moderna risk 2024-25 season) 02/05/2024    Past Medical History:  Diagnosis Date   Anemia    Anxiety    Arthritis    Borderline diabetes    Cataract     bilat removed   Colon cancer (HCC) 2013   cecal cancer   COPD with emphysema (HCC) 06/18/2012   Depression    GERD (gastroesophageal reflux disease)    Hyperlipidemia 05/27/2021   Neuromuscular disorder (HCC)    neuropathy from chemo   Peptic ulcer    Pneumonia    Shortness of breath dyspnea    with exertion    Past Surgical History:  Procedure Laterality Date   APPENDECTOMY  07/07/11   BREAST EXCISIONAL BIOPSY Right 03/15/2018   COLON RESECTION  2012   for colon cancer   COLONOSCOPY     EYE SURGERY Bilateral    cataract surgery with lens implant   heel spurs Right    hemrrhoids     PLANTAR FASCIA SURGERY Right 2009   PORT-A-CATH REMOVAL Left 04/19/2016   Procedure: REMOVAL PORT-A-CATH;  Surgeon: Lockie Rima, MD;  Location: [facility] OR;  Service: General;  Laterality: Left;   portacath     RADIOACTIVE SEED GUIDED EXCISIONAL BREAST BIOPSY Right 03/15/2018   Procedure: RADIOACTIVE SEED GUIDED EXCISIONAL BREAST BIOPSY;  Surgeon: Lockie Rima, MD;  Location: [facility];  Service: General;  Laterality: Right;   TUBAL LIGATION  07/1980    Family History  Problem Relation Age of Onset   Diabetes Mother    Colon cancer Cousin    Colon cancer Cousin    Thalassemia Daughter    Colon polyps  Neg Hx    Rectal cancer Neg Hx    Stomach cancer Neg Hx     Social History   Socioeconomic History   Marital status: Married    Spouse name: Not on file   Number of children: 3   Years of education: Not on file   Highest education level: Some college, no degree  Occupational History   Occupation: Marine scientist: [employer]  Tobacco Use   Smoking status: Every Day    Current packs/day: 0.00    Average packs/day: 1 pack/day for 45.0 years (45.0 ttl pk-yrs)    Types: Cigarettes    Start date: 04/23/1986    Last attempt to quit: 09/04/2023    Years since quitting: 0.5   Smokeless tobacco: Never   Tobacco comments:    Quit smoking 1 week ago.  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   Married      Social Drivers of Corporate investment banker Strain: Patient Declined (03/29/2024)   Overall Financial Resource Strain (CARDIA)    Difficulty of Paying Living Expenses: Patient declined  Food Insecurity: Unknown (03/29/2024)   Hunger Vital Sign    Worried About Running Out of Food in the Last Year: Patient declined    Ran Out of Food in the Last Year: Never true  Transportation Needs: No Transportation Needs (03/29/2024)   PRAPARE - Administrator, Civil Service (Medical): No    Lack of Transportation (Non-Medical): No  Physical Activity: Sufficiently Active (03/29/2024)   Exercise Vital Sign    Days of Exercise per Week: 5 days    Minutes of Exercise per Session: 120 min  Stress: No Stress Concern Present (03/29/2024)   [employer] of Occupational Health - Occupational Stress Questionnaire    Feeling of Stress : Only a little  Social Connections: Socially Integrated (03/29/2024)   Social Connection and Isolation Panel [NHANES]    Frequency of Communication with Friends and Family: Patient declined    Frequency of Social Gatherings with Friends and Family: More than three times a week    Attends Religious Services: More than 4 times per year    Active Member of [employer] or Organizations: No    Attends Engineer, structural: More than 4 times per year    Marital Status: Married  Catering manager Violence: Not At Risk (12/21/2023)   Humiliation, Afraid, Rape, and Kick questionnaire    Fear of Current or Ex-Partner: No    Emotionally Abused: No    Physically  Abused: No    Sexually Abused: No    Outpatient Medications Prior to Visit  Medication Sig Dispense Refill   ALPRAZolam  (XANAX ) 1 MG tablet TAKE 1 TABLET BY MOUTH EVERY DAY 30 tablet 0   atorvastatin  (LIPITOR) 20 MG tablet TAKE 1 TABLET(20 MG) BY MOUTH DAILY 90 tablet 0   Calcium -Vitamin D 600-125 MG-UNIT TABS Take 1 tablet by mouth daily.     fluticasone -salmeterol (WIXELA INHUB) 250-50 MCG/ACT AEPB Inhale 1 puff into the lungs in the  morning and at bedtime.     Multiple Vitamin (MULTIVITAMIN) tablet Take 1 tablet by mouth daily.     nicotine  (NICOTROL ) 10 MG inhaler Inhale 1 Cartridge (1 continuous puffing total) into the lungs as needed for smoking cessation. 150 each 0   oxyCODONE -acetaminophen  (PERCOCET) 10-325 MG tablet Take 1 tablet by mouth 4 (four) times daily as needed. 120 tablet 0   oxyCODONE -acetaminophen  (PERCOCET) 10-325 MG tablet Take 1 tablet by mouth 4 (four) times daily as needed. 110 tablet 0   oxyCODONE -acetaminophen  (PERCOCET) 10-325 MG tablet Take 1 tablet by mouth as directed. Take 0.5 to 1 tablet by mouth no more than four times daily. Max of 3.5 tablets daily 105 tablet 0   oxyCODONE -acetaminophen  (PERCOCET) 10-325 MG tablet Take 1/2 to 1 tablet by mouth up to 4 times daily for lower back pain as directed. Max daily dose of 3 and 1/2 tablets by mouth daily 105 tablet 0   TRINTELLIX  5 MG TABS tablet TAKE 1 TABLET BY MOUTH EVERY DAY 30 tablet 0   TURMERIC PO Take 1 tablet by mouth daily.     vitamin E 200 UNIT capsule Take 200 Units by mouth daily.     betamethasone  dipropionate 0.05 % cream Apply topically 2 (two) times daily. 30 g 0   EPINEPHrine  (EPIPEN  2-PAK) 0.3 mg/0.3 mL IJ SOAJ injection Inject 0.3 mg into the muscle as needed for anaphylaxis. 2 each 0   albuterol  (VENTOLIN  HFA) 108 (90 Base) MCG/ACT inhaler INHALE 1 - 2 PUFFS INTO THE LUNGS EVERY 6 HOURS AS NEEDED FOR WHEEZING OR SHORTNESS OF BREATH 8.5 g 2   No facility-administered medications prior to visit.     Allergies  Allergen Reactions   Aspirin     Upset stomach   Cymbalta  [Duloxetine  Hcl] Swelling    Swollen lips    Prozac [Fluoxetine Hcl] Swelling    Lip swelling   Wellbutrin  [Bupropion ] Swelling    Lip swelling    ROS See HPI    Objective:     Physical Exam Constitutional:      General: She is not in acute distress.    Appearance: Normal appearance. She is well-developed.  HENT:     Head: Normocephalic and atraumatic.     Right Ear: External ear normal.     Left Ear: External ear normal.  Eyes:     General: No scleral icterus. Neck:     Thyroid : No thyromegaly.  Cardiovascular:     Rate and Rhythm: Normal rate and regular rhythm.     Heart sounds: Normal heart sounds. No murmur heard. Pulmonary:     Effort: Pulmonary effort is normal. No respiratory distress.     Breath sounds: Normal breath sounds. No wheezing.  Musculoskeletal:     Cervical back: Neck supple.  Skin:    General: Skin is warm and dry.  Neurological:     Mental Status: She is alert and oriented to person, place, and time.  Psychiatric:        Mood and Affect: Mood normal.        Behavior: Behavior normal.        Thought Content: Thought content normal.        Judgment: Judgment normal.      BP 112/68 (BP Location: Right Arm, Patient Position: Sitting, Cuff Size: Small)   Pulse 96   Temp 98.2 F (36.8 C) (Oral)   Resp 16   Ht 5\' 8"  (1.727 m)   Wt 141 lb (64  kg)   SpO2 98%   BMI 21.44 kg/m  Wt Readings from Last 3 Encounters:  04/02/24 141 lb (64 kg)  12/21/23 144 lb (65.3 kg)  10/27/23 148 lb (67.1 kg)       Assessment & Plan:   Problem List Items Addressed This Visit       Unprioritized   Neuropathy   Notes that symptoms come and go.  She feels like sometimes it contributes to falls. Monitor.        Relevant Orders   B12 (Completed)   Iron deficiency anemia due to chronic blood loss - Primary   Lab Results  Component Value Date   WBC 7.9 08/14/2023   HGB 13.4  08/14/2023   HCT 42.0 08/14/2023   MCV 73.3 (L) 08/14/2023   PLT 173 08/14/2023         Relevant Orders   CBC w/Diff (Completed)   Hyperlipidemia   Lab Results  Component Value Date   CHOL 120 08/08/2023   HDL 44.60 08/08/2023   LDLCALC 54 08/08/2023   LDLDIRECT 75.0 06/17/2022   TRIG 111.0 08/08/2023   CHOLHDL 3 08/08/2023   Not currently on statin. Lipid panel stable.       Relevant Medications   EPINEPHrine  (EPIPEN  2-PAK) 0.3 mg/0.3 mL IJ SOAJ injection   GERD (gastroesophageal reflux disease)   Stable without daily medication.       COPD with emphysema, Gold B   Uses wixela one to two times a day.  Reports symptoms are stable.       Borderline diabetes   Lab Results  Component Value Date   HGBA1C 6.0 08/08/2023   Update A1C.  Continue DM diet efforts.       Relevant Orders   HgB A1c (Completed)   Basic Metabolic Panel (BMET) (Completed)   Bite, insect   Relevant Medications   betamethasone  dipropionate 0.05 % cream   EPINEPHrine  (EPIPEN  2-PAK) 0.3 mg/0.3 mL IJ SOAJ injection   Allergy to insect bites causes local swelling, risk of throat swelling. Reactions occur mainly in summer outdoors. - Send EpiPen  prescription to [facility]. - Send prescription for cream for insect bites to in-house pharmacy. - She is advised to call 911 if she needs to use epipen .   I am having Tannia L. Bartosiewicz maintain her multivitamin, vitamin E, TURMERIC PO, Calcium -Vitamin D, albuterol , nicotine , oxyCODONE -acetaminophen , fluticasone -salmeterol, oxyCODONE -acetaminophen , atorvastatin , oxyCODONE -acetaminophen , ALPRAZolam , Trintellix , oxyCODONE -acetaminophen , betamethasone  dipropionate, and EPINEPHrine .  Meds ordered this encounter  Medications   betamethasone  dipropionate 0.05 % cream    Sig: Apply topically 2 (two) times daily.    Dispense:  30 g    Refill:  0   EPINEPHrine  (EPIPEN  2-PAK) 0.3 mg/0.3 mL IJ SOAJ injection    Sig: Inject 0.3 mg into the muscle as needed for  anaphylaxis.    Dispense:  2 each    Refill:  0    Supervising Provider:   Randie Bustle A [[id number]]

## 2024-04-02 NOTE — Assessment & Plan Note (Signed)
 Lab Results  Component Value Date   WBC 7.9 08/14/2023   HGB 13.4 08/14/2023   HCT 42.0 08/14/2023   MCV 73.3 (L) 08/14/2023   PLT 173 08/14/2023

## 2024-04-02 NOTE — Assessment & Plan Note (Signed)
 Notes that symptoms come and go.  She feels like sometimes it contributes to falls. Monitor.

## 2024-04-03 ENCOUNTER — Ambulatory Visit: Payer: Self-pay | Admitting: Family

## 2024-04-03 DIAGNOSIS — W57XXXA Bitten or stung by nonvenomous insect and other nonvenomous arthropods, initial encounter: Secondary | ICD-10-CM | POA: Insufficient documentation

## 2024-04-03 LAB — BASIC METABOLIC PANEL WITH GFR
BUN: 12 mg/dL (ref 6–23)
CO2: 22 meq/L (ref 19–32)
Calcium: 8.8 mg/dL (ref 8.4–10.5)
Chloride: 103 meq/L (ref 96–112)
Creatinine, Ser: 0.84 mg/dL (ref 0.40–1.20)
GFR: 71.08 mL/min (ref >60.00)
Glucose, Bld: 214 mg/dL — ABNORMAL HIGH (ref 70–99)
Potassium: 3.7 meq/L (ref 3.5–5.1)
Sodium: 138 meq/L (ref 135–145)

## 2024-04-03 NOTE — Patient Instructions (Addendum)
 VISIT SUMMARY:  Today, you had a routine follow-up visit to discuss several ongoing health issues, including neuropathy, borderline diabetes, asthma, back pain, and allergies to insect bites.  YOUR PLAN:  NEUROPATHY: You have intermittent neuropathy in your feet, likely due to previous chemotherapy, which increases your risk of falls. -Continue using home rails to prevent falls.  BORDERLINE DIABETES: Your previous A1c was in the borderline range, and your daily consumption of [facility] may be affecting your glucose levels. -We will order an A1c test to monitor your glucose levels.  ASTHMA: Your asthma is managed with Wixela and an as neededalbuterol inhaler, but you often forget the second dose of Wixela. Environmental factors like mold may contribute to your symptoms. -Try to remember to use your albuterol  inhaler twice daily as recommended.  BACK PAIN: Your back pain varies with activity and is managed with Percocet prescribed by a pain clinic. -Continue managing your back pain with Percocet as prescribed.  ALLERGY TO INSECT BITES: You experience local swelling from insect bites, with a risk of throat swelling. These reactions mainly occur in the summer when you are outdoors. -We will send a prescription for an EpiPen  to [facility]. -We will send a prescription for a cream for insect bites to the in-house pharmacy.  GENERAL HEALTH MAINTENANCE: Your routine health maintenance is up to date. Your last colonoscopy was last year, and the next one is due in 2029. Your previous blood count and liver function tests were normal. -We will order blood count, B12, and kidney function tests.

## 2024-04-04 ENCOUNTER — Other Ambulatory Visit: Payer: Self-pay | Admitting: Hematology & Oncology

## 2024-04-04 DIAGNOSIS — C180 Malignant neoplasm of cecum: Secondary | ICD-10-CM

## 2024-04-04 DIAGNOSIS — G629 Polyneuropathy, unspecified: Secondary | ICD-10-CM

## 2024-04-04 DIAGNOSIS — J430 Unilateral pulmonary emphysema [MacLeod's syndrome]: Secondary | ICD-10-CM

## 2024-04-22 ENCOUNTER — Other Ambulatory Visit: Payer: Self-pay | Admitting: Family

## 2024-04-23 ENCOUNTER — Other Ambulatory Visit: Payer: Self-pay | Admitting: Family

## 2024-04-26 ENCOUNTER — Other Ambulatory Visit (HOSPITAL_COMMUNITY): Payer: Self-pay

## 2024-04-26 DIAGNOSIS — R718 Other abnormality of red blood cells: Secondary | ICD-10-CM | POA: Diagnosis not present

## 2024-04-26 DIAGNOSIS — Z6821 Body mass index (BMI) 21.0-21.9, adult: Secondary | ICD-10-CM | POA: Diagnosis not present

## 2024-04-26 DIAGNOSIS — M545 Low back pain, unspecified: Secondary | ICD-10-CM | POA: Diagnosis not present

## 2024-04-26 DIAGNOSIS — Z789 Other specified health status: Secondary | ICD-10-CM | POA: Diagnosis not present

## 2024-04-26 DIAGNOSIS — F17210 Nicotine dependence, cigarettes, uncomplicated: Secondary | ICD-10-CM | POA: Diagnosis not present

## 2024-04-26 DIAGNOSIS — R7303 Prediabetes: Secondary | ICD-10-CM | POA: Diagnosis not present

## 2024-04-26 MED ORDER — OXYCODONE-ACETAMINOPHEN 10-325 MG PO TABS
0.5000 | ORAL_TABLET | Freq: Four times a day (QID) | ORAL | 0 refills | Status: DC
  Filled 2024-04-26: qty 21, 6d supply, fill #0

## 2024-04-27 ENCOUNTER — Other Ambulatory Visit (HOSPITAL_COMMUNITY): Payer: Self-pay

## 2024-04-29 ENCOUNTER — Other Ambulatory Visit (HOSPITAL_COMMUNITY): Payer: Self-pay

## 2024-04-30 ENCOUNTER — Other Ambulatory Visit (HOSPITAL_COMMUNITY): Payer: Self-pay

## 2024-04-30 DIAGNOSIS — Z79899 Other long term (current) drug therapy: Secondary | ICD-10-CM | POA: Diagnosis not present

## 2024-04-30 DIAGNOSIS — R7303 Prediabetes: Secondary | ICD-10-CM | POA: Diagnosis not present

## 2024-04-30 DIAGNOSIS — R718 Other abnormality of red blood cells: Secondary | ICD-10-CM | POA: Diagnosis not present

## 2024-04-30 DIAGNOSIS — Z6821 Body mass index (BMI) 21.0-21.9, adult: Secondary | ICD-10-CM | POA: Diagnosis not present

## 2024-04-30 DIAGNOSIS — F17210 Nicotine dependence, cigarettes, uncomplicated: Secondary | ICD-10-CM | POA: Diagnosis not present

## 2024-04-30 DIAGNOSIS — Z789 Other specified health status: Secondary | ICD-10-CM | POA: Diagnosis not present

## 2024-04-30 DIAGNOSIS — M545 Low back pain, unspecified: Secondary | ICD-10-CM | POA: Diagnosis not present

## 2024-04-30 MED ORDER — OXYCODONE-ACETAMINOPHEN 10-325 MG PO TABS
0.5000 | ORAL_TABLET | Freq: Four times a day (QID) | ORAL | 0 refills | Status: AC | PRN
  Filled 2024-04-30: qty 105, 30d supply, fill #0

## 2024-05-07 ENCOUNTER — Other Ambulatory Visit: Payer: Self-pay | Admitting: Hematology & Oncology

## 2024-05-07 DIAGNOSIS — G629 Polyneuropathy, unspecified: Secondary | ICD-10-CM

## 2024-05-07 DIAGNOSIS — J430 Unilateral pulmonary emphysema [MacLeod's syndrome]: Secondary | ICD-10-CM

## 2024-05-07 DIAGNOSIS — C180 Malignant neoplasm of cecum: Secondary | ICD-10-CM

## 2024-05-30 ENCOUNTER — Other Ambulatory Visit (HOSPITAL_COMMUNITY): Payer: Self-pay

## 2024-05-30 DIAGNOSIS — Z6821 Body mass index (BMI) 21.0-21.9, adult: Secondary | ICD-10-CM | POA: Diagnosis not present

## 2024-05-30 DIAGNOSIS — R718 Other abnormality of red blood cells: Secondary | ICD-10-CM | POA: Diagnosis not present

## 2024-05-30 DIAGNOSIS — Z789 Other specified health status: Secondary | ICD-10-CM | POA: Diagnosis not present

## 2024-05-30 DIAGNOSIS — M545 Low back pain, unspecified: Secondary | ICD-10-CM | POA: Diagnosis not present

## 2024-05-30 DIAGNOSIS — F17210 Nicotine dependence, cigarettes, uncomplicated: Secondary | ICD-10-CM | POA: Diagnosis not present

## 2024-05-30 DIAGNOSIS — R7303 Prediabetes: Secondary | ICD-10-CM | POA: Diagnosis not present

## 2024-05-30 MED ORDER — OXYCODONE-ACETAMINOPHEN 10-325 MG PO TABS
0.5000 | ORAL_TABLET | Freq: Four times a day (QID) | ORAL | 0 refills | Status: DC
  Filled 2024-05-30: qty 105, 27d supply, fill #0

## 2024-06-03 ENCOUNTER — Other Ambulatory Visit: Payer: Self-pay | Admitting: Hematology & Oncology

## 2024-06-03 DIAGNOSIS — J430 Unilateral pulmonary emphysema [MacLeod's syndrome]: Secondary | ICD-10-CM

## 2024-06-03 DIAGNOSIS — G629 Polyneuropathy, unspecified: Secondary | ICD-10-CM

## 2024-06-03 DIAGNOSIS — C180 Malignant neoplasm of cecum: Secondary | ICD-10-CM

## 2024-06-28 ENCOUNTER — Other Ambulatory Visit (HOSPITAL_COMMUNITY): Payer: Self-pay

## 2024-06-28 DIAGNOSIS — Z789 Other specified health status: Secondary | ICD-10-CM | POA: Diagnosis not present

## 2024-06-28 DIAGNOSIS — R7303 Prediabetes: Secondary | ICD-10-CM | POA: Diagnosis not present

## 2024-06-28 DIAGNOSIS — Z6821 Body mass index (BMI) 21.0-21.9, adult: Secondary | ICD-10-CM | POA: Diagnosis not present

## 2024-06-28 DIAGNOSIS — Z1231 Encounter for screening mammogram for malignant neoplasm of breast: Secondary | ICD-10-CM | POA: Diagnosis not present

## 2024-06-28 DIAGNOSIS — R718 Other abnormality of red blood cells: Secondary | ICD-10-CM | POA: Diagnosis not present

## 2024-06-28 DIAGNOSIS — F17210 Nicotine dependence, cigarettes, uncomplicated: Secondary | ICD-10-CM | POA: Diagnosis not present

## 2024-06-28 DIAGNOSIS — M545 Low back pain, unspecified: Secondary | ICD-10-CM | POA: Diagnosis not present

## 2024-06-28 DIAGNOSIS — Z79899 Other long term (current) drug therapy: Secondary | ICD-10-CM | POA: Diagnosis not present

## 2024-06-28 MED ORDER — OXYCODONE-ACETAMINOPHEN 10-325 MG PO TABS
1.0000 | ORAL_TABLET | ORAL | 0 refills | Status: AC
  Filled 2024-06-28: qty 105, 30d supply, fill #0

## 2024-07-04 ENCOUNTER — Other Ambulatory Visit: Payer: Self-pay | Admitting: Hematology & Oncology

## 2024-07-04 DIAGNOSIS — G629 Polyneuropathy, unspecified: Secondary | ICD-10-CM

## 2024-07-04 DIAGNOSIS — C180 Malignant neoplasm of cecum: Secondary | ICD-10-CM

## 2024-07-04 DIAGNOSIS — J430 Unilateral pulmonary emphysema [MacLeod's syndrome]: Secondary | ICD-10-CM

## 2024-07-19 ENCOUNTER — Other Ambulatory Visit: Payer: Self-pay | Admitting: Family

## 2024-07-24 ENCOUNTER — Other Ambulatory Visit (HOSPITAL_BASED_OUTPATIENT_CLINIC_OR_DEPARTMENT_OTHER): Payer: Self-pay | Admitting: Family

## 2024-07-24 DIAGNOSIS — Z139 Encounter for screening, unspecified: Secondary | ICD-10-CM

## 2024-07-26 NOTE — Telephone Encounter (Signed)
 Copied from CRM #[id number]. Topic: Clinical - Prescription Issue >> Jul 25, 2024  2:56 PM Macario HERO wrote: Reason for CRM: Patient called and said she is completely out of her medication:atorvastatin  (LIPITOR) 20 MG tablet [[id number]] and is afraid she won't receive it until next week. >> Jul 26, 2024 11:43 AM Rosaria A wrote: Pulled call. Pharmacy is  [id number] [place], [place] - [phone] AT [facility] & [address]  [address] [place] [address]  Phone: [phone] Fax: [phone]  Hours: Not open 24 hours   >> Jul 25, 2024  3:16 PM CMA Lila C wrote: Error CRM sent, which pharmacy does Pt want refill sent to?

## 2024-07-29 ENCOUNTER — Other Ambulatory Visit (HOSPITAL_COMMUNITY): Payer: Self-pay

## 2024-07-29 ENCOUNTER — Other Ambulatory Visit: Payer: Self-pay | Admitting: Family

## 2024-07-29 DIAGNOSIS — R718 Other abnormality of red blood cells: Secondary | ICD-10-CM | POA: Diagnosis not present

## 2024-07-29 DIAGNOSIS — M545 Low back pain, unspecified: Secondary | ICD-10-CM | POA: Diagnosis not present

## 2024-07-29 DIAGNOSIS — F17210 Nicotine dependence, cigarettes, uncomplicated: Secondary | ICD-10-CM | POA: Diagnosis not present

## 2024-07-29 DIAGNOSIS — Z789 Other specified health status: Secondary | ICD-10-CM | POA: Diagnosis not present

## 2024-07-29 DIAGNOSIS — M1990 Unspecified osteoarthritis, unspecified site: Secondary | ICD-10-CM | POA: Diagnosis not present

## 2024-07-29 DIAGNOSIS — R7303 Prediabetes: Secondary | ICD-10-CM | POA: Diagnosis not present

## 2024-07-29 DIAGNOSIS — M419 Scoliosis, unspecified: Secondary | ICD-10-CM | POA: Diagnosis not present

## 2024-07-29 DIAGNOSIS — Z6822 Body mass index (BMI) 22.0-22.9, adult: Secondary | ICD-10-CM | POA: Diagnosis not present

## 2024-07-29 MED ORDER — OXYCODONE-ACETAMINOPHEN 10-325 MG PO TABS
ORAL_TABLET | ORAL | 0 refills | Status: AC
  Filled 2024-07-29: qty 100, 30d supply, fill #0

## 2024-07-29 NOTE — Telephone Encounter (Signed)
 Requesting: oxycodone  10-325mg   Contract: None UDS: None Last Visit: 04/02/24 Next Visit:10/02/24 Last Refill: 06/28/24 #105 and 0RF   Please Advise

## 2024-08-05 ENCOUNTER — Other Ambulatory Visit: Payer: Self-pay | Admitting: Hematology & Oncology

## 2024-08-05 DIAGNOSIS — G629 Polyneuropathy, unspecified: Secondary | ICD-10-CM

## 2024-08-05 DIAGNOSIS — J430 Unilateral pulmonary emphysema [MacLeod's syndrome]: Secondary | ICD-10-CM

## 2024-08-05 DIAGNOSIS — C180 Malignant neoplasm of cecum: Secondary | ICD-10-CM

## 2024-08-15 ENCOUNTER — Encounter (HOSPITAL_BASED_OUTPATIENT_CLINIC_OR_DEPARTMENT_OTHER): Payer: Self-pay

## 2024-08-15 ENCOUNTER — Ambulatory Visit (HOSPITAL_BASED_OUTPATIENT_CLINIC_OR_DEPARTMENT_OTHER)
Admission: RE | Admit: 2024-08-15 | Discharge: 2024-08-15 | Disposition: A | Discharge status: Home / Self Care | Source: Ambulatory Visit | Attending: Family | Admitting: Family

## 2024-08-15 DIAGNOSIS — Z139 Encounter for screening, unspecified: Secondary | ICD-10-CM | POA: Diagnosis present

## 2024-08-15 DIAGNOSIS — Z1231 Encounter for screening mammogram for malignant neoplasm of breast: Secondary | ICD-10-CM | POA: Insufficient documentation

## 2024-08-20 ENCOUNTER — Ambulatory Visit: Payer: Self-pay | Admitting: Family

## 2024-08-28 ENCOUNTER — Other Ambulatory Visit (HOSPITAL_COMMUNITY): Payer: Self-pay

## 2024-08-28 DIAGNOSIS — R718 Other abnormality of red blood cells: Secondary | ICD-10-CM | POA: Diagnosis not present

## 2024-08-28 DIAGNOSIS — R7303 Prediabetes: Secondary | ICD-10-CM | POA: Diagnosis not present

## 2024-08-28 DIAGNOSIS — Z6822 Body mass index (BMI) 22.0-22.9, adult: Secondary | ICD-10-CM | POA: Diagnosis not present

## 2024-08-28 DIAGNOSIS — M545 Low back pain, unspecified: Secondary | ICD-10-CM | POA: Diagnosis not present

## 2024-08-28 DIAGNOSIS — F17210 Nicotine dependence, cigarettes, uncomplicated: Secondary | ICD-10-CM | POA: Diagnosis not present

## 2024-08-28 DIAGNOSIS — Z789 Other specified health status: Secondary | ICD-10-CM | POA: Diagnosis not present

## 2024-08-28 DIAGNOSIS — M1380 Other specified arthritis, unspecified site: Secondary | ICD-10-CM | POA: Diagnosis not present

## 2024-08-28 DIAGNOSIS — Z87891 Personal history of nicotine dependence: Secondary | ICD-10-CM | POA: Diagnosis not present

## 2024-08-28 MED ORDER — OXYCODONE-ACETAMINOPHEN 10-325 MG PO TABS
0.5000 | ORAL_TABLET | Freq: Four times a day (QID) | ORAL | 0 refills | Status: DC | PRN
  Filled 2024-08-28: qty 100, 30d supply, fill #0

## 2024-09-02 ENCOUNTER — Other Ambulatory Visit: Payer: Self-pay | Admitting: Hematology & Oncology

## 2024-09-02 DIAGNOSIS — C180 Malignant neoplasm of cecum: Secondary | ICD-10-CM

## 2024-09-02 DIAGNOSIS — G629 Polyneuropathy, unspecified: Secondary | ICD-10-CM

## 2024-09-02 DIAGNOSIS — J430 Unilateral pulmonary emphysema [MacLeod's syndrome]: Secondary | ICD-10-CM

## 2024-09-14 ENCOUNTER — Other Ambulatory Visit: Payer: Self-pay | Admitting: Family

## 2024-09-27 ENCOUNTER — Other Ambulatory Visit (HOSPITAL_COMMUNITY): Payer: Self-pay

## 2024-09-27 MED ORDER — OXYCODONE-ACETAMINOPHEN 10-325 MG PO TABS
1.0000 | ORAL_TABLET | ORAL | 0 refills | Status: AC | PRN
  Filled 2024-09-27: qty 90, 30d supply, fill #0

## 2024-10-02 ENCOUNTER — Other Ambulatory Visit: Payer: Self-pay | Admitting: Hematology & Oncology

## 2024-10-02 ENCOUNTER — Ambulatory Visit: Admitting: Family

## 2024-10-02 ENCOUNTER — Encounter: Payer: Self-pay | Admitting: Family

## 2024-10-02 VITALS — BP 111/54 | HR 86 | Temp 98.4°F | Resp 16 | Ht 68.0 in | Wt 141.0 lb

## 2024-10-02 DIAGNOSIS — D500 Iron deficiency anemia secondary to blood loss (chronic): Secondary | ICD-10-CM

## 2024-10-02 DIAGNOSIS — Z780 Asymptomatic menopausal state: Secondary | ICD-10-CM | POA: Diagnosis not present

## 2024-10-02 DIAGNOSIS — Z0000 Encounter for general adult medical examination without abnormal findings: Secondary | ICD-10-CM | POA: Diagnosis not present

## 2024-10-02 DIAGNOSIS — K219 Gastro-esophageal reflux disease without esophagitis: Secondary | ICD-10-CM

## 2024-10-02 DIAGNOSIS — G629 Polyneuropathy, unspecified: Secondary | ICD-10-CM | POA: Diagnosis not present

## 2024-10-02 DIAGNOSIS — E785 Hyperlipidemia, unspecified: Secondary | ICD-10-CM

## 2024-10-02 DIAGNOSIS — N950 Postmenopausal bleeding: Secondary | ICD-10-CM

## 2024-10-02 DIAGNOSIS — R739 Hyperglycemia, unspecified: Secondary | ICD-10-CM

## 2024-10-02 DIAGNOSIS — J439 Emphysema, unspecified: Secondary | ICD-10-CM

## 2024-10-02 DIAGNOSIS — Z87891 Personal history of nicotine dependence: Secondary | ICD-10-CM

## 2024-10-02 DIAGNOSIS — J430 Unilateral pulmonary emphysema [MacLeod's syndrome]: Secondary | ICD-10-CM

## 2024-10-02 DIAGNOSIS — C180 Malignant neoplasm of cecum: Secondary | ICD-10-CM

## 2024-10-02 NOTE — Assessment & Plan Note (Signed)
 No further bleeding since 2023 but pt never saw GYN. Agreeable to referral- referral placed.

## 2024-10-02 NOTE — Assessment & Plan Note (Signed)
 Patient quit smoking 3 months ago- commended her on this.

## 2024-10-02 NOTE — Assessment & Plan Note (Signed)
 On lipitor.  Lab Results  Component Value Date   CHOL 120 08/08/2023   HDL 44.60 08/08/2023   LDLCALC 54 08/08/2023   LDLDIRECT 75.0 06/17/2022   TRIG 111.0 08/08/2023   CHOLHDL 3 08/08/2023   Update lipid panel.

## 2024-10-02 NOTE — Assessment & Plan Note (Signed)
 Uses mylanta prn with good relief.

## 2024-10-02 NOTE — Assessment & Plan Note (Signed)
 Did try gabapentin  without improvement.

## 2024-10-02 NOTE — Assessment & Plan Note (Signed)
 Stable on advair  with rare use of albuterol .

## 2024-10-02 NOTE — Assessment & Plan Note (Signed)
 Continue healthy diet/exercise.  Colo/mammo up to date. Immunizations reviewed and up to date.

## 2024-10-02 NOTE — Assessment & Plan Note (Signed)
 Lab Results  Component Value Date   WBC 6.9 04/02/2024   HGB 13.2 04/02/2024   HCT 41.1 04/02/2024   MCV 73.7 (L) 04/02/2024   PLT 138.0 (L) 04/02/2024

## 2024-10-02 NOTE — Progress Notes (Signed)
 Subjective:     Patient ID: Teresa Morgan, female    DOB: 11-17-1955, 68 y.o.   MRN: [id number]  Chief Complaint  Patient presents with   Depression    Here for follow up    Hyperlipidemia    Here for follow up    Depression        Associated symptoms include no myalgias and no headaches. Hyperlipidemia Pertinent negatives include no myalgias.    Discussed the use of AI scribe software for clinical note transcription with the patient, who gave verbal consent to proceed.  History of Present Illness   Patient presents today for routine follow up. Reports mood is stable on trintellix .    Immunizations: up to date Diet: healthy Exercise: enjoys yard work/raking etc.  Colonoscopy: 12/24 due 12/29 Dexa: due Pap Smear: aged out Mammogram: 10/22 Vision: due Dental: up to date   Lab Results  Component Value Date   HGBA1C 6.1 04/02/2024     Health Maintenance Due  Topic Date Due   COVID-19 Vaccine (3 - Pfizer risk series) 10/02/2024    Past Medical History:  Diagnosis Date   Anemia    Anxiety    Arthritis    Borderline diabetes    Cataract    bilat removed   Colon cancer (HCC) 2013   cecal cancer   COPD with emphysema (HCC) 06/18/2012   Depression    GERD (gastroesophageal reflux disease)    Hyperlipidemia 05/27/2021   Neuromuscular disorder (HCC)    neuropathy from chemo   Peptic ulcer    Pneumonia    Shortness of breath dyspnea    with exertion    Past Surgical History:  Procedure Laterality Date   APPENDECTOMY  07/07/11   BREAST EXCISIONAL BIOPSY Right 03/15/2018   COLON RESECTION  2012   for colon cancer   COLONOSCOPY     EYE SURGERY Bilateral    cataract surgery with lens implant   heel spurs Right    hemrrhoids     PLANTAR FASCIA SURGERY Right 2009   PORT-A-CATH REMOVAL Left 04/19/2016   Procedure: REMOVAL PORT-A-CATH;  Surgeon: Jina Nephew, MD;  Location: [facility] OR;  Service: General;  Laterality: Left;   portacath     RADIOACTIVE SEED  GUIDED EXCISIONAL BREAST BIOPSY Right 03/15/2018   Procedure: RADIOACTIVE SEED GUIDED EXCISIONAL BREAST BIOPSY;  Surgeon: Nephew Jina, MD;  Location: [facility];  Service: General;  Laterality: Right;   TUBAL LIGATION  07/1980    Family History  Problem Relation Age of Onset   Diabetes Mother    Thalassemia Daughter    Colon cancer Cousin    Colon cancer Cousin    Colon polyps Neg Hx    Rectal cancer Neg Hx    Stomach cancer Neg Hx     Social History   Socioeconomic History   Marital status: Married    Spouse name: Not on file   Number of children: 3   Years of education: Not on file   Highest education level: 12th grade  Occupational History   Occupation: Marine Scientist: [employer]  Tobacco Use   Smoking status: Former    Current packs/day: 0.00    Average packs/day: 1 pack/day for 45.0 years (45.0 ttl pk-yrs)    Types: Cigarettes    Start date: 04/23/1986    Quit date: 09/04/2023    Years since quitting: 1.0   Smokeless tobacco: Never   Tobacco comments:  Quit smoking 1 week ago.  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   Married      Social Drivers of Corporate Investment Banker Strain: Patient Declined (09/26/2024)   Overall Financial Resource Strain (CARDIA)    Difficulty of Paying Living Expenses: Patient declined  Food Insecurity: Food Insecurity Present (09/26/2024)   Hunger Vital Sign    Worried About Running Out of Food in the Last Year: Sometimes true    Ran Out of Food in the Last Year: Patient declined  Transportation Needs: No Transportation Needs (09/26/2024)    PRAPARE - Administrator, Civil Service (Medical): No    Lack of Transportation (Non-Medical): No  Physical Activity: Sufficiently Active (09/26/2024)   Exercise Vital Sign    Days of Exercise per Week: 7 days    Minutes of Exercise per Session: 30 min  Stress: Stress Concern Present (09/26/2024)   [employer] of Occupational Health - Occupational Stress Questionnaire    Feeling of Stress: To some extent  Social Connections: Unknown (09/26/2024)   Social Connection and Isolation Panel    Frequency of Communication with Friends and Family: Twice a week    Frequency of Social Gatherings with Friends and Family: Patient declined    Attends Religious Services: More than 4 times per year    Active Member of [employer] or Organizations: No    Attends Engineer, Structural: Not on file    Marital Status: Married  Catering Manager Violence: Not At Risk (12/21/2023)   Humiliation, Afraid, Rape, and Kick questionnaire    Fear of Current or Ex-Partner: No    Emotionally Abused: No    Physically Abused: No    Sexually Abused: No    Outpatient Medications Prior to Visit  Medication Sig Dispense Refill   albuterol  (VENTOLIN  HFA) 108 (90 Base) MCG/ACT inhaler INHALE 1 - 2 PUFFS INTO THE LUNGS EVERY 6 HOURS AS NEEDED FOR WHEEZING OR SHORTNESS OF BREATH 8.5 g 2   ALPRAZolam  (XANAX ) 1 MG tablet TAKE 1 TABLET BY MOUTH EVERY DAY 30 tablet 0   atorvastatin  (LIPITOR) 20 MG tablet Take 1 tablet (20 mg total) by mouth daily. 90 tablet 0   betamethasone  dipropionate 0.05 % cream Apply topically 2 (two) times daily. 30 g 0   Calcium -Vitamin D 600-125 MG-UNIT TABS Take 1 tablet by mouth daily.     EPINEPHrine  (EPIPEN  2-PAK) 0.3 mg/0.3 mL IJ SOAJ injection Inject 0.3 mg into the muscle as needed for anaphylaxis. 2 each 0   fluticasone -salmeterol (WIXELA INHUB) 250-50 MCG/ACT AEPB Inhale 1 puff into the lungs in the morning and at bedtime.     Multiple Vitamin (MULTIVITAMIN) tablet Take 1  tablet by mouth daily.     nicotine  (NICOTROL ) 10 MG inhaler Inhale 1 Cartridge (1 continuous puffing total) into the lungs as needed for smoking cessation. 150 each 0   oxyCODONE -acetaminophen  (PERCOCET) 10-325 MG tablet Take 1 tablet by mouth 4 (four) times daily as needed. 120 tablet 0   oxyCODONE -acetaminophen  (PERCOCET) 10-325 MG tablet Take 1 tablet  by mouth 4 (four) times daily as needed. 110 tablet 0   oxyCODONE -acetaminophen  (PERCOCET) 10-325 MG tablet Take 1 tablet by mouth as directed. Take 0.5 to 1 tablet by mouth no more than four times daily. Max of 3.5 tablets daily 105 tablet 0   oxyCODONE -acetaminophen  (PERCOCET) 10-325 MG tablet Take 0.5-1 tablets by mouth no more than 4 (four) times daily as needed. Max 3.5 tablets by mouth daily 105 tablet 0   oxyCODONE -acetaminophen  (PERCOCET) 10-325 MG tablet Take 1/2 to 1 tablet no more than four times daily as directed for lower back pain. ** max daily dose 3 1/2 tablets) 105 tablet 0   oxyCODONE -acetaminophen  (PERCOCET) 10-325 MG tablet Take 1/2 to 1 tablet by mouth no more than 4 times daily as needed for low back pain 100 tablet 0   oxyCODONE -acetaminophen  (PERCOCET) 10-325 MG tablet Take 0.5 to 1 tablet by mouth no more than three times daily as needed for low back pain. 90 tablet 0   TURMERIC PO Take 1 tablet by mouth daily.     vitamin E 200 UNIT capsule Take 200 Units by mouth daily.     vortioxetine  HBr (TRINTELLIX ) 5 MG TABS tablet Take 1 tablet (5 mg total) by mouth daily. 90 tablet 0   No facility-administered medications prior to visit.    Allergies  Allergen Reactions   Aspirin     Upset stomach   Cymbalta  [Duloxetine  Hcl] Swelling    Swollen lips    Prozac [Fluoxetine Hcl] Swelling    Lip swelling   Wellbutrin  [Bupropion ] Swelling    Lip swelling    Review of Systems  Constitutional:  Negative for weight loss.  HENT:  Positive for congestion (seasonal). Negative for hearing loss.   Eyes:  Negative for blurred  vision.  Respiratory:  Negative for cough.   Cardiovascular:  Negative for leg swelling.  Gastrointestinal:  Negative for blood in stool, constipation and diarrhea.  Genitourinary:  Negative for dysuria and frequency.  Musculoskeletal:  Negative for joint pain and myalgias.  Skin:  Negative for rash.  Neurological:  Negative for headaches.       Neuropathy in  feet  Psychiatric/Behavioral:  Positive for depression (stable on trintellix ).        Objective:    Physical Exam   BP (!) 111/54 (BP Location: Right Arm, Patient Position: Sitting, Cuff Size: Normal)   Pulse 86   Temp 98.4 F (36.9 C) (Oral)   Resp 16   Ht 5' 8 (1.727 m)   Wt 141 lb (64 kg)   SpO2 96%   BMI 21.44 kg/m  Wt Readings from Last 3 Encounters:  10/02/24 141 lb (64 kg)  04/02/24 141 lb (64 kg)  12/21/23 144 lb (65.3 kg)   Physical Exam  Constitutional: She is oriented to person, place, and time. She appears well-developed and well-nourished. No distress.  HENT:  Head: Normocephalic and atraumatic.  Right Ear: Tympanic membrane and ear canal normal.  Left Ear: Tympanic membrane and ear canal normal.  Mouth/Throat: Oropharynx is clear and moist.  Eyes: Pupils are equal, round, and reactive to light. No scleral icterus.  Neck: Normal range of motion. No thyromegaly present.  Cardiovascular: Normal rate and regular rhythm.   No murmur heard. Pulmonary/Chest: Effort normal and breath sounds normal. No respiratory distress. He has no wheezes. She has no rales. She exhibits no tenderness.  Abdominal: Soft. Bowel sounds are normal. She exhibits no distension and no mass. There is no tenderness. There is  no rebound and no guarding.  Musculoskeletal: She exhibits no edema.  Lymphadenopathy:    She has no cervical adenopathy.  Neurological: She is alert and oriented to person, place, and time. She has normal patellar reflexes. She exhibits normal muscle tone. Coordination normal.  Skin: Skin is warm and dry.   Psychiatric: She has a normal mood and affect. Her behavior is normal. Judgment and thought content normal.  Breast/Pelvic: deferred         Assessment & Plan:       Assessment & Plan:   Problem List Items Addressed This Visit       Unprioritized   Preventative health care - Primary   Continue healthy diet/exercise.  Colo/mammo up to date. Immunizations reviewed and up to date.      Post-menopausal bleeding   No further bleeding since 2023 but pt never saw GYN. Agreeable to referral- referral placed.      Relevant Orders   Ambulatory referral to Obstetrics / Gynecology   Neuropathy   Did try gabapentin  without improvement.        Iron deficiency anemia due to chronic blood loss   Lab Results  Component Value Date   WBC 6.9 04/02/2024   HGB 13.2 04/02/2024   HCT 41.1 04/02/2024   MCV 73.7 (L) 04/02/2024   PLT 138.0 (L) 04/02/2024         Relevant Orders   CBC w/Diff   IBC panel   Hyperlipidemia   On lipitor.  Lab Results  Component Value Date   CHOL 120 08/08/2023   HDL 44.60 08/08/2023   LDLCALC 54 08/08/2023   LDLDIRECT 75.0 06/17/2022   TRIG 111.0 08/08/2023   CHOLHDL 3 08/08/2023   Update lipid panel.       History of tobacco abuse   Patient quit smoking 3 months ago- commended her on this.       GERD (gastroesophageal reflux disease)   Uses mylanta prn with good relief.       COPD with emphysema, Gold B   Stable on advair  with rare use of albuterol .      Other Visit Diagnoses       Postmenopausal estrogen deficiency       Relevant Orders   DG Bone Density     Hyperglycemia       Relevant Orders   HgB A1c   Comp Met (CMET)       I am having Bren L. Elsbury maintain her multivitamin, vitamin E, TURMERIC PO, Calcium -Vitamin D, albuterol , nicotine , oxyCODONE -acetaminophen , fluticasone -salmeterol, oxyCODONE -acetaminophen , oxyCODONE -acetaminophen , betamethasone  dipropionate, EPINEPHrine , oxyCODONE -acetaminophen ,  oxyCODONE -acetaminophen , atorvastatin , oxyCODONE -acetaminophen , ALPRAZolam , vortioxetine  HBr, and oxyCODONE -acetaminophen .  No orders of the defined types were placed in this encounter.

## 2024-10-03 ENCOUNTER — Ambulatory Visit: Payer: Self-pay | Admitting: Family

## 2024-10-03 LAB — COMPREHENSIVE METABOLIC PANEL WITH GFR
ALT: 17 U/L (ref 0–35)
AST: 19 U/L (ref 0–37)
Albumin: 4.2 g/dL (ref 3.5–5.2)
Alkaline Phosphatase: 65 U/L (ref 39–117)
BUN: 17 mg/dL (ref 6–23)
CO2: 26 meq/L (ref 19–32)
Calcium: 8.6 mg/dL (ref 8.4–10.5)
Chloride: 105 meq/L (ref 96–112)
Creatinine, Ser: 0.89 mg/dL (ref 0.40–1.20)
GFR: 66.08 mL/min (ref >60.00)
Glucose, Bld: 79 mg/dL (ref 70–99)
Potassium: 3.7 meq/L (ref 3.5–5.1)
Sodium: 140 meq/L (ref 135–145)
Total Bilirubin: 0.4 mg/dL (ref 0.2–1.2)
Total Protein: 6.7 g/dL (ref 6.0–8.3)

## 2024-10-03 LAB — CBC WITH DIFFERENTIAL/PLATELET
Basophils Absolute: 0.1 K/uL (ref 0.0–0.1)
Basophils Relative: 0.8 % (ref 0.0–3.0)
Eosinophils Absolute: 0.2 K/uL (ref 0.0–0.7)
Eosinophils Relative: 2.5 % (ref 0.0–5.0)
HCT: 41.2 % (ref 36.0–46.0)
Hemoglobin: 12.8 g/dL (ref 12.0–15.0)
Lymphocytes Relative: 27.2 % (ref 12.0–46.0)
Lymphs Abs: 1.9 K/uL (ref 0.7–4.0)
MCHC: 31 g/dL (ref 30.0–36.0)
MCV: 74.3 fl — ABNORMAL LOW (ref 78.0–100.0)
Monocytes Absolute: 0.3 K/uL (ref 0.1–1.0)
Monocytes Relative: 4.9 % (ref 3.0–12.0)
Neutro Abs: 4.5 K/uL (ref 1.4–7.7)
Neutrophils Relative %: 64.6 % (ref 43.0–77.0)
Platelets: 151 K/uL (ref 150.0–400.0)
RBC: 5.55 Mil/uL — ABNORMAL HIGH (ref 3.87–5.11)
RDW: 16.3 % — ABNORMAL HIGH (ref 11.5–15.5)
WBC: 6.9 K/uL (ref 4.0–10.5)

## 2024-10-03 LAB — IBC PANEL
Iron: 69 ug/dL (ref 42–145)
Saturation Ratios: 24.9 % (ref 20.0–50.0)
TIBC: 277.2 ug/dL (ref 250.0–450.0)
Transferrin: 198 mg/dL — ABNORMAL LOW (ref 212.0–360.0)

## 2024-10-03 LAB — HEMOGLOBIN A1C: Hgb A1c MFr Bld: 5.7 % (ref 4.6–6.5)

## 2024-10-22 ENCOUNTER — Other Ambulatory Visit: Payer: Self-pay | Admitting: Family

## 2024-10-25 ENCOUNTER — Other Ambulatory Visit (HOSPITAL_COMMUNITY): Payer: Self-pay

## 2024-10-25 MED ORDER — OXYCODONE-ACETAMINOPHEN 10-325 MG PO TABS
1.0000 | ORAL_TABLET | Freq: Three times a day (TID) | ORAL | 0 refills | Status: DC
  Filled 2024-10-25: qty 90, 30d supply, fill #0

## 2024-11-01 ENCOUNTER — Other Ambulatory Visit: Payer: Self-pay | Admitting: Hematology & Oncology

## 2024-11-01 DIAGNOSIS — J430 Unilateral pulmonary emphysema [MacLeod's syndrome]: Secondary | ICD-10-CM

## 2024-11-01 DIAGNOSIS — C180 Malignant neoplasm of cecum: Secondary | ICD-10-CM

## 2024-11-01 DIAGNOSIS — G629 Polyneuropathy, unspecified: Secondary | ICD-10-CM

## 2024-11-28 ENCOUNTER — Other Ambulatory Visit (HOSPITAL_COMMUNITY): Payer: Self-pay

## 2024-11-28 MED ORDER — OXYCODONE-ACETAMINOPHEN 10-325 MG PO TABS
1.0000 | ORAL_TABLET | Freq: Three times a day (TID) | ORAL | 0 refills | Status: AC
  Filled 2024-11-28: qty 21, 30d supply, fill #0

## 2024-12-01 ENCOUNTER — Other Ambulatory Visit: Payer: Self-pay | Admitting: Hematology & Oncology

## 2024-12-01 DIAGNOSIS — G629 Polyneuropathy, unspecified: Secondary | ICD-10-CM

## 2024-12-01 DIAGNOSIS — J430 Unilateral pulmonary emphysema [MacLeod's syndrome]: Secondary | ICD-10-CM

## 2024-12-01 DIAGNOSIS — C180 Malignant neoplasm of cecum: Secondary | ICD-10-CM

## 2024-12-05 ENCOUNTER — Other Ambulatory Visit (HOSPITAL_COMMUNITY): Payer: Self-pay

## 2024-12-05 MED ORDER — OXYCODONE-ACETAMINOPHEN 10-325 MG PO TABS
0.5000 | ORAL_TABLET | Freq: Three times a day (TID) | ORAL | 0 refills | Status: AC | PRN
  Filled 2024-12-05: qty 90, 30d supply, fill #0

## 2024-12-14 ENCOUNTER — Other Ambulatory Visit: Payer: Self-pay | Admitting: Family

## 2024-12-26 ENCOUNTER — Ambulatory Visit: Payer: Medicare Other

## 2025-04-04 ENCOUNTER — Ambulatory Visit: Admitting: Family
